# Patient Record
Sex: Female | Born: 1960 | Race: Black or African American | Hispanic: No | Smoking: Former smoker
Health system: Southern US, Community
[De-identification: ages and names within clinical notes are randomized; demographics above are authoritative.]

## PROBLEM LIST (undated history)

## (undated) DIAGNOSIS — E669 Obesity, unspecified: Secondary | ICD-10-CM

## (undated) DIAGNOSIS — T50995A Adverse effect of other drugs, medicaments and biological substances, initial encounter: Secondary | ICD-10-CM

## (undated) DIAGNOSIS — I10 Essential (primary) hypertension: Secondary | ICD-10-CM

## (undated) DIAGNOSIS — M1990 Unspecified osteoarthritis, unspecified site: Secondary | ICD-10-CM

## (undated) DIAGNOSIS — G4730 Sleep apnea, unspecified: Secondary | ICD-10-CM

## (undated) DIAGNOSIS — Z87891 Personal history of nicotine dependence: Secondary | ICD-10-CM

## (undated) DIAGNOSIS — E781 Pure hyperglyceridemia: Secondary | ICD-10-CM

## (undated) DIAGNOSIS — I214 Non-ST elevation (NSTEMI) myocardial infarction: Secondary | ICD-10-CM

## (undated) DIAGNOSIS — E039 Hypothyroidism, unspecified: Secondary | ICD-10-CM

## (undated) DIAGNOSIS — E119 Type 2 diabetes mellitus without complications: Secondary | ICD-10-CM

## (undated) DIAGNOSIS — I2510 Atherosclerotic heart disease of native coronary artery without angina pectoris: Secondary | ICD-10-CM

## (undated) HISTORY — PX: TUBAL LIGATION: SHX77

## (undated) HISTORY — PX: VESICOVAGINAL FISTULA CLOSURE W/ TAH: SUR271

## (undated) HISTORY — DX: Sleep apnea, unspecified: G47.30

## (undated) HISTORY — DX: Essential (primary) hypertension: I10

---

## 1997-05-18 ENCOUNTER — Other Ambulatory Visit: Admission: RE | Admit: 1997-05-18 | Discharge: 1997-05-18 | Payer: Self-pay | Admitting: *Deleted

## 1997-05-26 ENCOUNTER — Ambulatory Visit (HOSPITAL_COMMUNITY): Admission: RE | Admit: 1997-05-26 | Discharge: 1997-05-26 | Payer: Self-pay | Admitting: *Deleted

## 1997-12-20 ENCOUNTER — Emergency Department (HOSPITAL_COMMUNITY): Admission: EM | Admit: 1997-12-20 | Discharge: 1997-12-20 | Payer: Self-pay | Admitting: Emergency Medicine

## 2000-01-17 ENCOUNTER — Emergency Department (HOSPITAL_COMMUNITY): Admission: EM | Admit: 2000-01-17 | Discharge: 2000-01-17 | Payer: Self-pay | Admitting: *Deleted

## 2001-01-12 ENCOUNTER — Emergency Department (HOSPITAL_COMMUNITY): Admission: EM | Admit: 2001-01-12 | Discharge: 2001-01-12 | Payer: Self-pay

## 2002-02-14 ENCOUNTER — Emergency Department (HOSPITAL_COMMUNITY): Admission: EM | Admit: 2002-02-14 | Discharge: 2002-02-14 | Payer: Self-pay | Admitting: Emergency Medicine

## 2002-09-10 ENCOUNTER — Emergency Department (HOSPITAL_COMMUNITY): Admission: EM | Admit: 2002-09-10 | Discharge: 2002-09-11 | Payer: Self-pay | Admitting: Emergency Medicine

## 2003-08-19 ENCOUNTER — Inpatient Hospital Stay (HOSPITAL_COMMUNITY): Admission: EM | Admit: 2003-08-19 | Discharge: 2003-08-26 | Payer: Self-pay | Admitting: Obstetrics and Gynecology

## 2003-08-19 ENCOUNTER — Encounter: Payer: Self-pay | Admitting: Emergency Medicine

## 2003-08-20 ENCOUNTER — Encounter (INDEPENDENT_AMBULATORY_CARE_PROVIDER_SITE_OTHER): Payer: Self-pay | Admitting: Specialist

## 2003-09-01 ENCOUNTER — Ambulatory Visit (HOSPITAL_COMMUNITY): Admission: RE | Admit: 2003-09-01 | Discharge: 2003-09-01 | Payer: Self-pay | Admitting: Obstetrics and Gynecology

## 2003-09-15 ENCOUNTER — Ambulatory Visit (HOSPITAL_COMMUNITY): Admission: RE | Admit: 2003-09-15 | Discharge: 2003-09-15 | Payer: Self-pay | Admitting: Obstetrics and Gynecology

## 2003-10-17 ENCOUNTER — Encounter (INDEPENDENT_AMBULATORY_CARE_PROVIDER_SITE_OTHER): Payer: Self-pay | Admitting: Specialist

## 2003-10-17 ENCOUNTER — Inpatient Hospital Stay (HOSPITAL_COMMUNITY): Admission: RE | Admit: 2003-10-17 | Discharge: 2003-10-20 | Payer: Self-pay | Admitting: Obstetrics and Gynecology

## 2003-10-21 ENCOUNTER — Observation Stay (HOSPITAL_COMMUNITY): Admission: AD | Admit: 2003-10-21 | Discharge: 2003-10-22 | Payer: Self-pay | Admitting: Obstetrics and Gynecology

## 2003-12-12 ENCOUNTER — Ambulatory Visit (HOSPITAL_COMMUNITY): Admission: RE | Admit: 2003-12-12 | Discharge: 2003-12-12 | Payer: Self-pay | Admitting: Obstetrics and Gynecology

## 2005-01-31 ENCOUNTER — Ambulatory Visit (HOSPITAL_COMMUNITY): Admission: RE | Admit: 2005-01-31 | Discharge: 2005-01-31 | Payer: Self-pay | Admitting: Obstetrics and Gynecology

## 2006-02-12 ENCOUNTER — Ambulatory Visit (HOSPITAL_COMMUNITY): Admission: RE | Admit: 2006-02-12 | Discharge: 2006-02-12 | Payer: Self-pay | Admitting: Internal Medicine

## 2006-02-21 ENCOUNTER — Encounter: Admission: RE | Admit: 2006-02-21 | Discharge: 2006-02-21 | Payer: Self-pay | Admitting: Internal Medicine

## 2007-02-05 HISTORY — PX: KNEE ARTHROSCOPY W/ MENISCAL REPAIR: SHX1877

## 2007-06-04 ENCOUNTER — Encounter: Admission: RE | Admit: 2007-06-04 | Discharge: 2007-06-04 | Payer: Self-pay | Admitting: Orthopedic Surgery

## 2007-06-08 ENCOUNTER — Ambulatory Visit (HOSPITAL_COMMUNITY): Admission: RE | Admit: 2007-06-08 | Discharge: 2007-06-08 | Payer: Self-pay | Admitting: Internal Medicine

## 2008-06-21 ENCOUNTER — Ambulatory Visit (HOSPITAL_COMMUNITY): Admission: RE | Admit: 2008-06-21 | Discharge: 2008-06-21 | Payer: Self-pay | Admitting: Internal Medicine

## 2009-06-28 ENCOUNTER — Ambulatory Visit (HOSPITAL_COMMUNITY): Admission: RE | Admit: 2009-06-28 | Discharge: 2009-06-28 | Payer: Self-pay | Admitting: Internal Medicine

## 2010-02-25 ENCOUNTER — Encounter: Payer: Self-pay | Admitting: Internal Medicine

## 2010-06-22 ENCOUNTER — Emergency Department (HOSPITAL_COMMUNITY): Payer: 59

## 2010-06-22 ENCOUNTER — Emergency Department (HOSPITAL_COMMUNITY)
Admission: EM | Admit: 2010-06-22 | Discharge: 2010-06-22 | Disposition: A | Discharge status: Home / Self Care | Payer: 59 | Attending: Emergency Medicine | Admitting: Emergency Medicine

## 2010-06-22 DIAGNOSIS — I10 Essential (primary) hypertension: Secondary | ICD-10-CM | POA: Insufficient documentation

## 2010-06-22 DIAGNOSIS — R42 Dizziness and giddiness: Secondary | ICD-10-CM | POA: Insufficient documentation

## 2010-06-22 DIAGNOSIS — R0602 Shortness of breath: Secondary | ICD-10-CM | POA: Insufficient documentation

## 2010-06-22 DIAGNOSIS — J189 Pneumonia, unspecified organism: Secondary | ICD-10-CM | POA: Insufficient documentation

## 2010-06-22 DIAGNOSIS — H538 Other visual disturbances: Secondary | ICD-10-CM | POA: Insufficient documentation

## 2010-06-22 DIAGNOSIS — R51 Headache: Secondary | ICD-10-CM | POA: Insufficient documentation

## 2010-06-22 LAB — URINALYSIS, ROUTINE W REFLEX MICROSCOPIC
Bilirubin Urine: NEGATIVE
Ketones, ur: NEGATIVE mg/dL
Leukocytes, UA: NEGATIVE
Nitrite: NEGATIVE
Protein, ur: NEGATIVE mg/dL
Specific Gravity, Urine: 1.025 (ref 1.005–1.030)
pH: 5.5 (ref 5.0–8.0)

## 2010-06-22 LAB — BASIC METABOLIC PANEL
Calcium: 9.4 mg/dL (ref 8.4–10.5)
Chloride: 101 mEq/L (ref 96–112)
Creatinine, Ser: 0.99 mg/dL (ref 0.4–1.2)
GFR calc Af Amer: >60 mL/min (ref >60)
GFR calc non Af Amer: 59 mL/min — ABNORMAL LOW (ref >60)
Glucose, Bld: 179 mg/dL — ABNORMAL HIGH (ref 70–99)
Potassium: 4.4 mEq/L (ref 3.5–5.1)

## 2010-06-22 LAB — CBC
HCT: 43.8 % (ref 36.0–46.0)
Hemoglobin: 14.5 g/dL (ref 12.0–15.0)
MCH: 28.7 pg (ref 26.0–34.0)
MCV: 86.6 fL (ref 78.0–100.0)
RBC: 5.06 MIL/uL (ref 3.87–5.11)

## 2010-06-22 LAB — POCT CARDIAC MARKERS

## 2010-06-22 LAB — DIFFERENTIAL
Basophils Relative: 0 % (ref 0–1)
Lymphs Abs: 3 10*3/uL (ref 0.7–4.0)
Monocytes Absolute: 1.3 10*3/uL — ABNORMAL HIGH (ref 0.1–1.0)
Monocytes Relative: 12 % (ref 3–12)

## 2010-06-22 LAB — URINE MICROSCOPIC-ADD ON

## 2010-06-22 MED ORDER — IOHEXOL 300 MG/ML  SOLN
100.0000 mL | Freq: Once | INTRAMUSCULAR | Status: AC | PRN
  Administered 2010-06-22: 100 mL via INTRAVENOUS

## 2010-06-22 NOTE — H&P (Signed)
[name], [name]                          ACCOUNT NO.:  [id number]   MEDICAL RECORD NO.:  [id number]                   PATIENT TYPE:  INP   LOCATION:  NA                                   FACILITY:  [facility]   PHYSICIAN:  Hal Morales, M.D.             DATE OF BIRTH:  04-15-1960   DATE OF ADMISSION:  DATE OF DISCHARGE:                                HISTORY & PHYSICAL   HISTORY OF PRESENT ILLNESS:  [name] is a 50 year old married African-  American female para 3-0-0-3 who presents for hysterectomy because of pelvic  pain and a persistent right adnexal abscess.  The patient entered the  service of [facility] through [facility]  emergency department when she was seen by Dr. Pennie Rushing on August 19, 2003 for a  right adnexal abscess.  This abscess which grew E. coli responded well to a  7-day course of Zosyn IV and a CT-directed abscess drainage (a pigtail drain  was left in place).  The patient's initial CT scan on August 19, 2003 showed a  6 x 8 cm abscess in her right adnexal region which was drained under CT  guidance.  A follow-up CT scan on September 01, 2003 showed no residual fluid or  pus in the right lower quadrant.  Additionally, a water-soluble contrast was  injected under fluoroscopy into her abscess cavity showing no communication  with the bowel, but rather suggested a tuboovarian etiology.  It was also  noted that a soft tissue density surrounding the indwelling pigtail catheter  was visible.  A final CT scan on September 15, 2003 confirmed that there was no  residual fluid collection or acute inflammatory process; therefore, the  drainage catheter was removed.  The patient remained asymptomatic until  approximately 2 weeks ago when she began experiencing what she described as  twinges of pain in her right lower quadrant.  The patient states that this  discomfort has now evolved into intermittent, daily, achy/sharp pain rated  at a 5/10 on a  10-point scale.  The patient does find relief, however, by  taking two Vicodin tablets and denies any nausea, vomiting, diarrhea, fever,  dyspareunia, urinary tract symptoms, or back pain associated with this  discomfort.  The patient does admit at today's visit to a heavy vaginal  discharge with odor and mild itching of 3-day duration.  During the  patient's September 14, 2003 office visit a 30-minute discussion was held with  the patient regarding the possibility of reaccumulation of her abscess and  that should that occur, a total abdominal hysterectomy with right salpingo-  oophorectomy and possibly bilateral salpingo-oophorectomy might be required.  Given the patient's recurring symptoms and her desire for definitive  therapy, she has consented to undergo hysterectomy.   PAST MEDICAL HISTORY:  1.  OB history:  Gravida 3, para 3-0-0-3.  The patient delivered vaginally  in 1985, 1983, and 1980.  She had no problems with her pregnancies.  2.  GYN history:  Menarche at 50 years old.  The patient's menstrual periods      are regular.  Her last menstrual period was August 20, 2003.  She uses      bilateral tubal ligation as her method of contraception.  Denies any      history of sexually-transmitted diseases or abnormal Pap smears.  Her      last normal Pap smear and mammogram were both in 2004.  3.  Medical history:  Febrile seizures as a child.  4.  Surgical history:  Hernia repair as an infant on two separate occasions.      In 1986 - bilateral tubal ligation (an open procedure as opposed to      laparoscopy due to a tubal abnormality per the patient).   FAMILY HISTORY:  Positive for diabetes mellitus and hypertension.   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   HABITS:  Tobacco:  The patient smokes three cigarettes per day.  Alcohol:  Occasionally.   CURRENT MEDICATIONS:  Hydrocodone one to two tablets q.4-6h. as  needed for  pain.   ALLERGIES:  CODEINE, which causes itching.   REVIEW OF SYSTEMS:  The patient wears glasses, vaginitis symptoms, late  menstrual periods, and otherwise negative except as mentioned in the history  of present illness.   PHYSICAL EXAMINATION:  GENERAL:  This is a morbidly-obese African-American  female in no acute distress.  VITAL SIGNS:  Blood pressure 130/78, weight is 336, height is 5 feet 8.5  inches tall.  NECK:  Supple.  There are no masses, thyromegaly, or lymphadenopathy.  HEART:  Regular rate and rhythm.  The patient does have a 1/6 systolic  ejection murmur at the left sternal border (asymptomatic).  LUNGS:  Clear to auscultation.  There are no wheezes, rales, or rhonchi.  BACK:  No CVA tenderness.  ABDOMEN:  Bowel sounds are present.  It is soft.  The patient does have  right lower quadrant tenderness without guarding or rebound.  EXTREMITIES:  Without clubbing, cyanosis, or edema.  PELVIC:  EG/BUS is within normal limits though the patient does have a 0.5  cm skin tag on her left vulvar area.  Vagina is normal with copious amounts  of thin, gray, foul-smelling vaginal discharge.  Cervix is without  tenderness or lesions.  Uterus unable to palpate secondary to increased BMI  though the patient does complain of tenderness with this exam.  Adnexa  without any palpable masses or tenderness.  Rectovaginal without masses or  tenderness.   LABORATORY DATA:  Urine pregnancy test is negative.  Wet prep shows pH is  5.5, positive whiff, positive for clue cells.   IMPRESSION:  1.  Pelvic pain.  2.  Right adnexal abscess.  3.  Bacterial vaginosis.   DISPOSITION:  A discussion was held with the patient regarding the  implications for her procedure along with its risks which include but are  not limited to:  Reaction to anesthesia, excessive bleeding, infection, damage to adjacent organs, and deep vein thrombosis due to the patient's  increased body mass index.   The patient was given metronidazole 500 mg one  tablet twice daily for 5 days prior to her surgical procedure for bacterial  vaginosis, along with alcohol precautions.  The patient has verbalized  understanding of the risks of  her procedure and has accepted them by  consenting to proceed with a total abdominal hysterectomy with possible  right salpingo-oophorectomy/bilateral salpingo-oophorectomy at [facility] on October 17, 2003 at 10:45 a.m.     [name].                    Hal Morales, M.D.    EJP/MEDQ  D:  10/13/2003  T:  10/13/2003  Job:  [id number]

## 2010-06-22 NOTE — Consult Note (Signed)
NAMECARMEL, GARFIELD                          ACCOUNT NO.:  [id number]   MEDICAL RECORD NO.:  [id number]                   PATIENT TYPE:  EMS   LOCATION:  ED                                   FACILITY:  [facility]   PHYSICIAN:  Ollen Gross. Vernell Morgans, M.D.              DATE OF BIRTH:  Jun 10, 1960   DATE OF CONSULTATION:  08/19/2003  DATE OF DISCHARGE:                                   CONSULTATION   Ms. Grams is a 50 year old black female who presents with right flank pain  that started on Tuesday.  Her pain was associated with fevers to 104 at  home.  Her pain eventually moved to the left lower quadrant and then to her  right lower quadrant.  The pain has been associated with nausea, but no  vomiting.  She did have a bowel movement yesterday that was normal, but none  today.  She is sexually active with her husband only.  Pain is never really  improved and she came to the emergency department for further evaluation.  She, otherwise, denies chest pain, shortness of breath, diarrhea, dysuria.  Rest of review of systems unremarkable.   PAST MEDICAL HISTORY:  None.   PAST SURGICAL HISTORY:  Significant for an open bilateral tubal ligation  that she states is because her right tube was abnormal.   MEDICATIONS:  None.   ALLERGIES:  CODEINE.   SOCIAL HISTORY:  She denies use of alcohol and smokes about five cigarettes  a day.   FAMILY HISTORY:  Noncontributory.   PHYSICAL EXAMINATION:  VITAL SIGNS:  Temperature 102.5, blood pressure  150/80, pulse 120.  GENERAL:  She is a well-developed, well-nourished black female in no acute  distress.  SKIN:  Warm and dry with no jaundice.  HEENT:  Eyes:  Her extraocular muscles intact.  Pupils are equal, round, and  reactive to light.  Sclerae nonicteric.  LUNGS:  Clear bilaterally with no use of accessory respiratory muscles.  HEART:  Regular rate and rhythm with an impulse in the left chest.  ABDOMEN:  Soft with some focal right lower quadrant  tenderness, but no  guarding or peritoneal signs.  No palpable mass, although she is morbidly  obese.  EXTREMITIES:  No clubbing, cyanosis, edema.  PSYCHOLOGIC:  Is alert and oriented x3 with no evidence of any  anxiety/depression.   LABORATORIES:  On review of her laboratory work it was significant for a  white count of 22,000.  On review of her CT scan with the radiologist she  has a complex cystic mass in her right adnexal area very closely associated  with her uterus.  The tip of the appendix is visualized above this and  appears normal, although the entire course of the appendix cannot be seen.  Ultrasound was also performed and was reviewed with the radiologist and was  significant for a complex right adnexal cystic mass with internal flow.  ASSESSMENT/PLAN:  This is a 49 year old black female with a complex cystic  right adnexal mass as well as elevated white count and temperature.  This  seems most worrisome, based on her studies, for a tubo ovarian abscess;  although this can be unusual in a married woman that has had a tubal  ligation in the past.  She is known to have an abnormal right tube based on  her history.  She certainly also could have appendicitis, although I also  think this is less likely given the fact that we can see part of the  appendix on the CT scan and it appears normal.  We will plan to consult GYN,  Dr. Pennie Rushing, to get their opinion and I agree with starting her on broad-  spectrum antibiotic therapy and we will be available to help with any  surgery should it become necessary and will follow closely with the GYNs.                                               Ollen Gross. Vernell Morgans, M.D.    PST/MEDQ  D:  08/19/2003  T:  08/19/2003  Job:  [id number]

## 2010-06-22 NOTE — Op Note (Signed)
[name], [name]                          ACCOUNT NO.:  [id number]   MEDICAL RECORD NO.:  [id number]                   PATIENT TYPE:  INP   LOCATION:  9320                                 FACILITY:  [facility]   PHYSICIAN:  Hal Morales, M.D.             DATE OF BIRTH:  08-30-1960   DATE OF PROCEDURE:  10/17/2003  DATE OF DISCHARGE:                                 OPERATIVE REPORT   PREOPERATIVE DIAGNOSES:  1.  Pelvic pain.  2.  Morbid obesity.  3.  Right tuboovarian complex, status post right tuboovarian abscess.  4.  Uterine fibroids.  5.  Pelvic adhesions.   POSTOPERATIVE DIAGNOSES:  1.  Pelvic pain.  2.  Morbid obesity.  3.  Right tuboovarian complex, status post right tuboovarian abscess.  4.  Uterine fibroids.  5.  Pelvic adhesions.   OPERATION:  1.  Total abdominal hysterectomy.  2.  Right salpingo-oophorectomy.  3.  Lysis of adhesions.   SURGEON:  Dr. Dierdre Forth   FIRST ASSISTANT:  Dr. Marsh Dolly   ANESTHESIA:  General orotracheal.   ESTIMATED BLOOD LOSS:  700 mL.   FLUIDS:  4900 mL of crystalloid.   URINE OUTPUT:  300 mL.   COMPLICATIONS:  None.   FINDINGS:  The patient weighed 342 pounds and is 5 feet 8 inches tall.  The  uterus was enlarged to 12 weeks' size with multiple subserosal uterine  fibroids and one pedunculated fibroid on the left anterior uterus measuring  approximately 2 cm.  The left ovary was within normal limits except for  minimal filmy adhesions.  The left tube was status post tubal sterilization.  The right tube and ovary were involved in an adhesive complex measuring 8 x  5 cm, containing clear fluid, and no purulent fluid was noted.  There were  numerous adhesions between this complex mass of the right pelvic sidewall.  There were also adhesions between the anterior abdominal wall and the  omentum.   SPECIMENS TO PATHOLOGY:  Uterus, cervix, and right tuboovarian complex.   DESCRIPTION OF PROCEDURE:  The patient  was taken to the operating room after  appropriate identification and placed on the operating table.  After the  attainment of adequate general anesthesia, with the patient in the supine  position, the abdomen was prepped with multiple layers of Betadine, and the  perineum and vagina were then prepped with multiple layers of Betadine.  A  Foley catheter was inserted into the bladder and connected to straight  drainage.  The abdomen was draped as a sterile field.  A midline incision  was made in the abdomen and the abdomen opened in layers.  The patient was  noted to have approximately 12 cm of subcutaneous tissue.  The fascia was  incised in the midline and that incision taken cephalad and caudad.  The  peritoneum was entered and that incision taken cephalad and caudad.  Lysis  of adhesions was carried out to remove some of the adhesions between the  anterior abdominal wall and the tuboovarian complex and the omentum.  After  this had been achieved, a self-retaining retractor could be placed and a  bladder blade placed.  The right adnexal region was noted to be markedly  adherent, and lysis of adhesions to allow the restoration of more  recognizable anatomy was undertaken.  The uterus was then grasped at each  cornual region with [facility] clamps and elevated into the operative field.  The  left round ligament was then isolated, suture ligated, and incised.  The  anterior leaf of the broad ligament was incised anteriorly toward the  bladder.  The uteroovarian ligament and upper pedicle were then identified,  clamped, cut, tied with a free tie, and suture ligated.  On the right side,  the round ligament could not easily be identified so with further sharp and  blunt dissection, an area consistent with the round ligament was elevated  and suture ligated and incised.  The upper pedicle and area in the position  of the uteroovarian ligament was then clamped, cut, and suture ligated.  This procedure  was repeated down to the level of the right uterine artery  being careful to maintain a position very close to the uterus.  The left  uterine artery was then skeletonized, clamped, cut, and suture ligated and  the right uterine artery clamped, cut, and suture ligated.  Further lysis of  adhesions allowed the right adnexal mass to be freed up from the right  uterine fundus.  The fundus of the uterus was then excised range of motion  the upper cervix and the fundus removed from the operative field.  The  paracervical tissues were clamped, cut, and suture ligated on either side.  The uterosacral ligaments on the right and left side were clamped, cut, and  suture ligated and those sutures held.  The vaginal angle sutures were then  clamped, cut, and suture ligated and the cervix excised from the upper  vagina.  The vaginal cuff was closed with figure-of-eight sutures of 0  Vicryl.  All sutures to this point had been 0 Vicryl.  Copious irrigation  was carried out and a hemostatic suture placed in the left vaginal cuff.  Attention was then turned to the right adnexa, where the right adnexal mass  was elevated into the operative field.  The sidewall peritoneum was then  incised and taken down sharply.  This allowed the mass to be isolated after  considerable lysis of adhesions.  Once the mass had been dissected off the  right pelvic sidewall, the ureter was visualized.  The dissection was then  taken back for the infundibulopelvic ligament which was subsequently  isolated, clamped, cut, tied with a free tie, and suture ligated.  The  portion of the right adnexal mass that was adherent more caudad was then  gently dissected off the right pelvic sidewall with sharp dissection, being  careful of the right ureter and was at its base clamped, cut, and suture  ligated.  Copious irrigation was carried out, and a hemostatic suture was required for what had been the caudad base of the right tuboovarian  complex.  It was then removed from the operative field, and the right pelvic sidewall  made hemostatic with a suture of 3-0 Vicryl.  Copious irrigation was carried  out, and the sutures holding the vaginal angle and uterosacrals on either  side were tied together.  An additional hemostatic suture of 0 Vicryl was  placed in the vaginal cuff and, again, copious irrigation carried out.  It  should be noted that prior to clamping the uterine artery, the bladder was  sharply dissected off the anterior cervix.  Once copious irrigation of  peritoneal cavity had occurred, the abdominal incision was closed in a  modified Smead-Jones fashion with 0 PDS from each apex to the midline and  tied in the midline.  This allowed incorporation of both fascia and  peritoneum in this mass closure.  The subcutaneous tissue was copiously  irrigated and made hemostatic with Bovie cautery.  The subcutaneous tissue  was reapproximated with interrupted sutures of 0 plain.  The skin  incision was reapproximated with skin staples.  A sterile dressing was  applied and the patient awakened from general anesthesia.  She was taken to  the recovery room in satisfactory condition having tolerated the procedure  well with sponge and instrument counts correct.                                               Hal Morales, M.D.    VPH/MEDQ  D:  10/17/2003  T:  10/17/2003  Job:  [id number]

## 2010-06-22 NOTE — Discharge Summary (Signed)
[name], [name]              ACCOUNT NO.:  [id number]   MEDICAL RECORD NO.:  [id number]          PATIENT TYPE:  OBV   LOCATION:  9303                          FACILITY:  [facility]   PHYSICIAN:  Osborn Coho, M.D.   DATE OF BIRTH:  04/13/1960   DATE OF ADMISSION:  10/21/2003  DATE OF DISCHARGE:  10/22/2003                                 DISCHARGE SUMMARY   DISCHARGE DIAGNOSES:  1.  Right leg pain.  2.  Status post total abdominal hysterectomy with a right salpingo-      oophorectomy and lysis of adhesions (postop day #4).   PROCEDURE:  October 22, 2003, venous Doppler studies:  Negative for DVT,  SVT or Baker cyst of right lower extremity.   HISTORY OF PRESENT ILLNESS:  [name] is a 50 year old African-American  female, para 3-0-0-3 who is status post (four days) total abdominal  hysterectomy and right salpingo-oophorectomy with lysis of adhesions for  pelvic pain and persistent right adnexal mass.  The patient developed right  lower extremity pain on postoperative day #4 while at home which extended  toward her femoral area and later progressed to a sensation of numbness and  paresthesias.  The patient was admitted for observation and to rule out deep  venous thrombosis.   HOSPITAL COURSE:  On the date of admission, the patient was admitted, placed  on bed rest with bathroom privileges and given analgesia for her discomfort.  On hospital day #1, she underwent venous Doppler studies which proved to be  negative for deep venous thrombosis and therefore the patient was discharged  home.   DISCHARGE MEDICATIONS:  The patient was advised to continue prehospital  medications.   FOLLOW UP:  The patient is to keep her appointment for abdominal staple  removal at [facility] on October 24, 2003.   DISCHARGE INSTRUCTIONS:  The patient is to follow previously given [facility] postoperative instruction sheet. She was also advised to  call the doctor  for any problems, pain that is not relieved by her pain  medication or any heavy vaginal bleeding. The patient was advised to keep  her incision dry and to shower only. The patient may continue a regular  diet.      EJP/MEDQ  D:  11/02/2003  T:  11/02/2003  Job:  [id number]

## 2010-06-22 NOTE — Discharge Summary (Signed)
NAMESHARREN, SCHNURR              ACCOUNT NO.:  [id number]   MEDICAL RECORD NO.:  [id number]          PATIENT TYPE:  INP   LOCATION:  9320                          FACILITY:  [facility]   PHYSICIAN:  Hal Morales, M.D.DATE OF BIRTH:  1960/06/19   DATE OF ADMISSION:  10/17/2003  DATE OF DISCHARGE:  10/20/2003                                 DISCHARGE SUMMARY   DISCHARGE DIAGNOSES:  1.  Pelvic pain.  2.  Endometriosis.  3.  Uterine fibroids.  4.  Pelvic adhesions.  5.  Right tuboovarian complex.  6.  Status post right adnexal abscess.  7.  Morbid obesity.  8.  Incisional cellulitis.   OPERATION:  On the date of admission, the patient underwent a total  abdominal hysterectomy with a right salpingo-oophorectomy and lysis of  adhesions tolerating all procedures well. The patient was found to have a  uterus enlarged to 10 to 12 week size with fibroids, a normal appearing left  ovary with the left tube being previously ligated, a right tube and ovary  involved in an adhesive complex measuring approximately 8 cm x 5 cm and  adherent to the right uterus and right pelvic sidewall.   HISTORY OF PRESENT ILLNESS:  [name] is a 50 year old, married, African-  American female, para 3-0-0-3 who presents for hysterectomy because of  persistent pelvic pain and a persistent right adnexal abscess. Please see  the patient's dictated history and physical examination for details.   PHYSICAL EXAMINATION:  VITAL SIGNS:  Blood pressure 130/78, weight is 336  pounds, height is 5 feet 8 1/2 inches tall.  GENERAL:  Within normal limits.  ABDOMEN:  Bowel sounds were present, it was soft, the patient had right  lower quadrant tenderness without guarding or rebound.  PELVIC:  EGBUS was within normal limits though the patient does have a 1/2  cm skin tag on her left vulvar area. Vagina is normal with copious amounts  of thin, gray, foul smelling vaginal discharge. The cervix is without  tenderness or  lesions. The uterus was unable to be palpated secondary to  increased body mass index though patient complain of tenderness with this  exam.  Adnexa without any palpable masses or tenderness.  Rectovaginal  without masses or tenderness. Do note that patient was treated for bacterial  vaginosis prior to her surgical procedure.   HOSPITAL COURSE:  On the date of admission, the patient underwent  aforementioned procedures tolerating them all well.  Postoperative course  was marked by a transient complaint of left lateral posterior knee pain and  inflammation of her incision line.  The patient, however, quickly resumed  bowel and bladder function by postoperative day #2 with a postop hemoglobin  of 9.5 (preop hemoglobin 12.1).  By postoperative day #3, the patient had  received the maximal benefit of her hospital stay and was therefore  discharged home on antibiotics for mild incisional cellulitis.   DISCHARGE MEDICATIONS:  1.  Ferrous sulfate 325 mg, 1 tablet twice daily for 6 weeks.  2.  Phenergan 25 mg, 1 tablet every 6 hours as needed for nausea.  3.  Ibuprofen 600 mg, 1 tablet with food every 6 hours for 3 days and then      as needed for pain.  4.  Colace 100 mg, 1 tablet twice daily until bowel movements are regular.  5.  Cipro 500 mg, 1 tablet twice daily for 7 days.  6.  Percocet 5/325 mg, 1-2 tablets every 4-6h as needed for pain.   FOLLOW UP:  The patient is to present herself at [facility] on  October 24, 2003 at 9 a.m. for staple removal. She has a six week  postoperative visit with Dr. Pennie Rushing on November 28, 2003 at 1:30 p.m.   DISCHARGE INSTRUCTIONS:  The patient was given a copy of [facility] postoperative instruction sheet. She was further advised to avoid  driving for two weeks, heavy lifting for four weeks and intercourse for six  weeks. The patient's diet was without restriction.   FINAL PATHOLOGY:  Uterus (hysterectomy):  Benign cervix  with chronic  cervicitis and squamous metaplasia; benign proliferative endometrium;  leiomyomata; and unremarkable cirrhosis. Right ovary and fallopian tube  resection, endometriosis, benign hemorrhagic follicular cyst, tuboovarian  fibrous adhesions, and chronic salpingitis.      EJP/MEDQ  D:  11/02/2003  T:  11/02/2003  Job:  [id number]

## 2010-06-22 NOTE — H&P (Signed)
[name], [name]                          ACCOUNT NO.:  [id number]   MEDICAL RECORD NO.:  [id number]                   PATIENT TYPE:  INP   LOCATION:  9303                                 FACILITY:  [facility]   PHYSICIAN:  Osborn Coho, M.D.                DATE OF BIRTH:  02/11/1960   DATE OF ADMISSION:  10/21/2003  DATE OF DISCHARGE:                                HISTORY & PHYSICAL   HISTORY OF PRESENT ILLNESS:  [name] is a 50 year old gravida 3, para 3-0-  0-3, who is now 4 days status post total abdominal hysterectomy and right  salpingo-oophorectomy, and lysis of adhesions by Dr. Hal Morales on  October 17, 2003.  She had had a long course beginning on August 19, 2003  with a right adnexal abscess which grew E. coli.  She had antibiotics and a  CT-directed abscess drainage; this did improve and a final CT scan on September 15, 2003 confirmed there was no residual fluid and the drainage catheter was  removed at that time.  She then was doing well until approximately 2 weeks  prior to her admission on October 17, 2003, when she began to have pain in  the right lower quadrant.  The decision was made to proceed with a total  abdominal hysterectomy, a right salpingo-oophorectomy, so she was admitted  on October 17, 2003 for that procedure.  During the lengthy procedure, she  was found to have multiple fibroids, multiple adhesions.  Patient tolerated  the procedure well.  Her postop course was remarkable for mild incisional  erythema and redness for which she was placed on Cipro to be extended for 7  days b.i.d. after surgery.   The patient had been doing well until early this morning, approximately 1:30  a.m., at which time she noted pain in her right knee to the inner aspect.  This pain occurred when she moved, it extended up toward her femoral area,  it was described as a pulling or tightening feeling, this did come and go  throughout the day.  By the late afternoon on  October 21, 2003, she began  to also have some tingling and numbness in that same area.  The patient then  presented to the hospital this evening without call with those complaints  noted.   She advises [place] that she has been doing well on Motrin and Percocet with  minimal use of both.  She has been ambulating at home without difficulty.  She is now to be admitted for observation through the night and for the  opportunity to obtain Doppler studies of her right leg on the morning of  October 22, 2003.  While she was in the hospital previously, she did have  a complaint of some pain in her left leg, although this was not determined  to be of issue.   PAST MEDICAL HISTORY:   OBSTETRICAL HISTORY:  She is a gravida 3, para 3-0-0-3, with 3 vaginal  deliveries, 59, 41 and 56.  She had no problems.   GYNECOLOGICAL HISTORY:  Menarche at age 69.  Patient's menstrual periods  were regular; her last menstrual period was August 20, 2003.  She had had a  tubal ligation in the past.  She denies any STDs or abnormal Pap smears and  her last normal Pap smear and mammogram were in 2004.   MEDICAL HISTORY:  She had febrile seizures as a child.   SURGICAL HISTORY:  1.  Hernia repair as an infant on 2 separate occasions.  2.  Bilateral tubal ligation, 1986, which was an open procedure as opposed      to a laparoscopic due to a tubal abnormality per the patient.   FAMILY HISTORY:  Family history remarkable for diabetes and hypertension.   SOCIAL HISTORY:  Patient is married.  Her husband is supportive.  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   HABITS:  The patient smokes 3-5 cigarettes per day.  She has occasional  social alcohol use.   CURRENT MEDICATIONS:  1.  Hydrocodone 1-2 tablets q.4-6 h. p.r.n. for pain.  2.  Motrin 600 mg p.o. q.6 h. p.r.n. pain.   ALLERGIES:  CODEINE, which causes itching.   REVIEW OF SYSTEMS:  The patient wears glasses.  She is having pain  in her  right knee area.  She reports her abdominal incision is doing well with  minimal pain, otherwise, negative.   PHYSICAL EXAM:  GENERAL:  The patient is a morbidly obese African American  female in no acute distress.  VITAL SIGNS:  Vital signs are within normal limits.  Patient is afebrile.  Patient is 5-feet 8-1/2-inches tall.  Weight is approximately 336.  HEENT:  Within normal limits.  LUNGS:  Bilateral breath sounds are clear.  HEART:  Regular rate and rhythm without murmur.  BREASTS:  Breasts are soft and nontender.  ABDOMEN:  Abdomen remarkable for a healing midline incision with staples  intact.  There is an area surrounding the incision of mild erythema; this  area is not particularly tender to the patient; she does report that it is  much more comfortable than it has been or than it was previously.  BACK:  Negative CVA tenderness.  PELVIC:  Exam is deferred.  EXTREMITIES:  Right leg is noted to have a negative Homans sign.  Dorsalis  pedis pulse, popliteal pulse and femoral pulse are all noted and equal  bilaterally.  The patient has good range of motion of both her legs,  however, she does report pain in the right knee to the inner aspect with  movement that extends up to the femoral area.  There is no significant edema  noted or cyanosis.   LABORATORY DATA:  None.   IMPRESSION:  1.  Four days status post total abdominal hysterectomy and right salpingo-      oophorectomy, and lysis of adhesions.  2.  Right leg pain, rule out deep venous thrombosis.   PLAN:  1.  The patient is admitted to the [facility] per      consult with Dr. Osborn Coho as attending physician.  2.  Bedrest with bathroom privileges, although the patient is to avoid      excess ambulation.  3.  Doppler flow studies will be obtained on the right leg on the morning of      October 22, 2003, as  soon as those resources are available; the     patient understands she will be  transferred to likely [facility] for this procedure.  4.  Medications:  Motrin 600 mg p.o. q.6 h. p.r.n.; Tylox 1-2 p.o. q.3-4 h.      p.r.n. pain; Cipro 500 mg 1 p.o. b.i.d. will be continued.  5.  M.D.s will follow.     Renaldo Reel Emilee Hero, C.N.M.                   Osborn Coho, M.D.    VLL/MEDQ  D:  10/22/2003  T:  10/22/2003  Job:  [id number]

## 2010-06-22 NOTE — Discharge Summary (Signed)
[name], [name]                          ACCOUNT NO.:  [id number]   MEDICAL RECORD NO.:  [id number]                   PATIENT TYPE:  INP   LOCATION:  9308                                 [facility]ILITY[facility]  [facility]   PHYSICIAN:  Dois Davenport A. Rivard, M.D.              DATE OF BIRTH:  1960-04-23   DATE OF ADMISSION:  08/19/2003  DATE OF DISCHARGE:                                 DISCHARGE SUMMARY   ADMITTING DIAGNOSES:  1. Febrile illness and right lower quadrant pain with leukocytosis in a 50-     year-old patient status post tubal ligation.  2. Right adnexal mass.   DISCHARGE DIAGNOSES:  1. Tuboovarian abscess status post percutaneous drainage.  2. Probable glucose intolerance.  3. Morbid obesity.   PROCEDURES:  Radiologic drainage of the abscess status post percutaneous  drainage.   HOSPITAL COURSE:  [name] is a 50 year old married black female para 3-0-  0-3 who was admitted to [facility] on August 19, 2003 status post  evaluation at the [facility] emergency room for 2 days of right lower  quadrant pain.  Assessment was of a febrile illness with temperature of 102,  right lower quadrant pain, leukocytosis, and radiologic studies consistent  with a right adnexal mass.  Dr. Marla Roe from surgery had seen the patient  at [facility] and felt that an appendicitis with abscess was not likely.  Infectious disease consultation was obtained and the patient was admitted  for IV antibiotics.   Her history had been remarkable for normal menses, tubal ligation in 1986,  hernia repair x2, three vaginal deliveries.  There was a family history of  diabetes.  She was a quarter-pack-per-day smoker.  Dr. Pennie Rushing saw the  patient and accepted responsibility for her care.  She was placed originally  on Cipro and Flagyl.  This was changed to Zosyn by infectious disease.  White blood cell count went to approximate maximum of 26; however, she  remained afebrile.  Radiology was consulted and the  patient was scheduled  for a percutaneous drainage of the adnexal mass.  This was done at [facility].  Wound culture showed E. coli.  The patient had onset of her menses  during the time of her hospital course.  Her white blood cell count the day  after surgery was 23.  She was placed on potassium supplement for some  hypokalemia; this did resolve.  Repeat CT scan on July 20 showed a decrease  in size of the mass by approximately half.  The patient continued to have  some pain and the mass still remained 4.8 x 4.3.  The decision was made on  July 22 to consider discharge.  The patient was feeling significantly  better.  She was desiring to go home.  She was up ad lib, voiding, and  having normal bowel movements.  She was having no pain.  She had been  afebrile for greater than 48 hours.  The drain over the last 3 days had  drained less than 20 mL per day.  The patient was deemed to have received  the full benefit of her hospital stay and was discharged home.   DISCHARGE INSTRUCTIONS:  The patient was instructed how to observe for any  problems with the drain.  She is to continue with increased rest and fluids.   DISCHARGE MEDICATIONS:  1. Cipro 750 mg p.o. b.i.d. x14 days.  2. Vicodin one to two tablets q.4h. for pain.   Discharge instructions also will include watching for elevated temperature,  increased abdominal pain, or increased purulent discharge from the drain.  Discharge follow-up will occur with Dr. Pennie Rushing at [facility] on  Tuesday, August 30, 2003 at 9:45 a.m. or p.r.n.     Renaldo Reel Emilee Hero, C.N.M.                   Crist Fat Rivard, M.D.    Leeanne Mannan  D:  08/26/2003  T:  08/26/2003  Job:  [id number]   cc:   Valinda Hoar #[phone] FAX to [facility]

## 2010-06-22 NOTE — H&P (Signed)
[name], CURD                          ACCOUNT NO.:  [id number]   MEDICAL RECORD NO.:  [id number]                   PATIENT TYPE:  INP   LOCATION:  9308                                 [facility]   PHYSICIAN:  Hal Morales, M.D.             DATE OF BIRTH:  1960/10/25   DATE OF ADMISSION:  08/19/2003  DATE OF DISCHARGE:                                HISTORY & PHYSICAL   HISTORY OF PRESENT ILLNESS:  The patient is a 50 year old black married  female, para 3-0-0-3, who presented to the [facility] emergency room at  approximately 11 p.m. on August 18, 2003 complaining of a 2-day history of  right lower quadrant pain without nausea or vomiting, but with loss of  appetite.  She also complained of urinary frequency and urgency  incontinence.  She denied any dysuria.  She initially thought that she had a  urinary tract infection, but said that in the past she had noted changes in  the color of her urine, which she did not note at this time.  The pain was  rated at 10/10 at its worst, but usually only occurred if she tried to  change position or needed to go to relieve her bladder.  She denied any  constipation or diarrhea.  Her last bowel movement was August 17, 2003, though  she usually has a daily bowel movement. Her last meal was August 17, 2003.   She also complained of a fever with a temperature as high as 104 at home.  She denied any headache, neck stiffness, sore throat, chest pain, shortness  of breath, or leg pain.  She also denies any dyspareunia, though she has not  had intercourse for 2 months.   Her last menstrual period was July 24, 2003.  Her menses are monthly,  usually lasting to approximately 4 days; however, her menses on July 24, 2003 lasted for only 2 days.  Her contraception is tubal ligation, which was  done in 1986.   PAST HISTORY:   OBSTETRICAL:  The patient had 3 vaginal deliveries in 1985, 1983, and 1980.  She had no problems with her  pregnancies.   GYNECOLOGIC HISTORY:  Normal menses.  Her last mammogram was in 2004.  She  has never had an abnormal Pap smear.  Her last Pap smear was about 5 months  ago at [facility], and was said to be normal.   CONTRACEPTION:  Tubal ligation.   PAST MEDICAL HISTORY:  The patient specifically denies hypertension or  diabetes.  She did have febrile seizures as a child.   SURGICAL HISTORY:  1. Hernia repair on 2 occasions in 1986.  2. Bilateral tubal ligation in 1986, complicated by the need to do an open     tubal ligation rather than laparoscopic because of an abnormal tube.   SOCIAL HISTORY:  The patient works at [employer] in  [place].  She lives with  her husband and 2 of her children.  She uses alcohol only socially, and  smokes 5 cigarettes a day, and has for the last 27 years.   REVIEW OF SYSTEMS:  Negative, except as mentioned above.   PHYSICAL EXAMINATION:  GENERAL:  The patient is an obese black female in  mild abdominal distress.  VITAL SIGNS:  Her temperature is 102, pulse is 92 and regular, respirations  20, blood pressure 148/78.  LUNGS:  Clear.  HEART:  Regular rate and rhythm.  NECK:  Full range of motion.  ABDOMEN:  Soft, but tender to deep palpation in the right lower quadrant  more than in the left.  There is no rebound.  Bowel sounds are normal.  PELVIC:  EGBUS shows a 2 cm left vulvar skin tag at the 4 o'clock position  on the perineum.  The vagina shows a small amount of creamy discharge.  The  cervix is tender to lateral motion.  The uterus cannot be delineated  secondary to the patient's BMI.  Adnexae - no masses were palpated, but  there was tenderness in the right adnexa greater than in the left.  RECTOVAGINAL:  No masses.  EXTREMITIES:  No clubbing, cyanosis, or edema.   LABORATORY DATA:  Hemoglobin of 11.7, white blood cell count 22,000, with  85% neutrophils, 8% lymphocytes, 7% monocytes.  No eosinophils or basophils.  The  urinalysis was negative.  The complete comprehensive metabolic panel  showed a sodium of 138, potassium of 3.5, chloride of 105, carbon dioxide of  27, and glucose of 161, BUN 7, creatinine 1.2, calcium 8.7, protein 7.2,  albumin 3.1.  SGOT was 23, SGPT was 20, alkaline phosphatase of 62,  bilirubin 0.7, all essential within normal limits, except for mild elevation  of the glucose.  The wet prep showed no yeast, Trichomonas, or clue cells.  Urine pregnancy test was negative.   RADIOLOGIC STUDIES:  An ultrasound with an 8 x 9 cm complex cystic right  adnexal structure with internal flow, nonspecific, with a differential  diagnosis of neoplasm versus inflammatory infectious process.  The uterus  and left ovary appeared unremarkable.  A pelvic CT scan showed a 6 x 8 cm  right adnexal mass with the differential diagnosis of a torsed ovary,  ovarian vein thrombosis, ovarian neoplasm, or inflammatory phlegmon, versus  occult appendicitis.  There was a stranding above the mass, and the appendix  could not be discretely identified, so appendicitis would not be completely  ruled out.   CONSULTATIONS:  A surgical consultation was obtained because of the  possibility of an appendiceal abscess.  Dr. Marla Roe from general surgery  saw the patient, and felt that an appendicitis with abscess was not likely.   ASSESSMENT:  1. Febrile illness and right lower quadrant pain with leukocytosis in a 39-     year-old status post tubal ligation.  2. Radiologic studies consistent with a right adnexal mass, possibly     inflammatory in nature, though this cannot be certain, and ovarian lesion     is not completely ruled out.   DISPOSITION:  1. The patient is admitted to the hospital for intravenous antibiotics, and     an attempt will be made to percutaneously drain the complex mass for     culture and the possibility of a clinical improvement, in addition to the    intravenous antibiotics.  2. An  infectious disease consultation was obtained to maximize antibiotic  coverage.  3. Will monitor the patient's clinical and laboratory status for the     possible need for exploratory laparotomy.  I have had a detailed     discussion with the patient concerning her presentation, the uncertainty     of her diagnosis, and the possibility that surgical intervention would be     required.  I have explained the risks from surgery as anesthetic risks,     bleeding, infection which would be exacerbated, and damage to adjacent     organs, particularly in the presence of infection.  She verbalized     understanding, and had no further questions.  She understands that we     will try medical management initially, but that surgical management could     become necessary.  Dr. Carolynne Edouard has said that he will be available should she     need surgical intervention so that should this represent an appendiceal     abscess, that could likewise be managed at the same time.                                               Hal Morales, M.D.    VPH/MEDQ  D:  08/19/2003  T:  08/19/2003  Job:  [id number]

## 2010-07-09 ENCOUNTER — Other Ambulatory Visit (HOSPITAL_COMMUNITY): Payer: Self-pay | Admitting: Internal Medicine

## 2010-07-09 DIAGNOSIS — Z1231 Encounter for screening mammogram for malignant neoplasm of breast: Secondary | ICD-10-CM

## 2010-07-18 ENCOUNTER — Ambulatory Visit (HOSPITAL_COMMUNITY)
Admission: RE | Admit: 2010-07-18 | Discharge: 2010-07-18 | Disposition: A | Discharge status: Home / Self Care | Payer: 59 | Source: Ambulatory Visit | Attending: Internal Medicine | Admitting: Internal Medicine

## 2010-07-18 DIAGNOSIS — Z1231 Encounter for screening mammogram for malignant neoplasm of breast: Secondary | ICD-10-CM | POA: Insufficient documentation

## 2010-08-07 ENCOUNTER — Ambulatory Visit (INDEPENDENT_AMBULATORY_CARE_PROVIDER_SITE_OTHER)
Admission: RE | Admit: 2010-08-07 | Discharge: 2010-08-07 | Disposition: A | Discharge status: Home / Self Care | Payer: 59 | Source: Ambulatory Visit | Attending: Internal Medicine | Admitting: Internal Medicine

## 2010-08-07 ENCOUNTER — Ambulatory Visit (INDEPENDENT_AMBULATORY_CARE_PROVIDER_SITE_OTHER): Payer: 59 | Admitting: Internal Medicine

## 2010-08-07 ENCOUNTER — Encounter: Payer: Self-pay | Admitting: Internal Medicine

## 2010-08-07 VITALS — BP 142/84 | HR 93 | Ht 69.0 in | Wt 347.8 lb

## 2010-08-07 DIAGNOSIS — J984 Other disorders of lung: Secondary | ICD-10-CM

## 2010-08-07 DIAGNOSIS — R911 Solitary pulmonary nodule: Secondary | ICD-10-CM | POA: Insufficient documentation

## 2010-08-07 DIAGNOSIS — J159 Unspecified bacterial pneumonia: Secondary | ICD-10-CM

## 2010-08-07 DIAGNOSIS — J40 Bronchitis, not specified as acute or chronic: Secondary | ICD-10-CM

## 2010-08-07 NOTE — Progress Notes (Signed)
  Subjective:    Patient ID: [name], female    DOB: 1960/07/28, 50 y.o.   MRN: [id number]  HPI 08/07/10- 14 yo F former smoker. Seen with female companion in kind referral by Dr Lovell Sheehan concerned about an abnormal CT scan. She had smoked from her teens until May, 2012. She had abrupt onset 06/23/10 of shortness of breath, light headed, dry cough, sweats. Dx'd pneumonia and rx'd Zpak then went to Dr Lovell Sheehan for f/u. CT chest 06/23/10 for c/o chest pain and cough- multifocal airspace densities left lung c/w infection. Small 5.3 mm subpleural nodule RUL.  Now feeling much improved. Still some raspy cough and thick gray mucus.  Denies prior lung disease or pneumonia beyond an occasional bronchitis with rare use of a rescue inhaler. Some seasonal rhinitis. No ENT surgery.   Review of Systems Constitutional:   No weight loss, night sweats,  Fevers, chills, fatigue, lassitude. HEENT:   No headaches,  Difficulty swallowing,  Tooth/dental problems,  Sore throat,                No sneezing, itching, ear ache, nasal congestion, post nasal drip,   CV:  No chest pain,  Orthopnea, PND, swelling in lower extremities, anasarca, dizziness, palpitations  GI  No heartburn, indigestion, abdominal pain, nausea, vomiting, diarrhea, change in bowel habits, loss of appetite  Resp: No shortness of breath with exertion or at rest.  No excess mucus, ,  No coughing up of blood.  No change in color of mucus.  No wheezing.    Skin: no rash or lesions.  GU: no dysuria, change in color of urine, no urgency or frequency.  No flank pain.  MS:  No joint pain or swelling.  No decreased range of motion.  No back pain.  Psych:  No change in mood or affect. No depression or anxiety.  No memory loss.    Objective:   Physical Exam General- Alert, Oriented, Affect-appropriate, Distress- none acute  obese  Skin- rash-none, lesions- none, excoriation- none  Lymphadenopathy- none  Head- atraumatic  Eyes- Gross vision  intact, PERRLA, conjunctivae clear secretions  Ears- Hearing, canals, Tm- normal  Nose- Clear, No-Septal dev, mucus, polyps, erosion, perforation   Throat- Mallampati II , mucosa red , drainage- none, tonsils- present  Neck- flexible , trachea midline, no stridor , thyroid nl, carotid no bruit  Chest - symmetrical excursion , unlabored     Heart/CV- RRR , no murmur , no gallop  , no rub, nl s1 s2                     - JVD- none , edema- none, stasis changes- none, varices- none     Lung- upper airway congestion, wheeze- none, cough- none , dullness-none, rub- none     Chest wall-   Abd- tender-no, distended-no, bowel sounds-present, HSM- no  Br/ Gen/ Rectal- Not done, not indicated  Extrem- cyanosis- none, clubbing, none, atrophy- none, strength- nl  Neuro- grossly intact to observation         Assessment & Plan:

## 2010-08-07 NOTE — Patient Instructions (Signed)
Order- CXR- hx pneumonia, lung nodule               Schedule PFT

## 2010-08-09 NOTE — Progress Notes (Signed)
Quick Note:  Pt aware of results. ______ 

## 2010-08-11 NOTE — Assessment & Plan Note (Signed)
Clinically a bronchopneumonia, resolved after. CXR will confirm clearance. This will not likely be related to the nodule.

## 2010-08-11 NOTE — Assessment & Plan Note (Signed)
Occasional bronchits, should become less frequent off cigarettes.

## 2010-08-11 NOTE — Assessment & Plan Note (Signed)
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 We will follow it for 2 years or as appropriate. It will not show on CXR, but failure to appear would be evidence of stability and Balance between CXR and low dose CT was discussed.

## 2010-08-21 ENCOUNTER — Ambulatory Visit (INDEPENDENT_AMBULATORY_CARE_PROVIDER_SITE_OTHER): Payer: 59 | Admitting: Internal Medicine

## 2010-08-21 DIAGNOSIS — R911 Solitary pulmonary nodule: Secondary | ICD-10-CM

## 2010-08-21 DIAGNOSIS — J159 Unspecified bacterial pneumonia: Secondary | ICD-10-CM

## 2010-08-21 NOTE — Progress Notes (Signed)
PFT done today. 

## 2010-11-07 ENCOUNTER — Ambulatory Visit: Payer: 59 | Admitting: Internal Medicine

## 2011-05-15 ENCOUNTER — Emergency Department (HOSPITAL_COMMUNITY)
Admission: EM | Admit: 2011-05-15 | Discharge: 2011-05-15 | Disposition: A | Discharge status: Home / Self Care | Payer: 59 | Source: Home / Self Care | Attending: Emergency Medicine | Admitting: Emergency Medicine

## 2011-05-15 ENCOUNTER — Encounter (HOSPITAL_COMMUNITY): Payer: Self-pay | Admitting: Cardiology

## 2011-05-15 DIAGNOSIS — G43909 Migraine, unspecified, not intractable, without status migrainosus: Secondary | ICD-10-CM

## 2011-05-15 MED ORDER — DEXAMETHASONE SODIUM PHOSPHATE 10 MG/ML IJ SOLN
INTRAMUSCULAR | Status: AC
  Filled 2011-05-15: qty 1

## 2011-05-15 MED ORDER — SUMATRIPTAN SUCCINATE 50 MG PO TABS: 50.0000 mg | ORAL_TABLET | ORAL | Status: DC | PRN

## 2011-05-15 MED ORDER — KETOROLAC TROMETHAMINE 30 MG/ML IJ SOLN
INTRAMUSCULAR | Status: AC
  Filled 2011-05-15: qty 1

## 2011-05-15 MED ORDER — DIPHENHYDRAMINE HCL 50 MG/ML IJ SOLN
25.0000 mg | Freq: Once | INTRAMUSCULAR | Status: AC
  Administered 2011-05-15: 25 mg via INTRAMUSCULAR

## 2011-05-15 MED ORDER — DEXAMETHASONE SODIUM PHOSPHATE 10 MG/ML IJ SOLN
10.0000 mg | Freq: Once | INTRAMUSCULAR | Status: AC
  Administered 2011-05-15: 10 mg via INTRAMUSCULAR

## 2011-05-15 MED ORDER — DIPHENHYDRAMINE HCL 50 MG/ML IJ SOLN
INTRAMUSCULAR | Status: AC
  Filled 2011-05-15: qty 1

## 2011-05-15 MED ORDER — KETOROLAC TROMETHAMINE 30 MG/ML IJ SOLN
30.0000 mg | Freq: Once | INTRAMUSCULAR | Status: AC
  Administered 2011-05-15: 30 mg via INTRAMUSCULAR

## 2011-05-15 NOTE — ED Provider Notes (Signed)
Chief Complaint  Patient presents with  . Headache  . Dizziness    History of Present Illness:   The patient is a 51 year old female who has had a three-day history of a severe, right-sided headache. This began gradually and, got better then worse. It involves the right side of the head and it is sharp and throbbing, rated 10 over 10 in intensity. It's associated with nausea and dizziness. It is worse with activity, but there is no photophobia or phonophobia. She did get partial relief with ibuprofen. She has had 2 migraines in the past but did respond well to Imitrex. On both occasions they were a little different from his headache and that she did have photophobia and phonophobia with it. She describes some blurring of her vision no diplopia. There are no other definite neurological symptoms. She had pain in her left leg at the onset of the symptoms but then this went away. She denies any numbness, tingling, or weakness. She denies any fever, chills, or stiff neck.  Review of Systems:  Other than noted above, the patient denies any of the following symptoms: Systemic:  No fever, chills, fatigue, photophobia, stiff neck. Eye:  No redness, eye pain, discharge, blurred vision, or diplopia. ENT:  No nasal congestion, rhinorrhea, sinus pressure or pain, sneezing, earache, or sore throat.  No jaw claudication. Neuro:  No paresthesias, loss of consciousness, seizure activity, muscle weakness, trouble with coordination or gait, trouble speaking or swallowing. Psych:  No depression, anxiety or trouble sleeping.  PMFSH:  Past medical history, family history, social history, meds, and allergies were reviewed.  Physical Exam:   Vital signs:  BP 145/82  Pulse 81  Temp(Src) 98.3 F (36.8 C) (Oral)  Resp 18  SpO2 99% General:  Alert and oriented.  In no distress. Eye:  Lids and conjunctivas normal.  PERRL,  Full EOMs.  Fundi benign with normal discs and vessels. ENT:  No cranial or facial tenderness to  palpation.  TMs and canals clear.  Nasal mucosa was normal and uncongested without any drainage. No intra oral lesions, pharynx clear, mucous membranes moist, dentition normal. Neck:  Supple, full ROM, no tenderness to palpation.  No adenopathy or mass. Neuro:  Alert and orented times 3.  Speech was clear, fluent, and appropriate.  Cranial nerves intact. No pronator drift, muscle strength normal. Finger to nose normal.  DTRs 2+ .Station and gait were normal.  Romberg's sign was normal.  Able to perform tandem gait well. Psych:  Normal affect.  Medications given in [facility]:  She was given Decadron 10 mg IM, Toradol 30 mg IM, and Benadryl 25 mg IM.  Assessment:  The encounter diagnosis was Migraine headache.  Plan:   1.  The following meds were prescribed:   New Prescriptions   SUMATRIPTAN (IMITREX) 50 MG TABLET    Take 1 tablet (50 mg total) by mouth every 2 (two) hours as needed for migraine.   2.  The patient was instructed in symptomatic care and handouts were given. 3.  The patient was told to return if becoming worse in any way, if no better in 3 or 4 days, and given some red flag symptoms that would indicate earlier return.    Reuben Likes, MD 05/15/11 2022

## 2011-05-15 NOTE — ED Notes (Addendum)
Pt reports headache to have started this past Monday to the front/right side of her head. She has had migraines in the past. Denies photophobia or aura. She has dizziness when she stands up. Pt has nausea but has not vomited. Decreased appetite. Tolerating PO liquids. Pt had "funny" sensation in left leg on Monday night with a pain following the sensation and decreased movement to left foot. Pt denies pain to left leg at this time. Pt took ibuprofen yesterday with a period of relief from headache then came back.

## 2011-05-15 NOTE — Discharge Instructions (Signed)
Follow up regarding glucose of 202.  Migraine Headache A migraine headache is an intense, throbbing pain on one or both sides of your head. The exact cause of a migraine headache is not always known. A migraine may be caused when nerves in the brain become irritated and release chemicals that cause swelling within blood vessels, causing pain. Many migraine sufferers have a family history of migraines. Before you get a migraine you may or may not get an aura. An aura is a group of symptoms that can predict the beginning of a migraine. An aura may include:  Visual changes such as:   Flashing lights.   Bright spots or zig-zag lines.   Tunnel vision.   Feelings of numbness.   Trouble talking.   Muscle weakness.  SYMPTOMS  Pain on one or both sides of your head.   Pain that is pulsating or throbbing in nature.   Pain that is severe enough to prevent daily activities.   Pain that is aggravated by any daily physical activity.   Nausea (feeling sick to your stomach), vomiting, or both.   Pain with exposure to bright lights, loud noises, or activity.   General sensitivity to bright lights or loud noises.  MIGRAINE TRIGGERS Examples of triggers of migraine headaches include:   Alcohol.   Smoking.   Stress.   It may be related to menses (female menstruation).   Aged cheeses.   Foods or drinks that contain nitrates, glutamate, aspartame, or tyramine.   Lack of sleep.   Chocolate.   Caffeine.   Hunger.   Medications such as nitroglycerine (used to treat chest pain), birth control pills, estrogen, and some blood pressure medications.  DIAGNOSIS  A migraine headache is often diagnosed based on:  Symptoms.   Physical examination.   A computerized X-ray scan (computed tomography, CT) of your head.  TREATMENT  Medications can help prevent migraines if they are recurrent or should they become recurrent. Your caregiver can help you with a medication or treatment program  that will be helpful to you.   Lying down in a dark, quiet room may be helpful.   Keeping a headache diary may help you find a trend as to what may be triggering your headaches.  SEEK IMMEDIATE MEDICAL CARE IF:   You have confusion, personality changes or seizures.   You have headaches that wake you from sleep.   You have an increased frequency in your headaches.   You have a stiff neck.   You have a loss of vision.   You have muscle weakness.   You start losing your balance or have trouble walking.   You feel faint or pass out.  MAKE SURE YOU:   Understand these instructions.   Will watch your condition.   Will get help right away if you are not doing well or get worse.  Document Released: 01/21/2005 Document Revised: 01/10/2011 Document Reviewed: 09/06/2008 [facility] Patient Information 2012 [place], [place].

## 2011-07-10 ENCOUNTER — Other Ambulatory Visit (HOSPITAL_COMMUNITY): Payer: Self-pay | Admitting: Internal Medicine

## 2011-07-10 DIAGNOSIS — Z1231 Encounter for screening mammogram for malignant neoplasm of breast: Secondary | ICD-10-CM

## 2011-08-02 ENCOUNTER — Ambulatory Visit (HOSPITAL_COMMUNITY): Payer: 59

## 2011-08-07 ENCOUNTER — Ambulatory Visit (HOSPITAL_COMMUNITY)
Admission: RE | Admit: 2011-08-07 | Discharge: 2011-08-07 | Disposition: A | Discharge status: Home / Self Care | Payer: 59 | Source: Ambulatory Visit | Attending: Internal Medicine | Admitting: Internal Medicine

## 2011-08-07 DIAGNOSIS — Z1231 Encounter for screening mammogram for malignant neoplasm of breast: Secondary | ICD-10-CM | POA: Insufficient documentation

## 2011-09-29 ENCOUNTER — Inpatient Hospital Stay (HOSPITAL_COMMUNITY)
Admission: EM | Admit: 2011-09-29 | Discharge: 2011-10-01 | DRG: 281 | Disposition: A | Discharge status: Home / Self Care | Payer: 59 | Attending: Cardiology | Admitting: Cardiology

## 2011-09-29 ENCOUNTER — Encounter (HOSPITAL_COMMUNITY): Payer: Self-pay | Admitting: *Deleted

## 2011-09-29 ENCOUNTER — Emergency Department (HOSPITAL_COMMUNITY): Payer: 59

## 2011-09-29 DIAGNOSIS — IMO0001 Reserved for inherently not codable concepts without codable children: Secondary | ICD-10-CM | POA: Diagnosis present

## 2011-09-29 DIAGNOSIS — E781 Pure hyperglyceridemia: Secondary | ICD-10-CM | POA: Diagnosis present

## 2011-09-29 DIAGNOSIS — I10 Essential (primary) hypertension: Secondary | ICD-10-CM | POA: Diagnosis present

## 2011-09-29 DIAGNOSIS — I200 Unstable angina: Secondary | ICD-10-CM

## 2011-09-29 DIAGNOSIS — I214 Non-ST elevation (NSTEMI) myocardial infarction: Principal | ICD-10-CM | POA: Diagnosis present

## 2011-09-29 DIAGNOSIS — Z79899 Other long term (current) drug therapy: Secondary | ICD-10-CM

## 2011-09-29 DIAGNOSIS — Z7189 Other specified counseling: Secondary | ICD-10-CM

## 2011-09-29 DIAGNOSIS — Z87891 Personal history of nicotine dependence: Secondary | ICD-10-CM

## 2011-09-29 DIAGNOSIS — R079 Chest pain, unspecified: Secondary | ICD-10-CM | POA: Diagnosis present

## 2011-09-29 DIAGNOSIS — E119 Type 2 diabetes mellitus without complications: Secondary | ICD-10-CM

## 2011-09-29 DIAGNOSIS — J45909 Unspecified asthma, uncomplicated: Secondary | ICD-10-CM | POA: Diagnosis present

## 2011-09-29 DIAGNOSIS — Z6841 Body Mass Index (BMI) 40.0 and over, adult: Secondary | ICD-10-CM

## 2011-09-29 DIAGNOSIS — E039 Hypothyroidism, unspecified: Secondary | ICD-10-CM | POA: Diagnosis present

## 2011-09-29 HISTORY — DX: Hypothyroidism, unspecified: E03.9

## 2011-09-29 HISTORY — DX: Personal history of nicotine dependence: Z87.891

## 2011-09-29 HISTORY — DX: Obesity, unspecified: E66.9

## 2011-09-29 HISTORY — DX: Pure hyperglyceridemia: E78.1

## 2011-09-29 HISTORY — DX: Type 2 diabetes mellitus without complications: E11.9

## 2011-09-29 HISTORY — DX: Non-ST elevation (NSTEMI) myocardial infarction: I21.4

## 2011-09-29 HISTORY — DX: Adverse effect of other drugs, medicaments and biological substances, initial encounter: T50.995A

## 2011-09-29 LAB — BASIC METABOLIC PANEL
BUN: 14 mg/dL (ref 6–23)
Chloride: 96 mEq/L (ref 96–112)
GFR calc Af Amer: >90 mL/min (ref >90)
GFR calc non Af Amer: >90 mL/min (ref >90)
Potassium: 4.2 mEq/L (ref 3.5–5.1)
Sodium: 134 mEq/L — ABNORMAL LOW (ref 135–145)

## 2011-09-29 LAB — GLUCOSE, CAPILLARY: Glucose-Capillary: 232 mg/dL — ABNORMAL HIGH (ref 70–99)

## 2011-09-29 LAB — POCT I-STAT TROPONIN I: Troponin i, poc: 0.03 ng/mL (ref 0.00–0.08)

## 2011-09-29 LAB — CBC
Hemoglobin: 12.8 g/dL (ref 12.0–15.0)
MCH: 29.3 pg (ref 26.0–34.0)
MCHC: 33.5 g/dL (ref 30.0–36.0)
MCV: 87.4 fL (ref 78.0–100.0)

## 2011-09-29 LAB — CARDIAC PANEL(CRET KIN+CKTOT+MB+TROPI): Relative Index: 2.5 (ref 0.0–2.5)

## 2011-09-29 LAB — PROTIME-INR: INR: 0.97 (ref 0.00–1.49)

## 2011-09-29 LAB — D-DIMER, QUANTITATIVE: D-Dimer, Quant: 0.36 ug/mL-FEU (ref 0.00–0.48)

## 2011-09-29 LAB — APTT: aPTT: 26 seconds (ref 24–37)

## 2011-09-29 LAB — HEMOGLOBIN A1C: Hgb A1c MFr Bld: 11.5 % — ABNORMAL HIGH (ref <5.7)

## 2011-09-29 LAB — PRO B NATRIURETIC PEPTIDE: Pro B Natriuretic peptide (BNP): 95 pg/mL (ref 0–125)

## 2011-09-29 MED ORDER — SODIUM CHLORIDE 0.9 % IV SOLN
INTRAVENOUS | Status: DC
  Administered 2011-09-29: 23:00:00 via INTRAVENOUS

## 2011-09-29 MED ORDER — SUMATRIPTAN SUCCINATE 50 MG PO TABS: 50.0000 mg | ORAL_TABLET | ORAL | Status: DC | PRN

## 2011-09-29 MED ORDER — LEVOTHYROXINE SODIUM 175 MCG PO TABS
175.0000 ug | ORAL_TABLET | Freq: Every day | ORAL | Status: DC
  Administered 2011-09-30 – 2011-10-01 (×2): 175 ug via ORAL
  Filled 2011-09-29 (×5): qty 1

## 2011-09-29 MED ORDER — ONDANSETRON HCL 4 MG/2ML IJ SOLN: 4.0000 mg | Freq: Four times a day (QID) | INTRAMUSCULAR | Status: DC | PRN

## 2011-09-29 MED ORDER — POTASSIUM CHLORIDE CRYS ER 20 MEQ PO TBCR
20.0000 meq | EXTENDED_RELEASE_TABLET | Freq: Every day | ORAL | Status: DC
  Administered 2011-09-29 – 2011-10-01 (×3): 20 meq via ORAL
  Filled 2011-09-29 (×3): qty 1

## 2011-09-29 MED ORDER — HEPARIN BOLUS VIA INFUSION
4000.0000 [IU] | Freq: Once | INTRAVENOUS | Status: AC
  Administered 2011-09-29: 4000 [IU] via INTRAVENOUS
  Filled 2011-09-29: qty 4000

## 2011-09-29 MED ORDER — ADULT MULTIVITAMIN W/MINERALS CH
1.0000 | ORAL_TABLET | Freq: Every day | ORAL | Status: DC
  Administered 2011-09-29 – 2011-10-01 (×3): 1 via ORAL
  Filled 2011-09-29 (×3): qty 1

## 2011-09-29 MED ORDER — ONE-DAILY MULTI VITAMINS PO TABS: 1.0000 | ORAL_TABLET | Freq: Every day | ORAL | Status: DC

## 2011-09-29 MED ORDER — CLONIDINE HCL 0.1 MG PO TABS
0.1000 mg | ORAL_TABLET | Freq: Two times a day (BID) | ORAL | Status: DC
  Filled 2011-09-29: qty 1

## 2011-09-29 MED ORDER — TRIAMTERENE-HCTZ 75-50 MG PO TABS
1.0000 | ORAL_TABLET | Freq: Every day | ORAL | Status: DC
  Administered 2011-09-29 – 2011-10-01 (×2): 1 via ORAL
  Filled 2011-09-29 (×3): qty 1

## 2011-09-29 MED ORDER — METOPROLOL TARTRATE 25 MG PO TABS
25.0000 mg | ORAL_TABLET | Freq: Two times a day (BID) | ORAL | Status: DC
  Administered 2011-09-29 – 2011-10-01 (×4): 25 mg via ORAL
  Filled 2011-09-29 (×6): qty 1

## 2011-09-29 MED ORDER — ACETAMINOPHEN 325 MG PO TABS: 650.0000 mg | ORAL_TABLET | Freq: Four times a day (QID) | ORAL | Status: DC | PRN

## 2011-09-29 MED ORDER — INSULIN ASPART 100 UNIT/ML ~~LOC~~ SOLN
0.0000 [IU] | Freq: Three times a day (TID) | SUBCUTANEOUS | Status: DC
  Administered 2011-09-30 (×2): 15 [IU] via SUBCUTANEOUS
  Administered 2011-09-30: 5 [IU] via SUBCUTANEOUS

## 2011-09-29 MED ORDER — MORPHINE SULFATE 2 MG/ML IJ SOLN
1.0000 mg | INTRAMUSCULAR | Status: DC | PRN
  Administered 2011-09-29: 2 mg via INTRAVENOUS
  Filled 2011-09-29: qty 1

## 2011-09-29 MED ORDER — ACETAMINOPHEN 650 MG RE SUPP: 650.0000 mg | Freq: Four times a day (QID) | RECTAL | Status: DC | PRN

## 2011-09-29 MED ORDER — FUROSEMIDE 40 MG PO TABS
40.0000 mg | ORAL_TABLET | Freq: Every day | ORAL | Status: DC
  Administered 2011-09-29 – 2011-10-01 (×2): 40 mg via ORAL
  Filled 2011-09-29 (×3): qty 1

## 2011-09-29 MED ORDER — OXYCODONE HCL 5 MG PO TABS: 5.0000 mg | ORAL_TABLET | ORAL | Status: DC | PRN

## 2011-09-29 MED ORDER — ASPIRIN 81 MG PO CHEW
324.0000 mg | CHEWABLE_TABLET | Freq: Once | ORAL | Status: AC
  Administered 2011-09-29: 324 mg via ORAL
  Filled 2011-09-29: qty 4

## 2011-09-29 MED ORDER — ASPIRIN EC 325 MG PO TBEC
325.0000 mg | DELAYED_RELEASE_TABLET | Freq: Every day | ORAL | Status: DC
  Administered 2011-09-29: 325 mg via ORAL
  Filled 2011-09-29: qty 1

## 2011-09-29 MED ORDER — NITROGLYCERIN 0.4 MG SL SUBL
SUBLINGUAL_TABLET | SUBLINGUAL | Status: AC
  Filled 2011-09-29: qty 25

## 2011-09-29 MED ORDER — ONDANSETRON HCL 4 MG PO TABS: 4.0000 mg | ORAL_TABLET | Freq: Four times a day (QID) | ORAL | Status: DC | PRN

## 2011-09-29 MED ORDER — SODIUM CHLORIDE 0.9 % IJ SOLN: 3.0000 mL | Freq: Two times a day (BID) | INTRAMUSCULAR | Status: DC

## 2011-09-29 MED ORDER — GI COCKTAIL ~~LOC~~
30.0000 mL | Freq: Three times a day (TID) | ORAL | Status: DC | PRN
Start: 2011-09-29 — End: 2011-10-01
  Filled 2011-09-29: qty 30

## 2011-09-29 MED ORDER — INSULIN ASPART 100 UNIT/ML ~~LOC~~ SOLN
5.0000 [IU] | Freq: Once | SUBCUTANEOUS | Status: AC
  Administered 2011-09-29: 5 [IU] via SUBCUTANEOUS

## 2011-09-29 MED ORDER — HEPARIN (PORCINE) IN NACL 100-0.45 UNIT/ML-% IJ SOLN
2300.0000 [IU]/h | INTRAMUSCULAR | Status: DC
  Administered 2011-09-29: 1400 [IU]/h via INTRAVENOUS
  Administered 2011-09-30: 1900 [IU]/h via INTRAVENOUS
  Filled 2011-09-29 (×3): qty 250

## 2011-09-29 MED ORDER — ENOXAPARIN SODIUM 40 MG/0.4ML ~~LOC~~ SOLN
40.0000 mg | SUBCUTANEOUS | Status: DC
  Filled 2011-09-29: qty 0.4

## 2011-09-29 MED ORDER — NITROGLYCERIN 0.4 MG SL SUBL: 0.4000 mg | SUBLINGUAL_TABLET | SUBLINGUAL | Status: DC | PRN

## 2011-09-29 MED ORDER — GLIPIZIDE 5 MG PO TABS
5.0000 mg | ORAL_TABLET | Freq: Every day | ORAL | Status: DC
  Administered 2011-10-01: 5 mg via ORAL
  Filled 2011-09-29 (×5): qty 1

## 2011-09-29 MED ORDER — MORPHINE SULFATE 4 MG/ML IJ SOLN
4.0000 mg | Freq: Once | INTRAMUSCULAR | Status: AC
  Administered 2011-09-29: 4 mg via INTRAVENOUS
  Filled 2011-09-29: qty 1

## 2011-09-29 NOTE — Progress Notes (Signed)
ANTICOAGULATION CONSULT NOTE - Initial Consult  Pharmacy Consult for IV Heparin Indication: chest pain/ACS  Allergies  Allergen Reactions  . Ivp Dye (Iodinated Diagnostic Agents) Swelling    "skin came off"  . Codeine Itching  . Other     NO PLASTIC TAPE    Patient Measurements: Height: 5\' 8"  (172.7 cm) Weight: 347 lb 12.8 oz (157.76 kg) IBW/kg (Calculated) : 63.9  Heparin Dosing Weight: 103 kg  Vital Signs: Temp: 98.1 F (36.7 C) (08/25 1811) Temp src: Oral (08/25 1811) BP: 144/86 mmHg (08/25 1811) Pulse Rate: 68  (08/25 1811)  Labs:  Basename 09/29/11 1705 09/29/11 1352  HGB -- 12.8  HCT -- 38.2  PLT -- 333  APTT -- --  LABPROT -- --  INR -- --  HEPARINUNFRC -- --  CREATININE -- 0.78  CKTOTAL 212* --  CKMB 5.2* --  TROPONINI 0.32* --    Estimated Creatinine Clearance: 133.3 ml/min (by C-G formula based on Cr of 0.78).   Medical History: Past Medical History  Diagnosis Date  . Hypertension   . Diabetes mellitus   . Thyroid activity decreased   . Asthma     Questionable  . ALLERGIC RHINITIS      Assessment: 58 YOF presenting with CP.  PMH significant for HTN, DM2, morbid obesity.  EKG negative, DVT/PE ruled out by negative d-dimer, and troponin negative x 1 so far.  Patient to be observed for ACS and started on IV heparin.  Goal of Therapy:  Heparin level 0.3-0.7 units/ml Monitor platelets by anticoagulation protocol: Yes   Plan:  Heparin bolus of 4000 units IV x 1 followed by start of infusion at 1400 units/hr (14 mL/hr).  Check heparin level in 6 hours. Daily heparin level and CBC.  Clance Boll 09/29/2011,7:42 PM

## 2011-09-29 NOTE — H&P (Signed)
Primary Cardiologist: previously unassigned  Chief Complaint: chest pain  Patient Location: Room 2005  HPI:  [name] is a delightful 51 year old woman with hypertension, diabetes, and obesity who presented to the [facility] ED with chest pain.  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 She recently traveled to [place] for vacation and felt well during that trip. At 2 am on Sunday morning (8/25), she awoke with chest pain which she describes as a heaviness, radiating to her left arm. The pain felt like someone was sitting on her chest. No diaphoresis or nausea. Thinking it might be indigestion, she took a Tums but did not experience any improvement. She tried to continue resting but later in the morning the pain intensified to the point where it made her cry. She has never before felt this degree of chest discomfort. As a result, she decided to seek medical attention.   Of note, she denies PND/orthopnea/leg swelling. No palpitations or lightheadedness. No painful leg swelling/warmth/erythema.   Past Medical History  Diagnosis Date  . Hypertension   . Diabetes mellitus   . Thyroid activity decreased   . Asthma     Questionable  . ALLERGIC RHINITIS     Past Surgical History  Procedure Date  . Vesicovaginal fistula closure w/ tah    Family History: No premature CAD  Social History:  Extensive 30-year smoking history; quit 2 years ago No EtOH Live alone Son lives in the area Works as Naval architect; stressful job managing mostly teenagers  Review of Systems: As per HPI; otherwise comprehensively negative  Allergies:  Allergies  Allergen Reactions  . Ivp Dye (Iodinated Diagnostic Agents) Swelling    "skin came off"  . Codeine Itching  . Other     NO PLASTIC TAPE   Medications: Clonidine 0.1mg  BID Triamterene-HCTZ 75/50 Sumatriptan 50mg  Furosemide 40mg   Glipizide 5mg  K-Dur  20 mEq MVN Levothyroxine  Exam: Afebrile 138/88 79 20 96% RA No acute distress, no accessory muscle use No evident JVD, no carotid bruits S1 S2 regular, no audible murmurs Chest clear to auscultation bilaterally Abdomen obese, non-tender, non-distended Ext warm, well-perfused, no edema Neuro alert & oriented x3 Skin warm & dry  Labs: WBC 13.5K Hgb 12.8 Plts 333K Na 134 K 4.2 Cl 96 CO2 25 BUN 14 Cr 0.7 D-Dimer 0.36 Trop 0.03 to 0.32 HA1C 11  ECG: normal sinus rhythm, normal axis, no Q waves, no ST segment abnormalities  Assessment/Plan 51 year old woman with coronary risk factors that include hypertension, diabetes, and past tobacco use who presents with chest pain concerning for unstable coronary physiology. Despite normal initial cardiac biomarkers, her most recent set was abnormal, with a Troponin of 0.32. Given this result, the decision was made to transfer her from [facility] to [facility] for a potential invasive evaluation in the morning. I think she warrants coronary angiography to define her anatomy and to evaluate for the presence of flow-limiting disease. Though pulmonary embolism is a potential alternative explantion given her recent travel history, her D-Dimer is normal and she has had no signs or symptoms of a DVT. I thus think she warrants a formal coronary evaluation. She is currently without chest pain.  1) Unstable angina/NSTEMI - IV Heparin infusion with trending of her cardiac biomarkers - Anti-platelet therapy in the form of Aspirin - Beta-blockade was started at [facility] and has been tolerated; continue for  its anti-anginal and anti-hypertensive effects - Statin therapy initiated with a lipid profile pending - NPO after midnight for possible catheterization in the AM  2) Hypertension - Continue home regimen with the addition of Metoprolol as discussed above - Given her underlying diabetes, it would be useful to transition her to an  ACE-Inhibitor, should she require an additional anti-hypertensive  3) Diabetes - Her HA1C is sub-optimal at 11, so her glucose levels should be followed with consideration to intensification of therapy as needed  I explained my impression to the patient. Her questions were answered to the best of my ability.  Zacarias Pontes, MD Cardiology Fellow On-Call [phone]

## 2011-09-29 NOTE — H&P (Signed)
[facility] History and Physical  [name] ZOX:[id number] DOB: 1960/04/10 DOA: 09/29/2011  Referring physician: Tobin Chad, MD PCP: Ron Parker, MD   Chief Complaint: chest pain  HPI: [name] is a 51 y.o. African American female with past medical history of hypertension, diabetes mellitus type 2 and morbid obesity. She came to the hospital complaining about chest pain. Patient said she was in her usual state of health about 2 AM when she woke up with 10/10 chest pain. The pain is substernal, sharp, lasts from 2 AM to 11:30 AM. Although she said there is no radiation, she mentioned that she have tingling in her left hand, she denies any palpitations, shortness of breath for headaches. Patient also mentioned that her blood pressure went up, but was quite okay in the emergency department here. Patient has recent history of travel, she traveled to [facility] and then to [place]. Upon initial evaluation in the emergency department if the DVT/PE was ruled out by negative d-dimer. Patient chest x-ray is normal, third set of cardiac enzymes is negative and she has no significant changes in her EKG. Patient will be observed overnight to rule out acute coronary syndrome.   Review of Systems:  Constitutional: negative for anorexia, fevers and sweats Eyes: negative for irritation, redness and visual disturbance Ears, nose, mouth, throat, and face: negative for earaches, epistaxis, nasal congestion and sore throat Respiratory: negative for cough, dyspnea on exertion, sputum and wheezing Cardiovascular: per history of present illness Gastrointestinal: negative for abdominal pain, constipation, diarrhea, melena, nausea and vomiting Genitourinary:negative for dysuria, frequency and hematuria Hematologic/lymphatic: negative for bleeding, easy bruising and lymphadenopathy Musculoskeletal:negative for arthralgias, muscle weakness and stiff joints Neurological: negative for  coordination problems, gait problems, headaches and weakness Endocrine: negative for diabetic symptoms including polydipsia, polyuria and weight loss Allergic/Immunologic: negative for anaphylaxis, hay fever and urticaria  Past Medical History  Diagnosis Date  . Hypertension   . Diabetes mellitus   . Thyroid activity decreased   . Asthma     Questionable  . ALLERGIC RHINITIS    Past Surgical History  Procedure Date  . Vesicovaginal fistula closure w/ tah    Social History:  reports that she quit smoking about 14 months ago. Her smoking use included Cigarettes. She has a 28.8 pack-year smoking history. She does not have any smokeless tobacco history on file. She reports that she drinks alcohol. She reports that she does not use illicit drugs. She is a Social research officer, government at [employer], ambulatory.  Allergies  Allergen Reactions  . Ivp Dye (Iodinated Diagnostic Agents) Swelling    "skin came off"  . Codeine Itching  . Other     NO PLASTIC TAPE    Family History  Problem Relation Age of Onset  . Heart disease Mother     has pacemaker  . Diabetes      strong family history  . Cancer Maternal Uncle     Prior to Admission medications   Medication Sig Start Date End Date Taking? Authorizing Provider  cloNIDine (CATAPRES) 0.1 MG tablet Take 0.1 mg by mouth 2 (two) times daily.   Yes Historical Provider, MD  furosemide (LASIX) 40 MG tablet Take 1 tablet by mouth Once daily as needed. 07/04/10  Yes Historical Provider, MD  glipiZIDE (GLUCOTROL) 5 MG tablet Take 1 tablet by mouth Twice daily. 07/04/10  Yes Historical Provider, MD  Multiple Vitamin (MULTIVITAMIN) tablet Take 1 tablet by mouth daily.     Yes Historical Provider, MD  potassium chloride SA (K-DUR,KLOR-CON) 20 MEQ tablet Take 1 tablet by mouth Daily. 07/04/10  Yes Historical Provider, MD  SUMAtriptan (IMITREX) 50 MG tablet Take 1 tablet (50 mg total) by mouth every 2 (two) hours as needed for migraine. 05/15/11  05/14/12 Yes Reuben Likes, MD  SYNTHROID 175 MCG tablet Take 1 tablet by mouth Daily. 08/06/10  Yes Historical Provider, MD  triamterene-hydrochlorothiazide (MAXZIDE) 75-50 MG per tablet Take 1 tablet by mouth daily.    Yes Historical Provider, MD   Physical Exam: Filed Vitals:   09/29/11 1205 09/29/11 1231  BP: 193/93 141/77  Pulse: 78 74  Temp: 99 F (37.2 C) 98 F (36.7 C)  TempSrc: Oral Oral  Resp: 20 17  SpO2: 100% 97%   General appearance: alert, cooperative and no distress  Head: Normocephalic, without obvious abnormality, atraumatic  Eyes: conjunctivae/corneas clear. PERRL, EOM's intact. Fundi benign.  Nose: Nares normal. Septum midline. Mucosa normal. No drainage or sinus tenderness.  Throat: lips, mucosa, and tongue normal; teeth and gums normal  Neck: Supple, no masses, no cervical lymphadenopathy, no JVD appreciated, no meningeal signs Resp: clear to auscultation bilaterally  Chest wall: no tenderness  Cardio: regular rate and rhythm, S1, S2 normal, no murmur, click, rub or gallop  GI: soft, non-tender; bowel sounds normal; no masses, no organomegaly  Extremities: extremities normal, atraumatic, no cyanosis, there is trace pedal edema  Skin: Skin color, texture, turgor normal. No rashes or lesions  Neurologic: Alert and oriented X 3, normal strength and tone. Normal symmetric reflexes. Normal coordination and gai  Labs on Admission:  Basic Metabolic Panel:  Lab 09/29/11 4401  NA 134*  K 4.2  CL 96  CO2 25  GLUCOSE 324*  BUN 14  CREATININE 0.78  CALCIUM 9.8  MG --  PHOS --   Liver Function Tests: No results found for this basename: AST:5,ALT:5,ALKPHOS:5,BILITOT:5,PROT:5,ALBUMIN:5 in the last 168 hours No results found for this basename: LIPASE:5,AMYLASE:5 in the last 168 hours No results found for this basename: AMMONIA:5 in the last 168 hours CBC:  Lab 09/29/11 1352  WBC 13.5*  NEUTROABS --  HGB 12.8  HCT 38.2  MCV 87.4  PLT 333   Cardiac  Enzymes: No results found for this basename: CKTOTAL:5,CKMB:5,CKMBINDEX:5,TROPONINI:5 in the last 168 hours  BNP (last 3 results) No results found for this basename: PROBNP:3 in the last 8760 hours CBG: No results found for this basename: GLUCAP:5 in the last 168 hours  Radiological Exams on Admission: Dg Chest 2 View  09/29/2011  *RADIOLOGY REPORT*  Clinical Data: Chest pain and shortness of breath.  CHEST - 2 VIEW  Comparison: 08/07/2010 chest radiograph and 06/22/2010 chest radiograph and chest CT.  Findings: The cardiomediastinal silhouette is unremarkable. Elevation of the right hemidiaphragm is again noted. There is no evidence of focal airspace disease, pulmonary edema, suspicious pulmonary nodule/mass, pleural effusion, or pneumothorax. No acute bony abnormalities are identified.  IMPRESSION: No evidence of acute cardiopulmonary disease.   Original Report Authenticated By: Rosendo Gros, M.D.     EKG: Independently reviewed.   Assessment/Plan Principal Problem:  *Chest pain Active Problems:  Morbid obesity  DM type 2 (diabetes mellitus, type 2)  HTN (hypertension)   Chest pain Patient will be observed overnight to rule out ACS,  First set of cardiac enzymes is negative, 12-lead EKG also negative. Chest x-ray is normal and she has normal d-dimer which ruled out PE. She mentioned that she has loose stools and indigestion, so this is might all  be secondary to indigestion and epigastric pain of GI origin. I think if 3 sets of cardiac enzymes are negative she will be appropriate to be discharged to a some workup as outpatient. Although she is high risk with hypertension/diabetes mellitus type 2 and family history, she is very ambulatory and complaining about no chest pain before.  Hypertension Blood pressure seems to be reasonably controlled with systolic blood pressure of 141. I will continue home medications.  Diabetes mellitus type 2 ALT hemoglobin A1c, patient is on glipizide,  will start patient on carbohydrate modified diet and utilize insulin sliding scale.  Morbid obesity Counseling about weight loss was provided, she might benefit from outpatient sleep study.  Code Status:full code Family Communication: husband and daughter is at bedside I explained the plan of care to both. Disposition Plan: observation/telemetry  Time spent:70 minutes  [facility] A [facility] Pager [phone]  If 7PM-7AM, please contact night-coverage [email or web address] Password [facility] 09/29/2011, 3:32 PM

## 2011-09-29 NOTE — Plan of Care (Signed)
Problem: Phase III Progression Outcomes Goal: Hemodynamically stable Outcome: Progressing Vital signs stable. Denies chest pain. Will continue to monitor. Goal: No anginal pain Outcome: Progressing Pain well controled with meds. Pain at patients tolerable level. Will continue to monitor and intervene if necessary.    Goal: Cath/PCI Path as indicated Outcome: Progressing Possible candidate for Cath/PCI, will await for orders Goal: Tolerating diet Outcome: Completed/Met Date Met:  09/29/11 Denies Nausea or vomiting.

## 2011-09-29 NOTE — Progress Notes (Signed)
Patient cc of CP 7/10. EKG taken, v/s taken, 4L [place] O2 started 0/10 reported. Patient pain subsided shortly after it started, NTG was not given. Pt to be moved to stepdown unit at [facility] for possible cath tomorrow. Will monitor pt unitl transfer.

## 2011-09-29 NOTE — ED Provider Notes (Signed)
History     CSN: [id number]  Arrival date & time 09/29/11  1131   First MD Initiated Contact with Patient 09/29/11 1321      Chief Complaint  Patient presents with  . Shortness of Breath  . Chest Pain    (Consider location/radiation/quality/duration/timing/severity/associated sxs/prior treatment) HPI Comments: Ms. Schuff states she awoke at 0200 with the sensation of something standing on her chest.  She reports feeling a strong chest pressure that radiated and caused an aching in her left arm.  She had experienced something similar "a while ago" but did not have a formal chest pain evaluation after that incident.  She states she has had trips within the last 2 wks to [facility] and to [place].  She denies leg pain or swelling but endorses experiencing some shortness of breath.  She denies any anxiety, fevers, cough, palpitations, or syncope.  Patient is a 51 y.o. female presenting with shortness of breath and chest pain. The history is provided by the patient. No language interpreter was used.  Shortness of Breath  The current episode started today. The problem occurs continuously. The problem has been gradually improving. The problem is severe (now mild). Associated symptoms include chest pain, chest pressure and shortness of breath. Pertinent negatives include no orthopnea, no fever, no rhinorrhea, no sore throat, no stridor, no cough and no wheezing. There was no intake of a foreign body. She was not exposed to toxic fumes. She has not inhaled smoke recently. She has had no prior hospitalizations. She has had no prior ICU admissions. She has had no prior intubations. Her past medical history does not include asthma, bronchiolitis, past wheezing, eczema or asthma in the family. She has been behaving normally. Urine output has been normal. The last void occurred less than 6 hours ago. There were no sick contacts. She has received no recent medical care.  Chest Pain Primary symptoms include  shortness of breath. Pertinent negatives for primary symptoms include no fever, no fatigue, no cough and no wheezing.  The patient's medical history does not include asthma.  Pertinent negatives for associated symptoms include no diaphoresis and no orthopnea.     Past Medical History  Diagnosis Date  . Hypertension   . Diabetes mellitus   . Thyroid activity decreased   . Asthma     Questionable  . ALLERGIC RHINITIS     Past Surgical History  Procedure Date  . Vesicovaginal fistula closure w/ tah     Family History  Problem Relation Age of Onset  . Heart disease Mother     has pacemaker  . Diabetes      strong family history  . Cancer Maternal Uncle     History  Substance Use Topics  . Smoking status: Former Smoker -- 0.8 packs/day for 36 years    Types: Cigarettes    Quit date: 07/04/2010  . Smokeless tobacco: Not on file  . Alcohol Use: Yes     VERY RARE    OB History    Grav Para Term Preterm Abortions TAB SAB Ect Mult Living                  Review of Systems  Constitutional: Negative for fever, chills, diaphoresis, activity change, appetite change and fatigue.  HENT: Negative for congestion, sore throat, rhinorrhea, trouble swallowing, neck pain and neck stiffness.   Eyes: Negative.   Respiratory: Positive for chest tightness and shortness of breath. Negative for apnea, cough, choking, wheezing and stridor.  Cardiovascular: Positive for chest pain. Negative for orthopnea.  Gastrointestinal: Negative.   Genitourinary: Negative.   Musculoskeletal: Negative.   Neurological: Negative.   Psychiatric/Behavioral: Negative.     Allergies  Ivp dye; Codeine; and Other  Home Medications   Current Outpatient Rx  Name Route Sig Dispense Refill  . CLONIDINE HCL 0.1 MG PO TABS Oral Take 0.1 mg by mouth 2 (two) times daily.    . FUROSEMIDE 40 MG PO TABS Oral Take 1 tablet by mouth Once daily as needed.    Marland Kitchen GLIPIZIDE 5 MG PO TABS Oral Take 1 tablet by mouth  Twice daily.    Marland Kitchen ONE-DAILY MULTI VITAMINS PO TABS Oral Take 1 tablet by mouth daily.      Marland Kitchen POTASSIUM CHLORIDE CRYS ER 20 MEQ PO TBCR Oral Take 1 tablet by mouth Daily.    . SUMATRIPTAN SUCCINATE 50 MG PO TABS Oral Take 1 tablet (50 mg total) by mouth every 2 (two) hours as needed for migraine. 10 tablet 0  . SYNTHROID 175 MCG PO TABS Oral Take 1 tablet by mouth Daily.    . TRIAMTERENE-HCTZ 75-50 MG PO TABS Oral Take 1 tablet by mouth daily.       BP 141/77  Pulse 74  Temp 98 F (36.7 C) (Oral)  Resp 17  SpO2 97%  Physical Exam  Nursing note and vitals reviewed. Constitutional: She is oriented to person, place, and time. She appears well-developed and well-nourished. No distress.  HENT:  Head: Normocephalic and atraumatic.  Right Ear: External ear normal.  Left Ear: External ear normal.  Nose: Nose normal.  Mouth/Throat: Oropharynx is clear and moist. No oropharyngeal exudate.  Eyes: Conjunctivae are normal. Pupils are equal, round, and reactive to light. Right eye exhibits no discharge. Left eye exhibits no discharge. No scleral icterus.  Neck: Normal range of motion. Neck supple. No JVD present. No tracheal deviation present. No thyromegaly present.  Cardiovascular: Normal rate, regular rhythm, S1 normal, S2 normal, normal heart sounds and intact distal pulses.   No extrasystoles are present. Exam reveals no gallop, no distant heart sounds and no friction rub.   No murmur heard. Pulmonary/Chest: Effort normal. No stridor. No respiratory distress. She has no decreased breath sounds. She has no wheezes. She has no rales. She exhibits no tenderness.       Breath sounds diffusely diminished (may be secondary to body habitus)  Abdominal: Soft. Bowel sounds are normal. She exhibits no distension and no mass. There is no tenderness. There is no rebound and no guarding.  Musculoskeletal: Normal range of motion. She exhibits no edema and no tenderness.  Neurological: She is alert and  oriented to person, place, and time. No cranial nerve deficit.  Skin: Skin is warm and dry. No rash noted. She is not diaphoretic. No erythema. No pallor.  Psychiatric: She has a normal mood and affect. Her behavior is normal. Judgment and thought content normal.    ED Course  Procedures (including critical care time)   Labs Reviewed  CBC  BASIC METABOLIC PANEL  D-DIMER, QUANTITATIVE   No results found.   No diagnosis found.   Date: 09/29/2011  Rate: 78 bpm  Rhythm: sinus  QRS Axis: normal  Intervals: normal  ST/T Wave abnormalities: normal  Conduction Disutrbances:none  Narrative Interpretation: note septal q waves without ST changes suggestive of acute ischemia  Old EKG Reviewed: none available      MDM  Pt presents for evaluation of chest pain.  She states  it did radiate into her left arm but is now just a dull pressure.  She has risk factors for both early CAD and thromboembolic event.  Plan CXR, trop, basic labs, and a ddimer.  Pt is allergic to IV contrast.  If ddimer is elevated will treat empirically for PE and order a V/Q scan.  As pt has never had a screening evaluation for CP and has multiple risk factors including obesity, htn, DM, and a pos fam hx, will seek admission for further evaluation.    1455.  Pt stable, NAD.  CXR and ddimer negative.  Trop neg x1.  Discussed with the on-call hospitalist.  Plan admit for further evaluation of chest pain.     Tobin Chad, MD 09/29/11 1459

## 2011-09-29 NOTE — ED Notes (Signed)
C/o shortness of breath and sternal chest pain waking her this morning, she has had symptoms before without dx of reason

## 2011-09-30 ENCOUNTER — Encounter (HOSPITAL_COMMUNITY)
Admission: EM | Disposition: A | Discharge status: Home / Self Care | Payer: Self-pay | Source: Home / Self Care | Attending: Cardiology

## 2011-09-30 DIAGNOSIS — I214 Non-ST elevation (NSTEMI) myocardial infarction: Secondary | ICD-10-CM

## 2011-09-30 DIAGNOSIS — R079 Chest pain, unspecified: Secondary | ICD-10-CM

## 2011-09-30 HISTORY — DX: Non-ST elevation (NSTEMI) myocardial infarction: I21.4

## 2011-09-30 HISTORY — PX: LEFT HEART CATHETERIZATION WITH CORONARY ANGIOGRAM: SHX5451

## 2011-09-30 LAB — BASIC METABOLIC PANEL
CO2: 26 mEq/L (ref 19–32)
Calcium: 9.8 mg/dL (ref 8.4–10.5)
Chloride: 92 mEq/L — ABNORMAL LOW (ref 96–112)
Glucose, Bld: 334 mg/dL — ABNORMAL HIGH (ref 70–99)
Sodium: 133 mEq/L — ABNORMAL LOW (ref 135–145)

## 2011-09-30 LAB — LIPID PANEL
HDL: 44 mg/dL (ref >39)
LDL Cholesterol: UNDETERMINED mg/dL (ref 0–99)
Total CHOL/HDL Ratio: 4.1 RATIO
Triglycerides: 585 mg/dL — ABNORMAL HIGH (ref <150)
VLDL: UNDETERMINED mg/dL (ref 0–40)

## 2011-09-30 LAB — GLUCOSE, CAPILLARY
Glucose-Capillary: 403 mg/dL — ABNORMAL HIGH (ref 70–99)
Glucose-Capillary: 365 mg/dL — ABNORMAL HIGH (ref 70–99)
Glucose-Capillary: 357 mg/dL — ABNORMAL HIGH (ref 70–99)

## 2011-09-30 LAB — CBC
MCHC: 33.3 g/dL (ref 30.0–36.0)
Platelets: 311 10*3/uL (ref 150–400)
RDW: 13.5 % (ref 11.5–15.5)
WBC: 13 10*3/uL — ABNORMAL HIGH (ref 4.0–10.5)

## 2011-09-30 LAB — HEPARIN LEVEL (UNFRACTIONATED): Heparin Unfractionated: <0.1 IU/mL — ABNORMAL LOW (ref 0.30–0.70)

## 2011-09-30 LAB — CARDIAC PANEL(CRET KIN+CKTOT+MB+TROPI)
CK, MB: 11.8 ng/mL (ref 0.3–4.0)
Relative Index: 4.7 — ABNORMAL HIGH (ref 0.0–2.5)
Relative Index: 4.1 — ABNORMAL HIGH (ref 0.0–2.5)
Total CK: 249 U/L — ABNORMAL HIGH (ref 7–177)
Total CK: 231 U/L — ABNORMAL HIGH (ref 7–177)
Troponin I: 1.28 ng/mL (ref <0.30)

## 2011-09-30 LAB — URINALYSIS, ROUTINE W REFLEX MICROSCOPIC
Glucose, UA: 250 mg/dL — AB
Leukocytes, UA: NEGATIVE
Nitrite: NEGATIVE
Specific Gravity, Urine: 1.019 (ref 1.005–1.030)
pH: 6.5 (ref 5.0–8.0)

## 2011-09-30 SURGERY — LEFT HEART CATHETERIZATION WITH CORONARY ANGIOGRAM
Anesthesia: LOCAL

## 2011-09-30 MED ORDER — ATORVASTATIN CALCIUM 40 MG PO TABS
40.0000 mg | ORAL_TABLET | Freq: Every day | ORAL | Status: DC
  Filled 2011-09-30 (×2): qty 1

## 2011-09-30 MED ORDER — LIVING WELL WITH DIABETES BOOK
Freq: Once | Status: AC
  Administered 2011-09-30: 16:00:00
  Filled 2011-09-30: qty 1

## 2011-09-30 MED ORDER — ONDANSETRON HCL 4 MG/2ML IJ SOLN: 4.0000 mg | Freq: Four times a day (QID) | INTRAMUSCULAR | Status: DC | PRN

## 2011-09-30 MED ORDER — ACETAMINOPHEN 325 MG PO TABS: 650.0000 mg | ORAL_TABLET | ORAL | Status: DC | PRN

## 2011-09-30 MED ORDER — FENTANYL CITRATE 0.05 MG/ML IJ SOLN
INTRAMUSCULAR | Status: AC
  Filled 2011-09-30: qty 2

## 2011-09-30 MED ORDER — MIDAZOLAM HCL 2 MG/2ML IJ SOLN
INTRAMUSCULAR | Status: AC
  Filled 2011-09-30: qty 2

## 2011-09-30 MED ORDER — PREDNISONE 50 MG PO TABS
60.0000 mg | ORAL_TABLET | Freq: Once | ORAL | Status: AC
  Administered 2011-09-30: 60 mg via ORAL
  Filled 2011-09-30: qty 1

## 2011-09-30 MED ORDER — VERAPAMIL HCL 2.5 MG/ML IV SOLN
INTRAVENOUS | Status: AC
  Filled 2011-09-30: qty 2

## 2011-09-30 MED ORDER — INSULIN ASPART 100 UNIT/ML ~~LOC~~ SOLN
0.0000 [IU] | Freq: Three times a day (TID) | SUBCUTANEOUS | Status: DC
  Administered 2011-10-01 (×2): 20 [IU] via SUBCUTANEOUS

## 2011-09-30 MED ORDER — SODIUM CHLORIDE 0.9 % IV SOLN: 250.0000 mL | INTRAVENOUS | Status: DC | PRN

## 2011-09-30 MED ORDER — SODIUM CHLORIDE 0.9 % IV SOLN: INTRAVENOUS | Status: AC

## 2011-09-30 MED ORDER — SODIUM CHLORIDE 0.9 % IJ SOLN: 3.0000 mL | INTRAMUSCULAR | Status: DC | PRN

## 2011-09-30 MED ORDER — METHYLPREDNISOLONE SODIUM SUCC 125 MG IJ SOLR
125.0000 mg | INTRAMUSCULAR | Status: AC
  Administered 2011-09-30: 125 mg via INTRAVENOUS
  Filled 2011-09-30 (×3): qty 2

## 2011-09-30 MED ORDER — HEPARIN BOLUS VIA INFUSION
3000.0000 [IU] | Freq: Once | INTRAVENOUS | Status: AC
  Administered 2011-09-30: 3000 [IU] via INTRAVENOUS
  Filled 2011-09-30: qty 3000

## 2011-09-30 MED ORDER — SODIUM CHLORIDE 0.9 % IV SOLN
INTRAVENOUS | Status: DC
  Administered 2011-09-30: 100 mL/h via INTRAVENOUS

## 2011-09-30 MED ORDER — FAMOTIDINE 20 MG PO TABS
20.0000 mg | ORAL_TABLET | ORAL | Status: AC
  Administered 2011-09-30: 20 mg via ORAL
  Filled 2011-09-30: qty 1

## 2011-09-30 MED ORDER — INSULIN ASPART 100 UNIT/ML ~~LOC~~ SOLN
0.0000 [IU] | Freq: Every day | SUBCUTANEOUS | Status: DC
  Administered 2011-09-30: 23:00:00 5 [IU] via SUBCUTANEOUS

## 2011-09-30 MED ORDER — ISOSORBIDE MONONITRATE ER 30 MG PO TB24
30.0000 mg | ORAL_TABLET | Freq: Every day | ORAL | Status: DC
  Administered 2011-09-30 – 2011-10-01 (×2): 30 mg via ORAL
  Filled 2011-09-30 (×2): qty 1

## 2011-09-30 MED ORDER — ASPIRIN 81 MG PO CHEW
324.0000 mg | CHEWABLE_TABLET | ORAL | Status: AC
  Administered 2011-09-30: 324 mg via ORAL
  Filled 2011-09-30: qty 4

## 2011-09-30 MED ORDER — PREDNISONE 50 MG PO TABS: 60.0000 mg | ORAL_TABLET | ORAL | Status: DC

## 2011-09-30 MED ORDER — NITROGLYCERIN 0.4 MG SL SUBL: 0.4000 mg | SUBLINGUAL_TABLET | SUBLINGUAL | Status: DC | PRN

## 2011-09-30 MED ORDER — SODIUM CHLORIDE 0.9 % IJ SOLN: 3.0000 mL | Freq: Two times a day (BID) | INTRAMUSCULAR | Status: DC

## 2011-09-30 MED ORDER — DIAZEPAM 5 MG PO TABS
5.0000 mg | ORAL_TABLET | ORAL | Status: AC
  Administered 2011-09-30: 5 mg via ORAL
  Filled 2011-09-30: qty 1

## 2011-09-30 MED ORDER — LIDOCAINE HCL (PF) 1 % IJ SOLN
INTRAMUSCULAR | Status: AC
  Filled 2011-09-30: qty 30

## 2011-09-30 MED ORDER — DIPHENHYDRAMINE HCL 50 MG/ML IJ SOLN
25.0000 mg | INTRAMUSCULAR | Status: AC
  Administered 2011-09-30: 25 mg via INTRAVENOUS
  Filled 2011-09-30: qty 1

## 2011-09-30 MED ORDER — NITROGLYCERIN 0.2 MG/ML ON CALL CATH LAB
INTRAVENOUS | Status: AC
  Filled 2011-09-30: qty 1

## 2011-09-30 MED ORDER — HEPARIN (PORCINE) IN NACL 2-0.9 UNIT/ML-% IJ SOLN
INTRAMUSCULAR | Status: AC
  Filled 2011-09-30: qty 2000

## 2011-09-30 MED ORDER — ASPIRIN EC 81 MG PO TBEC
81.0000 mg | DELAYED_RELEASE_TABLET | Freq: Every day | ORAL | Status: DC
  Administered 2011-10-01: 81 mg via ORAL
  Filled 2011-09-30: qty 1

## 2011-09-30 NOTE — Progress Notes (Signed)
CRITICAL VALUE ALERT  Critical value received:  Troponin 1.28   Date of notification:  09/30/11  Time of notification:  0808  Critical value read back:yes  Nurse who received alert:  Ninetta Lights   MD notified (1st page):  Dr. Melburn Popper  Time of first page:  0808  MD notified (2nd page):  Time of second page:  Responding MD:  Dr. Melburn Popper  Time MD responded:  [phone]

## 2011-09-30 NOTE — H&P (View-Only) (Signed)
 PROGRESS NOTE  Subjective:   [name] is a 51 yo with hx of DM ( poorly controlled)  Who was admitted last night for chest heaviness.  Her cardiac enzymes are mildly elevated.  She is currently pain free.  She has a hx of dye allergy.   Objective:    Vital Signs:   Temp:  [98 F (36.7 C)-99 F (37.2 C)] 98.7 F (37.1 C) (08/26 0642) Pulse Rate:  [68-88] 88  (08/26 0642) Resp:  [17-25] 20  (08/26 0642) BP: (133-193)/(74-93) 173/84 mmHg (08/26 0642) SpO2:  [95 %-100 %] 96 % (08/26 0642) Weight:  [343 lb 1.6 oz (155.629 kg)-347 lb 12.8 oz (157.76 kg)] 343 lb 1.6 oz (155.629 kg) (08/25 2307)  Last BM Date: 09/29/11   24-hour weight change: Weight change:   Weight trends: Filed Weights   09/29/11 1811 09/29/11 2307  Weight: 347 lb 12.8 oz (157.76 kg) 343 lb 1.6 oz (155.629 kg)    Intake/Output:        Physical Exam: BP 173/84  Pulse 88  Temp 98.7 F (37.1 C) (Oral)  Resp 20  Ht 5' 8" (1.727 m)  Wt 343 lb 1.6 oz (155.629 kg)  BMI 52.17 kg/m2  SpO2 96%  General: Vital signs reviewed and noted. Well-developed, well-nourished, in no acute distress; alert, appropriate and cooperative .  Head: Normocephalic, atraumatic.  Eyes: conjunctivae/corneas clear.  EOM's intact.   Throat: normal  Neck: Supple. Normal carotids. No JVD  Lungs:  Clear to auscultation  Heart: Regular rate,  With normal  S1 S2. No murmurs, gallops or rubs  Abdomen:  Soft, non-tender, non-distended with normoactive bowel sounds. No hepatomegaly. No rebound/guarding. No abdominal masses.  Extremities: Distal pedal pulses are 2+ .  No edema.    Neurologic: A&O X3, CN II - XII are grossly intact. Motor strength is 5/5 in the all 4 extremities.  Psych: Responds to questions appropriately with normal affect.    Labs: BMET:  Basename 09/30/11 0310 09/29/11 1352  NA 133* 134*  K 4.0 4.2  CL 92* 96  CO2 26 25  GLUCOSE 334* 324*  BUN 14 14  CREATININE 0.95 0.78  CALCIUM 9.8 9.8  MG -- --    PHOS -- --    Liver function tests: No results found for this basename: AST:2,ALT:2,ALKPHOS:2,BILITOT:2,PROT:2,ALBUMIN:2 in the last 72 hours No results found for this basename: LIPASE:2,AMYLASE:2 in the last 72 hours  CBC:  Basename 09/30/11 0310 09/29/11 1352  WBC 13.0* 13.5*  NEUTROABS -- --  HGB 13.2 12.8  HCT 39.6 38.2  MCV 87.4 87.4  PLT 311 333    Cardiac Enzymes:  Basename 09/29/11 1705  CKTOTAL 212*  CKMB 5.2*  TROPONINI 0.32*    Coagulation Studies:  Basename 09/29/11 2000  LABPROT 13.1  INR 0.97    Other: No components found with this basename: POCBNP:3  Basename 09/29/11 1352  DDIMER 0.36    Basename 09/29/11 1700  HGBA1C 11.5*    Basename 09/30/11 0300  CHOL 181  HDL 44  LDLCALC UNABLE TO CALCULATE IF TRIGLYCERIDE OVER 400 mg/dL  TRIG 585*  CHOLHDL 4.1    Basename 09/29/11 1705  TSH 10.015*  T4TOTAL --  T3FREE --  THYROIDAB --   No results found for this basename: VITAMINB12,FOLATE,FERRITIN,TIBC,IRON,RETICCTPCT in the last 72 hours    Tele:  NSR  Medications:    Infusions:    . sodium chloride 10 mL/hr at 09/29/11 2300  . heparin 1,900 Units/hr (09/30/11 0516)      Scheduled Medications:    . aspirin  324 mg Oral Once  . aspirin EC  81 mg Oral Daily  . atorvastatin  40 mg Oral q1800  . furosemide  40 mg Oral Daily  . glipiZIDE  5 mg Oral QAC breakfast  . heparin  3,000 Units Intravenous Once  . heparin  4,000 Units Intravenous Once  . insulin aspart  0-15 Units Subcutaneous TID WC  . insulin aspart  5 Units Subcutaneous Once  . levothyroxine  175 mcg Oral QAC breakfast  . metoprolol tartrate  25 mg Oral BID  . morphine  4 mg Intravenous Once  . multivitamin with minerals  1 tablet Oral Daily  . potassium chloride SA  20 mEq Oral Daily  . sodium chloride  3 mL Intravenous Q12H  . triamterene-hydrochlorothiazide  1 tablet Oral Daily  . DISCONTD: aspirin EC  325 mg Oral Daily  . DISCONTD: cloNIDine  0.1 mg Oral BID   . DISCONTD: enoxaparin (LOVENOX) injection  40 mg Subcutaneous Q24H  . DISCONTD: multivitamin  1 tablet Oral Daily  . DISCONTD: nitroGLYCERIN        Assessment/ Plan:    CAD/ NSTEMI:   Troponin levels continue to rise.   For cath this afternoon.  Will need to premedicate with steriods, benadrly, pepcid.  I have explained the risks, benefits, and options re: cath.  She understands and agrees to proceed.  Morbid obesity (09/29/2011) She will need to work on a diet and exercise program  DM type 2 (diabetes mellitus, type 2) (09/29/2011) She will need tighter control of her diabetes.  HTN (hypertension) (09/29/2011)   Disposition: for cath, possible PCI this afternoon.  Length of Stay: 1  Aerabella Galasso J. Reegan Bouffard, Jr., MD, FACC 09/30/2011, 8:03 AM Office [phone] Pager [phone]    

## 2011-09-30 NOTE — Interval H&P Note (Signed)
History and Physical Interval Note:  09/30/2011 5:13 PM  [name]  has presented today for cardiac cath  with the diagnosis of chest pain/NSTEMI.  The various methods of treatment have been discussed with the patient and family. After consideration of risks, benefits and other options for treatment, the patient has consented to  Procedure(s) (LRB): LEFT HEART CATHETERIZATION WITH CORONARY ANGIOGRAM (N/A) as a surgical intervention .  The patient's history has been reviewed, patient examined, no change in status, stable for surgery.  I have reviewed the patient's chart and labs.  Questions were answered to the patient's satisfaction.     Jansen Sciuto

## 2011-09-30 NOTE — Progress Notes (Signed)
CRITICAL VALUE ALERT  Critical value received:  CKMB 11.8   Date of notification:  09/30/11  Time of notification:  0808  Critical value read back:yes  Nurse who received alert:  Ninetta Lights   MD notified (1st page):  Dr. Melburn Popper  Time of first page:  0808  MD notified (2nd page):  Time of second page:  Responding MD:  Dr. Melburn Popper  Time MD responded:  [phone]

## 2011-09-30 NOTE — Progress Notes (Signed)
PROGRESS NOTE  Subjective:   [name] is a 51 yo with hx of DM ( poorly controlled)  Who was admitted last night for chest heaviness.  Her cardiac enzymes are mildly elevated.  She is currently pain free.  She has a hx of dye allergy.   Objective:    Vital Signs:   Temp:  [98 F (36.7 C)-99 F (37.2 C)] 98.7 F (37.1 C) (08/26 0642) Pulse Rate:  [68-88] 88  (08/26 0642) Resp:  [17-25] 20  (08/26 0642) BP: (133-193)/(74-93) 173/84 mmHg (08/26 0642) SpO2:  [95 %-100 %] 96 % (08/26 0642) Weight:  [343 lb 1.6 oz (155.629 kg)-347 lb 12.8 oz (157.76 kg)] 343 lb 1.6 oz (155.629 kg) (08/25 2307)  Last BM Date: 09/29/11   24-hour weight change: Weight change:   Weight trends: Filed Weights   09/29/11 1811 09/29/11 2307  Weight: 347 lb 12.8 oz (157.76 kg) 343 lb 1.6 oz (155.629 kg)    Intake/Output:        Physical Exam: BP 173/84  Pulse 88  Temp 98.7 F (37.1 C) (Oral)  Resp 20  Ht 5\' 8"  (1.727 m)  Wt 343 lb 1.6 oz (155.629 kg)  BMI 52.17 kg/m2  SpO2 96%  General: Vital signs reviewed and noted. Well-developed, well-nourished, in no acute distress; alert, appropriate and cooperative .  Head: Normocephalic, atraumatic.  Eyes: conjunctivae/corneas clear.  EOM's intact.   Throat: normal  Neck: Supple. Normal carotids. No JVD  Lungs:  Clear to auscultation  Heart: Regular rate,  With normal  S1 S2. No murmurs, gallops or rubs  Abdomen:  Soft, non-tender, non-distended with normoactive bowel sounds. No hepatomegaly. No rebound/guarding. No abdominal masses.  Extremities: Distal pedal pulses are 2+ .  No edema.    Neurologic: A&O X3, CN II - XII are grossly intact. Motor strength is 5/5 in the all 4 extremities.  Psych: Responds to questions appropriately with normal affect.    Labs: BMET:  Basename 09/30/11 0310 09/29/11 1352  NA 133* 134*  K 4.0 4.2  CL 92* 96  CO2 26 25  GLUCOSE 334* 324*  BUN 14 14  CREATININE 0.95 0.78  CALCIUM 9.8 9.8  MG -- --    PHOS -- --    Liver function tests: No results found for this basename: AST:2,ALT:2,ALKPHOS:2,BILITOT:2,PROT:2,ALBUMIN:2 in the last 72 hours No results found for this basename: LIPASE:2,AMYLASE:2 in the last 72 hours  CBC:  Basename 09/30/11 0310 09/29/11 1352  WBC 13.0* 13.5*  NEUTROABS -- --  HGB 13.2 12.8  HCT 39.6 38.2  MCV 87.4 87.4  PLT 311 333    Cardiac Enzymes:  Basename 09/29/11 1705  CKTOTAL 212*  CKMB 5.2*  TROPONINI 0.32*    Coagulation Studies:  Basename 09/29/11 2000  LABPROT 13.1  INR 0.97    Other: No components found with this basename: POCBNP:3  Basename 09/29/11 1352  DDIMER 0.36    Basename 09/29/11 1700  HGBA1C 11.5*    Basename 09/30/11 0300  CHOL 181  HDL 44  LDLCALC UNABLE TO CALCULATE IF TRIGLYCERIDE OVER 400 mg/dL  TRIG 161*  CHOLHDL 4.1    Basename 09/29/11 1705  TSH 10.015*  T4TOTAL --  T3FREE --  THYROIDAB --   No results found for this basename: VITAMINB12,FOLATE,FERRITIN,TIBC,IRON,RETICCTPCT in the last 72 hours    Tele:  NSR  Medications:    Infusions:    . sodium chloride 10 mL/hr at 09/29/11 2300  . heparin 1,900 Units/hr (09/30/11 0516)  Scheduled Medications:    . aspirin  324 mg Oral Once  . aspirin EC  81 mg Oral Daily  . atorvastatin  40 mg Oral q1800  . furosemide  40 mg Oral Daily  . glipiZIDE  5 mg Oral QAC breakfast  . heparin  3,000 Units Intravenous Once  . heparin  4,000 Units Intravenous Once  . insulin aspart  0-15 Units Subcutaneous TID WC  . insulin aspart  5 Units Subcutaneous Once  . levothyroxine  175 mcg Oral QAC breakfast  . metoprolol tartrate  25 mg Oral BID  . morphine  4 mg Intravenous Once  . multivitamin with minerals  1 tablet Oral Daily  . potassium chloride SA  20 mEq Oral Daily  . sodium chloride  3 mL Intravenous Q12H  . triamterene-hydrochlorothiazide  1 tablet Oral Daily  . DISCONTD: aspirin EC  325 mg Oral Daily  . DISCONTD: cloNIDine  0.1 mg Oral BID   . DISCONTD: enoxaparin (LOVENOX) injection  40 mg Subcutaneous Q24H  . DISCONTD: multivitamin  1 tablet Oral Daily  . DISCONTD: nitroGLYCERIN        Assessment/ Plan:    CAD/ NSTEMI:   Troponin levels continue to rise.   For cath this afternoon.  Will need to premedicate with steriods, benadrly, pepcid.  I have explained the risks, benefits, and options re: cath.  She understands and agrees to proceed.  Morbid obesity (09/29/2011) She will need to work on a diet and exercise program  DM type 2 (diabetes mellitus, type 2) (09/29/2011) She will need tighter control of her diabetes.  HTN (hypertension) (09/29/2011)   Disposition: for cath, possible PCI this afternoon.  Length of Stay: 1  Vesta Mixer, Montez Hageman., MD, [facility] 09/30/2011, 8:03 AM Office [phone] Pager [phone]

## 2011-09-30 NOTE — Care Management Note (Unsigned)
    Page 1 of 1   09/30/2011     3:52:37 PM   CARE MANAGEMENT NOTE 09/30/2011  Patient:  [name], [name]   Account Number:  [id number]  Date Initiated:  09/30/2011  Documentation initiated by:  Shalunda Lindh  Subjective/Objective Assessment:   PT ADM ON 09/29/11 WITH CHEST PAIN WITH POSITIVE TROPONINS. PTA, PT INDEPENDENT, LIVES ALONE.     Action/Plan:   PT SCHEDULED FOR CATH TODAY.  WILL FOLLOW FOR DC NEEDS AS PT PROGRESSES.   Anticipated DC Date:  10/01/2011   Anticipated DC Plan:  HOME/SELF CARE      DC Planning Services  CM consult      Choice offered to / List presented to:             Status of service:  In process, will continue to follow Medicare Important Message given?   (If response is "NO", the following Medicare IM given date fields will be blank) Date Medicare IM given:   Date Additional Medicare IM given:    Discharge Disposition:    Per UR Regulation:  Reviewed for med. necessity/level of care/duration of stay  If discussed at Long Length of Stay Meetings, dates discussed:    Comments:

## 2011-09-30 NOTE — CV Procedure (Signed)
    Cardiac Catheterization Operative Report  [name] [id number] 8/26/20136:52 PM Ron Parker, MD  Procedure Performed:  1. Left Heart Catheterization 2. Selective Coronary Angiography 3. Left ventricular angiogram  Operator: Verne Carrow, MD  Arterial access site:  Left radial artery.   Indication:  One episode of chest pain, mild elevation troponin.                                      Procedure Details: The risks, benefits, complications, treatment options, and expected outcomes were discussed with the patient. The patient and/or family concurred with the proposed plan, giving informed consent. The patient was brought to the cath lab after IV hydration was begun and oral premedication was given. The patient was further sedated with Versed and Fentanyl. The left wrist was assessed with an Allens test which was positive. The left wrist was prepped and draped in a sterile fashion. 1% lidocaine was used for local anesthesia. Using the modified Seldinger access technique, a 5 French sheath was placed in the left radial artery. 3 mg Verapamil was given through the sheath. 5000 units IV heparin was given. The JR4 catheter was used to engage the RCA. Angiography was performed. 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 The LAD was engaged with a JL3.5 catheter. The Circumflex was non-selectively injected with a Jacky catheter. I was also able to get non-selective images with a JL5.  A pigtail catheter was used to perform a left ventricular angiogram. The sheath was removed from the left radial artery and a Terumo hemostasis band was applied at the arteriotomy site on the left wrist.    There were no immediate complications. The patient was taken to the recovery area in stable condition.   Hemodynamic Findings: Central aortic pressure: 145/85 Left ventricular pressure:  145/4/10  Angiographic Findings:  Left main: The LAD and Circumflex arise from different ostia.   Left Anterior Descending Artery: No angiographic evidence of disease.   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 No angiographic evidence of disease.   Right Coronary Artery: Moderate sized non-dominant vessel with no evidence of disease.   Left Ventricular Angiogram: LVEF= 60-65%.   Impression: 1. No angiographic evidence of coronary artery disease.  2. Abnormal troponin, mild elevation, possibly secondary to coronary artery vasospasm.  3. Preserved LV systolic function  Recommendations: No further ischemic cardiac workup. Will add Imdur for possible vasospasm. D-dimer is normal so probability of PE is low.        Complications:  None. The patient tolerated the procedure well.

## 2011-09-30 NOTE — Progress Notes (Signed)
ANTICOAGULATION CONSULT NOTE   Pharmacy Consult for IV Heparin Indication: chest pain/ACS  Allergies  Allergen Reactions  . Ivp Dye (Iodinated Diagnostic Agents) Swelling    "skin came off"  . Codeine Itching  . Other     NO PLASTIC TAPE    Patient Measurements: Height: 5\' 8"  (172.7 cm) Weight: 343 lb 1.6 oz (155.629 kg) IBW/kg (Calculated) : 63.9  Heparin Dosing Weight: 103 kg  Vital Signs: Temp: 99 F (37.2 C) (08/25 2307) Temp src: Oral (08/25 2307) BP: 138/88 mmHg (08/25 2307) Pulse Rate: 79  (08/25 2307)  Labs:  Basename 09/30/11 0310 09/29/11 2000 09/29/11 1705 09/29/11 1352  HGB 13.2 -- -- 12.8  HCT 39.6 -- -- 38.2  PLT 311 -- -- 333  APTT -- 26 -- --  LABPROT -- 13.1 -- --  INR -- 0.97 -- --  HEPARINUNFRC <0.10* -- -- --  CREATININE -- -- -- 0.78  CKTOTAL -- -- 212* --  CKMB -- -- 5.2* --  TROPONINI -- -- 0.32* --    Estimated Creatinine Clearance: 132.1 ml/min (by C-G formula based on Cr of 0.78).  Assessment: 51 YO Female with chest pain for Heparin   Goal of Therapy:  Heparin level 0.3-0.7 units/ml Monitor platelets by anticoagulation protocol: Yes   Plan:  Heparin 3000 units IV bolus, increase Heparin 1900 units/hr Check heparin level in 6 hours.  Eddie Candle 09/30/2011,4:21 AM

## 2011-09-30 NOTE — Progress Notes (Signed)
ANTICOAGULATION CONSULT NOTE - Follow Up Consult  Pharmacy Consult for Heparin Indication: chest pain/ACS/ NSTEMI  Allergies  Allergen Reactions  . Ivp Dye (Iodinated Diagnostic Agents) Swelling    "skin came off"  . Codeine Itching  . Other     NO PLASTIC TAPE    Patient Measurements: Height: 5\' 8"  (172.7 cm) Weight: 343 lb 1.6 oz (155.629 kg) IBW/kg (Calculated) : 63.9  Heparin Dosing Weight: 103 kg  Vital Signs: Temp: 98.5 F (36.9 C) (08/26 0745) Temp src: Oral (08/26 0745) BP: 116/70 mmHg (08/26 1008) Pulse Rate: 89  (08/26 1008)  Labs:  Basename 09/30/11 1132 09/30/11 0705 09/30/11 0310 09/29/11 2000 09/29/11 1705 09/29/11 1352  HGB -- -- 13.2 -- -- 12.8  HCT -- -- 39.6 -- -- 38.2  PLT -- -- 311 -- -- 333  APTT -- -- -- 26 -- --  LABPROT -- -- -- 13.1 -- --  INR -- -- -- 0.97 -- --  HEPARINUNFRC <0.10* -- <0.10* -- -- --  CREATININE -- -- 0.95 -- -- 0.78  CKTOTAL -- 249* -- -- 212* --  CKMB -- 11.8* -- -- 5.2* --  TROPONINI -- 1.28* -- -- 0.32* --    Estimated Creatinine Clearance: 111.3 ml/min (by C-G formula based on Cr of 0.95).  Assessment:   Heparin level remains <0.1 on 1900 units/hr.   For cardiac cath scheduled for 2pm today, so will not re-bolus.  Goal of Therapy:  Heparin level 0.3-0.7 units/ml Monitor platelets by anticoagulation protocol: Yes   Plan:   Increase heparin drip to 2300 units/hr.  Follow-up post-cath.  Dennie Fetters, RPh Pager: [phone] 09/30/2011,12:13 PM

## 2011-09-30 NOTE — Progress Notes (Signed)
Inpatient Diabetes Program Recommendations  AACE/ADA: New Consensus Statement on Inpatient Glycemic Control (2013)  Target Ranges:  Prepandial:   less than 140 mg/dL      Peak postprandial:   less than 180 mg/dL (1-2 hours)      Critically ill patients:  140 - 180 mg/dL   Reason for Visit: Hyperglycemia sustained  Inpatient Diabetes Program Recommendations Insulin - Basal:  Pt needs basal lantus.  Please order 20 units lantus (at minimum starting dose of .2 units per kg, pt would need 30 units) HgbA1C: high at 11.5 % Will order some education for this patient while here and talk with patient. Note: Thank you, Lenor Coffin, RN, CNS, Diabetes Coordinator [phone])

## 2011-10-01 ENCOUNTER — Encounter (HOSPITAL_COMMUNITY): Payer: Self-pay | Admitting: Cardiology

## 2011-10-01 DIAGNOSIS — I10 Essential (primary) hypertension: Secondary | ICD-10-CM

## 2011-10-01 DIAGNOSIS — Z7189 Other specified counseling: Secondary | ICD-10-CM

## 2011-10-01 DIAGNOSIS — I517 Cardiomegaly: Secondary | ICD-10-CM

## 2011-10-01 DIAGNOSIS — E119 Type 2 diabetes mellitus without complications: Secondary | ICD-10-CM

## 2011-10-01 LAB — GLUCOSE, CAPILLARY
Glucose-Capillary: 390 mg/dL — ABNORMAL HIGH (ref 70–99)
Glucose-Capillary: 365 mg/dL — ABNORMAL HIGH (ref 70–99)

## 2011-10-01 LAB — HEPATIC FUNCTION PANEL
ALT: 30 U/L (ref 0–35)
Alkaline Phosphatase: 95 U/L (ref 39–117)
Bilirubin, Direct: <0.1 mg/dL (ref 0.0–0.3)
Total Bilirubin: 0.3 mg/dL (ref 0.3–1.2)

## 2011-10-01 LAB — URINE CULTURE

## 2011-10-01 MED ORDER — SUMATRIPTAN SUCCINATE 50 MG PO TABS: 50.0000 mg | ORAL_TABLET | ORAL | Status: DC | PRN

## 2011-10-01 MED ORDER — INSULIN GLARGINE 100 UNIT/ML ~~LOC~~ SOLN: 20.0000 [IU] | Freq: Every day | SUBCUTANEOUS | Status: DC

## 2011-10-01 MED ORDER — INSULIN ASPART 100 UNIT/ML ~~LOC~~ SOLN
15.0000 [IU] | Freq: Once | SUBCUTANEOUS | Status: AC
  Administered 2011-10-01: 15 [IU] via SUBCUTANEOUS

## 2011-10-01 MED ORDER — TRIAMTERENE-HCTZ 75-50 MG PO TABS: 0.5000 | ORAL_TABLET | Freq: Every day | ORAL | Status: DC

## 2011-10-01 MED ORDER — DIPHENHYDRAMINE HCL 25 MG PO CAPS
25.0000 mg | ORAL_CAPSULE | Freq: Once | ORAL | Status: AC
  Administered 2011-10-01: 11:00:00 25 mg via ORAL
  Filled 2011-10-01: qty 1

## 2011-10-01 MED ORDER — ASPIRIN 81 MG PO TBEC: 81.0000 mg | DELAYED_RELEASE_TABLET | Freq: Every day | ORAL | Status: AC

## 2011-10-01 MED ORDER — ISOSORBIDE MONONITRATE ER 30 MG PO TB24: 30.0000 mg | ORAL_TABLET | Freq: Every day | ORAL | Status: DC

## 2011-10-01 MED ORDER — ATORVASTATIN CALCIUM 40 MG PO TABS: 40.0000 mg | ORAL_TABLET | Freq: Every day | ORAL | Status: DC

## 2011-10-01 MED ORDER — NITROGLYCERIN 0.4 MG SL SUBL: 0.4000 mg | SUBLINGUAL_TABLET | SUBLINGUAL | Status: AC | PRN

## 2011-10-01 MED ORDER — LISINOPRIL 10 MG PO TABS: 10.0000 mg | ORAL_TABLET | Freq: Every day | ORAL | Status: DC

## 2011-10-01 MED ORDER — LIVING WELL WITH DIABETES BOOK
Freq: Once | Status: AC
  Administered 2011-10-01: 11:00:00
  Filled 2011-10-01: qty 1

## 2011-10-01 NOTE — Progress Notes (Signed)
Echocardiogram 2D Echocardiogram has been performed.  [name] 10/01/2011, 10:19 AM

## 2011-10-01 NOTE — Progress Notes (Signed)
Patient demonstrated competence with drawing insulin from a vial and injecting dose.

## 2011-10-01 NOTE — Progress Notes (Signed)
PROGRESS NOTE  Subjective:   [name] is a 51 yo with hx of DM ( poorly controlled)  Who was admitted  for chest heaviness.  Her cardiac enzymes are mildly elevated.  She is currently pain free.  Cath yesterday revealed normal epicardial coronary arteries.  No LV EF    Objective:    Vital Signs:   Temp:  [97.6 F (36.4 C)-98.6 F (37 C)] 97.6 F (36.4 C) (08/27 0630) Pulse Rate:  [58-92] 92  (08/27 0630) Resp:  [15-24] 20  (08/27 0630) BP: (111-130)/(63-92) 115/63 mmHg (08/27 0630) SpO2:  [93 %-97 %] 95 % (08/27 0630) Weight:  [339 lb 1.1 oz (153.8 kg)] 339 lb 1.1 oz (153.8 kg) (08/27 0630)  Last BM Date: 09/29/11   24-hour weight change: Weight change: -8 lb 11.7 oz (-3.96 kg)  Weight trends: Filed Weights   09/29/11 1811 09/29/11 2307 10/01/11 0630  Weight: 347 lb 12.8 oz (157.76 kg) 343 lb 1.6 oz (155.629 kg) 339 lb 1.1 oz (153.8 kg)    Intake/Output:  08/26 0701 - 08/27 0700 In: 120 [P.O.:120] Out: -      Physical Exam: BP 115/63  Pulse 92  Temp 97.6 F (36.4 C) (Oral)  Resp 20  Ht 5\' 8"  (1.727 m)  Wt 339 lb 1.1 oz (153.8 kg)  BMI 51.56 kg/m2  SpO2 95%  General: Vital signs reviewed and noted. Well-developed, well-nourished, in no acute distress; alert, appropriate and cooperative .  Head: Normocephalic, atraumatic.  Eyes: conjunctivae/corneas clear.  EOM's intact.   Throat: normal  Neck: Supple. Normal carotids. No JVD  Lungs:  Clear to auscultation  Heart: Regular rate,  With normal  S1 S2. No murmurs, gallops or rubs  Abdomen:  Soft, non-tender, non-distended with normoactive bowel sounds. No hepatomegaly. No rebound/guarding. No abdominal masses.  Extremities: Distal pedal pulses are 2+ .  No edema.  Left radial cath site appears normal. Good pulses.  Neurologic: A&O X3, CN II - XII are grossly intact. Motor strength is 5/5 in the all 4 extremities.  Psych: Responds to questions appropriately with normal affect.    Labs: BMET:  Basename  09/30/11 0310 09/29/11 1352  NA 133* 134*  K 4.0 4.2  CL 92* 96  CO2 26 25  GLUCOSE 334* 324*  BUN 14 14  CREATININE 0.95 0.78  CALCIUM 9.8 9.8  MG -- --  PHOS -- --    Liver function tests: No results found for this basename: AST:2,ALT:2,ALKPHOS:2,BILITOT:2,PROT:2,ALBUMIN:2 in the last 72 hours No results found for this basename: LIPASE:2,AMYLASE:2 in the last 72 hours  CBC:  Basename 09/30/11 0310 09/29/11 1352  WBC 13.0* 13.5*  NEUTROABS -- --  HGB 13.2 12.8  HCT 39.6 38.2  MCV 87.4 87.4  PLT 311 333    Cardiac Enzymes:  Basename 09/30/11 1132 09/30/11 0705 09/29/11 1705  CKTOTAL 231* 249* 212*  CKMB 9.4* 11.8* 5.2*  TROPONINI 0.82* 1.28* 0.32*    Coagulation Studies:  Basename 09/29/11 2000  LABPROT 13.1  INR 0.97    Other: No components found with this basename: POCBNP:3  Basename 09/29/11 1352  DDIMER 0.36    Basename 09/29/11 1700  HGBA1C 11.5*    Basename 09/30/11 0300  CHOL 181  HDL 44  LDLCALC UNABLE TO CALCULATE IF TRIGLYCERIDE OVER 400 mg/dL  TRIG 324*  CHOLHDL 4.1    Basename 09/29/11 1705  TSH 10.015*  T4TOTAL --  T3FREE --  THYROIDAB --   No results found for this basename: VITAMINB12,FOLATE,FERRITIN,TIBC,IRON,RETICCTPCT  in the last 72 hours    Tele:  NSR  Medications:    Infusions:    . sodium chloride    . DISCONTD: sodium chloride 10 mL/hr at 09/29/11 2300  . DISCONTD: sodium chloride 100 mL/hr (09/30/11 1002)  . DISCONTD: heparin 2,300 Units/hr (09/30/11 1344)    Scheduled Medications:    . aspirin  324 mg Oral Pre-Cath  . aspirin EC  81 mg Oral Daily  . atorvastatin  40 mg Oral q1800  . diazepam  5 mg Oral On Call  . diphenhydrAMINE  25 mg Intravenous On Call  . famotidine  20 mg Oral Pre-Cath  . fentaNYL      . furosemide  40 mg Oral Daily  . glipiZIDE  5 mg Oral QAC breakfast  . heparin      . insulin aspart  0-20 Units Subcutaneous TID WC  . insulin aspart  0-5 Units Subcutaneous QHS  . insulin  aspart  15 Units Subcutaneous Once  . isosorbide mononitrate  30 mg Oral Daily  . levothyroxine  175 mcg Oral QAC breakfast  . lidocaine      . living well with diabetes book   Does not apply Once  . methylPREDNISolone (SOLU-MEDROL) injection  125 mg Intravenous Pre-Cath  . metoprolol tartrate  25 mg Oral BID  . midazolam      . midazolam      . multivitamin with minerals  1 tablet Oral Daily  . nitroGLYCERIN      . potassium chloride SA  20 mEq Oral Daily  . predniSONE  60 mg Oral Once  . triamterene-hydrochlorothiazide  1 tablet Oral Daily  . verapamil      . DISCONTD: insulin aspart  0-15 Units Subcutaneous TID WC  . DISCONTD: predniSONE  60 mg Oral Pre-Cath  . DISCONTD: sodium chloride  3 mL Intravenous Q12H  . DISCONTD: sodium chloride  3 mL Intravenous Q12H    Assessment/ Plan:    1. NSTEMI:   No CAD.  She may have had coronary spasm or may have small vessel disease due to her diabetes.  No culprit lesions that would require revascularization.  Needs aggressive control of her diabetes and hypertriglyceridemia She will see Dr. Kizzie Bane today or tomorrow. She has ambulated without problems.   DC on:   ASA 81 Lipitor 40 Glucotrol 5 mg a day Synthroid 175 mcg a day Lisinopril 10 mg a day Triamterene-HCTZ 75-50 - 1/2 tablet a day ( lower dose) NTG 0.4 mg SL as needed Lasix 40 PRN ( home medication) Kdur 20 QD PRN (home medication)  DC clonidine Use Imitrex with caution since there is evidence of possible coronary spasm ( she has not had any in 6 months)  Morbid obesity (09/29/2011) She will need to work on a diet and exercise program  DM type 2 (diabetes mellitus, type 2) (09/29/2011) She will need tighter control of her diabetes. Will have the diabetic nurse see her.  She will see Dr. Lorinda Creed today or tomorrow.  HTN (hypertension) (09/29/2011)- BP is well controlled here in the hospital.  Will DC clonidine and start Lisinopril which will be better for her  diabetes.   Disposition:  For DC today.    Length of Stay: 2  Vesta Mixer, Montez Hageman., MD, [facility] 10/01/2011, 7:40 AM Office [phone] Pager [phone]

## 2011-10-01 NOTE — Discharge Summary (Signed)
Discharge Summary   Patient ID: [name] MRN: [id number], DOB/AGE: 03-13-1960 51 y.o.  Primary MD: Ron Parker, MD Primary Cardiologist: None Admit date: 09/29/2011 D/C date:     10/01/2011      Primary Discharge Diagnoses:  1. NSTEMI  - Cath revealed no angiographic evidence of CAD, EF 60-65%  - ? Coronary spasm or small vessel disease due to diabetes  - Started on Imdur and recommend aggressive risk factor modification  - F/u with PCP  2. Hypothyroidism  - TSH 10.015 on synthroid  - F/u with PCPhistory  3. Diabetes Mellitus, Type 2, Uncontrolled  - A1C 11.5  - F/u with PCP  4. Hypertension  - BP well controlled  - Stop Clonidine and Start Lisinopril  5. Hypertriglyceridemia  - Triglycerides 585  - Initiated on statin, will need f/u Lipids/LFTs in 6-8wks  - F/u with PCP  6. ?OSA  - Recommend outpatient sleep study  Secondary Discharge Diagnoses:  1. History of tobacco abuse -  Quit 2011 2. Obesity 3. Asthma 4. 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 IV dye allergy   Allergies Allergies  Allergen Reactions  . Ivp Dye (Iodinated Diagnostic Agents) Swelling    "skin came off"  . Codeine Itching  . Other     NO PLASTIC TAPE    Diagnostic Studies/Procedures:   09/30/11 - Cardiac Cath Hemodynamic Findings:  Central aortic pressure: 145/85  Left ventricular pressure: 145/4/10  Angiographic Findings:  Left main: The LAD and Circumflex arise from different ostia.  Left Anterior Descending Artery: No angiographic evidence of disease.  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 No angiographic evidence of disease.  Right Coronary Artery: Moderate sized non-dominant vessel with no evidence of disease.  Left Ventricular Angiogram: LVEF= 60-65%.  Impression:  1. No angiographic evidence of coronary artery disease.  2. Abnormal troponin, mild elevation, possibly  secondary to coronary artery vasospasm.  3. Preserved LV systolic function  Recommendations: No further ischemic cardiac workup. Will add Imdur for possible vasospasm. D-dimer is normal so probability of PE is low.   10/01/11 - Echo Study Conclusions: - Left ventricle: The cavity size was normal. Wall thickness was increased in a pattern of mild LVH. Systolic function was normal. The estimated ejection fraction was in the range of 55% to 60%. Wall motion was normal; there were no regional wall motion abnormalities. Doppler parameters are consistent with abnormal left ventricular relaxation (grade 1 diastolic dysfunction). - Aortic valve: Poorly visualized. There was no stenosis. - Mitral valve: No significant regurgitation. - Right ventricle: The cavity size was normal. Systolic function was normal. - Pulmonary arteries: No complete TR doppler jet so unable to estimate PA systolic pressure. - Systemic veins: IVC was poorly visualized. Impressions: Technically difficult study with poor acoustic windows. Normal LV size and systolic function, mild LV hypertrophy. EF 55-60%. Normal RV size and systolic function. No significant valvular abnormalities.   History of Present Illness: 51 y.o. female w/ the above medical problems who transferred from [facility] to [facility] on 09/30/11 for NSTEMI. On day of presentation she awoke with chest pain described as a heaviness with radiating to her left arm that was not improved with indigestion prompting her to seek medical attention.  Hospital Course: At [facility] ED, EKG revealed NSR with no acute ST/T changes. CXR was without acute cardiopulmonary abnormalities. Labs were significant for troponin 0.03--> 0.32, DDimer 0.36, WBC 13.5.  Given her increase in cardiac enzymes and cardiac risk factors she was placed on Heparin drip and transferred to [facility] for cardiac catheterization. Cardiac enzymes were cycled with peak troponin 1.28.  She was premedicated for her dye allergy and underwent cath revealing no angiographic evidence of CAD and EF 60-65%. There was question of possible coronary artery vasospasm as cause for NSTEMI for which she was placed on Imdur. She tolerated the procedure well without complications. Recommendations were made for aggressive risk factor modification and follow up with her primary care provider.  A1C was significantly elevated at 11.5 for which she was evaluated by diabetes management and placed on Lantus. Lipid profile revealed Triglycerides of 585. She was initiated on a statin and will need to follow up lipid panel and LFTs in 6-8wks. TSH level was 10.015 on synthroid. She will need to follow up with her primary care provider regarding her diabetes, hypertriglyceridemia, and hypothyroidism.   She was seen and evaluated by Dr. Elease Hashimoto who felt she was stable for discharge home with plans for follow up as scheduled below.  Discharge Vitals: Blood pressure 96/70, pulse 79, temperature 97.7 F (36.5 C), temperature source Oral, resp. rate 19, height 5\' 8"  (1.727 m), weight 339 lb 1.1 oz (153.8 kg), SpO2 96.00%.  Labs: Component Value Date   WBC 13.0* 09/30/2011   HGB 13.2 09/30/2011   HCT 39.6 09/30/2011   MCV 87.4 09/30/2011   PLT 311 09/30/2011    Lab 09/30/11 0310  NA 133*  K 4.0  CL 92*  CO2 26  BUN 14  CREATININE 0.95  CALCIUM 9.8  GLUCOSE 334*     10/01/2011 12:16  Alkaline Phosphatase 95  Albumin 3.5  AST 21  ALT 30  Total Protein 8.1  Bilirubin, Direct <0.1  Total Bilirubin 0.3    09/29/2011 17:00  Hemoglobin A1C 11.5 (H)    09/29/2011 17:05  Pro B Natriuretic peptide (BNP) 95.0   Basename 09/30/11 1132 09/30/11 0705 09/29/11 1705  CKTOTAL 231* 249* 212*  CKMB 9.4* 11.8* 5.2*  TROPONINI 0.82* 1.28* 0.32*   Component Value Date   CHOL 181 09/30/2011   HDL 44 09/30/2011   LDLCALC UNABLE TO CALCULATE IF TRIGLYCERIDE OVER 400 mg/dL 4/54/0981   TRIG 191* 4/78/2956    Component Value Date   DDIMER 0.36 09/29/2011     09/29/2011 17:05  TSH 10.015 (H)     Discharge Medications   Medication List  As of 10/01/2011  9:47 AM   STOP taking these medications         cloNIDine 0.1 MG tablet         TAKE these medications         aspirin 81 MG EC tablet   Take 1 tablet (81 mg total) by mouth daily.      atorvastatin 40 MG tablet   Commonly known as: LIPITOR   Take 1 tablet (40 mg total) by mouth at bedtime.      furosemide 40 MG tablet   Commonly known as: LASIX   Take 1 tablet by mouth Once daily as needed.      glipiZIDE 5 MG tablet   Commonly known as: GLUCOTROL   Take 1 tablet by mouth Twice daily.      insulin glargine 100 UNIT/ML injection   Commonly known as: LANTUS   Inject 20 Units into the skin at bedtime.      isosorbide mononitrate 30 MG 24 hr tablet   Commonly known as:  IMDUR   Take 1 tablet (30 mg total) by mouth daily.      lisinopril 10 MG tablet   Commonly known as: PRINIVIL,ZESTRIL   Take 1 tablet (10 mg total) by mouth daily.      multivitamin tablet   Take 1 tablet by mouth daily.      nitroGLYCERIN 0.4 MG SL tablet   Commonly known as: NITROSTAT   Place 1 tablet (0.4 mg total) under the tongue every 5 (five) minutes as needed for chest pain (up to 3 doses).      potassium chloride SA 20 MEQ tablet   Commonly known as: K-DUR,KLOR-CON   Take 1 tablet by mouth Daily.      SUMAtriptan 50 MG tablet   Commonly known as: IMITREX   Take 1 tablet (50 mg total) by mouth every 2 (two) hours as needed for migraine. Please take with caution as it can cause coronary vasospasm      SYNTHROID 175 MCG tablet   Generic drug: levothyroxine   Take 1 tablet by mouth Daily.      triamterene-hydrochlorothiazide 75-50 MG per tablet   Commonly known as: MAXZIDE   Take 0.5 tablets by mouth daily.            Disposition   Discharge Orders    Future Orders Please Complete By Expires   Diet - low sodium heart healthy       Increase activity slowly      Discharge instructions      Comments:   * Your heart catheterization did not show any blocked arteries around your heart to explain your chest pain. It is possible you have coronary artery spasms for which you were placed on Imdur. Please avoid use of Imitrex and discuss alternatives with your primary care provider as it can induce coronary artery spasm.  * KEEP WRIST CATHETERIZATION SITE CLEAN AND DRY. Call the office for any signs of bleeding, pus, swelling, increased pain, or any other concerns. * NO HEAVY LIFTING (>10lbs) OR SEXUAL ACTIVITY X 7 DAYS. * NO DRIVING X 2-3 DAYS. * NO SOAKING BATHS, HOT TUBS, POOLS, ETC., X 7 DAYS.  * Please follow up with your primary care provider as soon as possible for management of your diabetes (A1C 11.5), elevated triglyceride cholesterol, and hypothyroidism.   * You were started on Lantus insulin for which you will need to follow the instructions provided by diabetes management/nursing and follow up as soon as possible with your primary care provider.  * You were started on a cholesterol medication (Lipitor) this hospitalization and will need follow up blood work (lipid panel and liver function tests) in 6-8wks with your primary care provider.     Follow-up Information    Schedule an appointment as soon as possible for a visit with Ron Parker, MD.   Contact information:   [address] [place] [address] [phone]           Outstanding Labs/Studies:  1. Patient will require follow up lipid panel and liver function tests in 6-8 weeks as we initiated a new statin during this hospitalization.   Duration of Discharge Encounter: Greater than 30 minutes including physician and PA time.  Signed, HOPE, JESSICA PA-C 10/01/2011, 9:47 AM  Attending Note:   The patient was seen and examined.  Agree with assessment and plan as noted above.  See my note from earlier on the day  of discharge.  Alvia Grove., MD,  [facility] 10/02/2011, 5:48 AM

## 2011-10-01 NOTE — Progress Notes (Signed)
Pt's cbg is now 390.  Dr. Terressa Koyanagi notified and orders received for 15units novolog sq x 1 now.

## 2011-10-01 NOTE — Progress Notes (Signed)
Inpatient Diabetes Program Recommendations  AACE/ADA: New Consensus Statement on Inpatient Glycemic Control (2013)  Target Ranges:  Prepandial:   less than 140 mg/dL      Peak postprandial:   less than 180 mg/dL (1-2 hours)      Critically ill patients:  140 - 180 mg/dL   Reason for Visit: Hyperglycemia and consult ordered Spoke with patient at length as to course of time of disease.  Pt states her diet has been 'terrible' as she works at [employer] and eats burger and fries.  Pt determined to change her eating habits and is very motivated to stay alive. States she has appt with Dr Della Goo in the am who will determine her OP therapy.  Ordered OP education at [facility] as pt is interested in learning more.  Inpatient Diabetes Program Recommendations Insulin - Basal:  Pt needs basal lantus.  Please order 20 units lantus (at minimum starting dose of .2 units per kg, pt would need 30 units) HgbA1C: high at 11.5 %  Note: Thank you, Lenor Coffin, RN, CNS, Diabetes Coordinator [phone])

## 2011-10-03 ENCOUNTER — Emergency Department (HOSPITAL_COMMUNITY): Payer: 59

## 2011-10-03 ENCOUNTER — Encounter (HOSPITAL_COMMUNITY): Payer: Self-pay | Admitting: Adult Health

## 2011-10-03 ENCOUNTER — Emergency Department (HOSPITAL_COMMUNITY)
Admission: EM | Admit: 2011-10-03 | Discharge: 2011-10-04 | Disposition: A | Discharge status: Home / Self Care | Payer: 59 | Attending: Emergency Medicine | Admitting: Emergency Medicine

## 2011-10-03 DIAGNOSIS — R739 Hyperglycemia, unspecified: Secondary | ICD-10-CM

## 2011-10-03 DIAGNOSIS — R079 Chest pain, unspecified: Secondary | ICD-10-CM

## 2011-10-03 DIAGNOSIS — I10 Essential (primary) hypertension: Secondary | ICD-10-CM | POA: Insufficient documentation

## 2011-10-03 DIAGNOSIS — Z794 Long term (current) use of insulin: Secondary | ICD-10-CM | POA: Insufficient documentation

## 2011-10-03 DIAGNOSIS — E119 Type 2 diabetes mellitus without complications: Secondary | ICD-10-CM | POA: Insufficient documentation

## 2011-10-03 DIAGNOSIS — R0602 Shortness of breath: Secondary | ICD-10-CM | POA: Insufficient documentation

## 2011-10-03 DIAGNOSIS — R000 Tachycardia, unspecified: Secondary | ICD-10-CM

## 2011-10-03 LAB — URINALYSIS, ROUTINE W REFLEX MICROSCOPIC
Glucose, UA: >1000 mg/dL — AB
Hgb urine dipstick: NEGATIVE
Ketones, ur: 15 mg/dL — AB
Leukocytes, UA: NEGATIVE
Protein, ur: NEGATIVE mg/dL

## 2011-10-03 LAB — COMPREHENSIVE METABOLIC PANEL
ALT: 31 U/L (ref 0–35)
AST: 20 U/L (ref 0–37)
Calcium: 10.2 mg/dL (ref 8.4–10.5)
GFR calc Af Amer: 60 mL/min — ABNORMAL LOW (ref >90)
Glucose, Bld: 471 mg/dL — ABNORMAL HIGH (ref 70–99)
Sodium: 129 mEq/L — ABNORMAL LOW (ref 135–145)
Total Protein: 8.6 g/dL — ABNORMAL HIGH (ref 6.0–8.3)

## 2011-10-03 LAB — POCT I-STAT TROPONIN I: Troponin i, poc: 0.12 ng/mL (ref 0.00–0.08)

## 2011-10-03 LAB — URINE MICROSCOPIC-ADD ON

## 2011-10-03 LAB — GLUCOSE, CAPILLARY: Glucose-Capillary: 378 mg/dL — ABNORMAL HIGH (ref 70–99)

## 2011-10-03 LAB — CBC
MCH: 28.9 pg (ref 26.0–34.0)
MCHC: 33.5 g/dL (ref 30.0–36.0)
Platelets: 320 10*3/uL (ref 150–400)
RDW: 13.5 % (ref 11.5–15.5)

## 2011-10-03 LAB — BASIC METABOLIC PANEL
Calcium: 9.5 mg/dL (ref 8.4–10.5)
GFR calc Af Amer: 63 mL/min — ABNORMAL LOW (ref >90)
GFR calc non Af Amer: 54 mL/min — ABNORMAL LOW (ref >90)
Glucose, Bld: 377 mg/dL — ABNORMAL HIGH (ref 70–99)
Sodium: 131 mEq/L — ABNORMAL LOW (ref 135–145)

## 2011-10-03 LAB — POCT I-STAT, CHEM 8
BUN: 35 mg/dL — ABNORMAL HIGH (ref 6–23)
Chloride: 101 mEq/L (ref 96–112)
Sodium: 134 mEq/L — ABNORMAL LOW (ref 135–145)

## 2011-10-03 MED ORDER — INSULIN ASPART 100 UNIT/ML ~~LOC~~ SOLN
10.0000 [IU] | Freq: Once | SUBCUTANEOUS | Status: AC
  Administered 2011-10-03: 10 [IU] via SUBCUTANEOUS
  Filled 2011-10-03: qty 10

## 2011-10-03 MED ORDER — SODIUM CHLORIDE 0.9 % IV BOLUS (SEPSIS)
2000.0000 mL | Freq: Once | INTRAVENOUS | Status: AC
  Administered 2011-10-03: 2000 mL via INTRAVENOUS

## 2011-10-03 MED ORDER — SODIUM CHLORIDE 0.9 % IV SOLN: INTRAVENOUS | Status: DC

## 2011-10-03 MED ORDER — INSULIN ASPART 100 UNIT/ML ~~LOC~~ SOLN: 5.0000 [IU] | Freq: Once | SUBCUTANEOUS | Status: DC

## 2011-10-03 NOTE — ED Notes (Signed)
Troponin value given to Dr. Fonnie Jarvis by B. Bing Plume, EMT

## 2011-10-03 NOTE — ED Provider Notes (Signed)
I saw and evaluated the patient, reviewed the resident's note and I agree with the findings including ECG and plan.  Several days intermittent brief chest tightness lasts less than 5 minutes at a time occurs several times a day, hospitalized for same Dx DM and NSTEMI but clean cath, polyuria and polydipsia improved since admit but high glucose with mild elevated AG today.  Hurman Horn, MD 10/05/11 [phone]

## 2011-10-03 NOTE — ED Provider Notes (Signed)
History     CSN: [id number]  Arrival date & time 10/03/11  1747   First MD Initiated Contact with Patient 10/03/11 1917      Chief Complaint  Patient presents with  . Hyperglycemia    In addition to what is listed below.  Patient is a 51 year old female with past medical history significant for hypertension diabetes and recent admission after a non-STEMI who presents to the emergency department with complaints of elevated blood glucose and chest pain. Patient states she was discharged from the hospital on Tuesday and since then she has had multiple intermittent episodes of chest pain. He states the pain is located substernally nonradiating aching in quality and ranging from a 4 to a 10 out of 10 in severity. Additional symptoms include associated shortness of breath with chest pain weakness and some intermittent dizziness. Patient reports checking her blood glucose at home prior to arrival it was noted to be 350. She contacted her regular doctor and was told to take NovoLog 10 units which she did. After taking NovoLog she noted that her blood glucose increased into the 500s. She does report taking a prednisone for treatment of allergic reaction she experienced during hospital stay from IV dye.   (Consider location/radiation/quality/duration/timing/severity/associated sxs/prior treatment) Patient is a 51 y.o. female presenting with chest pain. The history is provided by the patient and medical records. No language interpreter was used.  Chest Pain The chest pain began 5 - 7 days ago. Chest pain occurs intermittently. The chest pain is unchanged. At its most intense, the pain is at 10/10. The pain is currently at 4/10. The severity of the pain is severe. The quality of the pain is described as aching. The pain does not radiate. Chest pain is worsened by deep breathing. Primary symptoms include shortness of breath and palpitations.  The shortness of breath began more than 2 days ago. The shortness  of breath is moderate.  The palpitations began more than 2 days ago. The onset of the palpitations was gradual. An episode of palpitations lasts for 5 to 10 minutes. The palpitations also occurred with shortness of breath.   Associated symptoms include weakness.  Pertinent negatives for associated symptoms include no claudication. She tried nothing for the symptoms.  Her past medical history is significant for diabetes, hyperlipidemia and hypertension.  Pertinent negatives for past medical history include no CAD.  Procedure history is positive for cardiac catheterization.     Past Medical History  Diagnosis Date  . Hypertension   . Diabetes mellitus, type 2   . Hypothyroidism   . Asthma     Questionable  . ALLERGIC RHINITIS   . Hypertriglyceridemia   . History of tobacco abuse     Quit 2011  . Obesity   . 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 NSTEMI (non-ST elevated myocardial infarction) 09/30/11    NO CAD by cath, ? coronary vasospasm    Past Surgical History  Procedure Date  . Vesicovaginal fistula closure w/ tah     Family History  Problem Relation Age of Onset  . Heart disease Mother     has pacemaker  . Diabetes      strong family history  . Cancer Maternal Uncle     History  Substance Use Topics  . Smoking status: Former Smoker -- 0.8 packs/day for 36 years    Types: Cigarettes    Quit date: 07/04/2010  . Smokeless tobacco: Not on file  . Alcohol Use: Yes  VERY RARE    OB History    Grav Para Term Preterm Abortions TAB SAB Ect Mult Living                  Review of Systems  Respiratory: Positive for shortness of breath.   Cardiovascular: Positive for chest pain and palpitations. Negative for claudication.  Neurological: Positive for weakness.  All other systems reviewed and are negative.    Allergies  Ivp dye; Codeine; and Other  Home Medications   Current Outpatient Rx  Name Route Sig Dispense Refill  . ASPIRIN 81 MG PO TBEC Oral Take 1 tablet (81  mg total) by mouth daily.    . ATORVASTATIN CALCIUM 40 MG PO TABS Oral Take 1 tablet (40 mg total) by mouth at bedtime. 30 tablet 2  . CETIRIZINE HCL 10 MG PO TABS Oral Take 10 mg by mouth daily.    Marland Kitchen FAMOTIDINE 20 MG PO TABS Oral Take 20 mg by mouth 2 (two) times daily.    . FUROSEMIDE 40 MG PO TABS Oral Take 1 tablet by mouth Once daily as needed.    Marland Kitchen GLIPIZIDE 5 MG PO TABS Oral Take 5 mg by mouth 2 (two) times daily before a meal.    . INSULIN GLARGINE 100 UNIT/ML  SOLN Subcutaneous Inject 20 Units into the skin at bedtime. 10 mL 1  . ISOSORBIDE MONONITRATE ER 30 MG PO TB24 Oral Take 1 tablet (30 mg total) by mouth daily. 30 tablet 6  . LISINOPRIL 10 MG PO TABS Oral Take 1 tablet (10 mg total) by mouth daily. 30 tablet 1  . ONE-DAILY MULTI VITAMINS PO TABS Oral Take 1 tablet by mouth daily.      Marland Kitchen NITROGLYCERIN 0.4 MG SL SUBL Sublingual Place 1 tablet (0.4 mg total) under the tongue every 5 (five) minutes as needed for chest pain (up to 3 doses). 25 tablet 3  . POTASSIUM CHLORIDE CRYS ER 20 MEQ PO TBCR Oral Take 1 tablet by mouth Daily.    Marland Kitchen PREDNISONE (PAK) 10 MG PO TABS Oral Take 10 mg by mouth daily. 6 day pack started 10-02-11 ending 10-08-11    . SUMATRIPTAN SUCCINATE 50 MG PO TABS Oral Take 1 tablet (50 mg total) by mouth every 2 (two) hours as needed for migraine. Please take with caution as it can cause coronary vasospasm 10 tablet 0  . SYNTHROID 175 MCG PO TABS Oral Take 1 tablet by mouth Daily.    . TRIAMTERENE-HCTZ 75-50 MG PO TABS Oral Take 0.5 tablets by mouth daily. 30 tablet 1    BP 124/77  Pulse 105  Temp 98.3 F (36.8 C) (Oral)  Resp 16  SpO2 97%  Physical Exam  Constitutional: She is oriented to person, place, and time. She appears well-developed and well-nourished. No distress.  HENT:  Head: Normocephalic and atraumatic.  Right Ear: External ear normal.  Mouth/Throat: Oropharynx is clear and moist.  Eyes: Conjunctivae and EOM are normal. Pupils are equal, round,  and reactive to light.  Neck: Normal range of motion. Neck supple.  Cardiovascular: Regular rhythm, S1 normal, S2 normal and intact distal pulses.  Tachycardia present.   Pulmonary/Chest: Effort normal and breath sounds normal. No respiratory distress. She has no wheezes. She has no rales. She exhibits no tenderness.  Abdominal: Soft. Bowel sounds are normal. She exhibits no distension and no mass. There is no tenderness. There is no rebound and no guarding.  Musculoskeletal: Normal range of motion. She exhibits no  edema and no tenderness.  Neurological: She is alert and oriented to person, place, and time. No cranial nerve deficit.  Skin: There is erythema (appx 6 cm area of erythme noted in anterior mid forearm of LUE.  ).    ED Course  Procedures (including critical care time)  Labs Reviewed  CBC - Abnormal; Notable for the following:    WBC 15.6 (*)     All other components within normal limits  COMPREHENSIVE METABOLIC PANEL - Abnormal; Notable for the following:    Sodium 129 (*)     Potassium 5.3 (*)     Chloride 93 (*)     Glucose, Bld 471 (*)     BUN 31 (*)     Creatinine, Ser 1.19 (*)     Total Protein 8.6 (*)     GFR calc non Af Amer 52 (*)     GFR calc Af Amer 60 (*)     All other components within normal limits  GLUCOSE, CAPILLARY - Abnormal; Notable for the following:    Glucose-Capillary 454 (*)     All other components within normal limits  POCT I-STAT TROPONIN I - Abnormal; Notable for the following:    Troponin i, poc 0.12 (*)     All other components within normal limits  URINALYSIS, ROUTINE W REFLEX MICROSCOPIC - Abnormal; Notable for the following:    APPearance CLOUDY (*)     Glucose, UA >1000 (*)     Ketones, ur 15 (*)     All other components within normal limits  POCT I-STAT TROPONIN I - Abnormal; Notable for the following:    Troponin i, poc 0.13 (*)     All other components within normal limits  POCT I-STAT, CHEM 8 - Abnormal; Notable for the  following:    Sodium 134 (*)     Potassium 5.4 (*)     BUN 35 (*)     Creatinine, Ser 1.50 (*)     Glucose, Bld 422 (*)     Hemoglobin 15.3 (*)     All other components within normal limits  URINE MICROSCOPIC-ADD ON - Abnormal; Notable for the following:    Squamous Epithelial / LPF MANY (*)     Bacteria, UA FEW (*)     All other components within normal limits  GLUCOSE, CAPILLARY - Abnormal; Notable for the following:    Glucose-Capillary 378 (*)     All other components within normal limits  BASIC METABOLIC PANEL - Abnormal; Notable for the following:    Sodium 131 (*)     Potassium 5.6 (*)     Glucose, Bld 377 (*)     BUN 29 (*)     Creatinine, Ser 1.15 (*)     GFR calc non Af Amer 54 (*)     GFR calc Af Amer 63 (*)     All other components within normal limits  KETONES, QUALITATIVE   Dg Chest 2 View  10/03/2011  *RADIOLOGY REPORT*  Clinical Data: Chest pain shortness of breath.  CHEST - 2 VIEW  Comparison: PA and lateral chest 09/29/2011 and CT chest 09/22/2010.  Findings: Lungs are clear. Heart size is normal.  No pneumothorax or effusion.  Mild elevation right hemidiaphragm is noted.  IMPRESSION: No acute disease.   Original Report Authenticated By: Bernadene Bell. Maricela Curet, M.D.     Date: 10/03/2011  Rate: 102  Rhythm: sinus tachycardia  QRS Axis: normal  Intervals: normal  ST/T Wave abnormalities: normal  Conduction Disutrbances:none  Narrative Interpretation:   Old EKG Reviewed: no changes other than rate    1. Hyperglycemia   2. Tachycardia   3. Chest pain   4. Shortness of breath       MDM  Patient is a 51 year old female with past medical history significant for hypertension diabetes hypercholesterolemia and a recent admission for non-STEMI but having a heart cath showing no coronary artery disease who presents to emergency department with complaints of continued episodes of chest pain and hyperglycemia. Upon arrival in the emergency department patient was  afebrile and vital signs remarkable only for HR in the low 100s.  On exam patient with erythematous skin lesion over the left upper extremity (attributed to allergic reaction from IV dye and currently being treated with prednisone).  otherwise as above unremarkable. Review of the patient's labs obtained in triage the patient has a gap of 16 A. elevated blood glucose at recent admission showed the vent slowly decreasing since. Review of the EKG showed sinus tachycardia but no evidence of ischemia.with normal vital she was discharged without event. signs unremarkable physical exam and with patient having no symptoms she is felt to be appropriate for discharge.        Johnney Ou, MD 10/04/11 [phone]

## 2011-10-03 NOTE — ED Notes (Addendum)
Pt reports hospitalized for chest pain from Sunday until Tuesday with Chest pain. Had cardiac cath that was clean and started on Imdur and SL nitro, also DX with new onset diabetes began on lantus and glipizide. The chest pain is not as bad as it has been today but c/o high blood sugar up to 300, called her doctor and was told to take novolog 10 units at 4 pm she rechecked her sugar at 5pm and she states her sugar went up from 300 to 500, she is currently on prednisone from an allergic reaction and redness to both arms.  C/o bilateral hand edema, blurred vision, headache and feeling weak as well as chest pain that feels like an "ache" that is intermittent. Pt has not taken her nitro for the pain but does take imdur everday since Tuesday.  CBG 454, neurologically intact.

## 2011-10-17 ENCOUNTER — Encounter (INDEPENDENT_AMBULATORY_CARE_PROVIDER_SITE_OTHER): Payer: 59

## 2011-10-17 DIAGNOSIS — R002 Palpitations: Secondary | ICD-10-CM

## 2011-10-30 ENCOUNTER — Ambulatory Visit: Payer: 59 | Admitting: *Deleted

## 2011-11-21 ENCOUNTER — Encounter: Payer: 59 | Attending: Internal Medicine | Admitting: *Deleted

## 2011-11-21 ENCOUNTER — Encounter: Payer: Self-pay | Admitting: *Deleted

## 2011-11-21 VITALS — Ht 69.0 in | Wt 334.1 lb

## 2011-11-21 DIAGNOSIS — Z713 Dietary counseling and surveillance: Secondary | ICD-10-CM | POA: Insufficient documentation

## 2011-11-21 DIAGNOSIS — E119 Type 2 diabetes mellitus without complications: Secondary | ICD-10-CM | POA: Insufficient documentation

## 2011-11-21 NOTE — Progress Notes (Signed)
  Medical Nutrition Therapy:  Appt start time: 0930 end time:  1030.   Assessment:  Primary concerns today: diabetes.   MEDICATIONS: see list   DIETARY INTAKE:  Usual eating pattern includes 3 meals and 2 snacks per day.  Everyday foods include fruits, starches, lean proteins, vegetables.  Avoided foods include fried foods, sugars.    24-hr recall:  B ( AM): applesauce and yogurt and glass skim milk; oatmeal; sometimes scrambles egg on whole grain bread Snk ( AM): none  L ( PM): baked potato but usually side salad with grilled chicken Snk ( PM): apple slices, cranberries or sometimes a few crackers D ( PM): spaghetti; baked meats, salads Snk ( PM): apples or another fruit or frozen yogurt sometimes has chips Beverages: water or reduced fat milk or diet drink  Usual physical activity: walks at work and walks some after work  Estimated energy needs: 1800 calories 200 g carbohydrates 135 g protein 50 g fat  Progress Towards Goal(s):  Some progress. Apryl has already mae some dietary changes since her diagnosis 6 weeks ago: No candy, no more fried foods, soda, tea, juices, etc before.  Didn't use to eat breakfast.  Has cut back portion   Nutritional Diagnosis:  NB-1.1 Food and nutrition-related knowledge deficit As related to proper balance of fats, carbohydrates, and proteins.  As evidenced by obesity, HTN, hypercholesterolemia, and new diagnosis of diabetes.    Intervention:  Nutrition counseling provided.  Avayah was referred for a variety of health concerns: obesity, HTN, hypercholesterolemia, and diabetes.  She asked to focus on her diabetes.  She was recently diagnosed and although she has an extensive family history of diabetes and she made already made some dietary changes, she would like more education.  Her HbA1C was 11/3 at diagnosis.  She reports daily glucose monitoring and an average fasting blood glucose around 130 mg/dL; average post meal around 160 mg/dL.  She has  lost 13 pounds since her diagnosis. She is discouraged, but I praised her for her progress and encouraged her to keep up the good work.  Discussed physiology of diabetes and role of obesity on insulin resistance.  Discussed her need for insulin compared to a type 1 diabetic's need for insulin.  Encouraged continued weight loss for improve insulin sensitivity.  Discussed what foods contain carbohydrates and how to count those carbs.  Discussed reading food labels and limiting fats.  Encouraged moderate physical activity   Handouts given during visit include: Living Well with Diabetes Carb Counting and Food Label handouts Meal Plan Card  Monitoring/Evaluation:  Dietary intake, exercise, BGM, and body weight in 1 month(s).  Discuss roles of fats and exercise on blood glucose.

## 2011-11-21 NOTE — Patient Instructions (Signed)
Goals:  Follow Diabetes Meal Plan as instructed  Eat 3 meals and 2 snacks, every 3-5 hrs  Limit carbohydrate intake to 45-60 grams carbohydrate/meal  Limit carbohydrate intake to 15-20 grams carbohydrate/snack  Add lean protein foods to meals/snacks  Monitor glucose levels as instructed by your doctor  Aim for 20 mins of physical activity daily  Bring food record and glucose log to your next nutrition visit

## 2011-12-25 ENCOUNTER — Ambulatory Visit: Payer: 59 | Admitting: *Deleted

## 2012-01-20 ENCOUNTER — Ambulatory Visit: Payer: 59 | Admitting: *Deleted

## 2012-02-05 HISTORY — PX: KNEE ARTHROSCOPY W/ MENISCAL REPAIR: SHX1877

## 2012-04-20 ENCOUNTER — Other Ambulatory Visit (HOSPITAL_COMMUNITY): Payer: Self-pay | Admitting: Cardiology

## 2012-06-15 ENCOUNTER — Other Ambulatory Visit (HOSPITAL_COMMUNITY): Payer: Self-pay | Admitting: Cardiology

## 2012-06-19 ENCOUNTER — Other Ambulatory Visit (HOSPITAL_COMMUNITY): Payer: Self-pay | Admitting: Cardiology

## 2012-06-23 ENCOUNTER — Other Ambulatory Visit (HOSPITAL_COMMUNITY): Payer: Self-pay | Admitting: Cardiology

## 2012-10-09 ENCOUNTER — Other Ambulatory Visit (HOSPITAL_COMMUNITY): Payer: Self-pay | Admitting: Internal Medicine

## 2012-10-09 DIAGNOSIS — Z1231 Encounter for screening mammogram for malignant neoplasm of breast: Secondary | ICD-10-CM

## 2012-10-13 ENCOUNTER — Ambulatory Visit (HOSPITAL_COMMUNITY)
Admission: RE | Admit: 2012-10-13 | Discharge: 2012-10-13 | Disposition: A | Discharge status: Home / Self Care | Payer: BC Managed Care – PPO | Source: Ambulatory Visit | Attending: Internal Medicine | Admitting: Internal Medicine

## 2012-10-13 DIAGNOSIS — Z1231 Encounter for screening mammogram for malignant neoplasm of breast: Secondary | ICD-10-CM | POA: Insufficient documentation

## 2012-11-20 ENCOUNTER — Emergency Department (INDEPENDENT_AMBULATORY_CARE_PROVIDER_SITE_OTHER): Payer: BC Managed Care – PPO

## 2012-11-20 ENCOUNTER — Encounter (HOSPITAL_COMMUNITY): Payer: Self-pay | Admitting: Emergency Medicine

## 2012-11-20 ENCOUNTER — Emergency Department (HOSPITAL_COMMUNITY)
Admission: EM | Admit: 2012-11-20 | Discharge: 2012-11-20 | Disposition: A | Discharge status: Home / Self Care | Payer: BC Managed Care – PPO | Source: Home / Self Care | Attending: Family Medicine | Admitting: Family Medicine

## 2012-11-20 DIAGNOSIS — M76822 Posterior tibial tendinitis, left leg: Secondary | ICD-10-CM

## 2012-11-20 DIAGNOSIS — M722 Plantar fascial fibromatosis: Secondary | ICD-10-CM

## 2012-11-20 DIAGNOSIS — H00019 Hordeolum externum unspecified eye, unspecified eyelid: Secondary | ICD-10-CM

## 2012-11-20 DIAGNOSIS — M76829 Posterior tibial tendinitis, unspecified leg: Secondary | ICD-10-CM

## 2012-11-20 DIAGNOSIS — H00016 Hordeolum externum left eye, unspecified eyelid: Secondary | ICD-10-CM

## 2012-11-20 MED ORDER — TRAMADOL HCL 50 MG PO TABS: 50.0000 mg | ORAL_TABLET | Freq: Four times a day (QID) | ORAL | Status: DC | PRN

## 2012-11-20 MED ORDER — POLYMYXIN B-TRIMETHOPRIM 10000-0.1 UNIT/ML-% OP SOLN: 1.0000 [drp] | OPHTHALMIC | Status: DC

## 2012-11-20 MED ORDER — PREDNISONE 10 MG PO KIT: PACK | ORAL | Status: DC

## 2012-11-20 NOTE — ED Provider Notes (Signed)
[name] is a 52 y.o. female who presents to Urgent Care today for left foot and ankle pain. Patient has had plantar foot pain for about 2 weeks. She's been limping and notes 2 days ago significant medial ankle pain. The pain is moderate to severe worse with activity and better with rest. She's tried over-the-counter pain medications which have not helped. She denies any injury.   Additionally she notes a stye in her left upper eyelid. She has tried warm compress which has not helped. No blurry vision fevers chills nausea vomiting or diarrhea.    Past Medical History  Diagnosis Date  . Hypertension   . Diabetes mellitus, type 2   . Hypothyroidism   . Asthma     Questionable  . ALLERGIC RHINITIS   . Hypertriglyceridemia   . History of tobacco abuse     Quit 2011  . Obesity   . 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 NSTEMI (non-ST elevated myocardial infarction) 09/30/11    NO CAD by cath, ? coronary vasospasm   History  Substance Use Topics  . Smoking status: Former Smoker -- 0.80 packs/day for 36 years    Types: Cigarettes    Quit date: 07/04/2010  . Smokeless tobacco: Not on file  . Alcohol Use: Yes     Comment: VERY RARE   ROS as above Medications reviewed. No current facility-administered medications for this encounter.   Current Outpatient Prescriptions  Medication Sig Dispense Refill  . atorvastatin (LIPITOR) 40 MG tablet Take 1 tablet (40 mg total) by mouth at bedtime.  30 tablet  2  . cetirizine (ZYRTEC) 10 MG tablet Take 10 mg by mouth daily.      . famotidine (PEPCID) 20 MG tablet Take 20 mg by mouth 2 (two) times daily.      . furosemide (LASIX) 40 MG tablet Take 1 tablet by mouth Once daily as needed.      Marland Kitchen glipiZIDE (GLUCOTROL) 5 MG tablet Take 5 mg by mouth 2 (two) times daily before a meal.      . insulin glargine (LANTUS) 100 UNIT/ML injection Inject 20 Units into the skin 2 (two) times daily.      . isosorbide mononitrate (IMDUR) 30 MG 24 hr tablet Take 1 tablet (30 mg  total) by mouth daily.  30 tablet  6  . lisinopril (PRINIVIL) 10 MG tablet Take 1 tablet (10 mg total) by mouth daily.  30 tablet  1  . Multiple Vitamin (MULTIVITAMIN) tablet Take 1 tablet by mouth daily.        . nitroGLYCERIN (NITROSTAT) 0.4 MG SL tablet Place 1 tablet (0.4 mg total) under the tongue every 5 (five) minutes as needed for chest pain (up to 3 doses).  25 tablet  3  . potassium chloride SA (K-DUR,KLOR-CON) 20 MEQ tablet Take 1 tablet by mouth Daily.      . PredniSONE 10 MG KIT 12 day dose pack po  1 kit  0  . SUMAtriptan (IMITREX) 50 MG tablet Take 1 tablet (50 mg total) by mouth every 2 (two) hours as needed for migraine. Please take with caution as it can cause coronary vasospasm  10 tablet  0  . SYNTHROID 175 MCG tablet Take 1 tablet by mouth Daily.      . traMADol (ULTRAM) 50 MG tablet Take 1 tablet (50 mg total) by mouth every 6 (six) hours as needed for pain.  20 tablet  0  . triamterene-hydrochlorothiazide (MAXZIDE) 75-50 MG per tablet  Take 0.5 tablets by mouth daily.  30 tablet  1  . trimethoprim-polymyxin b (POLYTRIM) ophthalmic solution Place 1 drop into the left eye every 4 (four) hours.  10 mL  0    Exam:  BP 156/74  Pulse 119  Temp(Src) 98.6 F (37 C) (Oral)  Resp 18  SpO2 94% Gen: Well NAD HEENT: EOMI,  MMM, soft mildly tender mass upper left eyelid. No conjunctival injection Lungs: CTABL Nl WOB Heart: RRR no MRG Abd: NABS, NT, ND Exts: Non edematous BL  LE, warm and well perfused.  Left ankle: Mild effusion present tender to palpation of the course of the posterior tibialis tendon medially. Mildly tender plantar calcaneus. Patient with ankle motion. Capillary refill and sensation is intact distally  No results found for this or any previous visit (from the past 24 hour(s)). Dg Ankle Complete Left  11/20/2012   CLINICAL DATA:  Initial encounter for current episode of left ankle pain and swelling. No known injuries.  EXAM: LEFT ANKLE COMPLETE - 3+ VIEW   COMPARISON:  None.  FINDINGS: No evidence of acute or subacute fracture or dislocation. Ankle mortise intact with well preserved joint space. Well preserved bone mineral density. Small plantar calcaneal spur arising from the calcaneus. Small enthesopathic spur at the insertion of the Achilles tendon on the posterior calcaneus. Small to moderate-sized ankle joint effusion.  IMPRESSION: No acute osseous abnormality. Small to moderate-sized ankle joint effusion. Small plantar calcaneal spur.   Electronically Signed   By: Hulan Saas M.D.   On: 11/20/2012 17:13    Assessment and Plan: 52 y.o. female with  1) plantar fasciitis with resulting posterior tibialis tendinitis due to limping. Plan for a Cam Walker boot. Additionally his tramadol for pain control. I have prescribed a prednisone Dosepak for use for pain if not improving with immobilization. I'm reluctant to use prednisone as patient has diabetes and will have increasing blood sugar.  She will followup with her orthopedist at [facility] orthopedics if not improving. 2) stye left eye; plan for warm compress and Polytrim eyedrops.  Discussed warning signs or symptoms. Please see discharge instructions. Patient expresses understanding.      Rodolph Bong, MD 11/20/12 [phone]

## 2012-11-20 NOTE — ED Notes (Signed)
Pain and swelling  in her left foot, no known injury. Most of pain in her plantar surface of foot (feels like i'm walking on rocks)  Also concerned about poss stye on her left eyelid

## 2012-12-09 ENCOUNTER — Other Ambulatory Visit (HOSPITAL_COMMUNITY): Payer: Self-pay | Admitting: Podiatry

## 2012-12-09 DIAGNOSIS — M79672 Pain in left foot: Secondary | ICD-10-CM

## 2012-12-15 ENCOUNTER — Ambulatory Visit (HOSPITAL_COMMUNITY): Payer: BC Managed Care – PPO

## 2012-12-15 ENCOUNTER — Encounter (HOSPITAL_COMMUNITY): Payer: BC Managed Care – PPO

## 2013-10-08 IMAGING — CR DG CHEST 2V
2 series · 2 of 2 positions shown · non-contrast
Comparison: 08/07/2010 chest radiograph and 06/22/2010 chest
radiograph and chest CT.

CLINICAL DATA: Chest pain and shortness of breath.

CHEST - 2 VIEW

[w chest pa]
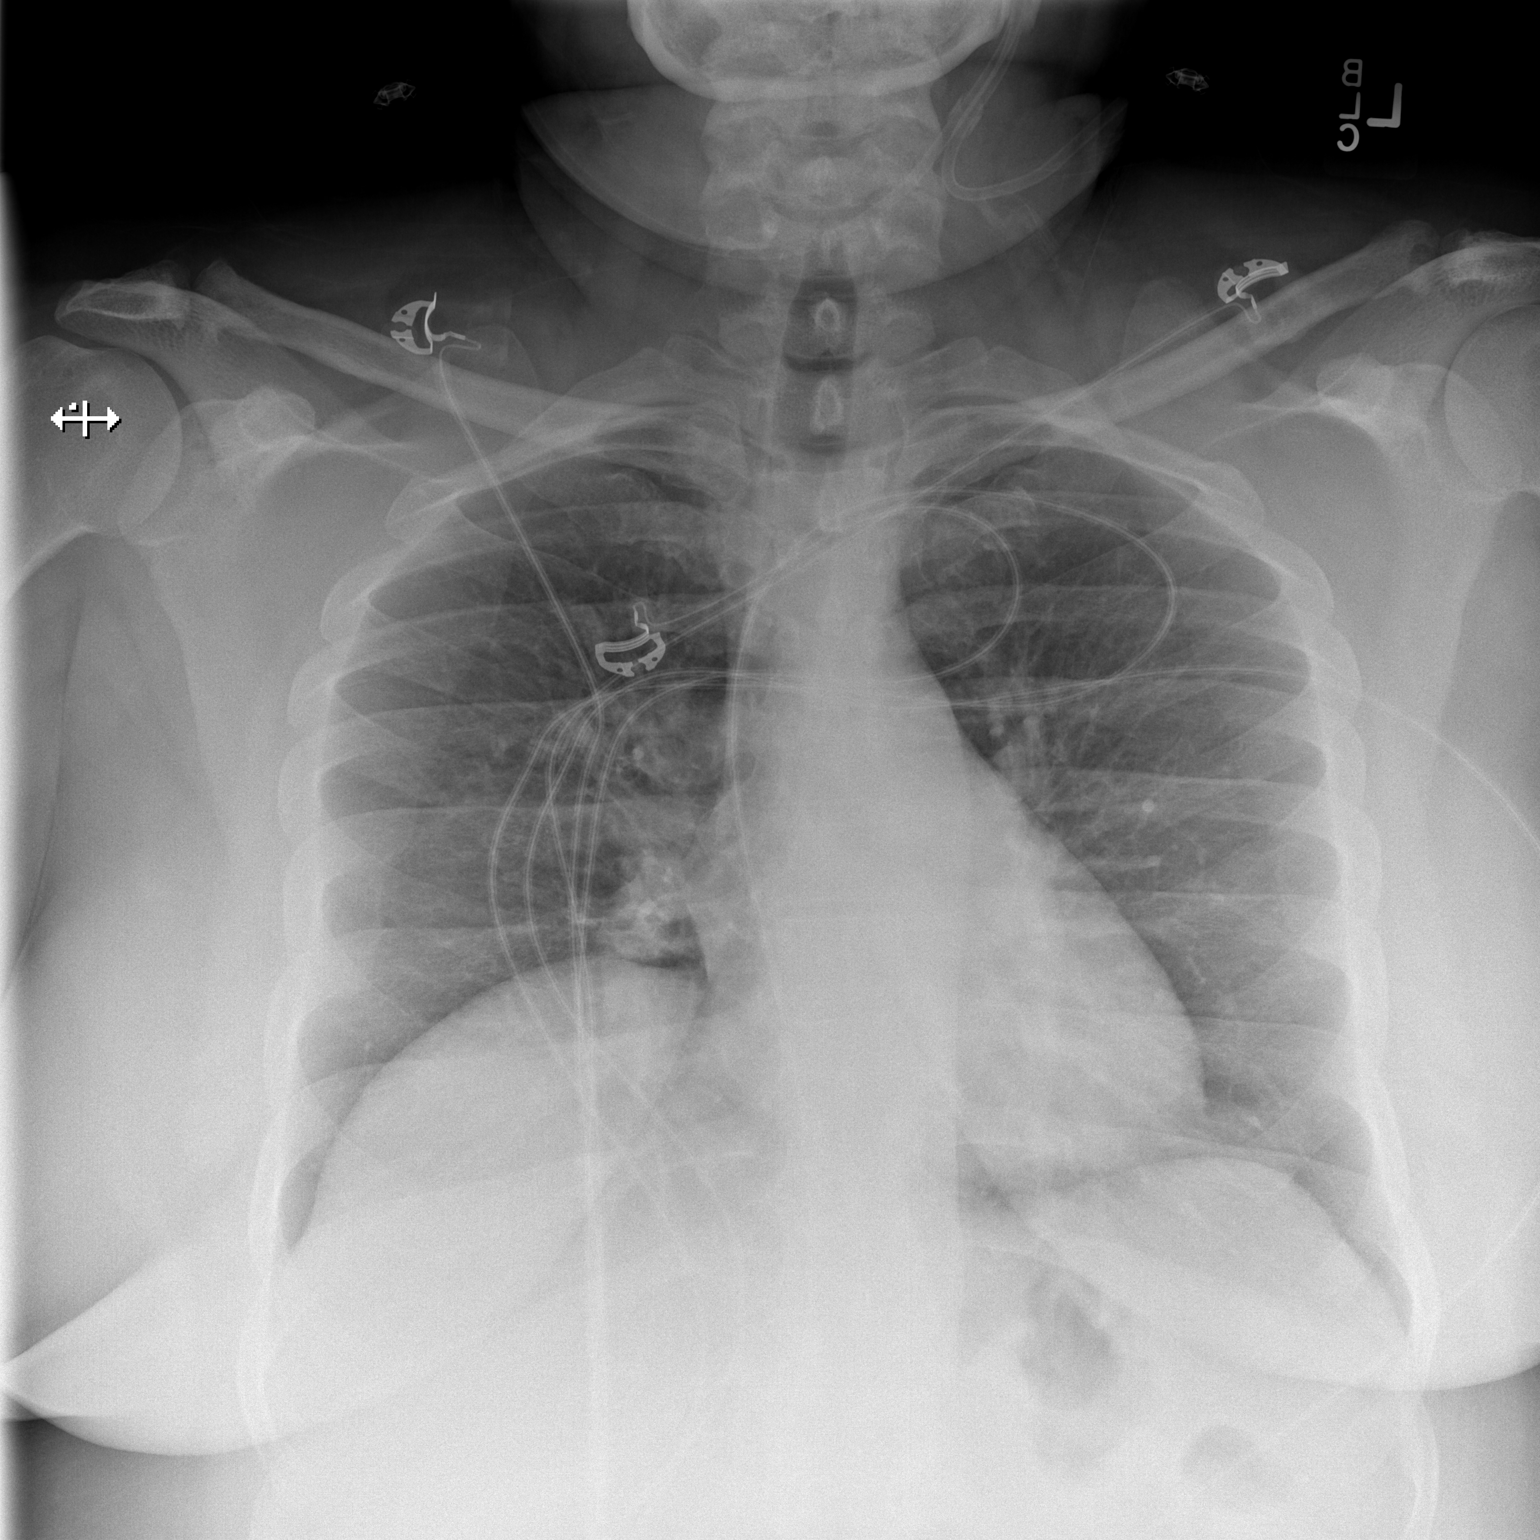

[w chest lat]
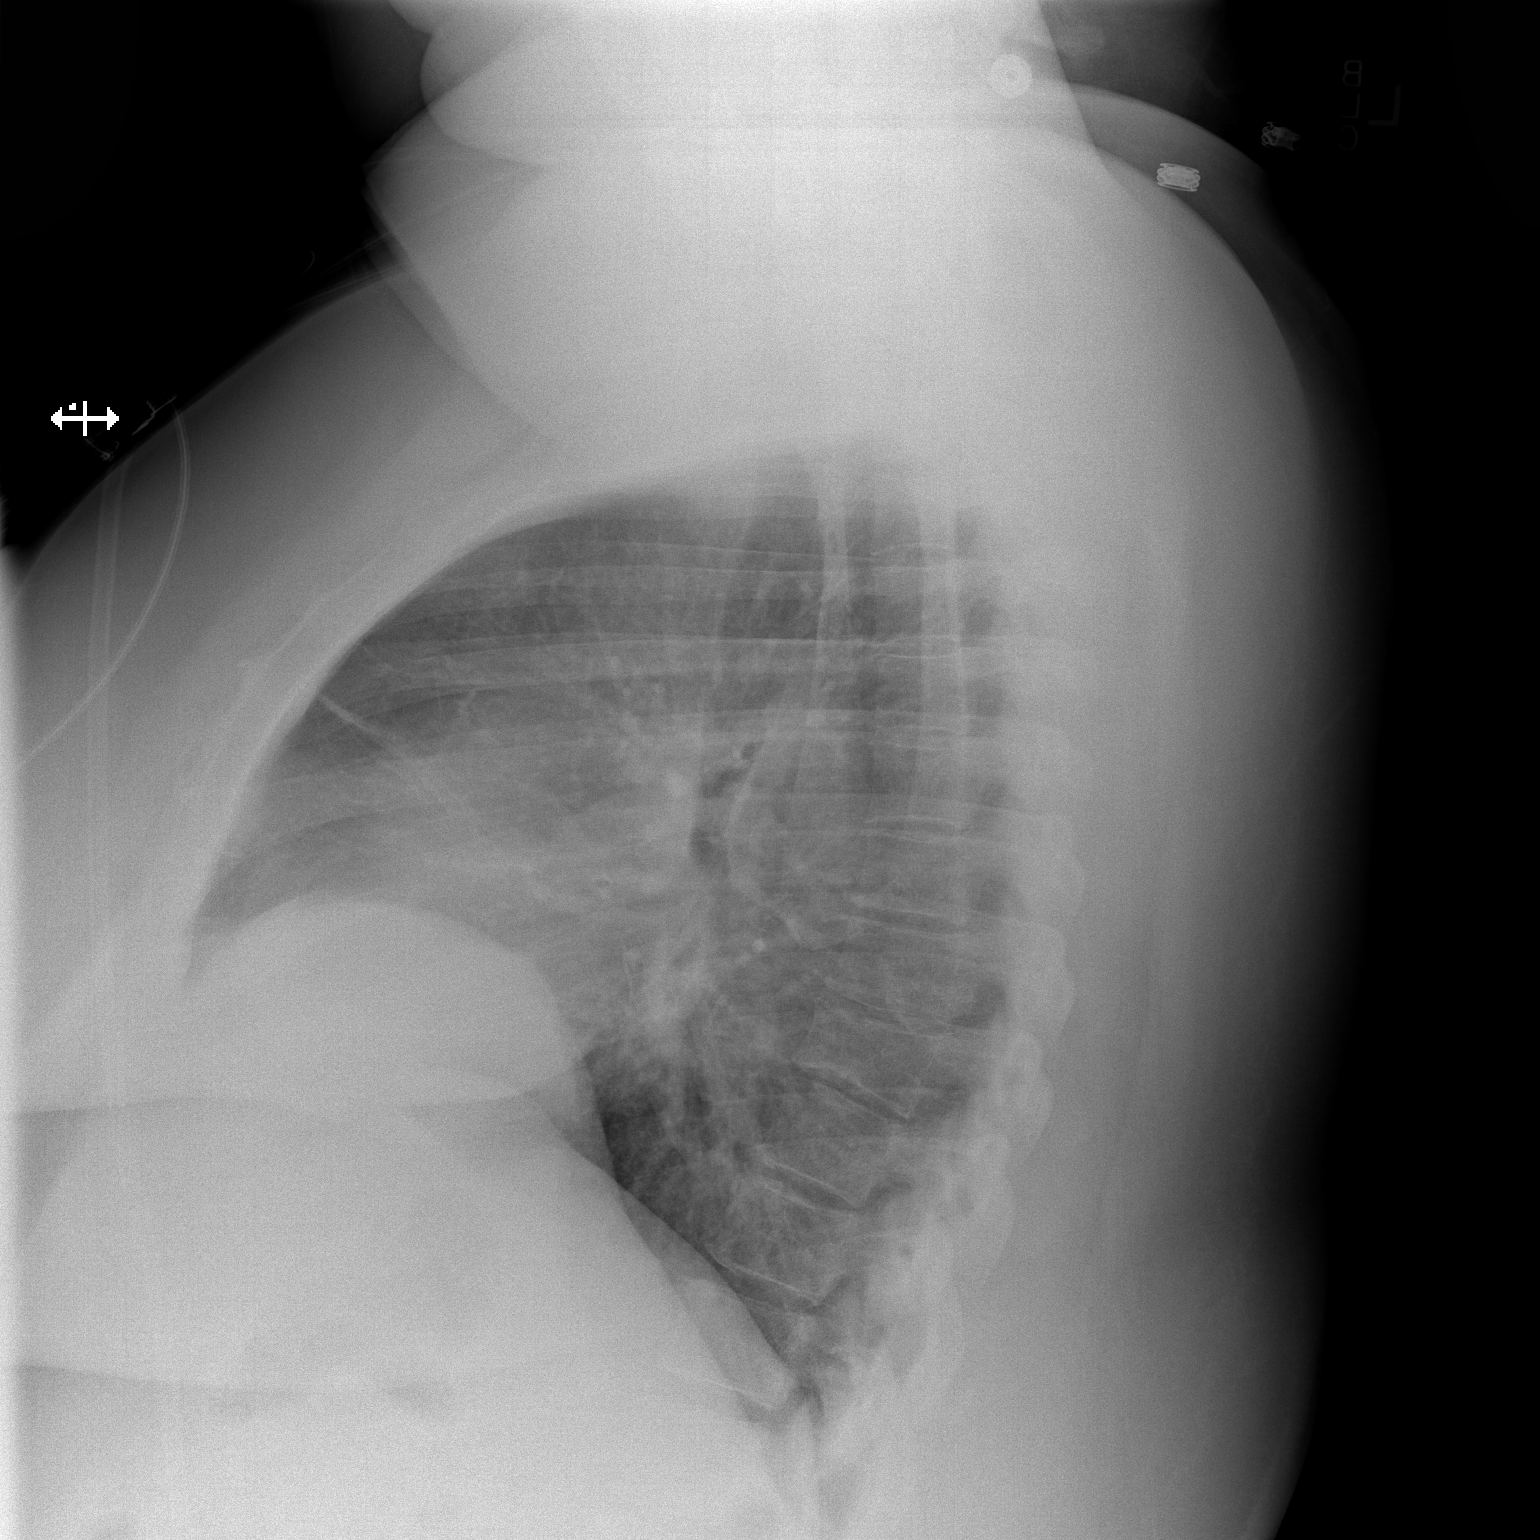

[2 of 2 positions shown; findings below may reference images not displayed]

FINDINGS: The cardiomediastinal silhouette is unremarkable.
Elevation of the right hemidiaphragm is again noted.
There is no evidence of focal airspace disease, pulmonary edema,
suspicious pulmonary nodule/mass, pleural effusion, or
pneumothorax.
No acute bony abnormalities are identified.
IMPRESSION: No evidence of acute cardiopulmonary disease.

## 2014-01-11 ENCOUNTER — Encounter (HOSPITAL_COMMUNITY): Payer: Self-pay | Admitting: Emergency Medicine

## 2014-01-11 ENCOUNTER — Emergency Department (HOSPITAL_COMMUNITY): Payer: BC Managed Care – PPO

## 2014-01-11 ENCOUNTER — Emergency Department (HOSPITAL_COMMUNITY)
Admission: EM | Admit: 2014-01-11 | Discharge: 2014-01-11 | Disposition: A | Discharge status: Home / Self Care | Payer: BC Managed Care – PPO | Attending: Emergency Medicine | Admitting: Emergency Medicine

## 2014-01-11 DIAGNOSIS — Z79899 Other long term (current) drug therapy: Secondary | ICD-10-CM | POA: Diagnosis not present

## 2014-01-11 DIAGNOSIS — Z794 Long term (current) use of insulin: Secondary | ICD-10-CM | POA: Diagnosis not present

## 2014-01-11 DIAGNOSIS — J45909 Unspecified asthma, uncomplicated: Secondary | ICD-10-CM | POA: Diagnosis not present

## 2014-01-11 DIAGNOSIS — Z87891 Personal history of nicotine dependence: Secondary | ICD-10-CM | POA: Insufficient documentation

## 2014-01-11 DIAGNOSIS — E669 Obesity, unspecified: Secondary | ICD-10-CM | POA: Insufficient documentation

## 2014-01-11 DIAGNOSIS — I10 Essential (primary) hypertension: Secondary | ICD-10-CM | POA: Insufficient documentation

## 2014-01-11 DIAGNOSIS — Z7982 Long term (current) use of aspirin: Secondary | ICD-10-CM | POA: Diagnosis not present

## 2014-01-11 DIAGNOSIS — R079 Chest pain, unspecified: Secondary | ICD-10-CM

## 2014-01-11 DIAGNOSIS — I252 Old myocardial infarction: Secondary | ICD-10-CM | POA: Diagnosis not present

## 2014-01-11 DIAGNOSIS — E119 Type 2 diabetes mellitus without complications: Secondary | ICD-10-CM | POA: Insufficient documentation

## 2014-01-11 DIAGNOSIS — E039 Hypothyroidism, unspecified: Secondary | ICD-10-CM | POA: Insufficient documentation

## 2014-01-11 DIAGNOSIS — R0789 Other chest pain: Secondary | ICD-10-CM | POA: Insufficient documentation

## 2014-01-11 LAB — CBC WITH DIFFERENTIAL/PLATELET
BASOS PCT: 0 % (ref 0–1)
Basophils Absolute: 0 10*3/uL (ref 0.0–0.1)
Eosinophils Absolute: 0.2 10*3/uL (ref 0.0–0.7)
Eosinophils Relative: 2 % (ref 0–5)
HEMATOCRIT: 40.1 % (ref 36.0–46.0)
HEMOGLOBIN: 13.2 g/dL (ref 12.0–15.0)
LYMPHS ABS: 3.9 10*3/uL (ref 0.7–4.0)
LYMPHS PCT: 38 % (ref 12–46)
MCH: 29.1 pg (ref 26.0–34.0)
MCHC: 32.9 g/dL (ref 30.0–36.0)
MCV: 88.3 fL (ref 78.0–100.0)
MONO ABS: 0.7 10*3/uL (ref 0.1–1.0)
MONOS PCT: 7 % (ref 3–12)
NEUTROS ABS: 5.6 10*3/uL (ref 1.7–7.7)
NEUTROS PCT: 53 % (ref 43–77)
Platelets: 301 10*3/uL (ref 150–400)
RBC: 4.54 MIL/uL (ref 3.87–5.11)
RDW: 14 % (ref 11.5–15.5)
WBC: 10.3 10*3/uL (ref 4.0–10.5)

## 2014-01-11 LAB — I-STAT CHEM 8, ED
BUN: 15 mg/dL (ref 6–23)
CHLORIDE: 108 meq/L (ref 96–112)
CREATININE: 0.7 mg/dL (ref 0.50–1.10)
Calcium, Ion: 1.08 mmol/L — ABNORMAL LOW (ref 1.12–1.23)
Glucose, Bld: 203 mg/dL — ABNORMAL HIGH (ref 70–99)
HCT: 43 % (ref 36.0–46.0)
Hemoglobin: 14.6 g/dL (ref 12.0–15.0)
Potassium: 4 mEq/L (ref 3.7–5.3)
SODIUM: 141 meq/L (ref 137–147)
TCO2: 23 mmol/L (ref 0–100)

## 2014-01-11 LAB — I-STAT TROPONIN, ED
TROPONIN I, POC: 0 ng/mL (ref 0.00–0.08)
Troponin i, poc: 0.01 ng/mL (ref 0.00–0.08)

## 2014-01-11 LAB — D-DIMER, QUANTITATIVE: D-Dimer, Quant: 0.56 ug/mL-FEU — ABNORMAL HIGH (ref 0.00–0.48)

## 2014-01-11 MED ORDER — TECHNETIUM TO 99M ALBUMIN AGGREGATED
6.0000 | Freq: Once | INTRAVENOUS | Status: AC | PRN
  Administered 2014-01-11: 6 via INTRAVENOUS

## 2014-01-11 MED ORDER — TECHNETIUM TC 99M DIETHYLENETRIAME-PENTAACETIC ACID: 40.0000 | Freq: Once | INTRAVENOUS | Status: DC | PRN

## 2014-01-11 MED ORDER — MORPHINE SULFATE 4 MG/ML IJ SOLN
4.0000 mg | Freq: Once | INTRAMUSCULAR | Status: AC
  Administered 2014-01-11: 4 mg via INTRAVENOUS
  Filled 2014-01-11: qty 1

## 2014-01-11 MED ORDER — ONDANSETRON HCL 4 MG/2ML IJ SOLN
4.0000 mg | Freq: Once | INTRAMUSCULAR | Status: AC
  Administered 2014-01-11: 4 mg via INTRAVENOUS
  Filled 2014-01-11: qty 2

## 2014-01-11 NOTE — ED Provider Notes (Signed)
CSN: [id number]     Arrival date & time 01/11/14  3845 History   First MD Initiated Contact with Patient 01/11/14 [phone]     Chief Complaint  Patient presents with  . Chest Pain     (Consider location/radiation/quality/duration/timing/severity/associated sxs/prior Treatment) HPI   [name] is a 53 y.o. female  who developed chest tightness yesterday evening about 5 PM.  She took a single nitroglycerin and went to sleep.  Today she had some chest pain while at rest.  This morning again a tight feeling.  She ultimately took 3 nitroglycerin today for intermittent chest pain.  On arrival to the emergency department.  Her chest pain is "almost gone".  EMS gave her aspirin.  Last use of nitroglycerin was about 2 years ago.  She has had evaluation by cardiology including stress test and Holter monitoring, and was told no further testing or specific treatment was needed.  She was instructed to follow-up with cardiologist, as needed.  She has diabetes and is following her blood sugar at home with good results.  She had a sensation of shortness of breath with the pain this morning.  There was no nausea, vomiting, weakness or dizziness.  She's been eating well, has not had any cough.  There are no other known modifying factors.  Past Medical History  Diagnosis Date  . Hypertension   . Diabetes mellitus, type 2   . Hypothyroidism   . Asthma     Questionable  . ALLERGIC RHINITIS   . Hypertriglyceridemia   . History of tobacco abuse     Quit 2011  . Obesity   . 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 NSTEMI (non-ST elevated myocardial infarction) 09/30/11    NO CAD by cath, ? coronary vasospasm   Past Surgical History  Procedure Laterality Date  . Vesicovaginal fistula closure w/ tah    . Tubal ligation     Family History  Problem Relation Age of Onset  . Heart disease Mother     has pacemaker  . Diabetes Mother   . Diabetes      strong family history  . Cancer Maternal Uncle    History  Substance  Use Topics  . Smoking status: Former Smoker -- 0.80 packs/day for 36 years    Types: Cigarettes    Quit date: 07/04/2010  . Smokeless tobacco: Not on file  . Alcohol Use: Yes     Comment: VERY RARE   OB History    No data available     Review of Systems  All other systems reviewed and are negative.     Allergies  Ivp dye; Codeine; and Other  Home Medications   Prior to Admission medications   Medication Sig Start Date End Date Taking? Authorizing Provider  aspirin EC 81 MG tablet Take 81 mg by mouth daily.   Yes Historical Provider, MD  cetirizine (ZYRTEC) 10 MG tablet Take 10 mg by mouth daily.   Yes Historical Provider, MD  famotidine (PEPCID) 20 MG tablet Take 20 mg by mouth 2 (two) times daily.   Yes Historical Provider, MD  furosemide (LASIX) 40 MG tablet Take 1 tablet by mouth Once daily as needed for fluid.  07/04/10  Yes Historical Provider, MD  glipiZIDE (GLUCOTROL) 5 MG tablet Take 5 mg by mouth 2 (two) times daily before a meal.   Yes Historical Provider, MD  hydrochlorothiazide (HYDRODIURIL) 25 MG tablet Take 25 mg by mouth daily.   Yes Historical Provider, MD  insulin aspart (NOVOLOG) 100 UNIT/ML injection Inject 16 Units into the skin 3 (three) times daily before meals. Use sliding scale   Yes Historical Provider, MD  isosorbide mononitrate (IMDUR) 30 MG 24 hr tablet Take 1 tablet (30 mg total) by mouth daily. 10/01/11 01/11/14 Yes Jessica A Hope, PA-C  levothyroxine (SYNTHROID, LEVOTHROID) 175 MCG tablet Take 175 mcg by mouth daily before breakfast.   Yes Historical Provider, MD  METFORMIN HCL PO Take 1 tablet by mouth daily.   Yes Historical Provider, MD  nitroGLYCERIN (NITROSTAT) 0.4 MG SL tablet Place 1 tablet (0.4 mg total) under the tongue every 5 (five) minutes as needed for chest pain (up to 3 doses). 10/01/11 01/11/14 Yes Jessica A Hope, PA-C  potassium chloride SA (K-DUR,KLOR-CON) 20 MEQ tablet Take 20 mEq by mouth as needed (muscle aches/cramps).  07/04/10  Yes  Historical Provider, MD  atorvastatin (LIPITOR) 40 MG tablet Take 1 tablet (40 mg total) by mouth at bedtime. 10/01/11 09/30/12  Jessica A Hope, PA-C  lisinopril (PRINIVIL) 10 MG tablet Take 1 tablet (10 mg total) by mouth daily. 10/01/11 09/30/12  Jessica A Hope, PA-C  PredniSONE 10 MG KIT 12 day dose pack po Patient not taking: Reported on 01/11/2014 11/20/12   Gregor Hams, MD  SUMAtriptan (IMITREX) 50 MG tablet Take 1 tablet (50 mg total) by mouth every 2 (two) hours as needed for migraine. Please take with caution as it can cause coronary vasospasm 10/01/11 09/30/12  Jessica A Hope, PA-C  traMADol (ULTRAM) 50 MG tablet Take 1 tablet (50 mg total) by mouth every 6 (six) hours as needed for pain. Patient not taking: Reported on 01/11/2014 11/20/12   Gregor Hams, MD  triamterene-hydrochlorothiazide (MAXZIDE) 75-50 MG per tablet Take 0.5 tablets by mouth daily. Patient not taking: Reported on 01/11/2014 10/01/11   [place], PA-C  trimethoprim-polymyxin b (POLYTRIM) ophthalmic solution Place 1 drop into the left eye every 4 (four) hours. Patient not taking: Reported on 01/11/2014 11/20/12   Gregor Hams, MD   BP 135/76 mmHg  Pulse 68  Temp(Src) 98.4 F (36.9 C) (Oral)  Resp 20  SpO2 100% Physical Exam  Constitutional: She is oriented to person, place, and time. She appears well-developed and well-nourished.  Obese  HENT:  Head: Normocephalic and atraumatic.  Right Ear: External ear normal.  Left Ear: External ear normal.  Eyes: Conjunctivae and EOM are normal. Pupils are equal, round, and reactive to light.  Neck: Normal range of motion and phonation normal. Neck supple.  Cardiovascular: Normal rate, regular rhythm and normal heart sounds.   Pulmonary/Chest: Effort normal and breath sounds normal. She exhibits no bony tenderness.  Abdominal: Soft. There is no tenderness.  Musculoskeletal: Normal range of motion.  Neurological: She is alert and oriented to person, place, and time. No  cranial nerve deficit or sensory deficit. She exhibits normal muscle tone. Coordination normal.  Skin: Skin is warm, dry and intact.  Psychiatric: She has a normal mood and affect. Her behavior is normal. Judgment and thought content normal.  Nursing note and vitals reviewed.   ED Course  Procedures (including critical care time) Medications  technetium TC 88M diethylenetriame-pentaacetic acid (DTPA) injection 40 milli [place]ie (not administered)  morphine 4 MG/ML injection 4 mg (4 mg Intravenous Given 01/11/14 1139)  ondansetron (ZOFRAN) injection 4 mg (4 mg Intravenous Given 01/11/14 1139)  technetium albumin aggregated (MAA) injection solution 6 milli Curie (6 milli [place]uries Intravenous Contrast Given 01/11/14 1515)    Patient  Vitals for the past 24 hrs:  BP Temp Temp src Pulse Resp SpO2  01/11/14 1715 135/76 mmHg - - 68 - 100 %  01/11/14 1700 137/83 mmHg - - 75 - 100 %  01/11/14 1630 119/87 mmHg - - 63 - 99 %  01/11/14 1610 122/72 mmHg 98.4 F (36.9 C) Oral 64 20 98 %  01/11/14 1600 122/72 mmHg - - (!) 50 - 98 %  01/11/14 1545 132/65 mmHg - - (!) 48 - 100 %  01/11/14 1400 (!) 118/52 mmHg - - (!) 57 - 99 %  01/11/14 1345 115/57 mmHg - - 69 - 94 %  01/11/14 1315 (!) 123/53 mmHg - - (!) 54 - 99 %  01/11/14 1300 134/60 mmHg - - (!) 54 - 100 %  01/11/14 1230 139/77 mmHg - - (!) 54 - 97 %  01/11/14 1209 135/73 mmHg 97.8 F (36.6 C) Oral (!) 57 20 99 %  01/11/14 1200 135/73 mmHg - - 70 - 99 %  01/11/14 1145 (!) 116/38 mmHg - - (!) 49 - 100 %  01/11/14 1130 (!) 120/43 mmHg - - (!) 39 - 100 %  01/11/14 1115 129/71 mmHg - - 60 - 100 %  01/11/14 0943 147/67 mmHg 98.4 F (36.9 C) Oral 62 17 100 %    4:33 PM Reevaluation with update and discussion. After initial assessment and treatment, an updated evaluation reveals No CP now, no further c/o. Fndings discussed with patient.Daleen Bo L   17:30- Dr. Einar Gip will see in office in next day or two.  Labs Review Labs Reviewed  D-DIMER,  QUANTITATIVE - Abnormal; Notable for the following:    D-Dimer, Quant 0.56 (*)    All other components within normal limits  I-STAT CHEM 8, ED - Abnormal; Notable for the following:    Glucose, Bld 203 (*)    Calcium, Ion 1.08 (*)    All other components within normal limits  CBC WITH DIFFERENTIAL  I-STAT TROPOININ, ED  I-STAT TROPOININ, ED    Imaging Review Nm Pulmonary Perf And Vent  01/11/2014   CLINICAL DATA:  Shortness of breath.  EXAM: NUCLEAR MEDICINE VENTILATION - PERFUSION LUNG SCAN  TECHNIQUE: Ventilation images were obtained in multiple projections using inhaled aerosol technetium 99 M DTPA. Perfusion images were obtained in multiple projections after intravenous injection of Tc-32mMAA.  RADIOPHARMACEUTICALS:  40 mCi Tc-923mTPA aerosol and 6 mCi Tc-9938mA  COMPARISON:  Chest x-ray 01/11/2014.  FINDINGS: Ventilation: No focal ventilation defect.  Perfusion: No wedge shaped peripheral perfusion defects to suggest acute pulmonary embolism.  IMPRESSION: Negative exam.  No evidence of pulmonary embolus.   Electronically Signed   By: ThoMarcello Mooresegister   On: 01/11/2014 15:38   Dg Chest Port 1 View  01/11/2014   CLINICAL DATA:  Chest pain and shortness of breath.  EXAM: PORTABLE CHEST - 1 VIEW  COMPARISON:  10/03/2011  FINDINGS: The heart size and mediastinal contours are within normal limits. Both lungs are clear. The visualized skeletal structures are unremarkable. Chronic slight elevation of the medial aspect of the right hemidiaphragm.  IMPRESSION: No acute abnormality.   Electronically Signed   By: JimRozetta NunneryD.   On: 01/11/2014 10:03     EKG Interpretation   Date/Time:  Tuesday January 11 2014 09:39:48 EST Ventricular Rate:  58 PR Interval:  174 QRS Duration: 84 QT Interval:  472 QTC Calculation: 464 R Axis:   62 Text Interpretation:  Sinus rhythm since last tracing no  significant  change Confirmed by [facility]  MD, Veron Senner [phone]) on 01/11/2014 10:26:53 AM      MDM    Final diagnoses:  Chest pain   Nonspecific chest pain, evaluation complex, requiring rule out PE.  VQ scan is negative.  Doubt ACS, PE, pneumonia, metabolic instability or impending vascular collapse.  Patient had a stress test a couple of years ago.  It is unlikely that she has developed significant coronary artery disease.  She is therefore stable for discharge with outpatient follow-up.   Nursing Notes Reviewed/ Care Coordinated Applicable Imaging Reviewed Interpretation of Laboratory Data incorporated into ED treatment  The patient appears reasonably screened and/or stabilized for discharge and I doubt any other medical condition or other [facility] requiring further screening, evaluation, or treatment in the ED at this time prior to discharge.  Plan: Home Medications- usual; Home Treatments- rest; return here if the recommended treatment, does not improve the symptoms; Recommended follow up- Cardiology 1-2 days   Richarda Blade, MD 01/11/14 1735

## 2014-01-11 NOTE — ED Notes (Signed)
Per EMS, pt began having CP last night and took 1 nitro and the CP subsided. This morning the pt began having CP this morning and took 1 nitro then the pain subsided. Pt went to work and then began having CP and took 2 nitros which brought the pain down to a 3 out of 10. Pt now reports the pain is a 1 out of 10. NAD at this time. Pt alert x 4.

## 2014-01-11 NOTE — Discharge Instructions (Signed)

## 2014-01-11 NOTE — ED Notes (Signed)
pts visitor came out and stated that pt has nausea and chest pain dr. Eulis Foster informed pts visioer then came out and complained that writer needs to see her mother as Probation officer was in process of doing Probation officer explained to both her and her mother that doctor was informed of pts nausea and pain and viisitor continues to argue that pt should be looked at then have doctor informed writer explained that doctor was informed in order to get order for medicine and that pts is being assessed ptscvisitor continues to argue and was dismissive with nurse visitor then came out and complained of nurse to er tech

## 2014-01-13 ENCOUNTER — Encounter (HOSPITAL_COMMUNITY): Payer: Self-pay | Admitting: Cardiovascular Disease

## 2014-08-31 ENCOUNTER — Other Ambulatory Visit (HOSPITAL_COMMUNITY): Payer: Self-pay | Admitting: Internal Medicine

## 2014-08-31 DIAGNOSIS — Z1231 Encounter for screening mammogram for malignant neoplasm of breast: Secondary | ICD-10-CM

## 2014-11-03 ENCOUNTER — Encounter (HOSPITAL_COMMUNITY): Payer: Self-pay

## 2014-11-03 ENCOUNTER — Emergency Department (HOSPITAL_COMMUNITY)
Admission: EM | Admit: 2014-11-03 | Discharge: 2014-11-03 | Disposition: A | Discharge status: Home / Self Care | Payer: BLUE CROSS/BLUE SHIELD | Attending: Emergency Medicine | Admitting: Emergency Medicine

## 2014-11-03 DIAGNOSIS — E119 Type 2 diabetes mellitus without complications: Secondary | ICD-10-CM | POA: Insufficient documentation

## 2014-11-03 DIAGNOSIS — Z87891 Personal history of nicotine dependence: Secondary | ICD-10-CM | POA: Diagnosis not present

## 2014-11-03 DIAGNOSIS — M5440 Lumbago with sciatica, unspecified side: Secondary | ICD-10-CM | POA: Insufficient documentation

## 2014-11-03 DIAGNOSIS — M5441 Lumbago with sciatica, right side: Secondary | ICD-10-CM

## 2014-11-03 DIAGNOSIS — E669 Obesity, unspecified: Secondary | ICD-10-CM | POA: Diagnosis not present

## 2014-11-03 DIAGNOSIS — I10 Essential (primary) hypertension: Secondary | ICD-10-CM | POA: Diagnosis not present

## 2014-11-03 DIAGNOSIS — E781 Pure hyperglyceridemia: Secondary | ICD-10-CM | POA: Diagnosis not present

## 2014-11-03 DIAGNOSIS — E039 Hypothyroidism, unspecified: Secondary | ICD-10-CM | POA: Diagnosis not present

## 2014-11-03 DIAGNOSIS — I252 Old myocardial infarction: Secondary | ICD-10-CM | POA: Insufficient documentation

## 2014-11-03 DIAGNOSIS — M5442 Lumbago with sciatica, left side: Secondary | ICD-10-CM

## 2014-11-03 DIAGNOSIS — Z79899 Other long term (current) drug therapy: Secondary | ICD-10-CM | POA: Diagnosis not present

## 2014-11-03 DIAGNOSIS — Z9889 Other specified postprocedural states: Secondary | ICD-10-CM | POA: Insufficient documentation

## 2014-11-03 DIAGNOSIS — J45909 Unspecified asthma, uncomplicated: Secondary | ICD-10-CM | POA: Insufficient documentation

## 2014-11-03 DIAGNOSIS — Z794 Long term (current) use of insulin: Secondary | ICD-10-CM | POA: Diagnosis not present

## 2014-11-03 DIAGNOSIS — M545 Low back pain: Secondary | ICD-10-CM | POA: Diagnosis present

## 2014-11-03 DIAGNOSIS — Z7982 Long term (current) use of aspirin: Secondary | ICD-10-CM | POA: Diagnosis not present

## 2014-11-03 MED ORDER — NAPROXEN 500 MG PO TABS: 500.0000 mg | ORAL_TABLET | Freq: Two times a day (BID) | ORAL | Status: DC

## 2014-11-03 MED ORDER — TRAMADOL HCL 50 MG PO TABS: 50.0000 mg | ORAL_TABLET | Freq: Four times a day (QID) | ORAL | Status: DC | PRN

## 2014-11-03 MED ORDER — METHOCARBAMOL 500 MG PO TABS: 500.0000 mg | ORAL_TABLET | Freq: Two times a day (BID) | ORAL | Status: DC

## 2014-11-03 NOTE — ED Notes (Signed)
Patient states she has had low back pain that radiates into bilateral buttockx 2 weeks, but today is worse. Patient states she feels like both legs are heavy and difficult to pick up legs/feet when walking.

## 2014-11-03 NOTE — Discharge Instructions (Signed)
Take Naproxen for the pain.  Take Robaxin and Ultram as needed for severe pain.  Do not drive or operate heavy machinery for 4-6 hours after taking pain medication and muscle relaxer.

## 2014-11-03 NOTE — ED Provider Notes (Signed)
CSN: [id number]     Arrival date & time 11/03/14  1779 History   First MD Initiated Contact with Patient 11/03/14 [phone]     Chief Complaint  Patient presents with  . Back Pain     (Consider location/radiation/quality/duration/timing/severity/associated sxs/prior Treatment) HPI Comments: Patient presents today with a chief complaint of lower back pain.  She states that the pain radiates to her buttocks and down her posterior legs.  Sometimes the pain radiates down the right leg and other times radiates down the left leg.  Pain has been present for the past 2 weeks, but has worsened over the past 3 days.  No acute injury or trauma.  She states that she does stand for 10 hours a day at work and also does lifting at work, which makes the pain worse.  Pain is also worse when she lifts her legs and also movement of the spine. She has tried applying hot and cold patches to the area without relief.  She reports numbness and tingling of both feet, which she reports that she has at baseline due to diabetic neuropathy.  No change in numbness or tingling.  Denies saddle anaesthesia.  Denies fever, chills, chest pain, abdominal pain, bowel/bladder incontinence, urinary symptoms, or weakness.    The history is provided by the patient.    Past Medical History  Diagnosis Date  . Hypertension   . Diabetes mellitus, type 2   . Hypothyroidism   . Asthma     Questionable  . ALLERGIC RHINITIS   . Hypertriglyceridemia   . History of tobacco abuse     Quit 2011  . Obesity   . 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 NSTEMI (non-ST elevated myocardial infarction) 09/30/11    NO CAD by cath, ? coronary vasospasm   Past Surgical History  Procedure Laterality Date  . Vesicovaginal fistula closure w/ tah    . Tubal ligation    . Left heart catheterization with coronary angiogram N/A 09/30/2011    Procedure: LEFT HEART CATHETERIZATION WITH CORONARY ANGIOGRAM;  Surgeon: Burnell Blanks, MD;  Location: [facility] CATH LAB;  Service:  Cardiovascular;  Laterality: N/A;   Family History  Problem Relation Age of Onset  . Heart disease Mother     has pacemaker  . Diabetes Mother   . Diabetes      strong family history  . Cancer Maternal Uncle    Social History  Substance Use Topics  . Smoking status: Former Smoker -- 0.80 packs/day for 36 years    Types: Cigarettes    Quit date: 07/04/2010  . Smokeless tobacco: Never Used  . Alcohol Use: Yes     Comment: VERY RARE   OB History    No data available     Review of Systems  All other systems reviewed and are negative.     Allergies  Ivp dye; Codeine; and Other  Home Medications   Prior to Admission medications   Medication Sig Start Date End Date Taking? Authorizing Provider  aspirin EC 81 MG tablet Take 81 mg by mouth daily.    Historical Provider, MD  atorvastatin (LIPITOR) 40 MG tablet Take 1 tablet (40 mg total) by mouth at bedtime. 10/01/11 09/30/12  Jessica A Hope, PA-C  cetirizine (ZYRTEC) 10 MG tablet Take 10 mg by mouth daily.    Historical Provider, MD  famotidine (PEPCID) 20 MG tablet Take 20 mg by mouth 2 (two) times daily.    Historical Provider, MD  furosemide (LASIX)  40 MG tablet Take 1 tablet by mouth Once daily as needed for fluid.  07/04/10   Historical Provider, MD  glipiZIDE (GLUCOTROL) 5 MG tablet Take 5 mg by mouth 2 (two) times daily before a meal.    Historical Provider, MD  hydrochlorothiazide (HYDRODIURIL) 25 MG tablet Take 25 mg by mouth daily.    Historical Provider, MD  insulin aspart (NOVOLOG) 100 UNIT/ML injection Inject 16 Units into the skin 3 (three) times daily before meals. Use sliding scale    Historical Provider, MD  isosorbide mononitrate (IMDUR) 30 MG 24 hr tablet Take 1 tablet (30 mg total) by mouth daily. 10/01/11 01/11/14  Jessica A Hope, PA-C  levothyroxine (SYNTHROID, LEVOTHROID) 175 MCG tablet Take 175 mcg by mouth daily before breakfast.    Historical Provider, MD  lisinopril (PRINIVIL) 10 MG tablet Take 1 tablet (10  mg total) by mouth daily. 10/01/11 09/30/12  Jessica A Hope, PA-C  METFORMIN HCL PO Take 1 tablet by mouth daily.    Historical Provider, MD  nitroGLYCERIN (NITROSTAT) 0.4 MG SL tablet Place 1 tablet (0.4 mg total) under the tongue every 5 (five) minutes as needed for chest pain (up to 3 doses). 10/01/11 01/11/14  Jessica A Hope, PA-C  potassium chloride SA (K-DUR,KLOR-CON) 20 MEQ tablet Take 20 mEq by mouth as needed (muscle aches/cramps).  07/04/10   Historical Provider, MD  PredniSONE 10 MG KIT 12 day dose pack po Patient not taking: Reported on 01/11/2014 11/20/12   Gregor Hams, MD  SUMAtriptan (IMITREX) 50 MG tablet Take 1 tablet (50 mg total) by mouth every 2 (two) hours as needed for migraine. Please take with caution as it can cause coronary vasospasm 10/01/11 09/30/12  Jessica A Hope, PA-C  traMADol (ULTRAM) 50 MG tablet Take 1 tablet (50 mg total) by mouth every 6 (six) hours as needed for pain. Patient not taking: Reported on 01/11/2014 11/20/12   Gregor Hams, MD  triamterene-hydrochlorothiazide (MAXZIDE) 75-50 MG per tablet Take 0.5 tablets by mouth daily. Patient not taking: Reported on 01/11/2014 10/01/11   [place], PA-C  trimethoprim-polymyxin b (POLYTRIM) ophthalmic solution Place 1 drop into the left eye every 4 (four) hours. Patient not taking: Reported on 01/11/2014 11/20/12   Gregor Hams, MD   BP 150/61 mmHg  Pulse 71  Temp(Src) 97.8 F (36.6 C) (Oral)  Resp 20  SpO2 93% Physical Exam  Constitutional: She appears well-developed and well-nourished.  HENT:  Head: Normocephalic and atraumatic.  Mouth/Throat: Oropharynx is clear and moist.  Neck: Normal range of motion. Neck supple.  Cardiovascular: Normal rate, regular rhythm and normal heart sounds.   Pulses:      Dorsalis pedis pulses are 2+ on the right side, and 2+ on the left side.  Pulmonary/Chest: Effort normal and breath sounds normal.  Abdominal: Soft. There is no tenderness.  Musculoskeletal:       Cervical  back: She exhibits normal range of motion, no tenderness, no bony tenderness, no swelling, no edema and no deformity.       Thoracic back: She exhibits normal range of motion, no tenderness, no bony tenderness, no swelling, no edema and no deformity.       Lumbar back: She exhibits normal range of motion, no tenderness, no bony tenderness, no swelling, no edema and no deformity.  Neurological: She is alert. She has normal strength. No sensory deficit. Gait normal.  Reflex Scores:      Patellar reflexes are 2+ on the right side  and 2+ on the left side. Skin: Skin is warm and dry.  Psychiatric: She has a normal mood and affect.  Nursing note and vitals reviewed.   ED Course  Procedures (including critical care time) Labs Review Labs Reviewed - No data to display  Imaging Review No results found. I have personally reviewed and evaluated these images and lab results as part of my medical decision-making.   EKG Interpretation None      MDM   Final diagnoses:  None   Patient with lower back pain.  Pain worse with movement.  No neurological deficits and normal neuro exam.  Patient can walk but states is painful.  No loss of bowel or bladder control.  No concern for cauda equina.  No fever, night sweats, weight loss, h/o cancer, IVDU.  Patient stable for discharge.  Return precautions given.     Hyman Bible, PA-C 11/03/14 [place], MD 11/03/14 [phone]

## 2015-02-27 ENCOUNTER — Emergency Department (HOSPITAL_COMMUNITY)
Admission: EM | Admit: 2015-02-27 | Discharge: 2015-02-27 | Disposition: A | Discharge status: Home / Self Care | Payer: BLUE CROSS/BLUE SHIELD | Source: Home / Self Care | Attending: Emergency Medicine | Admitting: Emergency Medicine

## 2015-02-27 ENCOUNTER — Encounter (HOSPITAL_COMMUNITY): Payer: Self-pay | Admitting: Emergency Medicine

## 2015-02-27 DIAGNOSIS — M25511 Pain in right shoulder: Secondary | ICD-10-CM | POA: Diagnosis not present

## 2015-02-27 DIAGNOSIS — M719 Bursopathy, unspecified: Secondary | ICD-10-CM

## 2015-02-27 MED ORDER — TRAMADOL HCL 50 MG PO TABS: 50.0000 mg | ORAL_TABLET | Freq: Four times a day (QID) | ORAL | Status: DC | PRN

## 2015-02-27 MED ORDER — DICLOFENAC SODIUM 75 MG PO TBEC: 75.0000 mg | DELAYED_RELEASE_TABLET | Freq: Two times a day (BID) | ORAL | Status: DC

## 2015-02-27 NOTE — ED Provider Notes (Signed)
HPI  SUBJECTIVE:  [name] is a 55 y.o. female who presents with constant, dull, achy, right shoulder pain with occasional radiation down to her arm into her neck for the past month. States becomes sharp and stabbing with abduction and certain motions. She has been taking ibuprofen 800 mg 3 times a day,  Aleve 500 mg every 12 hours, tried heat, cold, BenGay, Biofreeze. Symptoms are worse with lifting/abduction, better with sitting still. She denies any trauma, but states that she does a lot of overhead lifting/repetitive activity. She states it is waking her up at night. She reports occasional "bump" with certain movements. No nausea, vomiting, fevers, numbness, tingling, erythema, swelling, grip weakness. No neck pain radiating to her shoulder. Past medical history of diabetes, hypertension. No history of chronic kidney disease, osteopenia, history arthritis. Patient is a patient of [facility].   Past Medical History  Diagnosis Date  . Hypertension   . Diabetes mellitus, type 2 ([place])   . Hypothyroidism   . Asthma     Questionable  . ALLERGIC RHINITIS   . Hypertriglyceridemia   . History of tobacco abuse     Quit 2011  . Obesity   . 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 NSTEMI (non-ST elevated myocardial infarction) ([place]) 09/30/11    NO CAD by cath, ? coronary vasospasm    Past Surgical History  Procedure Laterality Date  . Vesicovaginal fistula closure w/ tah    . Tubal ligation    . Left heart catheterization with coronary angiogram N/A 09/30/2011    Procedure: LEFT HEART CATHETERIZATION WITH CORONARY ANGIOGRAM;  Surgeon: Burnell Blanks, MD;  Location: [facility] CATH LAB;  Service: Cardiovascular;  Laterality: N/A;    Family History  Problem Relation Age of Onset  . Heart disease Mother     has pacemaker  . Diabetes Mother   . Diabetes      strong family history  . Cancer Maternal Uncle     Social History  Substance Use Topics  . Smoking status: Former Smoker -- 0.80  packs/day for 36 years    Types: Cigarettes    Quit date: 07/04/2010  . Smokeless tobacco: Never Used  . Alcohol Use: Yes     Comment: VERY RARE    No current facility-administered medications for this encounter.  Current outpatient prescriptions:  .  aspirin EC 81 MG tablet, Take 81 mg by mouth daily., Disp: , Rfl:  .  cetirizine (ZYRTEC) 10 MG tablet, Take 10 mg by mouth daily., Disp: , Rfl:  .  famotidine (PEPCID) 20 MG tablet, Take 20 mg by mouth 2 (two) times daily., Disp: , Rfl:  .  furosemide (LASIX) 40 MG tablet, Take 1 tablet by mouth Once daily as needed for fluid. , Disp: , Rfl:  .  glipiZIDE (GLUCOTROL) 5 MG tablet, Take 5 mg by mouth 2 (two) times daily before a meal., Disp: , Rfl:  .  hydrochlorothiazide (HYDRODIURIL) 25 MG tablet, Take 25 mg by mouth daily., Disp: , Rfl:  .  insulin aspart (NOVOLOG) 100 UNIT/ML injection, Inject 16 Units into the skin 3 (three) times daily before meals. Use sliding scale, Disp: , Rfl:  .  levothyroxine (SYNTHROID, LEVOTHROID) 175 MCG tablet, Take 175 mcg by mouth daily before breakfast., Disp: , Rfl:  .  METFORMIN HCL PO, Take 1 tablet by mouth daily., Disp: , Rfl:  .  methocarbamol (ROBAXIN) 500 MG tablet, Take 1 tablet (500 mg total) by mouth 2 (two) times daily., Disp:  20 tablet, Rfl: 0 .  potassium chloride SA (K-DUR,KLOR-CON) 20 MEQ tablet, Take 20 mEq by mouth as needed (muscle aches/cramps). , Disp: , Rfl:  .  triamterene-hydrochlorothiazide (MAXZIDE) 75-50 MG per tablet, Take 0.5 tablets by mouth daily., Disp: 30 tablet, Rfl: 1 .  atorvastatin (LIPITOR) 40 MG tablet, Take 1 tablet (40 mg total) by mouth at bedtime., Disp: 30 tablet, Rfl: 2 .  diclofenac (VOLTAREN) 75 MG EC tablet, Take 1 tablet (75 mg total) by mouth 2 (two) times daily. Take with food, Disp: 30 tablet, Rfl: 0 .  isosorbide mononitrate (IMDUR) 30 MG 24 hr tablet, Take 1 tablet (30 mg total) by mouth daily., Disp: 30 tablet, Rfl: 6 .  lisinopril (PRINIVIL) 10 MG  tablet, Take 1 tablet (10 mg total) by mouth daily., Disp: 30 tablet, Rfl: 1 .  nitroGLYCERIN (NITROSTAT) 0.4 MG SL tablet, Place 1 tablet (0.4 mg total) under the tongue every 5 (five) minutes as needed for chest pain (up to 3 doses)., Disp: 25 tablet, Rfl: 3 .  SUMAtriptan (IMITREX) 50 MG tablet, Take 1 tablet (50 mg total) by mouth every 2 (two) hours as needed for migraine. Please take with caution as it can cause coronary vasospasm, Disp: 10 tablet, Rfl: 0 .  traMADol (ULTRAM) 50 MG tablet, Take 1 tablet (50 mg total) by mouth every 6 (six) hours as needed., Disp: 20 tablet, Rfl: 0  Allergies  Allergen Reactions  . Ivp Dye [Iodinated Diagnostic Agents] Swelling    "skin came off"  . Codeine Itching  . Other     NO PLASTIC TAPE     ROS  As noted in HPI.   Physical Exam  BP 156/66 mmHg  Pulse 78  Temp(Src) 98.1 F (36.7 C) (Oral)  Resp 18  SpO2 100%  Constitutional: Well developed, well nourished, no acute distress Eyes:  EOMI, conjunctiva normal bilaterally HENT: Normocephalic, atraumatic,mucus membranes moist Respiratory: Normal inspiratory effort Cardiovascular: Normal rate GI: nondistended skin: No rash, skin intact Musculoskeletal: Rshoulder with ROM somewhat limited , Drop test painful but negative, patient unable to abduction past 90 due to pain. Tenderness along trapezius, lateral shoulder, anterior shoulder . Clavicle NT, A/C joint NT, scapula NT, proximal humerus NT, shoulder joint tender, Motor strength normal decreased at shoulder due to pain, Sensation intact LT over deltoid region, distal NVI with hand on affected side having intact sensation and strength in the distribution of the median, radial, and ulnar nerve. negative tenderness in bicipital groove, positive empty can test, positive liftoff test, no instability with abduction/external rotation.  Neurologic: Alert & oriented x 3, no focal neuro deficits Psychiatric: Speech and behavior appropriate   ED  Course   Medications - No data to display  No orders of the defined types were placed in this encounter.    No results found for this or any previous visit (from the past 24 hour(s)). No results found.  ED Clinical Impression  Shoulder joint pain, right  Bursitis  ED Assessment/Plan Deferred x-rays. Doubt fracture, dislocation. Presentation most consistent with a bursitis. Do not feel that she has a rotator cuff tear as she has equal strength, however she may have some impingement of the rotator cuff. Patient is a patient of [facility]. She will follow-up with them in 2 weeks. Home with diclofenac, tramadol. DC ibuprofen, Aleve, ice, rest shoulder.  Discussed MDM, plan and followup with patient . Discussed sn/sx that should prompt return to the ED. Patient  agrees with plan.   *This  clinic note was created using [employer]. Therefore, there may be occasional mistakes despite careful proofreading.  ?   Melynda Ripple, MD 02/28/15 1112

## 2015-02-27 NOTE — ED Notes (Signed)
C/o right arm and shoulder pain for a month now States she thought it was arthritis pcp wants her to see chiropodics  Aleve, ibuprofen, ice and heat used as tx

## 2015-05-03 ENCOUNTER — Other Ambulatory Visit (HOSPITAL_COMMUNITY): Payer: Self-pay | Admitting: Internal Medicine

## 2015-05-03 DIAGNOSIS — Z1231 Encounter for screening mammogram for malignant neoplasm of breast: Secondary | ICD-10-CM

## 2015-05-05 ENCOUNTER — Other Ambulatory Visit: Payer: Self-pay | Admitting: Orthopedic Surgery

## 2015-05-05 DIAGNOSIS — M25511 Pain in right shoulder: Secondary | ICD-10-CM

## 2015-05-07 ENCOUNTER — Ambulatory Visit
Admission: RE | Admit: 2015-05-07 | Discharge: 2015-05-07 | Disposition: A | Discharge status: Home / Self Care | Payer: BLUE CROSS/BLUE SHIELD | Source: Ambulatory Visit | Attending: Orthopedic Surgery | Admitting: Orthopedic Surgery

## 2015-05-07 DIAGNOSIS — M25511 Pain in right shoulder: Secondary | ICD-10-CM

## 2015-09-25 ENCOUNTER — Ambulatory Visit
Admission: RE | Admit: 2015-09-25 | Discharge: 2015-09-25 | Disposition: A | Discharge status: Home / Self Care | Payer: BLUE CROSS/BLUE SHIELD | Source: Ambulatory Visit | Attending: Internal Medicine | Admitting: Internal Medicine

## 2015-09-25 DIAGNOSIS — Z1231 Encounter for screening mammogram for malignant neoplasm of breast: Secondary | ICD-10-CM

## 2015-10-10 ENCOUNTER — Ambulatory Visit
Admission: RE | Admit: 2015-10-10 | Discharge: 2015-10-10 | Disposition: A | Discharge status: Home / Self Care | Payer: BLUE CROSS/BLUE SHIELD | Source: Ambulatory Visit | Attending: Internal Medicine | Admitting: Internal Medicine

## 2015-10-10 ENCOUNTER — Other Ambulatory Visit: Payer: Self-pay | Admitting: Internal Medicine

## 2015-10-10 DIAGNOSIS — R609 Edema, unspecified: Secondary | ICD-10-CM

## 2015-10-31 ENCOUNTER — Ambulatory Visit (HOSPITAL_COMMUNITY)
Admission: EM | Admit: 2015-10-31 | Discharge: 2015-10-31 | Disposition: A | Discharge status: Home / Self Care | Payer: BLUE CROSS/BLUE SHIELD | Attending: Family Medicine | Admitting: Family Medicine

## 2015-10-31 ENCOUNTER — Encounter (HOSPITAL_COMMUNITY): Payer: Self-pay | Admitting: *Deleted

## 2015-10-31 DIAGNOSIS — Z87891 Personal history of nicotine dependence: Secondary | ICD-10-CM | POA: Diagnosis not present

## 2015-10-31 DIAGNOSIS — E119 Type 2 diabetes mellitus without complications: Secondary | ICD-10-CM | POA: Insufficient documentation

## 2015-10-31 DIAGNOSIS — J45909 Unspecified asthma, uncomplicated: Secondary | ICD-10-CM | POA: Diagnosis not present

## 2015-10-31 DIAGNOSIS — J029 Acute pharyngitis, unspecified: Secondary | ICD-10-CM

## 2015-10-31 DIAGNOSIS — J159 Unspecified bacterial pneumonia: Secondary | ICD-10-CM | POA: Diagnosis not present

## 2015-10-31 DIAGNOSIS — Z7982 Long term (current) use of aspirin: Secondary | ICD-10-CM | POA: Diagnosis not present

## 2015-10-31 DIAGNOSIS — R911 Solitary pulmonary nodule: Secondary | ICD-10-CM | POA: Insufficient documentation

## 2015-10-31 DIAGNOSIS — I252 Old myocardial infarction: Secondary | ICD-10-CM | POA: Insufficient documentation

## 2015-10-31 DIAGNOSIS — E781 Pure hyperglyceridemia: Secondary | ICD-10-CM | POA: Insufficient documentation

## 2015-10-31 DIAGNOSIS — Z79899 Other long term (current) drug therapy: Secondary | ICD-10-CM | POA: Diagnosis not present

## 2015-10-31 DIAGNOSIS — Z794 Long term (current) use of insulin: Secondary | ICD-10-CM | POA: Diagnosis not present

## 2015-10-31 DIAGNOSIS — E039 Hypothyroidism, unspecified: Secondary | ICD-10-CM | POA: Diagnosis not present

## 2015-10-31 DIAGNOSIS — Z91041 Radiographic dye allergy status: Secondary | ICD-10-CM | POA: Diagnosis not present

## 2015-10-31 DIAGNOSIS — I10 Essential (primary) hypertension: Secondary | ICD-10-CM | POA: Diagnosis not present

## 2015-10-31 DIAGNOSIS — Z9889 Other specified postprocedural states: Secondary | ICD-10-CM | POA: Diagnosis not present

## 2015-10-31 LAB — POCT RAPID STREP A: STREPTOCOCCUS, GROUP A SCREEN (DIRECT): NEGATIVE

## 2015-10-31 MED ORDER — AZITHROMYCIN 250 MG PO TABS: ORAL_TABLET | ORAL | 0 refills | Status: DC

## 2015-10-31 MED ORDER — IPRATROPIUM BROMIDE 0.06 % NA SOLN: 2.0000 | Freq: Four times a day (QID) | NASAL | 1 refills | Status: DC

## 2015-10-31 NOTE — ED Provider Notes (Signed)
[place]    CSN: [id number] Arrival date & time: 10/31/15  1151     History   Chief Complaint Chief Complaint  Patient presents with  . Sore Throat    HPI [name] is a 56 y.o. female.   The history is provided by the patient.  Sore Throat  This is a new problem. The current episode started 12 to 24 hours ago. The problem has been gradually worsening. Pertinent negatives include no chest pain, no abdominal pain, no headaches and no shortness of breath. The symptoms are aggravated by swallowing. Nothing relieves the symptoms.    Past Medical History:  Diagnosis Date  . ALLERGIC RHINITIS   . 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 Asthma    Questionable  . Diabetes mellitus, type 2 ([place])   . History of tobacco abuse    Quit 2011  . Hypertension   . Hypertriglyceridemia   . Hypothyroidism   . NSTEMI (non-ST elevated myocardial infarction) ([place]) 09/30/11   NO CAD by cath, ? coronary vasospasm  . Obesity     Patient Active Problem List   Diagnosis Date Noted  . Chest pain 09/29/2011  . Morbid obesity ([place]) 09/29/2011  . DM type 2 (diabetes mellitus, type 2) ([place]) 09/29/2011  . HTN (hypertension) 09/29/2011  . Pneumonia, bacterial 08/07/2010  . Incidental lung nodule, > 37mm and < 6mm 08/07/2010  . Bronchitis 08/07/2010    Past Surgical History:  Procedure Laterality Date  . LEFT HEART CATHETERIZATION WITH CORONARY ANGIOGRAM N/A 09/30/2011   Procedure: LEFT HEART CATHETERIZATION WITH CORONARY ANGIOGRAM;  Surgeon: Burnell Blanks, MD;  Location: [facility] CATH LAB;  Service: Cardiovascular;  Laterality: N/A;  . TUBAL LIGATION    . VESICOVAGINAL FISTULA CLOSURE W/ TAH      OB History    No data available       Home Medications    Prior to Admission medications   Medication Sig Start Date End Date Taking? Authorizing Provider  aspirin EC 81 MG tablet Take 81 mg by mouth daily.    Historical Provider, MD  atorvastatin (LIPITOR) 40 MG tablet Take 1 tablet  (40 mg total) by mouth at bedtime. 10/01/11 09/30/12  Jessica A Hope, PA-C  cetirizine (ZYRTEC) 10 MG tablet Take 10 mg by mouth daily.    Historical Provider, MD  diclofenac (VOLTAREN) 75 MG EC tablet Take 1 tablet (75 mg total) by mouth 2 (two) times daily. Take with food 02/27/15   Melynda Ripple, MD  famotidine (PEPCID) 20 MG tablet Take 20 mg by mouth 2 (two) times daily.    Historical Provider, MD  furosemide (LASIX) 40 MG tablet Take 1 tablet by mouth Once daily as needed for fluid.  07/04/10   Historical Provider, MD  glipiZIDE (GLUCOTROL) 5 MG tablet Take 5 mg by mouth 2 (two) times daily before a meal.    Historical Provider, MD  hydrochlorothiazide (HYDRODIURIL) 25 MG tablet Take 25 mg by mouth daily.    Historical Provider, MD  insulin aspart (NOVOLOG) 100 UNIT/ML injection Inject 16 Units into the skin 3 (three) times daily before meals. Use sliding scale    Historical Provider, MD  isosorbide mononitrate (IMDUR) 30 MG 24 hr tablet Take 1 tablet (30 mg total) by mouth daily. 10/01/11 01/11/14  [place], PA-C  levothyroxine (SYNTHROID, LEVOTHROID) 175 MCG tablet Take 175 mcg by mouth daily before breakfast.    Historical Provider, MD  lisinopril (PRINIVIL) 10 MG tablet Take 1 tablet (  10 mg total) by mouth daily. 10/01/11 09/30/12  Jessica A Hope, PA-C  METFORMIN HCL PO Take 1 tablet by mouth daily.    Historical Provider, MD  methocarbamol (ROBAXIN) 500 MG tablet Take 1 tablet (500 mg total) by mouth 2 (two) times daily. 11/03/14   Heather Laisure, PA-C  nitroGLYCERIN (NITROSTAT) 0.4 MG SL tablet Place 1 tablet (0.4 mg total) under the tongue every 5 (five) minutes as needed for chest pain (up to 3 doses). 10/01/11 01/11/14  Jessica A Hope, PA-C  potassium chloride SA (K-DUR,KLOR-CON) 20 MEQ tablet Take 20 mEq by mouth as needed (muscle aches/cramps).  07/04/10   Historical Provider, MD  SUMAtriptan (IMITREX) 50 MG tablet Take 1 tablet (50 mg total) by mouth every 2 (two) hours as needed for  migraine. Please take with caution as it can cause coronary vasospasm 10/01/11 09/30/12  Jessica A Hope, PA-C  traMADol (ULTRAM) 50 MG tablet Take 1 tablet (50 mg total) by mouth every 6 (six) hours as needed. 02/27/15   Melynda Ripple, MD  triamterene-hydrochlorothiazide (MAXZIDE) 75-50 MG per tablet Take 0.5 tablets by mouth daily. 10/01/11   [place], PA-C    Family History Family History  Problem Relation Age of Onset  . Heart disease Mother     has pacemaker  . Diabetes Mother   . Diabetes      strong family history  . Cancer Maternal Uncle     Social History Social History  Substance Use Topics  . Smoking status: Former Smoker    Packs/day: 0.80    Years: 36.00    Types: Cigarettes    Quit date: 07/04/2010  . Smokeless tobacco: Never Used  . Alcohol use Yes     Comment: VERY RARE     Allergies   Ivp dye [iodinated diagnostic agents]; Codeine; and Other   Review of Systems Review of Systems  Constitutional: Negative.  Negative for fever.  HENT: Positive for congestion, postnasal drip, rhinorrhea and sore throat.   Respiratory: Negative.  Negative for shortness of breath.   Cardiovascular: Negative.  Negative for chest pain.  Gastrointestinal: Negative for abdominal pain.  Neurological: Negative for headaches.  All other systems reviewed and are negative.    Physical Exam Triage Vital Signs ED Triage Vitals  Enc Vitals Group     BP 10/31/15 1251 114/59     Pulse Rate 10/31/15 1251 93     Resp 10/31/15 1251 16     Temp 10/31/15 1251 98.8 F (37.1 C)     Temp Source 10/31/15 1251 Oral     SpO2 10/31/15 1251 100 %     Weight --      Height --      Head Circumference --      Peak Flow --      Pain Score 10/31/15 1309 4     Pain Loc --      Pain Edu? --      Excl. in [place]? --    No data found.   Updated Vital Signs BP 114/59 (BP Location: Left Arm)   Pulse 93   Temp 98.8 F (37.1 C) (Oral)   Resp 16   SpO2 100%   Visual Acuity Right Eye  Distance:   Left Eye Distance:   Bilateral Distance:    Right Eye Near:   Left Eye Near:    Bilateral Near:     Physical Exam  Constitutional: She is oriented to person, place, and time. She appears  well-developed and well-nourished. No distress.  HENT:  Head: Normocephalic.  Right Ear: External ear normal.  Left Ear: External ear normal.  Mouth/Throat: Mucous membranes are normal. Posterior oropharyngeal edema and posterior oropharyngeal erythema present. No oropharyngeal exudate. No tonsillar exudate.  Neck: Normal range of motion. Neck supple.  Cardiovascular: Normal rate, regular rhythm, normal heart sounds and intact distal pulses.   Pulmonary/Chest: Effort normal and breath sounds normal.  Lymphadenopathy:    She has no cervical adenopathy.  Neurological: She is alert and oriented to person, place, and time.  Skin: Skin is warm and dry.  Nursing note and vitals reviewed.    UC Treatments / Results  Labs (all labs ordered are listed, but only abnormal results are displayed) Labs Reviewed - No data to display  EKG  EKG Interpretation None       Radiology No results found.  Procedures Procedures (including critical care time)  Medications Ordered in UC Medications - No data to display   Initial Impression / Assessment and Plan / UC Course  I have reviewed the triage vital signs and the nursing notes.  Pertinent labs & imaging results that were available during my care of the patient were reviewed by me and considered in my medical decision making (see chart for details).  Clinical Course      Final Clinical Impressions(s) / UC Diagnoses   Final diagnoses:  None    New Prescriptions New Prescriptions   No medications on file     Billy Fischer, MD 10/31/15 1328

## 2015-10-31 NOTE — ED Triage Notes (Signed)
Pt  Reports  Symptoms  Of symptoms  Of  sorethroat     As   Well     As  Chills      And  Headache   With  Symptoms   Starting    Yesterday     Pt  Is  Awake  As  Well as  Alert  And  Oriented

## 2015-11-03 LAB — CULTURE, GROUP A STREP (THRC)

## 2015-12-19 ENCOUNTER — Encounter: Payer: Self-pay | Admitting: Internal Medicine

## 2016-07-24 ENCOUNTER — Encounter (HOSPITAL_COMMUNITY): Payer: Self-pay | Admitting: Emergency Medicine

## 2016-07-24 ENCOUNTER — Ambulatory Visit (INDEPENDENT_AMBULATORY_CARE_PROVIDER_SITE_OTHER): Payer: Self-pay

## 2016-07-24 ENCOUNTER — Ambulatory Visit (HOSPITAL_COMMUNITY)
Admission: EM | Admit: 2016-07-24 | Discharge: 2016-07-24 | Disposition: A | Discharge status: Home / Self Care | Payer: BLUE CROSS/BLUE SHIELD | Attending: Family Medicine | Admitting: Family Medicine

## 2016-07-24 DIAGNOSIS — M79672 Pain in left foot: Secondary | ICD-10-CM

## 2016-07-24 NOTE — Discharge Instructions (Signed)
No fracture seen today but will x-ray again if foot pain not resolving in 1-2 weeks.

## 2016-07-24 NOTE — ED Provider Notes (Signed)
  [place]   [id number] 07/24/16 Arrival Time: 2094  ASSESSMENT & PLAN:  1. Left foot pain   - without injury  No fracture seen.  Will treat as inflammatory problem. May continue to wear walking boot. Ibuprofen 800mg  TID with food for the next 3-5 days. Recommend f/u if not improving. Patient verbalized understanding. After Visit Summary given along with work note.   SUBJECTIVE:  [name] is a 56 y.o. female who presents with complaint of left volar foot pain for 3 days. No injury or h/o similar. Mild swelling. No skin changes. No injury. Able to sleep at night. No OTC medications. Using walking boot with moderate relief. No extremity sensation changes.  ROS: As per HPI.   OBJECTIVE:  Vitals:   07/24/16 1036  BP: 135/77  Pulse: 82  Temp: 98.1 F (36.7 C)  TempSrc: Oral  SpO2: 99%     General appearance: alert, cooperative, appears stated age and no distress Extremities: left volar foot with mild swelling and tenderness; no deformity; FROM Skin: Skin color, texture, turgor normal. No rashes or lesions. Lymph nodes: no lymphadenopathy Neurologic: Sensation intact LLE  Imaging:  Dg Foot Complete Left  Result Date: 07/24/2016 CLINICAL DATA:  Pain for several days EXAM: LEFT FOOT - COMPLETE 3+ VIEW COMPARISON:  None. FINDINGS: Frontal, oblique, and lateral views were obtained. There is no fracture or dislocation. Joint spaces appear normal. No erosive change. There is a small posterior calcaneal spur. There is soft tissue swelling along the volar aspect of the foot. IMPRESSION: Tissue swelling along the volar aspect of foot. No fracture or dislocation. No appreciable arthropathy. Small posterior calcaneal spur. Electronically Signed   By: Lowella Grip III M.D.   On: 07/24/2016 11:20    Allergies  Allergen Reactions  . Ivp Dye [Iodinated Diagnostic Agents] Swelling    "skin came off"  . Codeine Itching  . Other     NO PLASTIC TAPE    PMHx,  SurgHx, SocialHx, Medications, and Allergies were reviewed in the Visit Navigator and updated as appropriate.       Vanessa Kick, MD 07/24/16 1200

## 2016-07-24 NOTE — ED Triage Notes (Signed)
Pt reports pain along the top of her left foot for the last three days.  She states she has sever pain with ambulation.  She presents today with a cam walker on her left foot from a previous problem with the heel of her foot.

## 2016-10-29 ENCOUNTER — Telehealth: Payer: Self-pay | Admitting: General Practice

## 2016-10-29 NOTE — Telephone Encounter (Signed)
[facility] calling in reference to referral faxed over on 10/21/16 for patient. Please call and advise.

## 2017-07-10 ENCOUNTER — Encounter: Payer: Self-pay | Admitting: Neurology

## 2017-07-10 ENCOUNTER — Other Ambulatory Visit: Payer: Self-pay

## 2017-07-10 ENCOUNTER — Ambulatory Visit: Payer: BLUE CROSS/BLUE SHIELD | Admitting: Neurology

## 2017-07-10 ENCOUNTER — Telehealth: Payer: Self-pay | Admitting: Neurology

## 2017-07-10 VITALS — BP 152/90 | HR 78 | Resp 20 | Ht 68.5 in | Wt 324.0 lb

## 2017-07-10 DIAGNOSIS — Z794 Long term (current) use of insulin: Secondary | ICD-10-CM | POA: Diagnosis not present

## 2017-07-10 DIAGNOSIS — M5431 Sciatica, right side: Secondary | ICD-10-CM

## 2017-07-10 DIAGNOSIS — M545 Low back pain, unspecified: Secondary | ICD-10-CM | POA: Insufficient documentation

## 2017-07-10 DIAGNOSIS — E119 Type 2 diabetes mellitus without complications: Secondary | ICD-10-CM

## 2017-07-10 DIAGNOSIS — M5441 Lumbago with sciatica, right side: Secondary | ICD-10-CM | POA: Diagnosis not present

## 2017-07-10 MED ORDER — ETODOLAC 400 MG PO TABS: 400.0000 mg | ORAL_TABLET | Freq: Two times a day (BID) | ORAL | 5 refills | Status: DC

## 2017-07-10 NOTE — Progress Notes (Signed)
[facility]  PATIENT: [name] DOB: February 04, 1961  REFERRING DOCTOR OR PCP:  Dr Vista Lawman SOURCE: Patient, notes from PCP  _________________________________   HISTORICAL  CHIEF COMPLAINT:  Chief Complaint  Patient presents with  . Extremity Weakness    [name] is here with c/o pain, heavy sensation both feet legs, and right hip pain, onset November 2018  Sts. seen by ortho [facility] for same and had MRI left leg there. Sts. is seeing podiatry and has had inj. in left foot which has helped. Denies having imaging studies of lower back/fim    HISTORY OF PRESENT ILLNESS:  I had the pleasure of seeing your patient, [name], at [facility] for a neurologic consultation regarding her weakness.  She is a 57 year old woman who began to experience pain and a heavy sensation in her legs since November 2018.     There was no preceding event.   She just started noting pain in the left foot initially.   She went to [place] and she had an MRI that was reportedly normal.    She was referred to a foot/ankle doctor (Dr. Doran Durand) who did a shot in the left foot.   Pain improved in the foot.   Also since November 2018, she has a heavy sensation in her proximal legs and she has more trouble getting out of a chair and walking.      The right hip is painful and this started shortly after her left foot pain began.   She feels she had to put a lot of weight on the right leg.   The right hip pain is worse when she sits on the right buttock.   She has less pain when she uses a recliner.       She has not had any lumbar or hip imaging or injections.    She gets some benefit from Duexis (Ibuprofen/famotidine)  Her gait is reduced due to the heavy sensation and her right hip pain     To get out of a chair she needs to scoot forward and then use her arms.    She denies any weakness in her arms, shoulders or face.   No change imm speech/swallow.    No ptosis or diplopia.       REVIEW OF SYSTEMS: Constitutional: No fevers, chills, sweats, or change in appetite Eyes: No visual changes, double vision, eye pain Ear, nose and throat: No hearing loss, ear pain, nasal congestion, sore throat Cardiovascular: No chest pain, palpitations Respiratory: No shortness of breath at rest or with exertion.   No wheezes GastrointestinaI: No nausea, vomiting, diarrhea, abdominal pain, fecal incontinence Genitourinary: No dysuria, urinary retention or frequency.  No nocturia. Musculoskeletal: as above Integumentary: No rash, pruritus, skin lesions Neurological: as above Psychiatric: No depression at this time.  No anxietylbs Endocrine: Has DMtype 2 takes insulin.   Weight is 320  Hematologic/Lymphatic: No anemia, purpura, petechiae. Allergic/Immunologic: No itchy/runny eyes, nasal congestion, recent allergic reactions, rashes  ALLERGIES: Allergies  Allergen Reactions  . Ivp Dye [Iodinated Diagnostic Agents] Swelling    "skin came off"  . Codeine Itching  . Other     NO PLASTIC TAPE    HOME MEDICATIONS:  Current Outpatient Medications:  .  aspirin EC 81 MG tablet, Take 81 mg by mouth daily., Disp: , Rfl:  .  diclofenac (VOLTAREN) 75 MG EC tablet, Take 1 tablet (75 mg total) by mouth 2 (two) times daily. Take with food, Disp:  30 tablet, Rfl: 0 .  glipiZIDE (GLUCOTROL) 5 MG tablet, Take 5 mg by mouth 2 (two) times daily before a meal., Disp: , Rfl:  .  insulin aspart (NOVOLOG) 100 UNIT/ML injection, Inject 16 Units into the skin 3 (three) times daily before meals. Use sliding scale, Disp: , Rfl:  .  levothyroxine (SYNTHROID, LEVOTHROID) 175 MCG tablet, Take 175 mcg by mouth daily before breakfast., Disp: , Rfl:  .  Liraglutide (VICTOZA [place]), Inject into the skin., Disp: , Rfl:  .  potassium chloride SA (K-DUR,KLOR-CON) 20 MEQ tablet, Take 20 mEq by mouth as needed (muscle aches/cramps). , Disp: , Rfl:  .  triamterene-hydrochlorothiazide  (MAXZIDE) 75-50 MG per tablet, Take 0.5 tablets by mouth daily., Disp: 30 tablet, Rfl: 1 .  isosorbide mononitrate (IMDUR) 30 MG 24 hr tablet, Take 1 tablet (30 mg total) by mouth daily., Disp: 30 tablet, Rfl: 6 .  lisinopril (PRINIVIL) 10 MG tablet, Take 1 tablet (10 mg total) by mouth daily., Disp: 30 tablet, Rfl: 1 .  nitroGLYCERIN (NITROSTAT) 0.4 MG SL tablet, Place 1 tablet (0.4 mg total) under the tongue every 5 (five) minutes as needed for chest pain (up to 3 doses)., Disp: 25 tablet, Rfl: 3  PAST MEDICAL HISTORY: Past Medical History:  Diagnosis Date  . ALLERGIC RHINITIS   . 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 Asthma    Questionable  . Diabetes mellitus, type 2 ([place])   . History of tobacco abuse    Quit 2011  . Hypertension   . Hypertriglyceridemia   . Hypothyroidism   . NSTEMI (non-ST elevated myocardial infarction) ([place]) 09/30/11   NO CAD by cath, ? coronary vasospasm  . Obesity     PAST SURGICAL HISTORY: Past Surgical History:  Procedure Laterality Date  . LEFT HEART CATHETERIZATION WITH CORONARY ANGIOGRAM N/A 09/30/2011   Procedure: LEFT HEART CATHETERIZATION WITH CORONARY ANGIOGRAM;  Surgeon: Burnell Blanks, MD;  Location: [facility] CATH LAB;  Service: Cardiovascular;  Laterality: N/A;  . TUBAL LIGATION    . VESICOVAGINAL FISTULA CLOSURE W/ TAH      FAMILY HISTORY: Family History  Problem Relation Age of Onset  . Heart disease Mother        has pacemaker  . Diabetes Mother   . Diabetes Unknown        strong family history  . Cancer Maternal Uncle     SOCIAL HISTORY:  Social History   Socioeconomic History  . Marital status: Divorced    Spouse name: Not on file  . Number of children: 3  . Years of education: Not on file  . Highest education level: Not on file  Occupational History  . Occupation: Freight forwarder at [employer].  Social Needs  . Financial resource strain: Not on file  . Food insecurity:    Worry: Not on file    Inability: Not on file  . Transportation needs:      Medical: Not on file    Non-medical: Not on file  Tobacco Use  . Smoking status: Former Smoker    Packs/day: 0.80    Years: 36.00    Pack years: 28.80    Types: Cigarettes    Last attempt to quit: 07/04/2010    Years since quitting: 7.0  . Smokeless tobacco: Never Used  Substance and Sexual Activity  . Alcohol use: Yes    Comment: VERY RARE  . Drug use: No  . Sexual activity: Not on file  Lifestyle  . Physical activity:  Days per week: Not on file    Minutes per session: Not on file  . Stress: Not on file  Relationships  . Social connections:    Talks on phone: Not on file    Gets together: Not on file    Attends religious service: Not on file    Active member of club or organization: Not on file    Attends meetings of clubs or organizations: Not on file    Relationship status: Not on file  . Intimate partner violence:    Fear of current or ex partner: Not on file    Emotionally abused: Not on file    Physically abused: Not on file    Forced sexual activity: Not on file  Other Topics Concern  . Not on file  Social History Narrative  . Not on file     PHYSICAL EXAM  Vitals:   07/10/17 1036  BP: (!) 152/90  Pulse: 78  Resp: 20  Weight: (!) 324 lb (147 kg)  Height: 5' 8.5" (1.74 m)    Body mass index is 48.55 kg/m.   General: The patient is well-developed and well-nourished and in no acute distress    Neck: The neck is supple, no carotid bruits are noted.  The neck is nontender.  Cardiovascular: The heart has a regular rate and rhythm with a normal S1 and S2. There were no murmurs, gallops or rubs.    Skin: Extremities are without significant edema.  Musculoskeletal: She has mild lower lumbar and piriformis tenderness on the right.  There is no tenderness over the trochanteric bursa.  Neurologic Exam  Mental status: The patient is alert and oriented x 3 at the time of the examination. The patient has apparent normal recent and remote memory, with  an apparently normal attention span and concentration ability.   Speech is normal.  Cranial nerves: Extraocular movements are full.   Facial symmetry is present. There is good facial sensation to soft touch bilaterally.Facial strength is normal.  Trapezius and sternocleidomastoid strength is normal. No dysarthria is noted.  The tongue is midline, and the patient has symmetric elevation of the soft palate. No obvious hearing deficits are noted.  Motor:  Muscle bulk is normal.   Tone is normal. Strength is  5 / 5 in all 4 extremities.   Sensory: She has mild reduced sensation to touch in the L5 distribution of the right foot.   Coordination: Cerebellar testing reveals good finger-nose-finger and heel-to-shin bilaterally.  Gait and station: Station is normal.   Gait is normal. Tandem gait is normal. Romberg is negative.   Reflexes: Deep tendon reflexes are 1 and symmetric in the arms and trace at the knees and absent at the ankles.   Plantar responses are flexor.    DIAGNOSTIC DATA (LABS, IMAGING, TESTING) - I reviewed patient records, labs, notes, testing and imaging myself where available.  Lab Results  Component Value Date   WBC 10.3 01/11/2014   HGB 14.6 01/11/2014   HCT 43.0 01/11/2014   MCV 88.3 01/11/2014   PLT 301 01/11/2014      Component Value Date/Time   NA 141 01/11/2014 1016   K 4.0 01/11/2014 1016   CL 108 01/11/2014 1016   CO2 21 10/03/2011 2220   GLUCOSE 203 (H) 01/11/2014 1016   BUN 15 01/11/2014 1016   CREATININE 0.70 01/11/2014 1016   CALCIUM 9.5 10/03/2011 2220   PROT 8.6 (H) 10/03/2011 1818   ALBUMIN 4.0 10/03/2011 1818  AST 20 10/03/2011 1818   ALT 31 10/03/2011 1818   ALKPHOS 103 10/03/2011 1818   BILITOT 0.4 10/03/2011 1818   GFRNONAA 54 (L) 10/03/2011 2220   GFRAA 63 (L) 10/03/2011 2220   Lab Results  Component Value Date   CHOL 181 09/30/2011   HDL 44 09/30/2011   LDLCALC UNABLE TO CALCULATE IF TRIGLYCERIDE OVER 400 mg/dL 09/30/2011   TRIG 585  (H) 09/30/2011   CHOLHDL 4.1 09/30/2011   Lab Results  Component Value Date   HGBA1C 11.5 (H) 09/29/2011   No results found for: VITAMINB12 Lab Results  Component Value Date   TSH 10.015 (H) 09/29/2011       ASSESSMENT AND PLAN  Right sided sciatica - Plan: Ambulatory referral to Physical Therapy, MR LUMBAR SPINE WO CONTRAST, CANCELED: MR LUMBAR SPINE WO CONTRAST  Right-sided low back pain with right-sided sciatica, unspecified chronicity - Plan: Ambulatory referral to Physical Therapy, MR LUMBAR SPINE WO CONTRAST, CANCELED: MR LUMBAR SPINE WO CONTRAST  Type 2 diabetes mellitus without complication, with long-term current use of insulin ([place])  Morbid obesity ([place])   In summary, Ms. Lohmeyer is a 57 year old woman with right-sided back pain and sciatica/radiculopathy that began shortly after left foot pain led to changes in her gait.  She has not responded well to conservative therapy and I will have her start physical therapy and we will check an MRI of the lumbar spine as she likely has a radiculopathy on the right.  I discussed with her that based on the results of the MRI we may also want to refer her for an epidural steroid injection.  Additionally I will start her on etodolac instead of the ibuprofen/famotidine to see if her pain does better.  She will return to see me in 3 months or sooner if there are new or worsening neurologic symptoms and she is advised to call us back in about a month if she is not any better.  Thank you for asking me to see Ms. Zurcher.  Please let me know if I can be of further assistance with her or other patients in the future.   Dannelle Rhymes A. Felecia Shelling, MD, [facility] 02/09/1094, 04:54 AM Certified in Neurology, Clinical Neurophysiology, Sleep Medicine, Pain Medicine and Neuroimaging  [facility] [address], [place], [place] [address] [phone]

## 2017-07-10 NOTE — Telephone Encounter (Signed)
[employer] Auth: [id number] (exp. 07/10/17 to 08/08/17) order sent to GI. They will reach out to the pt to schedule.

## 2017-07-15 ENCOUNTER — Ambulatory Visit
Admission: RE | Admit: 2017-07-15 | Discharge: 2017-07-15 | Disposition: A | Discharge status: Home / Self Care | Payer: BLUE CROSS/BLUE SHIELD | Source: Ambulatory Visit | Attending: Neurology | Admitting: Neurology

## 2017-07-15 DIAGNOSIS — M5431 Sciatica, right side: Secondary | ICD-10-CM

## 2017-07-15 DIAGNOSIS — M5441 Lumbago with sciatica, right side: Secondary | ICD-10-CM | POA: Diagnosis not present

## 2017-07-16 ENCOUNTER — Telehealth: Payer: Self-pay | Admitting: *Deleted

## 2017-07-16 ENCOUNTER — Other Ambulatory Visit: Payer: Self-pay | Admitting: *Deleted

## 2017-07-16 DIAGNOSIS — M5431 Sciatica, right side: Secondary | ICD-10-CM

## 2017-07-16 DIAGNOSIS — M5441 Lumbago with sciatica, right side: Secondary | ICD-10-CM

## 2017-07-16 NOTE — Telephone Encounter (Signed)
-----   Message from Britt Bottom, MD sent at 07/15/2017  6:38 PM EDT ----- Please let him know that the MRI of the lumbar spine shows some disc degenerative changes that are most pronounced at L4-L5.  There is no nerve root compression, however.  If she does not note any improvement we can refer for an epidural steroid injection at L4-L5 to the right

## 2017-07-16 NOTE — Progress Notes (Signed)
Right L4-L5 esi ordered due to pt's continued right sided lower back and leg pain/fim

## 2017-07-16 NOTE — Telephone Encounter (Signed)
Spoke with Chasady and reviewed below MRI results.  She verbalized understanding of same. Sts. pain is no better, she is having trouble walking due to pain--is currently using a crutch, and would like to try esi.  Right L4-L5 esi ordered to be done at [facility]

## 2017-07-17 ENCOUNTER — Telehealth: Payer: Self-pay | Admitting: Neurology

## 2017-07-17 NOTE — Telephone Encounter (Signed)
Patient is allergic to IVP Dye Patient will have to be scheduled at the hospital patient needs to scheduled. Per nurses at [facility] she will have to scheduled. Spoke to [place] at GI

## 2017-07-21 NOTE — Telephone Encounter (Signed)
I have sent referral to Dr. Maryjean Ka . Patient is aware.

## 2017-07-21 NOTE — Telephone Encounter (Signed)
Called Dr. Maryjean Ka left message to make sure they received referral .

## 2017-07-23 ENCOUNTER — Telehealth: Payer: Self-pay | Admitting: Student

## 2017-07-23 NOTE — Telephone Encounter (Signed)
Patient schedule for [facility] 08/01/17.  She has a IVP dye allergy.  Premedication called into the [facility] on [address]  Damaris Schooner with patient regarding instructions and to inform that prescription should be picked up.  All questions answered.   Brynda Greathouse, MS RD PA-C 1:17 PM

## 2017-07-23 NOTE — Telephone Encounter (Signed)
Patient will have her epidural steroid injection 07/25/2017 arrive at 10:45 for 11:00 with Dr. Maryjean Ka. Patient is aware.

## 2017-08-01 ENCOUNTER — Ambulatory Visit (HOSPITAL_COMMUNITY): Admission: RE | Admit: 2017-08-01 | Payer: BLUE CROSS/BLUE SHIELD | Source: Ambulatory Visit

## 2017-08-11 ENCOUNTER — Telehealth: Payer: Self-pay | Admitting: Neurology

## 2017-08-11 NOTE — Telephone Encounter (Signed)
Pt said the epidural helped right low back and right leg. She is now experiencing pain only int the right hip joint. Please call to advise.

## 2017-08-11 NOTE — Telephone Encounter (Signed)
Spoke with Luzelena. She sts. she has had good relief with esi--back and leg pain are completely resolved. Now just has hip pain.  Can discuss further at her next ov/fim

## 2017-08-15 ENCOUNTER — Encounter (HOSPITAL_COMMUNITY): Payer: Self-pay | Admitting: *Deleted

## 2017-08-15 ENCOUNTER — Emergency Department (HOSPITAL_COMMUNITY): Payer: BLUE CROSS/BLUE SHIELD

## 2017-08-15 ENCOUNTER — Other Ambulatory Visit: Payer: Self-pay

## 2017-08-15 ENCOUNTER — Emergency Department (HOSPITAL_COMMUNITY)
Admission: EM | Admit: 2017-08-15 | Discharge: 2017-08-15 | Disposition: A | Discharge status: Home / Self Care | Payer: BLUE CROSS/BLUE SHIELD | Attending: Emergency Medicine | Admitting: Emergency Medicine

## 2017-08-15 DIAGNOSIS — E119 Type 2 diabetes mellitus without complications: Secondary | ICD-10-CM | POA: Insufficient documentation

## 2017-08-15 DIAGNOSIS — E039 Hypothyroidism, unspecified: Secondary | ICD-10-CM | POA: Insufficient documentation

## 2017-08-15 DIAGNOSIS — Z7982 Long term (current) use of aspirin: Secondary | ICD-10-CM | POA: Insufficient documentation

## 2017-08-15 DIAGNOSIS — Z87891 Personal history of nicotine dependence: Secondary | ICD-10-CM | POA: Diagnosis not present

## 2017-08-15 DIAGNOSIS — Z794 Long term (current) use of insulin: Secondary | ICD-10-CM | POA: Insufficient documentation

## 2017-08-15 DIAGNOSIS — Z79899 Other long term (current) drug therapy: Secondary | ICD-10-CM | POA: Insufficient documentation

## 2017-08-15 DIAGNOSIS — J45909 Unspecified asthma, uncomplicated: Secondary | ICD-10-CM | POA: Diagnosis not present

## 2017-08-15 DIAGNOSIS — R079 Chest pain, unspecified: Secondary | ICD-10-CM | POA: Diagnosis present

## 2017-08-15 LAB — CBC
HEMATOCRIT: 42.7 % (ref 36.0–46.0)
Hemoglobin: 13.2 g/dL (ref 12.0–15.0)
MCH: 28.4 pg (ref 26.0–34.0)
MCHC: 30.9 g/dL (ref 30.0–36.0)
MCV: 92 fL (ref 78.0–100.0)
Platelets: 313 10*3/uL (ref 150–400)
RBC: 4.64 MIL/uL (ref 3.87–5.11)
RDW: 13.2 % (ref 11.5–15.5)
WBC: 12 10*3/uL — AB (ref 4.0–10.5)

## 2017-08-15 LAB — BASIC METABOLIC PANEL
Anion gap: 10 (ref 5–15)
BUN: 19 mg/dL (ref 6–20)
CHLORIDE: 107 mmol/L (ref 98–111)
CO2: 22 mmol/L (ref 22–32)
CREATININE: 1.04 mg/dL — AB (ref 0.44–1.00)
Calcium: 9 mg/dL (ref 8.9–10.3)
GFR calc Af Amer: >60 mL/min (ref >60)
GFR calc non Af Amer: 58 mL/min — ABNORMAL LOW (ref >60)
GLUCOSE: 229 mg/dL — AB (ref 70–99)
POTASSIUM: 4.3 mmol/L (ref 3.5–5.1)
SODIUM: 139 mmol/L (ref 135–145)

## 2017-08-15 LAB — I-STAT TROPONIN, ED
Troponin i, poc: 0.04 ng/mL (ref 0.00–0.08)
Troponin i, poc: 0.01 ng/mL (ref 0.00–0.08)

## 2017-08-15 LAB — D-DIMER, QUANTITATIVE (NOT AT ARMC): D DIMER QUANT: 0.32 ug{FEU}/mL (ref 0.00–0.50)

## 2017-08-15 MED ORDER — NITROGLYCERIN 0.4 MG SL SUBL
0.4000 mg | SUBLINGUAL_TABLET | SUBLINGUAL | Status: AC | PRN
  Administered 2017-08-15 (×3): 0.4 mg via SUBLINGUAL
  Filled 2017-08-15: qty 1

## 2017-08-15 MED ORDER — ASPIRIN 81 MG PO CHEW
162.0000 mg | CHEWABLE_TABLET | Freq: Once | ORAL | Status: AC
  Administered 2017-08-15: 162 mg via ORAL
  Filled 2017-08-15: qty 2

## 2017-08-15 NOTE — Discharge Instructions (Signed)
You came to the emergency department with chest pain and after a thorough workup heart attack and blood clots have been ruled out. We feel it is safe for you to go home and that you are not in imminent danger. Please return to the ED with new or worsening chest pain, shortness of breath, fainting, or any other new or concerning symptoms. Please f/u with your PCP in the next 3 days and consider referral to a cardiologist for further outpatient workup of chest pain. Take your home nitroglycerin as prescribed for chest pain that occurs at home.

## 2017-08-15 NOTE — ED Triage Notes (Signed)
Pt in c/o chest pain and shortness of breath with sudden onset 30 minutes ago, states she took a nitro PTA with improvement in pain, no distress noted

## 2017-08-15 NOTE — ED Provider Notes (Addendum)
[facility] EMERGENCY DEPARTMENT Provider Note   CSN: [id number] Arrival date & time: 08/15/17  1103     History   Chief Complaint Chief Complaint  Patient presents with  . Chest Pain    HPI [name] is a 57 y.o. female with a history of NSTEMI in 2013, HTN, DM, hypothyroidism, and asthma who presents to the ED for non-radiating chest pain in her mid-right chest that acutely started earlier today while she was sitting and talking on the phone. She reports that the pain started at a 9/10 and feels like an intermittent squeezing/stabbing pain. When the pain began she had associated dyspnea, dizziness, and diaphoresis. She took a nitroglycerin about 15 minutes after the pain began. This resolved the pain for a short time. The pain recurred, but to a lesser severity at about 5/10. She denies nausea or vomiting. She reports that she has had pain and swelling of her right calf recently, but can't remember when that started. She denies any recent trips or surgeries, but she had an outpatient epidural steroid procedure for back pain last month. The patient states that she has had a headache that feels like frontal pressure for the past 3 days.   Past Medical History:  Diagnosis Date  . ALLERGIC RHINITIS   . 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 Asthma    Questionable  . Diabetes mellitus, type 2 ([place])   . History of tobacco abuse    Quit 2011  . Hypertension   . Hypertriglyceridemia   . Hypothyroidism   . NSTEMI (non-ST elevated myocardial infarction) ([place]) 09/30/11   NO CAD by cath, ? coronary vasospasm  . Obesity     Patient Active Problem List   Diagnosis Date Noted  . Right sided sciatica 07/10/2017  . Right low back pain 07/10/2017  . Chest pain 09/29/2011  . Morbid obesity ([place]) 09/29/2011  . DM type 2 (diabetes mellitus, type 2) ([place]) 09/29/2011  . HTN (hypertension) 09/29/2011  . Pneumonia, bacterial 08/07/2010  . Incidental lung nodule, > 90mm and < 29mm 08/07/2010   . Bronchitis 08/07/2010    Past Surgical History:  Procedure Laterality Date  . LEFT HEART CATHETERIZATION WITH CORONARY ANGIOGRAM N/A 09/30/2011   Procedure: LEFT HEART CATHETERIZATION WITH CORONARY ANGIOGRAM;  Surgeon: Burnell Blanks, MD;  Location: [facility] LAB;  Service: Cardiovascular;  Laterality: N/A;  . TUBAL LIGATION    . VESICOVAGINAL FISTULA CLOSURE W/ TAH       OB History   None      Home Medications    Prior to Admission medications   Medication Sig Start Date End Date Taking? Authorizing Provider  aspirin EC 81 MG tablet Take 81 mg by mouth daily.    [provider]  etodolac (LODINE) 400 MG tablet Take 1 tablet (400 mg total) by mouth 2 (two) times daily. 07/10/17   Sater, Nanine Means, MD  glipiZIDE (GLUCOTROL) 5 MG tablet Take 5 mg by mouth 2 (two) times daily before a meal.    [provider]  insulin aspart (NOVOLOG) 100 UNIT/ML injection Inject 16 Units into the skin 3 (three) times daily before meals. Use sliding scale    [provider]  isosorbide mononitrate (IMDUR) 30 MG 24 hr tablet Take 1 tablet (30 mg total) by mouth daily. 10/01/11 01/11/14  Hope, Jessica A, PA-C  levothyroxine (SYNTHROID, LEVOTHROID) 175 MCG tablet Take 175 mcg by mouth daily before breakfast.    [provider]  Liraglutide (  VICTOZA [place]) Inject into the skin.    [provider]  lisinopril (PRINIVIL) 10 MG tablet Take 1 tablet (10 mg total) by mouth daily. 10/01/11 09/30/12  Hope, Jessica A, PA-C  nitroGLYCERIN (NITROSTAT) 0.4 MG SL tablet Place 1 tablet (0.4 mg total) under the tongue every 5 (five) minutes as needed for chest pain (up to 3 doses). 10/01/11 01/11/14  Hope, Jessica A, PA-C  potassium chloride SA (K-DUR,KLOR-CON) 20 MEQ tablet Take 20 mEq by mouth as needed (muscle aches/cramps).  07/04/10   [provider]  triamterene-hydrochlorothiazide (MAXZIDE) 75-50 MG per tablet Take 0.5 tablets by mouth daily. 10/01/11   Hope, Leona Carry, PA-C    Family History Family History  Problem Relation Age of Onset  . Heart disease Mother        has pacemaker  . Diabetes Mother   . Diabetes Unknown        strong family history  . Cancer Maternal Uncle     Social History Social History   Tobacco Use  . Smoking status: Former Smoker    Packs/day: 0.80    Years: 36.00    Pack years: 28.80    Types: Cigarettes    Last attempt to quit: 07/04/2010    Years since quitting: 7.1  . Smokeless tobacco: Never Used  Substance Use Topics  . Alcohol use: Yes    Comment: VERY RARE  . Drug use: No     Allergies   Ivp dye [iodinated diagnostic agents]; Codeine; and Other   Review of Systems Review of Systems  Constitutional: Negative for chills and fever.       Loss of appetite  HENT: Negative for congestion and sore throat.   Eyes: Negative for visual disturbance.  Respiratory: Negative for cough.   Cardiovascular: Positive for chest pain and leg swelling. Negative for palpitations.  Gastrointestinal: Negative for abdominal pain, nausea and vomiting.  Genitourinary: Negative for difficulty urinating and dysuria.  Musculoskeletal: Positive for back pain and neck pain.       Chronic back pain  Skin: Negative for rash and wound.  Neurological: Positive for dizziness. Negative for syncope.     Physical Exam Updated Vital Signs BP (!) 119/51   Pulse 86   Temp 98 F (36.7 C) (Oral)   Resp 18   SpO2 100%   Physical Exam  Constitutional: She is oriented to person, place, and time. She appears well-developed and well-nourished. She does not appear ill.  HENT:  Head: Normocephalic and atraumatic.  Eyes: Pupils are equal, round, and reactive to light. EOM are normal.  Neck: Normal range of motion. Neck supple.  Cardiovascular: Normal rate and regular rhythm. Exam reveals no gallop and no friction rub.  No murmur heard. Pulses:      Carotid pulses are 2+ on the right side, and 2+ on the left side.      Radial  pulses are 2+ on the right side, and 2+ on the left side.       Dorsalis pedis pulses are 2+ on the right side, and 2+ on the left side.       Posterior tibial pulses are 2+ on the right side, and 2+ on the left side.  Pulmonary/Chest: Effort normal and breath sounds normal. No respiratory distress.  Abdominal: Soft. Bowel sounds are normal. She exhibits no distension. There is no tenderness.  Musculoskeletal:  Right calf tender to palpation. No appreciable edema or erythema.  Neurological: She is alert and oriented to  person, place, and time.  Skin: Skin is warm and dry. Capillary refill takes less than 2 seconds. No rash noted.  Psychiatric: She has a normal mood and affect. Her behavior is normal.     ED Treatments / Results  Labs (all labs ordered are listed, but only abnormal results are displayed) Labs Reviewed  BASIC METABOLIC PANEL - Abnormal; Notable for the following components:      Result Value   Glucose, Bld 229 (*)    Creatinine, Ser 1.04 (*)    GFR calc non Af Amer 58 (*)    All other components within normal limits  CBC - Abnormal; Notable for the following components:   WBC 12.0 (*)    All other components within normal limits  D-DIMER, QUANTITATIVE (NOT AT [facility])  I-STAT TROPONIN, ED  I-STAT TROPONIN, ED    EKG EKG Interpretation  Date/Time:  Friday August 15 2017 11:09:01 EDT Ventricular Rate:  92 PR Interval:  142 QRS Duration: 80 QT Interval:  378 QTC Calculation: 467 R Axis:   69 Text Interpretation:  Normal sinus rhythm Cannot rule out Anterior infarct , age undetermined Abnormal ECG No significant change since last tracing Confirmed by Deno Etienne [phone]) on 08/15/2017 12:14:07 PM   Radiology Dg Chest 2 View  Result Date: 08/15/2017 CLINICAL DATA:  Chest pain for 3 days EXAM: CHEST - 2 VIEW COMPARISON:  None. FINDINGS: The heart size and mediastinal contours are within normal limits. Both lungs are clear. The visualized skeletal structures are  unremarkable. IMPRESSION: No active cardiopulmonary disease. Electronically Signed   By: Kathreen Devoid   On: 08/15/2017 11:36    Procedures Procedures (including critical care time)  Medications Ordered in ED Medications  aspirin chewable tablet 162 mg (162 mg Oral Given 08/15/17 1301)  nitroGLYCERIN (NITROSTAT) SL tablet 0.4 mg (0.4 mg Sublingual Given 08/15/17 1352)     Initial Impression / Assessment and Plan / ED Course  I have reviewed the triage vital signs and the nursing notes.  Pertinent labs & imaging results that were available during my care of the patient were reviewed by me and considered in my medical decision making (see chart for details).  [name] is a 57 y.o. female with a history of NSTEMI in 2013, HTN, DM, hypothyroidism, and asthma who presents to the ED for non-radiating chest pain in her mid-right chest that acutely started while at rest, was associated with diaphoresis and dyspnea, and was partially relieved by nitroglycerin. DDx included ACS and PE. Upon arrival to the ED, pt was afebrile and hemodynamically stable. Physical exam was significant right calf pain, but no appreciable lower extremity swelling. Labs were significant for negative troponin x2 and negative d-dimer. EKG showed no changes compared to prior. CXR was unremarkable. Her chest pain resolved with sublingual nitro while in the ED. She was also given aspirin. Workup ruled out life-threatening causes of chest pain including ACS and PE. Patient deemed safe for discharge home with return precautions of worsening chest pain, dyspnea, syncope, or other new or concerning symptoms. Patient advised to f/u with her cardiologist for outpatient evaluation of chest pain.   Final Clinical Impressions(s) / ED Diagnoses   Final diagnoses:  Chest pain, unspecified type    ED Discharge Orders    None       Dorrell, Andree Elk, MD 08/15/17 0932    Corinne Ports, MD 08/15/17 2059    Deno Etienne,  DO 08/15/17 2104    Deno Etienne, DO  08/15/17 2106  

## 2017-08-15 NOTE — ED Triage Notes (Signed)
PT denies CP

## 2017-08-17 ENCOUNTER — Emergency Department (HOSPITAL_COMMUNITY): Payer: BLUE CROSS/BLUE SHIELD

## 2017-08-17 ENCOUNTER — Inpatient Hospital Stay (HOSPITAL_COMMUNITY)
Admission: EM | Admit: 2017-08-17 | Discharge: 2017-08-20 | DRG: 247 | Disposition: A | Discharge status: Home / Self Care | Payer: BLUE CROSS/BLUE SHIELD | Attending: Cardiology | Admitting: Cardiology

## 2017-08-17 ENCOUNTER — Encounter (HOSPITAL_COMMUNITY): Payer: Self-pay | Admitting: Emergency Medicine

## 2017-08-17 DIAGNOSIS — I214 Non-ST elevation (NSTEMI) myocardial infarction: Secondary | ICD-10-CM | POA: Diagnosis not present

## 2017-08-17 DIAGNOSIS — E039 Hypothyroidism, unspecified: Secondary | ICD-10-CM | POA: Diagnosis present

## 2017-08-17 DIAGNOSIS — Z791 Long term (current) use of non-steroidal anti-inflammatories (NSAID): Secondary | ICD-10-CM

## 2017-08-17 DIAGNOSIS — I25110 Atherosclerotic heart disease of native coronary artery with unstable angina pectoris: Secondary | ICD-10-CM | POA: Diagnosis present

## 2017-08-17 DIAGNOSIS — E119 Type 2 diabetes mellitus without complications: Secondary | ICD-10-CM

## 2017-08-17 DIAGNOSIS — I252 Old myocardial infarction: Secondary | ICD-10-CM

## 2017-08-17 DIAGNOSIS — E782 Mixed hyperlipidemia: Secondary | ICD-10-CM | POA: Diagnosis present

## 2017-08-17 DIAGNOSIS — E1165 Type 2 diabetes mellitus with hyperglycemia: Secondary | ICD-10-CM | POA: Diagnosis present

## 2017-08-17 DIAGNOSIS — Z955 Presence of coronary angioplasty implant and graft: Secondary | ICD-10-CM

## 2017-08-17 DIAGNOSIS — Z6841 Body Mass Index (BMI) 40.0 and over, adult: Secondary | ICD-10-CM

## 2017-08-17 DIAGNOSIS — Z87891 Personal history of nicotine dependence: Secondary | ICD-10-CM

## 2017-08-17 DIAGNOSIS — Z7982 Long term (current) use of aspirin: Secondary | ICD-10-CM

## 2017-08-17 DIAGNOSIS — Z91041 Radiographic dye allergy status: Secondary | ICD-10-CM

## 2017-08-17 DIAGNOSIS — Z8249 Family history of ischemic heart disease and other diseases of the circulatory system: Secondary | ICD-10-CM

## 2017-08-17 DIAGNOSIS — I10 Essential (primary) hypertension: Secondary | ICD-10-CM | POA: Diagnosis present

## 2017-08-17 DIAGNOSIS — R079 Chest pain, unspecified: Secondary | ICD-10-CM | POA: Diagnosis present

## 2017-08-17 DIAGNOSIS — Z885 Allergy status to narcotic agent status: Secondary | ICD-10-CM

## 2017-08-17 DIAGNOSIS — Z79899 Other long term (current) drug therapy: Secondary | ICD-10-CM

## 2017-08-17 DIAGNOSIS — Z91048 Other nonmedicinal substance allergy status: Secondary | ICD-10-CM

## 2017-08-17 DIAGNOSIS — Z794 Long term (current) use of insulin: Secondary | ICD-10-CM

## 2017-08-17 DIAGNOSIS — K219 Gastro-esophageal reflux disease without esophagitis: Secondary | ICD-10-CM | POA: Diagnosis present

## 2017-08-17 LAB — CBC
HCT: 39 % (ref 36.0–46.0)
Hemoglobin: 12.3 g/dL (ref 12.0–15.0)
MCH: 28.8 pg (ref 26.0–34.0)
MCHC: 31.5 g/dL (ref 30.0–36.0)
MCV: 91.3 fL (ref 78.0–100.0)
Platelets: 306 10*3/uL (ref 150–400)
RBC: 4.27 MIL/uL (ref 3.87–5.11)
RDW: 13.2 % (ref 11.5–15.5)
WBC: 13.8 10*3/uL — ABNORMAL HIGH (ref 4.0–10.5)

## 2017-08-17 LAB — BASIC METABOLIC PANEL
Anion gap: 12 (ref 5–15)
BUN: 17 mg/dL (ref 6–20)
CALCIUM: 8.9 mg/dL (ref 8.9–10.3)
CO2: 19 mmol/L — AB (ref 22–32)
Chloride: 107 mmol/L (ref 98–111)
Creatinine, Ser: 1.14 mg/dL — ABNORMAL HIGH (ref 0.44–1.00)
GFR calc Af Amer: >60 mL/min (ref >60)
GFR calc non Af Amer: 52 mL/min — ABNORMAL LOW (ref >60)
Glucose, Bld: 263 mg/dL — ABNORMAL HIGH (ref 70–99)
POTASSIUM: 3.7 mmol/L (ref 3.5–5.1)
Sodium: 138 mmol/L (ref 135–145)

## 2017-08-17 LAB — I-STAT TROPONIN, ED: TROPONIN I, POC: 0.1 ng/mL — AB (ref 0.00–0.08)

## 2017-08-17 LAB — I-STAT BETA HCG BLOOD, ED (MC, WL, AP ONLY): I-stat hCG, quantitative: <5 m[IU]/mL (ref <5)

## 2017-08-17 MED ORDER — ASPIRIN 81 MG PO CHEW
324.0000 mg | CHEWABLE_TABLET | Freq: Once | ORAL | Status: AC
Start: 2017-08-18 — End: 2017-08-18
  Administered 2017-08-18: 324 mg via ORAL
  Filled 2017-08-17: qty 4

## 2017-08-17 MED ORDER — HEPARIN SODIUM (PORCINE) 5000 UNIT/ML IJ SOLN
4000.0000 [IU] | Freq: Once | INTRAMUSCULAR | Status: AC
  Administered 2017-08-18: 4000 [IU] via INTRAVENOUS

## 2017-08-17 MED ORDER — NITROGLYCERIN IN D5W 200-5 MCG/ML-% IV SOLN
0.0000 ug/min | INTRAVENOUS | Status: DC
  Administered 2017-08-18: 5 ug/min via INTRAVENOUS
  Filled 2017-08-17: qty 250

## 2017-08-17 MED ORDER — HEPARIN (PORCINE) IN NACL 100-0.45 UNIT/ML-% IJ SOLN
1750.0000 [IU]/h | INTRAMUSCULAR | Status: DC
  Administered 2017-08-18 – 2017-08-19 (×3): 1750 [IU]/h via INTRAVENOUS
  Filled 2017-08-17 (×3): qty 250

## 2017-08-17 NOTE — ED Provider Notes (Signed)
[facility] EMERGENCY DEPARTMENT Provider Note   CSN: [id number] Arrival date & time: 08/17/17  2204     History   Chief Complaint Chief Complaint  Patient presents with  . Chest Pain    HPI [name] is a 57 y.o. female.  HPI   [name] is a 57 y.o. female, with a history of DM, MI, HTN, obesity, and hypercholesterolemia, presenting to the ED with chest pain beginning today around 9:30 PM.  Patient states she was sitting at rest when she began to have central retrosternal chest pain, sharp followed by squeezing, 10/10, radiating into the left side of the neck.  Accompanied by shortness of breath.  States she had similar pain a couple days ago, for which she was seen in the ED, but today's pain was worse.  Pain resolved following 3 nitroglycerin taken 5 to 10 minutes apart.  States it feels similar to previous MI.  Denies fever/chills, syncope, dizziness, N/V/D, peripheral edema or pain, or any other complaints.  Cardiologist: Dr. Einar Gip   Past Medical History:  Diagnosis Date  . ALLERGIC RHINITIS   . 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 Asthma    Questionable  . Diabetes mellitus, type 2 ([place])   . History of tobacco abuse    Quit 2011  . Hypertension   . Hypertriglyceridemia   . Hypothyroidism   . NSTEMI (non-ST elevated myocardial infarction) ([place]) 09/30/11   NO CAD by cath, ? coronary vasospasm  . Obesity     Patient Active Problem List   Diagnosis Date Noted  . NSTEMI (non-ST elevated myocardial infarction) ([place]) 08/18/2017  . Right sided sciatica 07/10/2017  . Right low back pain 07/10/2017  . Chest pain 09/29/2011  . Morbid obesity ([place]) 09/29/2011  . DM type 2 (diabetes mellitus, type 2) ([place]) 09/29/2011  . HTN (hypertension) 09/29/2011  . Pneumonia, bacterial 08/07/2010  . Incidental lung nodule, > 53mm and < 69mm 08/07/2010  . Bronchitis 08/07/2010    Past Surgical History:  Procedure Laterality Date  . LEFT HEART CATHETERIZATION WITH  CORONARY ANGIOGRAM N/A 09/30/2011   Procedure: LEFT HEART CATHETERIZATION WITH CORONARY ANGIOGRAM;  Surgeon: Burnell Blanks, MD;  Location: [facility] CATH LAB;  Service: Cardiovascular;  Laterality: N/A;  . TUBAL LIGATION    . VESICOVAGINAL FISTULA CLOSURE W/ TAH       OB History   None      Home Medications    Prior to Admission medications   Medication Sig Start Date End Date Taking? Authorizing Provider  aspirin EC 81 MG tablet Take 81 mg by mouth daily.   Yes [provider]  DUEXIS 800-26.6 MG TABS Take 1 tablet by mouth 3 (three) times daily. 07/17/17  Yes [provider]  glipiZIDE (GLUCOTROL) 5 MG tablet Take 5 mg by mouth 2 (two) times daily before a meal.   Yes [provider]  isosorbide mononitrate (IMDUR) 30 MG 24 hr tablet Take 1 tablet (30 mg total) by mouth daily. 10/01/11 08/18/25 Yes Hope, Jessica A, PA-C  levothyroxine (SYNTHROID, LEVOTHROID) 300 MCG tablet Take 300 mcg by mouth daily. 06/22/17  Yes [provider]  liraglutide (VICTOZA) 18 MG/3ML SOPN Inject 1.8 mg into the skin at bedtime.   Yes [provider]  nitroGLYCERIN (NITROSTAT) 0.4 MG SL tablet Place 1 tablet (0.4 mg total) under the tongue every 5 (five) minutes as needed for chest pain (up to 3 doses). 10/01/11 08/18/25 Yes Hope, Jessica A, PA-C  NOVOLOG  MIX 70/30 FLEXPEN (70-30) 100 UNIT/ML FlexPen Inject 12-16 Units into the skin 3 (three) times daily with meals. Sliding scale 08/15/17  Yes [provider]  potassium chloride SA (K-DUR,KLOR-CON) 20 MEQ tablet Take 20 mEq by mouth as needed (muscle aches/cramps).  07/04/10  Yes [provider]  triamterene-hydrochlorothiazide (MAXZIDE) 75-50 MG per tablet Take 0.5 tablets by mouth daily. 10/01/11  Yes Hope, Jessica A, PA-C  etodolac (LODINE) 400 MG tablet Take 1 tablet (400 mg total) by mouth 2 (two) times daily. Patient not taking: Reported on 08/18/2017 07/10/17   Britt Bottom, MD  lisinopril  (PRINIVIL) 10 MG tablet Take 1 tablet (10 mg total) by mouth daily. Patient not taking: Reported on 08/18/2017 10/01/11 08/18/25  Hope, Leona Carry, PA-C    Family History Family History  Problem Relation Age of Onset  . Heart disease Mother        has pacemaker  . Diabetes Mother   . Diabetes Unknown        strong family history  . Cancer Maternal Uncle     Social History Social History   Tobacco Use  . Smoking status: Former Smoker    Packs/day: 0.80    Years: 36.00    Pack years: 28.80    Types: Cigarettes    Last attempt to quit: 07/04/2010    Years since quitting: 7.1  . Smokeless tobacco: Never Used  Substance Use Topics  . Alcohol use: Yes    Comment: VERY RARE  . Drug use: No     Allergies   Ivp dye [iodinated diagnostic agents]; Codeine; and Other   Review of Systems Review of Systems  Constitutional: Negative for chills, diaphoresis and fever.  Respiratory: Positive for shortness of breath. Negative for cough.   Cardiovascular: Positive for chest pain. Negative for palpitations and leg swelling.  Gastrointestinal: Negative for abdominal pain, diarrhea, nausea and vomiting.  Musculoskeletal: Negative for back pain.  Neurological: Negative for dizziness, syncope and light-headedness.  All other systems reviewed and are negative.    Physical Exam Updated Vital Signs BP (!) 151/79 (BP Location: Right Arm)   Pulse 93   Temp 98.1 F (36.7 C) (Oral)   Resp 18   Ht 5\' 9"  (1.753 m)   Wt (!) 143.8 kg (317 lb)   SpO2 98%   BMI 46.81 kg/m   Physical Exam  Constitutional: She appears well-developed and well-nourished. No distress.  HENT:  Head: Normocephalic and atraumatic.  Eyes: Conjunctivae are normal.  Neck: Neck supple.  Cardiovascular: Normal rate, regular rhythm, normal heart sounds and intact distal pulses.  Pulmonary/Chest: Effort normal and breath sounds normal. No respiratory distress.  Abdominal: Soft. There is no tenderness. There is no  guarding.  Musculoskeletal: She exhibits no edema.  Lymphadenopathy:    She has no cervical adenopathy.  Neurological: She is alert.  Skin: Skin is warm and dry. She is not diaphoretic.  Psychiatric: She has a normal mood and affect. Her behavior is normal.  Nursing note and vitals reviewed.    ED Treatments / Results  Labs (all labs ordered are listed, but only abnormal results are displayed) Labs Reviewed  BASIC METABOLIC PANEL - Abnormal; Notable for the following components:      Result Value   CO2 19 (*)    Glucose, Bld 263 (*)    Creatinine, Ser 1.14 (*)    GFR calc non Af Amer 52 (*)    All other components within normal limits  CBC -  Abnormal; Notable for the following components:   WBC 13.8 (*)    All other components within normal limits  TROPONIN I - Abnormal; Notable for the following components:   Troponin I 0.05 (*)    All other components within normal limits  I-STAT TROPONIN, ED - Abnormal; Notable for the following components:   Troponin i, poc 0.10 (*)    All other components within normal limits  TROPONIN I  TROPONIN I  TROPONIN I  TROPONIN I  I-STAT BETA HCG BLOOD, ED ([facility], [facility], [facility] ONLY)    EKG EKG Interpretation  Date/Time:  Sunday August 17 2017 22:10:19 EDT Ventricular Rate:  89 PR Interval:  160 QRS Duration: 80 QT Interval:  388 QTC Calculation: 472 R Axis:   51 Text Interpretation:  Normal sinus rhythm Cannot rule out Anterior infarct , age undetermined Abnormal ECG No significant change since last tracing Confirmed by Addison Lank [phone]) on 08/17/2017 11:55:50 PM   Radiology Dg Chest 2 View  Result Date: 08/17/2017 CLINICAL DATA:  Chest pain and dyspnea x3 days. EXAM: CHEST - 2 VIEW COMPARISON:  08/15/2017 FINDINGS: The heart size and mediastinal contours are within normal limits. Both lungs are clear. The visualized skeletal structures are unremarkable. IMPRESSION: No active cardiopulmonary disease. Electronically Signed   By: Ashley Royalty M.D.   On: 08/17/2017 22:53    Procedures .Critical Care Performed by: Lorayne Bender, PA-C Authorized by: Lorayne Bender, PA-C   Critical care provider statement:    Critical care time (minutes):  35   Critical care was necessary to treat or prevent imminent or life-threatening deterioration of the following conditions: NSTEMI.   Critical care was time spent personally by me on the following activities:  Development of treatment plan with patient or surrogate, discussions with consultants, examination of patient, obtaining history from patient or surrogate, review of old charts, re-evaluation of patient's condition, pulse oximetry, ordering and review of radiographic studies, ordering and review of laboratory studies and ordering and performing treatments and interventions   I assumed direction of critical care for this patient from another provider in my specialty: no     (including critical care time)  Medications Ordered in ED Medications  heparin ADULT infusion 100 units/mL (25000 units/298mL sodium chloride 0.45%) (1,750 Units/hr Intravenous New Bag/Given 08/18/17 0039)  nitroGLYCERIN 50 mg in dextrose 5 % 250 mL (0.2 mg/mL) infusion (5 mcg/min Intravenous New Bag/Given 08/18/17 0042)  aspirin chewable tablet 324 mg (324 mg Oral Given 08/18/17 0037)  heparin injection 4,000 Units (4,000 Units Intravenous Given 08/18/17 0045)     Initial Impression / Assessment and Plan / ED Course  I have reviewed the triage vital signs and the nursing notes.  Pertinent labs & imaging results that were available during my care of the patient were reviewed by me and considered in my medical decision making (see chart for details).  Clinical Course as of Aug 19 103  Sun Aug 17, 2017  2342 Patient not yet in room.   [SJ]  Mon Aug 18, 2017  9449 Spoke with Dr. Einar Gip, cardiologist.  Rozetta Nunnery with treatment for NSTEMI with heparin. States he will admit the patient.  Keep patient NPO.  Troponins every 6  hours.   [SJ]    Clinical Course User Index [SJ] Joy, Shawn C, PA-C    Patient presents with chest pain, relieved by nitroglycerin.  Positive troponin.  We will treat as NSTEMI and admit.  Findings and plan of care discussed with  Addison Lank, MD. Dr. Leonette Monarch personally evaluated and examined this patient.  Vitals:   08/17/17 2208 08/17/17 2209 08/18/17 0015 08/18/17 0030  BP:  (!) 151/79 (!) 142/72 (!) 155/84  Pulse:  93 90 82  Resp:  18 18 15   Temp:  98.1 F (36.7 C)    TempSrc:  Oral    SpO2:  98% 99% 100%  Weight: (!) 143.8 kg (317 lb)     Height: 5\' 9"  (1.753 m)        Final Clinical Impressions(s) / ED Diagnoses   Final diagnoses:  NSTEMI (non-ST elevated myocardial infarction) [facility])    ED Discharge Orders    None       Layla Maw 08/18/17 0105    Fatima Blank, MD 08/18/17 0147

## 2017-08-17 NOTE — ED Triage Notes (Signed)
Pt reports chest pain and shortness of breath x3 days, reports sore throat as well. Seen three days ago for same. Tonight took 3 nitro tabs and they were effective for pain.

## 2017-08-18 ENCOUNTER — Other Ambulatory Visit: Payer: Self-pay

## 2017-08-18 DIAGNOSIS — Z7982 Long term (current) use of aspirin: Secondary | ICD-10-CM | POA: Diagnosis not present

## 2017-08-18 DIAGNOSIS — I10 Essential (primary) hypertension: Secondary | ICD-10-CM | POA: Diagnosis not present

## 2017-08-18 DIAGNOSIS — E1165 Type 2 diabetes mellitus with hyperglycemia: Secondary | ICD-10-CM | POA: Diagnosis not present

## 2017-08-18 DIAGNOSIS — I25110 Atherosclerotic heart disease of native coronary artery with unstable angina pectoris: Secondary | ICD-10-CM | POA: Diagnosis not present

## 2017-08-18 DIAGNOSIS — I214 Non-ST elevation (NSTEMI) myocardial infarction: Secondary | ICD-10-CM | POA: Diagnosis not present

## 2017-08-18 DIAGNOSIS — Z794 Long term (current) use of insulin: Secondary | ICD-10-CM | POA: Diagnosis not present

## 2017-08-18 DIAGNOSIS — Z79899 Other long term (current) drug therapy: Secondary | ICD-10-CM | POA: Diagnosis not present

## 2017-08-18 DIAGNOSIS — K219 Gastro-esophageal reflux disease without esophagitis: Secondary | ICD-10-CM | POA: Diagnosis not present

## 2017-08-18 DIAGNOSIS — E782 Mixed hyperlipidemia: Secondary | ICD-10-CM | POA: Diagnosis not present

## 2017-08-18 DIAGNOSIS — Z87891 Personal history of nicotine dependence: Secondary | ICD-10-CM | POA: Diagnosis not present

## 2017-08-18 DIAGNOSIS — Z8249 Family history of ischemic heart disease and other diseases of the circulatory system: Secondary | ICD-10-CM | POA: Diagnosis not present

## 2017-08-18 DIAGNOSIS — E039 Hypothyroidism, unspecified: Secondary | ICD-10-CM | POA: Diagnosis not present

## 2017-08-18 DIAGNOSIS — Z791 Long term (current) use of non-steroidal anti-inflammatories (NSAID): Secondary | ICD-10-CM | POA: Diagnosis not present

## 2017-08-18 DIAGNOSIS — I252 Old myocardial infarction: Secondary | ICD-10-CM | POA: Diagnosis not present

## 2017-08-18 DIAGNOSIS — Z6841 Body Mass Index (BMI) 40.0 and over, adult: Secondary | ICD-10-CM | POA: Diagnosis not present

## 2017-08-18 DIAGNOSIS — Z91048 Other nonmedicinal substance allergy status: Secondary | ICD-10-CM | POA: Diagnosis not present

## 2017-08-18 DIAGNOSIS — Z885 Allergy status to narcotic agent status: Secondary | ICD-10-CM | POA: Diagnosis not present

## 2017-08-18 DIAGNOSIS — Z91041 Radiographic dye allergy status: Secondary | ICD-10-CM | POA: Diagnosis not present

## 2017-08-18 LAB — GLUCOSE, CAPILLARY
GLUCOSE-CAPILLARY: 117 mg/dL — AB (ref 70–99)
Glucose-Capillary: 102 mg/dL — ABNORMAL HIGH (ref 70–99)

## 2017-08-18 LAB — HEPARIN LEVEL (UNFRACTIONATED)
Heparin Unfractionated: 0.54 IU/mL (ref 0.30–0.70)
Heparin Unfractionated: 0.53 IU/mL (ref 0.30–0.70)

## 2017-08-18 LAB — MRSA PCR SCREENING: MRSA by PCR: NEGATIVE

## 2017-08-18 LAB — LIPID PANEL
Cholesterol: 159 mg/dL (ref 0–200)
HDL: 52 mg/dL (ref >40)
LDL Cholesterol: 76 mg/dL (ref 0–99)
Total CHOL/HDL Ratio: 3.1 RATIO
Triglycerides: 154 mg/dL — ABNORMAL HIGH (ref <150)
VLDL: 31 mg/dL (ref 0–40)

## 2017-08-18 LAB — TROPONIN I
TROPONIN I: 0.07 ng/mL — AB (ref <0.03)
Troponin I: 0.05 ng/mL (ref <0.03)
Troponin I: 0.07 ng/mL (ref <0.03)
Troponin I: 0.08 ng/mL (ref <0.03)

## 2017-08-18 LAB — HEMOGLOBIN A1C
Hgb A1c MFr Bld: 9.4 % — ABNORMAL HIGH (ref 4.8–5.6)
Mean Plasma Glucose: 223.08 mg/dL

## 2017-08-18 LAB — TSH: TSH: 0.615 u[IU]/mL (ref 0.350–4.500)

## 2017-08-18 MED ORDER — DIPHENHYDRAMINE HCL 50 MG/ML IJ SOLN
25.0000 mg | Freq: Once | INTRAMUSCULAR | Status: AC
  Administered 2017-08-19: 25 mg via INTRAVENOUS
  Filled 2017-08-18: qty 1

## 2017-08-18 MED ORDER — GLIPIZIDE 5 MG PO TABS
5.0000 mg | ORAL_TABLET | Freq: Two times a day (BID) | ORAL | Status: DC
  Administered 2017-08-18 – 2017-08-20 (×3): 5 mg via ORAL
  Filled 2017-08-18 (×5): qty 1

## 2017-08-18 MED ORDER — LIRAGLUTIDE 18 MG/3ML ~~LOC~~ SOPN: 1.8000 mg | PEN_INJECTOR | Freq: Every day | SUBCUTANEOUS | Status: DC

## 2017-08-18 MED ORDER — SODIUM CHLORIDE 0.9 % WEIGHT BASED INFUSION: 3.0000 mL/kg/h | INTRAVENOUS | Status: AC

## 2017-08-18 MED ORDER — SODIUM CHLORIDE 0.9 % WEIGHT BASED INFUSION
1.0000 mL/kg/h | INTRAVENOUS | Status: DC
  Administered 2017-08-19 (×2): 1 mL/kg/h via INTRAVENOUS

## 2017-08-18 MED ORDER — ASPIRIN EC 81 MG PO TBEC
81.0000 mg | DELAYED_RELEASE_TABLET | Freq: Every day | ORAL | Status: DC
  Administered 2017-08-18 – 2017-08-20 (×3): 81 mg via ORAL
  Filled 2017-08-18 (×3): qty 1

## 2017-08-18 MED ORDER — NITROGLYCERIN 0.4 MG SL SUBL: 0.4000 mg | SUBLINGUAL_TABLET | SUBLINGUAL | Status: DC | PRN

## 2017-08-18 MED ORDER — SODIUM CHLORIDE 0.9% FLUSH
3.0000 mL | Freq: Two times a day (BID) | INTRAVENOUS | Status: DC
  Administered 2017-08-19: 3 mL via INTRAVENOUS

## 2017-08-18 MED ORDER — DIPHENHYDRAMINE HCL 50 MG/ML IJ SOLN: 25.0000 mg | Freq: Once | INTRAMUSCULAR | Status: DC

## 2017-08-18 MED ORDER — ONDANSETRON HCL 4 MG/2ML IJ SOLN
4.0000 mg | Freq: Once | INTRAMUSCULAR | Status: AC
  Administered 2017-08-18: 4 mg via INTRAVENOUS
  Filled 2017-08-18: qty 2

## 2017-08-18 MED ORDER — SODIUM CHLORIDE 0.9 % IV SOLN: 250.0000 mL | INTRAVENOUS | Status: DC | PRN

## 2017-08-18 MED ORDER — INSULIN ASPART 100 UNIT/ML ~~LOC~~ SOLN
0.0000 [IU] | Freq: Three times a day (TID) | SUBCUTANEOUS | Status: DC
  Administered 2017-08-19: 13:00:00 7 [IU] via SUBCUTANEOUS
  Administered 2017-08-19: 18:00:00 20 [IU] via SUBCUTANEOUS
  Administered 2017-08-19: 3 [IU] via SUBCUTANEOUS
  Administered 2017-08-20: 7 [IU] via SUBCUTANEOUS

## 2017-08-18 MED ORDER — SODIUM CHLORIDE 0.9% FLUSH: 3.0000 mL | INTRAVENOUS | Status: DC | PRN

## 2017-08-18 MED ORDER — METHYLPREDNISOLONE SODIUM SUCC 125 MG IJ SOLR
125.0000 mg | Freq: Once | INTRAMUSCULAR | Status: AC
  Administered 2017-08-19: 125 mg via INTRAVENOUS
  Filled 2017-08-18: qty 2

## 2017-08-18 MED ORDER — SODIUM CHLORIDE 0.9 % WEIGHT BASED INFUSION: 1.0000 mL/kg/h | INTRAVENOUS | Status: DC

## 2017-08-18 MED ORDER — METHYLPREDNISOLONE SODIUM SUCC 125 MG IJ SOLR: 125.0000 mg | Freq: Once | INTRAMUSCULAR | Status: DC

## 2017-08-18 NOTE — ED Notes (Signed)
Pt ambulates to bathroom with steady gait

## 2017-08-18 NOTE — ED Notes (Signed)
C/o nausea and increase of pain from 0 to 3.  Increased nitro drip to 38mcg/min at this time.  To continue to monitor.

## 2017-08-18 NOTE — Plan of Care (Signed)
  Problem: Education: Goal: Knowledge of General Education information will improve Outcome: Progressing   Problem: Coping: Goal: Level of anxiety will decrease Outcome: Progressing   

## 2017-08-18 NOTE — Progress Notes (Signed)
ANTICOAGULATION CONSULT NOTE - Follow Up Consult  Pharmacy Consult for IV heparin Indication: chest pain/ACS  Allergies  Allergen Reactions  . Ivp Dye [Iodinated Diagnostic Agents] Swelling    "skin came off"  . Codeine Itching  . Other     NO PLASTIC TAPE    Patient Measurements: Height: 5\' 9"  (175.3 cm) Weight: (!) 328 lb 4.2 oz (148.9 kg) IBW/kg (Calculated) : 66.2 Heparin Dosing Weight: 102.6 kg  Vital Signs: Temp: 98.3 F (36.8 C) (07/15 1935) Temp Source: Oral (07/15 1935) BP: 141/84 (07/15 1935) Pulse Rate: 85 (07/15 1935)  Labs: Recent Labs    08/17/17 2215 08/17/17 2345 08/18/17 0723 08/18/17 1139 08/18/17 1155 08/18/17 1852  HGB 12.3  --   --   --   --   --   HCT 39.0  --   --   --   --   --   PLT 306  --   --   --   --   --   HEPARINUNFRC  --   --   --  0.54  --  0.53  CREATININE 1.14*  --   --   --   --   --   TROPONINI  --  0.05* 0.07*  --  0.07*  --     Estimated Creatinine Clearance: 85.4 mL/min (A) (by C-G formula based on SCr of 1.14 mg/dL (H)).   Medical History: Past Medical History:  Diagnosis Date  . ALLERGIC RHINITIS   . 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 Asthma    Questionable  . Diabetes mellitus, type 2 ([place])   . History of tobacco abuse    Quit 2011  . Hypertension   . Hypertriglyceridemia   . Hypothyroidism   . NSTEMI (non-ST elevated myocardial infarction) ([place]) 09/30/11   NO CAD by cath, ? coronary vasospasm  . Obesity     Medications:  Scheduled:  . aspirin EC  81 mg Oral Daily  . [START ON 08/19/2017] diphenhydrAMINE  25 mg Intravenous Once  . glipiZIDE  5 mg Oral BID AC  . insulin aspart  0-20 Units Subcutaneous TID WC  . [START ON 08/19/2017] methylPREDNISolone sodium succinate  125 mg Intravenous Once  . sodium chloride flush  3 mL Intravenous Q12H    Assessment: 57 yo female on IV heparin at 1750 units/hr.  Pharmacy asked to assist with dosing.  Planning either cath lab or stopping IV heparin if troponins stay flat.    Repeat HL remains therapeutic at 0.53. No bleeding per RN.   Goal of Therapy:  Heparin level 0.3-0.7 units/ml Monitor platelets by anticoagulation protocol: Yes   Plan:  1. Continue IV heparin at current rate. 2. Daily heparin level and CBC.  Albertina Parr, PharmD., BCPS Clinical Pharmacist Clinical phone for 08/18/17 until 3:30pm: [phone] If after 3:30pm, please refer to [facility] for unit-specific pharmacist

## 2017-08-18 NOTE — Progress Notes (Signed)
Inpatient Diabetes Program Recommendations  AACE/ADA: New Consensus Statement on Inpatient Glycemic Control (2015)  Target Ranges:  Prepandial:   less than 140 mg/dL      Peak postprandial:   less than 180 mg/dL (1-2 hours)      Critically ill patients:  140 - 180 mg/dL   Lab Results  Component Value Date   GLUCAP 378 (H) 10/03/2011   HGBA1C 11.5 (H) 09/29/2011   Review of Glycemic Control  Diabetes history: DM 2 Outpatient Diabetes medications: Glipizide 5 mg BID, Victoza 1.8 mg Daily, 70/30 12-16 units tid Current orders for Inpatient glycemic control: Glipizide 5 mg BID, Victoza 1.8 mg Daily  Inpatient Diabetes Program Recommendations:    Patient with DM 2 and is also currently NPO. Patient takes insulin at home. Please d/c oral meds while in the hospital. Please order CBGs and Novolog 0-15 units tid + Novolog HS scale 0-5 units to start.  Thanks,  Tama Headings RN, MSN, BC-ADM, [facility] Inpatient Diabetes Coordinator Team Pager [phone] (8a-5p)

## 2017-08-18 NOTE — ED Notes (Signed)
Assisted to ambulate to the bathroom.  Placed on hospital bed for comfort.  Bed in lowest position with call bell within reach.  Encouraged to call for assistance as needed.

## 2017-08-18 NOTE — Progress Notes (Signed)
ANTICOAGULATION CONSULT NOTE - Initial Consult  Pharmacy Consult for IV heparin Indication: chest pain/ACS  Allergies  Allergen Reactions  . Ivp Dye [Iodinated Diagnostic Agents] Swelling    "skin came off"  . Codeine Itching  . Other     NO PLASTIC TAPE    Patient Measurements: Height: 5\' 9"  (175.3 cm) Weight: (!) 328 lb 4.2 oz (148.9 kg) IBW/kg (Calculated) : 66.2 Heparin Dosing Weight: 102.6 kg  Vital Signs: Temp: 98.4 F (36.9 C) (07/15 1230) Temp Source: Oral (07/15 1230) BP: 158/64 (07/15 1230) Pulse Rate: 63 (07/15 1230)  Labs: Recent Labs    08/17/17 2215 08/17/17 2345 08/18/17 0723 08/18/17 1139  HGB 12.3  --   --   --   HCT 39.0  --   --   --   PLT 306  --   --   --   HEPARINUNFRC  --   --   --  0.54  CREATININE 1.14*  --   --   --   TROPONINI  --  0.05* 0.07*  --     Estimated Creatinine Clearance: 85.4 mL/min (A) (by C-G formula based on SCr of 1.14 mg/dL (H)).   Medical History: Past Medical History:  Diagnosis Date  . ALLERGIC RHINITIS   . 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 Asthma    Questionable  . Diabetes mellitus, type 2 ([place])   . History of tobacco abuse    Quit 2011  . Hypertension   . Hypertriglyceridemia   . Hypothyroidism   . NSTEMI (non-ST elevated myocardial infarction) ([place]) 09/30/11   NO CAD by cath, ? coronary vasospasm  . Obesity     Medications:  Scheduled:  . aspirin EC  81 mg Oral Daily  . diphenhydrAMINE  25 mg Intravenous Once  . glipiZIDE  5 mg Oral BID AC  . liraglutide  1.8 mg Subcutaneous QHS  . methylPREDNISolone sodium succinate  125 mg Intravenous Once  . sodium chloride flush  3 mL Intravenous Q12H    Assessment: 57 yo female on IV heparin at 1750 units/hr.  Pharmacy asked to assist with dosing.  Planning either cath lab or stopping IV heparin if troponins stay flat.   Heparin level therapeutic currently.  Goal of Therapy:  Heparin level 0.3-0.7 units/ml Monitor platelets by anticoagulation protocol: Yes    Plan:  1. Continue IV heparin at current rate. 2. Confirm heparin level in 6 hrs. 3. Daily heparin level and CBC.  Marguerite Olea, [facility] Clinical Pharmacist Phone [phone]  08/18/2017 1:12 PM

## 2017-08-18 NOTE — H&P (Signed)
[name] is an 57 y.o. female.   Chief Complaint: Chest pain HPI: [name]  is a 57 y.o. female  With hypertension, hyperlipidemia probably related to uncontrolled diabetes and poor eating habits, family history of premature coronary artery disease, mother having had coronary stents at age 57, presented to the emergency room complaining of chest pain that started while she was watching TV, took 3 sublingual nitroglycerin without relief.  She felt the chest pain as pressure-like sensation in the middle of the chest associated with nausea.  She also presented similarly 3 days ago to the emergency room where the troponins were negative and she was discharged home.  By the time she came to the emergency room she had normal EKG and chest pain had resolved.  However the troponins were positive and she was admitted for further observation.  She was started on IV heparin and nitroglycerin, she has had on and off chest discomfort last night but states that it is very mild.  Patient has been having leg pain and has received steroid shots to her ankle, she is also had back pain and neck pain and has received steroid shot recently as well.  She has GERD but denies any other associated symptoms, states that symptoms are well controlled with PPI.  Past Medical History:  Diagnosis Date  . ALLERGIC RHINITIS   . 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 Asthma    Questionable  . Diabetes mellitus, type 2 ([place])   . History of tobacco abuse    Quit 2011  . Hypertension   . Hypertriglyceridemia   . Hypothyroidism   . NSTEMI (non-ST elevated myocardial infarction) ([place]) 09/30/11   NO CAD by cath, ? coronary vasospasm  . Obesity     Past Surgical History:  Procedure Laterality Date  . LEFT HEART CATHETERIZATION WITH CORONARY ANGIOGRAM N/A 09/30/2011   Procedure: LEFT HEART CATHETERIZATION WITH CORONARY ANGIOGRAM;  Surgeon: Burnell Blanks, MD;  Location: [facility] CATH LAB;  Service: Cardiovascular;  Laterality: N/A;   . TUBAL LIGATION    . VESICOVAGINAL FISTULA CLOSURE W/ TAH      Family History  Problem Relation Age of Onset  . Heart disease Mother        has pacemaker  . Diabetes Mother   . Diabetes Unknown        strong family history  . Cancer Maternal Uncle    Social History:  reports that she quit smoking about 7 years ago. Her smoking use included cigarettes. She has a 28.80 pack-year smoking history. She has never used smokeless tobacco. She reports that she drinks alcohol. She reports that she does not use drugs.  Allergies:  Allergies  Allergen Reactions  . Ivp Dye [Iodinated Diagnostic Agents] Swelling    "skin came off"  . Codeine Itching  . Other     NO PLASTIC TAPE   Review of Systems  Constitutional: Negative.   HENT: Negative.   Eyes: Negative.   Respiratory: Negative.   Cardiovascular: Positive for chest pain. Negative for palpitations, orthopnea, claudication, leg swelling and PND.  Gastrointestinal: Positive for heartburn and nausea. Negative for abdominal pain, blood in stool and diarrhea.  Genitourinary: Negative.   Musculoskeletal: Positive for back pain.  Skin: Negative.   Neurological: Negative.   Endo/Heme/Allergies: Negative.   Psychiatric/Behavioral: Negative.   All other systems reviewed and are negative.   Blood pressure 131/79, pulse 65, temperature 98.1 F (36.7 C), temperature source Oral, resp. rate 15, height '5\' 9"'$  (1.753  m), weight (!) 143.8 kg (317 lb), SpO2 94 %. Body mass index is 46.81 kg/m.  Physical Exam  Constitutional: She is oriented to person, place, and time and well-developed, well-nourished, and in no distress.  Morbidly obese  HENT:  Head: Atraumatic.  Eyes: Conjunctivae are normal.  Neck: Normal range of motion. Neck supple. No JVD present. No thyromegaly present.  Cardiovascular: Normal rate, regular rhythm, normal heart sounds and intact distal pulses. Exam reveals no gallop.  No murmur heard. Pulmonary/Chest: Effort normal  and breath sounds normal.  Abdominal: Soft. Bowel sounds are normal.  Pannus present  Musculoskeletal: Normal range of motion. She exhibits no edema or tenderness.  Neurological: She is alert and oriented to person, place, and time.  Skin: Skin is warm and dry.  Psychiatric: Affect normal.    Results for orders placed or performed during the hospital encounter of 08/17/17 (from the past 48 hour(s))  Basic metabolic panel     Status: Abnormal   Collection Time: 08/17/17 10:15 PM  Result Value Ref Range   Sodium 138 135 - 145 mmol/L   Potassium 3.7 3.5 - 5.1 mmol/L   Chloride 107 98 - 111 mmol/L    Comment: Please note change in reference range.   CO2 19 (L) 22 - 32 mmol/L   Glucose, Bld 263 (H) 70 - 99 mg/dL    Comment: Please note change in reference range.   BUN 17 6 - 20 mg/dL    Comment: Please note change in reference range.   Creatinine, Ser 1.14 (H) 0.44 - 1.00 mg/dL   Calcium 8.9 8.9 - 10.3 mg/dL   GFR calc non Af Amer 52 (L) >60 mL/min   GFR calc Af Amer >60 >60 mL/min    Comment: (NOTE) The eGFR has been calculated using the CKD EPI equation. This calculation has not been validated in all clinical situations. eGFR's persistently <60 mL/min signify possible Chronic Kidney Disease.    Anion gap 12 5 - 15    Comment: Performed at [facility] [address] [place], [place] [address]  CBC     Status: Abnormal   Collection Time: 08/17/17 10:15 PM  Result Value Ref Range   WBC 13.8 (H) 4.0 - 10.5 K/uL   RBC 4.27 3.87 - 5.11 MIL/uL   Hemoglobin 12.3 12.0 - 15.0 g/dL   HCT 39.0 36.0 - 46.0 %   MCV 91.3 78.0 - 100.0 fL   MCH 28.8 26.0 - 34.0 pg   MCHC 31.5 30.0 - 36.0 g/dL   RDW 13.2 11.5 - 15.5 %   Platelets 306 150 - 400 K/uL    Comment: Performed at [facility] [address], [place], [place] [address]  I-stat troponin, ED     Status: Abnormal   Collection Time: 08/17/17 11:03 PM  Result Value Ref Range   Troponin i, poc 0.10 (HH) 0.00 - 0.08  ng/mL   Comment NOTIFIED PHYSICIAN    Comment 3            Comment: Due to the release kinetics of cTnI, a negative result within the first hours of the onset of symptoms does not rule out myocardial infarction with certainty. If myocardial infarction is still suspected, repeat the test at appropriate intervals.   I-Stat beta hCG blood, ED     Status: None   Collection Time: 08/17/17 11:03 PM  Result Value Ref Range   I-stat hCG, quantitative <5.0 <5 mIU/mL   Comment 3  Comment:   GEST. AGE      CONC.  (mIU/mL)   <=1 WEEK        5 - 50     2 WEEKS       50 - 500     3 WEEKS       100 - 10,000     4 WEEKS     1,000 - 30,000        FEMALE AND NON-PREGNANT FEMALE:     LESS THAN 5 mIU/mL   Troponin I     Status: Abnormal   Collection Time: 08/17/17 11:45 PM  Result Value Ref Range   Troponin I 0.05 (HH) <0.03 ng/mL    Comment: CRITICAL RESULT CALLED TO, READ BACK BY AND VERIFIED WITH: BERNADO M,RN 08/18/17 0029 WAYK Performed at [facility], [place] [address] [place], [place] [address]   Troponin I     Status: Abnormal   Collection Time: 08/18/17  7:23 AM  Result Value Ref Range   Troponin I 0.07 (HH) <0.03 ng/mL    Comment: CRITICAL VALUE NOTED.  VALUE IS CONSISTENT WITH PREVIOUSLY REPORTED AND CALLED VALUE. Performed at [facility], [place] [address], [place], [place] [address]     Labs:   Lab Results  Component Value Date   WBC 13.8 (H) 08/17/2017   HGB 12.3 08/17/2017   HCT 39.0 08/17/2017   MCV 91.3 08/17/2017   PLT 306 08/17/2017   Recent Labs  Lab 08/17/17 2215  NA 138  K 3.7  CL 107  CO2 19*  BUN 17  CREATININE 1.14*  CALCIUM 8.9  GLUCOSE 263*    Lipid Panel     Component Value Date/Time   CHOL 181 09/30/2011 0300   TRIG 585 (H) 09/30/2011 0300   HDL 44 09/30/2011 0300   CHOLHDL 4.1 09/30/2011 0300   VLDL UNABLE TO CALCULATE IF TRIGLYCERIDE OVER 400 mg/dL 09/30/2011 0300   LDLCALC UNABLE TO CALCULATE IF TRIGLYCERIDE OVER 400  mg/dL 09/30/2011 0300  HEMOGLOBIN A1C Lab Results  Component Value Date   HGBA1C 11.5 (H) 09/29/2011   MPG 283 (H) 09/29/2011    Cardiac Panel (last 3 results) Recent Labs    08/17/17 2345 08/18/17 0723  TROPONINI 0.05* 0.07*    Current Facility-Administered Medications:  .  aspirin EC tablet 81 mg, 81 mg, Oral, Daily, [name], MD .  glipiZIDE (GLUCOTROL) tablet 5 mg, 5 mg, Oral, BID AC, [name], MD .  heparin ADULT infusion 100 units/mL (25000 units/287m sodium chloride 0.45%), 1,750 Units/hr, Intravenous, Continuous, Joy, Shawn C, PA-C, Last Rate: 17.5 mL/hr at 08/18/17 0039, 1,750 Units/hr at 08/18/17 0039 .  liraglutide (VICTOZA) SOPN 1.8 mg, 1.8 mg, Subcutaneous, QHS, GAdrian Prows MD .  nitroGLYCERIN (NITROSTAT) SL tablet 0.4 mg, 0.4 mg, Sublingual, Q5 min PRN, GAdrian Prows MD .  nitroGLYCERIN 50 mg in dextrose 5 % 250 mL (0.2 mg/mL) infusion, 0-20 mcg/min, Intravenous, Titrated, Joy, Shawn C, PA-C, Last Rate: 4.5 mL/hr at 08/18/17 0728, 15 mcg/min at 08/18/17 01829 Current Outpatient Medications:  .  aspirin EC 81 MG tablet, Take 81 mg by mouth daily., Disp: , Rfl:  .  DUEXIS 800-26.6 MG TABS, Take 1 tablet by mouth 3 (three) times daily., Disp: , Rfl: 3 .  glipiZIDE (GLUCOTROL) 5 MG tablet, Take 5 mg by mouth 2 (two) times daily before a meal., Disp: , Rfl:  .  isosorbide mononitrate (IMDUR) 30 MG 24 hr tablet, Take 1 tablet (30  mg total) by mouth daily., Disp: 30 tablet, Rfl: 6 .  levothyroxine (SYNTHROID, LEVOTHROID) 300 MCG tablet, Take 300 mcg by mouth daily., Disp: , Rfl: 4 .  liraglutide (VICTOZA) 18 MG/3ML SOPN, Inject 1.8 mg into the skin at bedtime., Disp: , Rfl:  .  nitroGLYCERIN (NITROSTAT) 0.4 MG SL tablet, Place 1 tablet (0.4 mg total) under the tongue every 5 (five) minutes as needed for chest pain (up to 3 doses)., Disp: 25 tablet, Rfl: 3 .  NOVOLOG MIX 70/30 FLEXPEN (70-30) 100 UNIT/ML FlexPen, Inject 12-16 Units into the skin 3 (three) times daily with  meals. Sliding scale, Disp: , Rfl: 2 .  potassium chloride SA (K-DUR,KLOR-CON) 20 MEQ tablet, Take 20 mEq by mouth as needed (muscle aches/cramps). , Disp: , Rfl:  .  triamterene-hydrochlorothiazide (MAXZIDE) 75-50 MG per tablet, Take 0.5 tablets by mouth daily., Disp: 30 tablet, Rfl: 1 .  etodolac (LODINE) 400 MG tablet, Take 1 tablet (400 mg total) by mouth 2 (two) times daily. (Patient not taking: Reported on 08/18/2017), Disp: 60 tablet, Rfl: 5 .  lisinopril (PRINIVIL) 10 MG tablet, Take 1 tablet (10 mg total) by mouth daily. (Patient not taking: Reported on 08/18/2017), Disp: 30 tablet, Rfl: 1  CARDIAC STUDIES:  EKG 08/17/2017: Normal sinus rhythm.  No evidence of ischemia.  Coronary angiogram 09/30/2011: Normal coronary arteries.  Normal LV systolic function.  ECHO 09/27/2011: Normal LV systolic function, poor echo window, mild diastolic dysfunction.  No significant valvular abnormality.  Assessment/Plan 1.  Unstable angina, with mildly positive serum troponin. 2.  Normal coronary arteries when she presented with non-STEMI in 2013, coronary spasm suspected. 3.  Diabetes mellitus type 2 uncontrolled but 4.  Mixed hyperlipidemia 5.  Hypertension 6.  Family history of premature coronary artery disease, mother had coronary stent at the age of 55 years. 7.  Morbid obesity  Recommendation: With normal EKG, chest pain, again mildly positive serum troponin, symptoms occurring at rest may suggest coronary spasm.  However she has ongoing and continued risk factors including uncontrolled diabetes and morbid obesity, will schedule for repeat cardiac catheterization.  Chest pain was nitroglycerin responsive.  I will recheck lipids and A1c, we have discussed regarding risk modification and weight loss.  Discussed risks, benefits and alternatives of angiogram including but not limited to <1% risk of death, stroke, MI, need for urgent surgical revascularization, renal failure, but not limited to thest.  patient is willing to proceed.   [name], MD 08/18/2017, 8:49 AM [facility]. [place] Pager: [phone] Office: [phone] If no answer: Cell:  [phone]

## 2017-08-19 ENCOUNTER — Other Ambulatory Visit: Payer: Self-pay

## 2017-08-19 ENCOUNTER — Encounter (HOSPITAL_COMMUNITY)
Admission: EM | Disposition: A | Discharge status: Home / Self Care | Payer: Self-pay | Source: Home / Self Care | Attending: Cardiology

## 2017-08-19 ENCOUNTER — Encounter (HOSPITAL_COMMUNITY): Payer: Self-pay | Admitting: Cardiology

## 2017-08-19 HISTORY — PX: LEFT HEART CATH AND CORONARY ANGIOGRAPHY: CATH118249

## 2017-08-19 HISTORY — PX: CORONARY STENT INTERVENTION: CATH118234

## 2017-08-19 LAB — GLUCOSE, CAPILLARY
GLUCOSE-CAPILLARY: 134 mg/dL — AB (ref 70–99)
GLUCOSE-CAPILLARY: 244 mg/dL — AB (ref 70–99)
Glucose-Capillary: 213 mg/dL — ABNORMAL HIGH (ref 70–99)
Glucose-Capillary: 419 mg/dL — ABNORMAL HIGH (ref 70–99)

## 2017-08-19 LAB — CBC
HCT: 36.3 % (ref 36.0–46.0)
HEMOGLOBIN: 11.4 g/dL — AB (ref 12.0–15.0)
MCH: 28.5 pg (ref 26.0–34.0)
MCHC: 31.4 g/dL (ref 30.0–36.0)
MCV: 90.8 fL (ref 78.0–100.0)
PLATELETS: 282 10*3/uL (ref 150–400)
RBC: 4 MIL/uL (ref 3.87–5.11)
RDW: 13.2 % (ref 11.5–15.5)
WBC: 12.8 10*3/uL — ABNORMAL HIGH (ref 4.0–10.5)

## 2017-08-19 LAB — POCT ACTIVATED CLOTTING TIME: ACTIVATED CLOTTING TIME: 235 s

## 2017-08-19 LAB — HEPARIN LEVEL (UNFRACTIONATED): Heparin Unfractionated: 0.64 IU/mL (ref 0.30–0.70)

## 2017-08-19 SURGERY — LEFT HEART CATH AND CORONARY ANGIOGRAPHY
Anesthesia: LOCAL

## 2017-08-19 MED ORDER — HEPARIN (PORCINE) IN NACL 1000-0.9 UT/500ML-% IV SOLN
INTRAVENOUS | Status: DC | PRN
  Administered 2017-08-19 (×2): 500 mL

## 2017-08-19 MED ORDER — LIDOCAINE HCL (PF) 1 % IJ SOLN
INTRAMUSCULAR | Status: DC | PRN
  Administered 2017-08-19: 2 mL

## 2017-08-19 MED ORDER — HEPARIN (PORCINE) IN NACL 1000-0.9 UT/500ML-% IV SOLN
INTRAVENOUS | Status: AC
  Filled 2017-08-19: qty 500

## 2017-08-19 MED ORDER — FENTANYL CITRATE (PF) 100 MCG/2ML IJ SOLN
INTRAMUSCULAR | Status: AC
  Filled 2017-08-19: qty 2

## 2017-08-19 MED ORDER — INSULIN ASPART 100 UNIT/ML ~~LOC~~ SOLN
0.0000 [IU] | Freq: Every day | SUBCUTANEOUS | Status: DC
  Administered 2017-08-19: 2 [IU] via SUBCUTANEOUS

## 2017-08-19 MED ORDER — SODIUM CHLORIDE 0.9 % IV SOLN: 250.0000 mL | INTRAVENOUS | Status: DC | PRN

## 2017-08-19 MED ORDER — HEART ATTACK BOUNCING BOOK
Freq: Once | Status: AC
  Administered 2017-08-19: 22:00:00
  Filled 2017-08-19: qty 1

## 2017-08-19 MED ORDER — TICAGRELOR 90 MG PO TABS
ORAL_TABLET | ORAL | Status: DC | PRN
  Administered 2017-08-19: 180 mg via ORAL

## 2017-08-19 MED ORDER — ACETAMINOPHEN 325 MG PO TABS: 650.0000 mg | ORAL_TABLET | ORAL | Status: DC | PRN

## 2017-08-19 MED ORDER — TICAGRELOR 90 MG PO TABS
90.0000 mg | ORAL_TABLET | Freq: Two times a day (BID) | ORAL | Status: DC
  Administered 2017-08-19 – 2017-08-20 (×2): 90 mg via ORAL
  Filled 2017-08-19 (×2): qty 1

## 2017-08-19 MED ORDER — LIDOCAINE HCL (PF) 1 % IJ SOLN
INTRAMUSCULAR | Status: AC
  Filled 2017-08-19: qty 30

## 2017-08-19 MED ORDER — HYDROMORPHONE HCL 1 MG/ML IJ SOLN
INTRAMUSCULAR | Status: DC | PRN
  Administered 2017-08-19: 0.5 mg via INTRAVENOUS

## 2017-08-19 MED ORDER — SODIUM CHLORIDE 0.9 % WEIGHT BASED INFUSION
1.0000 mL/kg/h | INTRAVENOUS | Status: AC
  Administered 2017-08-19: 1 mL/kg/h via INTRAVENOUS

## 2017-08-19 MED ORDER — TICAGRELOR 90 MG PO TABS
ORAL_TABLET | ORAL | Status: AC
  Filled 2017-08-19: qty 2

## 2017-08-19 MED ORDER — VERAPAMIL HCL 2.5 MG/ML IV SOLN
INTRAVENOUS | Status: AC
  Filled 2017-08-19: qty 2

## 2017-08-19 MED ORDER — MIDAZOLAM HCL 2 MG/2ML IJ SOLN
INTRAMUSCULAR | Status: DC | PRN
  Administered 2017-08-19: 2 mg via INTRAVENOUS

## 2017-08-19 MED ORDER — THE SENSUOUS HEART BOOK
Freq: Once | Status: AC
  Administered 2017-08-19: 22:00:00
  Filled 2017-08-19: qty 1

## 2017-08-19 MED ORDER — INSULIN ASPART 100 UNIT/ML ~~LOC~~ SOLN: 0.0000 [IU] | Freq: Three times a day (TID) | SUBCUTANEOUS | Status: DC

## 2017-08-19 MED ORDER — NITROGLYCERIN 1 MG/10 ML FOR IR/CATH LAB
INTRA_ARTERIAL | Status: AC
  Filled 2017-08-19: qty 10

## 2017-08-19 MED ORDER — SODIUM CHLORIDE 0.9% FLUSH: 3.0000 mL | INTRAVENOUS | Status: DC | PRN

## 2017-08-19 MED ORDER — ONDANSETRON HCL 4 MG/2ML IJ SOLN: 4.0000 mg | Freq: Four times a day (QID) | INTRAMUSCULAR | Status: DC | PRN

## 2017-08-19 MED ORDER — HEPARIN SODIUM (PORCINE) 1000 UNIT/ML IJ SOLN
INTRAMUSCULAR | Status: AC
  Filled 2017-08-19: qty 1

## 2017-08-19 MED ORDER — HEPARIN SODIUM (PORCINE) 1000 UNIT/ML IJ SOLN
INTRAMUSCULAR | Status: DC | PRN
  Administered 2017-08-19: 3000 [IU] via INTRAVENOUS
  Administered 2017-08-19: 5000 [IU] via INTRAVENOUS
  Administered 2017-08-19: 4000 [IU] via INTRAVENOUS

## 2017-08-19 MED ORDER — NITROGLYCERIN 1 MG/10 ML FOR IR/CATH LAB
INTRA_ARTERIAL | Status: DC | PRN
  Administered 2017-08-19 (×2): 200 ug via INTRACORONARY

## 2017-08-19 MED ORDER — VERAPAMIL HCL 2.5 MG/ML IV SOLN
INTRA_ARTERIAL | Status: DC | PRN
  Administered 2017-08-19: 7 mL via INTRA_ARTERIAL

## 2017-08-19 MED ORDER — MIDAZOLAM HCL 2 MG/2ML IJ SOLN
INTRAMUSCULAR | Status: AC
  Filled 2017-08-19: qty 2

## 2017-08-19 MED ORDER — ANGIOPLASTY BOOK
Freq: Once | Status: AC
  Administered 2017-08-19: 22:00:00
  Filled 2017-08-19: qty 1

## 2017-08-19 MED ORDER — HYDROMORPHONE HCL 1 MG/ML IJ SOLN
INTRAMUSCULAR | Status: AC
  Filled 2017-08-19: qty 0.5

## 2017-08-19 MED ORDER — IOHEXOL 350 MG/ML SOLN
INTRAVENOUS | Status: DC | PRN
  Administered 2017-08-19: 130 mL via INTRA_ARTERIAL

## 2017-08-19 MED ORDER — SODIUM CHLORIDE 0.9% FLUSH
3.0000 mL | Freq: Two times a day (BID) | INTRAVENOUS | Status: DC
  Administered 2017-08-19: 3 mL via INTRAVENOUS

## 2017-08-19 SURGICAL SUPPLY — 15 items
BALLN SAPPHIRE 2.25X10 (BALLOONS) ×2
BALLOON SAPPHIRE 2.25X10 (BALLOONS) ×1 IMPLANT
CATH LAUNCHER 6FR JR4 (CATHETERS) ×2 IMPLANT
CATH OPTITORQUE TIG 4.0 5F (CATHETERS) ×2 IMPLANT
DEVICE RAD COMP TR BAND LRG (VASCULAR PRODUCTS) ×2 IMPLANT
GLIDESHEATH SLEND A-KIT 6F 20G (SHEATH) ×2 IMPLANT
GUIDEWIRE INQWIRE 1.5J.035X260 (WIRE) ×1 IMPLANT
INQWIRE 1.5J .035X260CM (WIRE) ×2
KIT ENCORE 26 ADVANTAGE (KITS) ×2 IMPLANT
KIT HEART LEFT (KITS) ×2 IMPLANT
PACK CARDIAC CATHETERIZATION (CUSTOM PROCEDURE TRAY) ×2 IMPLANT
STENT SYNERGY DES 3X20 (Permanent Stent) ×2 IMPLANT
TRANSDUCER W/STOPCOCK (MISCELLANEOUS) ×2 IMPLANT
TUBING CIL FLEX 10 FLL-RA (TUBING) ×2 IMPLANT
WIRE COUGAR XT STRL 190CM (WIRE) ×2 IMPLANT

## 2017-08-19 NOTE — Progress Notes (Addendum)
Patient states she will have her daughter send NCM the correct insurance information because benefit check states insurance is terminated.

## 2017-08-19 NOTE — Interval H&P Note (Signed)
History and Physical Interval Note:  08/19/2017 10:25 AM  [name]  has presented today for surgery, with the diagnosis of cp  The various methods of treatment have been discussed with the patient and family. After consideration of risks, benefits and other options for treatment, the patient has consented to  Procedure(s): LEFT HEART CATH AND CORONARY ANGIOGRAPHY (N/A) and possible angioplasty as a surgical intervention .  The patient's history has been reviewed, patient examined, no change in status, stable for surgery.  I have reviewed the patient's chart and labs.  Questions were answered to the patient's satisfaction.     [name]

## 2017-08-19 NOTE — Progress Notes (Addendum)
Inpatient Diabetes Program Recommendations  AACE/ADA: New Consensus Statement on Inpatient Glycemic Control (2015)  Target Ranges:  Prepandial:   less than 140 mg/dL      Peak postprandial:   less than 180 mg/dL (1-2 hours)      Critically ill patients:  140 - 180 mg/dL   Results for DAPHINE, LOCH (MRN [id number]) as of 08/19/2017 14:39  Ref. Range 08/19/2017 08:26 08/19/2017 11:50  Glucose-Capillary Latest Ref Range: 70 - 99 mg/dL 134 (H) 213 (H)   Results for HAYZLEE, MCSORLEY (MRN [id number]) as of 08/19/2017 14:39  Ref. Range 08/18/2017 09:02  Hemoglobin A1C Latest Ref Range: 4.8 - 5.6 % 9.4 (H)  Average glucose 223 mg/dl    Admit with: CP  History: DM  Home DM Meds: Novolog 70/30 Insulin 12-16 units TID per SSI       Victoza 1.8 mg QHS       Glipizide 5 mg BID  Current Insulin Orders: Novolog Resistant Correction Scale/ SSI (0-20 units) TID AC         Glipizide 5 mg BID       MD- Please consider starting 70/30 Insulin tonight with supper  70/30 Insulin- 12 units BID with meals    Addendum 3:30pm- Met with pt this afternoon post-cath.  Spoke with patient about her current A1c of 9.4%.  Explained what an A1c is and what it measures.  Reminded patient that her goal A1c is 7% or less per ADA standards to prevent both acute and long-term complications.  Explained to patient the extreme importance of good glucose control at home.  Reviewed CBG goals for home as well.  Encouraged patient to record all CBGs in a logbook for her PCP to review.  Patient told me she checks her CBGs 5-6 times per day.  Sees Dr. Vista Lawman for Primary Care.  Takes the 70/30 Insulin (usually 12-14 units) about two times per day with meals.  Doesn't always eat three times per day.  Daughter is working with pt to make major nutritional changes at home to better control CBGs.  Does not miss doses of medicines.  Has not been exercising b/c of CP and hip/LE pain.  Stated her back and hip are feeling better and  plans to begin chair exercises when able.  Wants to try to do better with glucose control at home.  Encouraged patient to avoid beverages with sugar (regular soda, sweet tea, lemonade, fruit juice) and to consume mostly water.  Discussed what foods contain carbohydrates and how carbohydrates affect the body's blood sugar levels.  Encouraged patient to be careful with her portion sizes (especially grains, starchy vegetables, and fruits).       --Will follow patient during hospitalization--  Wyn Quaker RN, MSN, CDE Diabetes Coordinator Inpatient Glycemic Control Team Team Pager: [phone] (8a-5p)

## 2017-08-19 NOTE — Care Management Note (Addendum)
Case Management Note  Patient Details  Name: [name] MRN: [id number] Date of Birth: 01-04-1961  Subjective/Objective:  From home, s/p stent intervention will be on brilinta, benefit check in process. RN will give patient the 30 day savings coupon and the 5.0 co pay coupon.      7/17 [name] RN, BSN - NCM informed patient that she has zero co pay for brilinta.  She will be going to [facility] on [address] will not have enough brilinta to fill script, MD will need to give patient a printed script, she will need to go to another local pharmacy, she states she will go to [facility] on [address] they do have 50 pills in stock.                    Action/Plan: DC home when ready.  Expected Discharge Date:                  Expected Discharge Plan:  Home/Self Care  In-House Referral:     Discharge planning Services  CM Consult, Medication Assistance  Post Acute Care Choice:    Choice offered to:     DME Arranged:    DME Agency:     HH Arranged:    HH Agency:     Status of Service:  Completed, signed off  If discussed at [employer] of Stay Meetings, dates discussed:    Additional Comments:  Zenon Mayo, RN 08/19/2017, 1:33 PM

## 2017-08-19 NOTE — Progress Notes (Signed)
ANTICOAGULATION CONSULT NOTE - Follow Up Consult  Pharmacy Consult for IV heparin Indication: chest pain/ACS  Allergies  Allergen Reactions  . Ivp Dye [Iodinated Diagnostic Agents] Swelling    "skin came off"  . Codeine Itching  . Other     NO PLASTIC TAPE    Patient Measurements: Height: 5\' 9"  (175.3 cm) Weight: (!) 328 lb 4.2 oz (148.9 kg) IBW/kg (Calculated) : 66.2 Heparin Dosing Weight: 102.6 kg  Vital Signs: Temp: 98 F (36.7 C) (07/16 0740) Temp Source: Oral (07/16 0740) BP: 107/59 (07/16 0740) Pulse Rate: 58 (07/16 0740)  Labs: Recent Labs    08/17/17 2215  08/18/17 0723 08/18/17 1139 08/18/17 1155 08/18/17 1852 08/19/17 0432 08/19/17 0758  HGB 12.3  --   --   --   --   --  11.4*  --   HCT 39.0  --   --   --   --   --  36.3  --   PLT 306  --   --   --   --   --  282  --   HEPARINUNFRC  --   --   --  0.54  --  0.53  --  0.64  CREATININE 1.14*  --   --   --   --   --   --   --   TROPONINI  --    < > 0.07*  --  0.07* 0.08*  --   --    < > = values in this interval not displayed.    Estimated Creatinine Clearance: 85.4 mL/min (A) (by C-G formula based on SCr of 1.14 mg/dL (H)).   Medical History: Past Medical History:  Diagnosis Date  . ALLERGIC RHINITIS   . 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 Asthma    Questionable  . Diabetes mellitus, type 2 ([place])   . History of tobacco abuse    Quit 2011  . Hypertension   . Hypertriglyceridemia   . Hypothyroidism   . NSTEMI (non-ST elevated myocardial infarction) ([place]) 09/30/11   NO CAD by cath, ? coronary vasospasm  . Obesity     Medications:  Scheduled:  . aspirin EC  81 mg Oral Daily  . glipiZIDE  5 mg Oral BID AC  . insulin aspart  0-20 Units Subcutaneous TID WC  . sodium chloride flush  3 mL Intravenous Q12H    Assessment: 58 yo female on IV heparin at 1750 units/hr.  Pharmacy asked to assist with dosing.  Planning either cath lab or stopping IV heparin if troponins stay flat. Heparin level remains therapeutic  this morning, CBC relatively stable.  Goal of Therapy:  Heparin level 0.3-0.7 units/ml Monitor platelets by anticoagulation protocol: Yes   Plan:  1. Continue IV heparin at 1750 units/hr 2. Daily heparin level and CBC. 3. Follow-up plans for cath  Arrie Senate, PharmD, BCPS Clinical Pharmacist [phone] Please check AMION for all [place] numbers 08/19/2017

## 2017-08-19 NOTE — Plan of Care (Signed)
Continue current care plan 

## 2017-08-19 NOTE — Discharge Instructions (Signed)
Fingerstick glucose (sugar) goals for home: Before meals: 80-130 mg/dl 2-Hours after meals: less than 180 mg/dl Hemoglobin A1c goal: 7% or less

## 2017-08-19 NOTE — Progress Notes (Signed)
TR BAND REMOVAL  LOCATION:  right radial  DEFLATED PER PROTOCOL:  Yes.    TIME BAND OFF / DRESSING APPLIED:   1530   SITE UPON ARRIVAL:   Level 0  SITE AFTER BAND REMOVAL:  Level 0  CIRCULATION SENSATION AND MOVEMENT:  Within Normal Limits  Yes.    COMMENTS:    

## 2017-08-20 LAB — BASIC METABOLIC PANEL
ANION GAP: 8 (ref 5–15)
BUN: 14 mg/dL (ref 6–20)
CALCIUM: 8.8 mg/dL — AB (ref 8.9–10.3)
CHLORIDE: 111 mmol/L (ref 98–111)
CO2: 21 mmol/L — AB (ref 22–32)
Creatinine, Ser: 0.98 mg/dL (ref 0.44–1.00)
GFR calc Af Amer: >60 mL/min (ref >60)
GFR calc non Af Amer: >60 mL/min (ref >60)
Glucose, Bld: 238 mg/dL — ABNORMAL HIGH (ref 70–99)
Potassium: 3.5 mmol/L (ref 3.5–5.1)
Sodium: 140 mmol/L (ref 135–145)

## 2017-08-20 LAB — CBC
HEMATOCRIT: 37.1 % (ref 36.0–46.0)
Hemoglobin: 11.9 g/dL — ABNORMAL LOW (ref 12.0–15.0)
MCH: 28.5 pg (ref 26.0–34.0)
MCHC: 32.1 g/dL (ref 30.0–36.0)
MCV: 89 fL (ref 78.0–100.0)
Platelets: 323 10*3/uL (ref 150–400)
RBC: 4.17 MIL/uL (ref 3.87–5.11)
RDW: 12.8 % (ref 11.5–15.5)
WBC: 15.5 10*3/uL — AB (ref 4.0–10.5)

## 2017-08-20 LAB — GLUCOSE, CAPILLARY: GLUCOSE-CAPILLARY: 204 mg/dL — AB (ref 70–99)

## 2017-08-20 MED ORDER — METOPROLOL TARTRATE 25 MG PO TABS: 12.5000 mg | ORAL_TABLET | Freq: Two times a day (BID) | ORAL | 11 refills | Status: DC

## 2017-08-20 MED ORDER — BISACODYL 10 MG RE SUPP: 10.0000 mg | RECTAL | 0 refills | Status: DC | PRN

## 2017-08-20 MED ORDER — ACETAMINOPHEN 325 MG PO TABS: 650.0000 mg | ORAL_TABLET | ORAL | 3 refills | Status: DC | PRN

## 2017-08-20 MED ORDER — TICAGRELOR 90 MG PO TABS: 90.0000 mg | ORAL_TABLET | Freq: Two times a day (BID) | ORAL | 3 refills | Status: DC

## 2017-08-20 NOTE — Discharge Summary (Signed)
Physician Discharge Summary  Patient ID: [name] MRN: [id number] DOB/AGE: 10-14-1960 57 y.o.  Admit date: 08/17/2017 Discharge date: 08/20/2017  Admission Diagnoses: NSTEMI Hypertension Type 2 DM Morbid obesity  Discharge Diagnoses:  57 year old African-American female with hypertension, type II diabetes mellitus, hyperlipidemia, morbid obesity, tobacco abuse, admitted to the hospital with complains of chest pain.  Workup showed multiple pulmonary evaluation suggestive of non-STEMI.  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  She was started on aspirin and Brilinta that she should continue for one year.  She was also started on low-dose beta blocker for 5 mg twice daily.  She was recommended to stop taking NSAIDs to reduce risk of GI bleeding.  Patient was referred to cardiac rehabilitation.  We will arrange outpatient echocardiogram.  Discharged Condition: stable  Hospital Course:   57 year old African-American female with hypertension, type II diabetes mellitus, hyperlipidemia, morbid obesity, tobacco abuse, admitted to the hospital with complains of chest pain.  Workup showed multiple pulmonary evaluation suggestive of non-STEMI.  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  She was started on aspirin and Brilinta that she should continue for one year.  She was also started on low-dose beta blocker for 5 mg twice daily.  She was recommended to stop taking NSAIDs to reduce risk of GI bleeding.  Patient was referred to cardiac rehabilitation.  We will arrange outpatient echocardiogram.  Consults: None  Significant Diagnostic Studies: Labs  Results for Catherine, Chandler (MRN [id number]) as of 08/20/2017 11:50  Ref. Range 08/20/2017 22:97  BASIC METABOLIC PANEL Unknown Rpt (A)  Sodium Latest  Ref Range: 135 - 145 mmol/L 140  Potassium Latest Ref Range: 3.5 - 5.1 mmol/L 3.5  Chloride Latest Ref Range: 98 - 111 mmol/L 111  CO2 Latest Ref Range: 22 - 32 mmol/L 21 (L)  Glucose Latest Ref Range: 70 - 99 mg/dL 238 (H)  BUN Latest Ref Range: 6 - 20 mg/dL 14  Creatinine Latest Ref Range: 0.44 - 1.00 mg/dL 0.98  Calcium Latest Ref Range: 8.9 - 10.3 mg/dL 8.8 (L)  Anion gap Latest Ref Range: 5 - 15  8  GFR, Est Non African American Latest Ref Range: >60 mL/min >60  GFR, Est African American Latest Ref Range: >60 mL/min >60   Results for Catherine, Chandler (MRN [id number]) as of 08/20/2017 11:50  Ref. Range 08/20/2017 02:29  WBC Latest Ref Range: 4.0 - 10.5 K/uL 15.5 (H)  RBC Latest Ref Range: 3.87 - 5.11 MIL/uL 4.17  Hemoglobin Latest Ref Range: 12.0 - 15.0 g/dL 11.9 (L)  HCT Latest Ref Range: 36.0 - 46.0 % 37.1  MCV Latest Ref Range: 78.0 - 100.0 fL 89.0  MCH Latest Ref Range: 26.0 - 34.0 pg 28.5  MCHC Latest Ref Range: 30.0 - 36.0 g/dL 32.1  RDW Latest Ref Range: 11.5 - 15.5 % 12.8  Platelets Latest Ref Range: 150 - 400 K/uL 323    Treatments: Coronary angiogram 08/19/2017: Normal LV systolic function, EF 74%, normal LVEDP. Left main nonexistent, separate ostia for LAD and circumflex.  LAD has mild diffuse disease.  Circumflex is codominant with RCA and is more than normal. RCA is codominant with circumflex coronary artery.  Mid segment shows a ulcerated 80% stenosis S/P 3.0 x 20 mm Synergy, 80% to 0%, TIMI-3 to TIMI-3 flow maintained.  Recommend uninterrupted dual antiplatelet therapy with Aspirin  81mg  daily and Ticagrelor 90mg  twice daily for a minimum of 12 months (ACS - Class I recommendation).  Aggressive risk factor modification is also indicated.  Patient be discharged home in the morning.   Discharge Exam: Blood pressure (!) 149/61, pulse (!) 59, temperature 98.1 F (36.7 C), resp. rate 18, height 5\' 9"  (1.753 m), weight (!) 148.4 kg (327 lb 1.6 oz), SpO2 98 %. Physical  Exam  Constitutional: She is oriented to person, place, and time. She appears well-developed and well-nourished.  Morbidly obese  HENT:  Head: Normocephalic and atraumatic.  Eyes: Pupils are equal, round, and reactive to light. Conjunctivae are normal.  Neck: Normal range of motion. Neck supple. No JVD present.  Cardiovascular: Normal rate, regular rhythm and normal heart sounds.  No murmur heard. Pulmonary/Chest: Effort normal and breath sounds normal. She has no wheezes. She has no rales.  Abdominal: Soft. Bowel sounds are normal. There is no tenderness. There is no rebound.  Musculoskeletal: She exhibits no edema.  Lymphadenopathy:    She has no cervical adenopathy.  Neurological: She is alert and oriented to person, place, and time. No cranial nerve deficit.  Skin: Skin is warm and dry.  Psychiatric: She has a normal mood and affect.     Disposition: Discharge disposition: 01-Home or Self Care       Discharge Instructions    AMB Referral to Cardiac Rehabilitation - Phase II   Complete by:  As directed    Diagnosis:   Coronary Stents NSTEMI     Amb Referral to Cardiac Rehabilitation   Complete by:  As directed    Diagnosis:  Coronary Stents   Diet - low sodium heart healthy   Complete by:  As directed    Increase activity slowly   Complete by:  As directed      Allergies as of 08/20/2017      Reactions   Codeine Itching   Other    NO PLASTIC TAPE      Medication List    STOP taking these medications   DUEXIS 800-26.6 MG Tabs Generic drug:  Ibuprofen-Famotidine   etodolac 400 MG tablet Commonly known as:  LODINE     TAKE these medications   acetaminophen 325 MG tablet Commonly known as:  TYLENOL Take 2 tablets (650 mg total) by mouth every 4 (four) hours as needed for headache or mild pain. Notes to patient:  As needed for pain    aspirin EC 81 MG tablet Take 81 mg by mouth daily. Notes to patient:  Prevents clotting in stent and heart attack    bisacodyl 10 MG suppository Commonly known as:  BISACODYL LAXATIVE Place 1 suppository (10 mg total) rectally as needed for moderate constipation. Notes to patient:  As needed for constipation   glipiZIDE 5 MG tablet Commonly known as:  GLUCOTROL Take 5 mg by mouth 2 (two) times daily before a meal. Notes to patient:  Controls blood sugar    isosorbide mononitrate 30 MG 24 hr tablet Commonly known as:  IMDUR Take 1 tablet (30 mg total) by mouth daily. Notes to patient:  Increases blood flow to the heart Lowers blood pressure   levothyroxine 300 MCG tablet Commonly known as:  SYNTHROID, LEVOTHROID Take 300 mcg by mouth daily. Notes to patient:  Thyroid    liraglutide 18 MG/3ML Sopn Commonly known as:  VICTOZA Inject 1.8 mg into the skin at bedtime. Notes to patient:  Controls blood sugar    lisinopril 10 MG tablet  Commonly known as:  PRINIVIL Take 1 tablet (10 mg total) by mouth daily.   metoprolol tartrate 25 MG tablet Commonly known as:  LOPRESSOR Take 0.5 tablets (12.5 mg total) by mouth 2 (two) times daily. Notes to patient:  Decreases work of the heart Decreases heart rate and blood pressure Decreases force of contraction of heart muscle   nitroGLYCERIN 0.4 MG SL tablet Commonly known as:  NITROSTAT Place 1 tablet (0.4 mg total) under the tongue every 5 (five) minutes as needed for chest pain (up to 3 doses). Notes to patient:  As needed for chest pain    NOVOLOG MIX 70/30 FLEXPEN (70-30) 100 UNIT/ML FlexPen Generic drug:  insulin aspart protamine - aspart Inject 12-16 Units into the skin 3 (three) times daily with meals. Sliding scale Notes to patient:  Controls blood sugar    potassium chloride SA 20 MEQ tablet Commonly known as:  K-DUR,KLOR-CON Take 20 mEq by mouth as needed (muscle aches/cramps). Notes to patient:  As needed for muscle aches/cramps   ticagrelor 90 MG Tabs tablet Commonly known as:  BRILINTA Take 1 tablet (90 mg total) by mouth 2 (two)  times daily. Notes to patient:  Prevents clotting in stent and heart attack   triamterene-hydrochlorothiazide 75-50 MG tablet Commonly known as:  MAXZIDE Take 0.5 tablets by mouth daily. Notes to patient:  Lowers blood pressure  Weak fluid pill       Follow-up Information    Miquel Dunn, NP On 08/26/2017.   Specialty:  Cardiology Why:  Come at 1:45 pm for 2 pm appointment. Bring all medications Contact information: [place] [address] [phone]           Signed: Nigel Mormon 08/20/2017, 11:48 AM   Nigel Mormon, MD [facility] [place] Pager: [phone] Office: [phone] If no answer Cell [phone]

## 2017-08-20 NOTE — Progress Notes (Signed)
CARDIAC REHAB PHASE I   PRE:  Rate/Rhythm: 61 SR  BP:  Sitting: 100/46       MODE:  Ambulation: 1000 ft  115 peak HR  POST:  Rate/Rhythm: 90 SR  BP:  Sitting: 150/72   Pt ambulated 1057ft in hallway independently with steady gait. Pt c/o slight SOB, but denies CP. Pt states she can tell a difference in her breathing and fatigue level than prior to PCI. Pt educated on importance of Brilinta, ASA and NTG. Pt has stent card, MI booklet, and PCI booklet at bedside. Talked with pt about losing weight and states daughter is a good support system in this. Pt given heart healthy and diabetic diets. Reviewed importance of modifying risk factors, especially because she has a strong family history. Reviewed restrictions and exercise guidelines with pt. Will refer to CRP II [place].  [id number] Catherine Falco, RN BSN 08/20/2017 8:52 AM

## 2017-08-20 NOTE — Progress Notes (Signed)
Brilinta 90mg  bid is covered there is no co pay and does not need prior auth

## 2017-08-22 ENCOUNTER — Telehealth (HOSPITAL_COMMUNITY): Payer: Self-pay

## 2017-08-22 NOTE — Telephone Encounter (Signed)
Pt insurance is active and benefits verified through [employer]. Co-pay $0.00, DED $200.00/$200.00 met, out of pocket $600.00/$600.00 met, co-insurance 30%. No pre-authorization required. Passport, 08/22/17 @ 8:14AM, UDA#[id number]  Will contact patient to see if she is interested in the Cardiac Rehab Program. If interested, patient will need to complete follow up appt. Once completed, patient will be contacted for scheduling upon review by the RN Navigator.

## 2017-09-02 ENCOUNTER — Telehealth (HOSPITAL_COMMUNITY): Payer: Self-pay

## 2017-09-02 NOTE — Telephone Encounter (Signed)
Attempted to contact patient in regards to Cardiac Rehab - lm on vm °

## 2017-09-09 ENCOUNTER — Telehealth: Payer: Self-pay | Admitting: *Deleted

## 2017-09-09 NOTE — Telephone Encounter (Signed)
Called patient regarding Cardiac Rehab, patient stated she would like to hold off on joining right now. States she was unhappy with Dr. Einar Gip and will be finding a new cardiologist and have them send over a new referral.  Closed referral

## 2017-09-09 NOTE — Telephone Encounter (Signed)
Sudley.  I have questions regarding the forms she dropped off./fim

## 2017-09-09 NOTE — Telephone Encounter (Signed)
Pt form on Faith desk.

## 2017-09-10 NOTE — Telephone Encounter (Signed)
Patient returning your call.

## 2017-09-10 NOTE — Telephone Encounter (Signed)
Spoke with Meisha.  She needs insurance forms completed so that [employer] will help cover the cost of her esi.  I will complete forms and medical records will call her when they are available to be picked up. (she requested to pick forms up in our office)/fim

## 2017-09-16 NOTE — Telephone Encounter (Signed)
LMOM for pt. to call.  I have additional questions about insurance forms/fim

## 2017-09-24 ENCOUNTER — Emergency Department (HOSPITAL_COMMUNITY)
Admission: EM | Admit: 2017-09-24 | Discharge: 2017-09-24 | Disposition: A | Discharge status: Home / Self Care | Payer: BLUE CROSS/BLUE SHIELD | Attending: Emergency Medicine | Admitting: Emergency Medicine

## 2017-09-24 ENCOUNTER — Encounter (HOSPITAL_COMMUNITY): Payer: Self-pay | Admitting: Emergency Medicine

## 2017-09-24 ENCOUNTER — Emergency Department (HOSPITAL_COMMUNITY): Payer: BLUE CROSS/BLUE SHIELD

## 2017-09-24 ENCOUNTER — Other Ambulatory Visit: Payer: Self-pay

## 2017-09-24 DIAGNOSIS — E039 Hypothyroidism, unspecified: Secondary | ICD-10-CM | POA: Diagnosis not present

## 2017-09-24 DIAGNOSIS — I10 Essential (primary) hypertension: Secondary | ICD-10-CM | POA: Diagnosis not present

## 2017-09-24 DIAGNOSIS — E119 Type 2 diabetes mellitus without complications: Secondary | ICD-10-CM | POA: Diagnosis not present

## 2017-09-24 DIAGNOSIS — Z7982 Long term (current) use of aspirin: Secondary | ICD-10-CM | POA: Insufficient documentation

## 2017-09-24 DIAGNOSIS — Z79899 Other long term (current) drug therapy: Secondary | ICD-10-CM | POA: Insufficient documentation

## 2017-09-24 DIAGNOSIS — M79671 Pain in right foot: Secondary | ICD-10-CM | POA: Diagnosis not present

## 2017-09-24 DIAGNOSIS — Z87891 Personal history of nicotine dependence: Secondary | ICD-10-CM | POA: Insufficient documentation

## 2017-09-24 MED ORDER — PREDNISONE 20 MG PO TABS
60.0000 mg | ORAL_TABLET | Freq: Once | ORAL | Status: AC
  Administered 2017-09-24: 60 mg via ORAL
  Filled 2017-09-24: qty 3

## 2017-09-24 MED ORDER — HYDROCODONE-ACETAMINOPHEN 5-325 MG PO TABS
1.0000 | ORAL_TABLET | Freq: Once | ORAL | Status: AC
  Administered 2017-09-24: 1 via ORAL
  Filled 2017-09-24: qty 1

## 2017-09-24 MED ORDER — HYDROCODONE-ACETAMINOPHEN 5-325 MG PO TABS: 1.0000 | ORAL_TABLET | Freq: Four times a day (QID) | ORAL | 0 refills | Status: DC | PRN

## 2017-09-24 NOTE — ED Provider Notes (Signed)
[facility] EMERGENCY DEPARTMENT Provider Note   CSN: [id number] Arrival date & time: 09/24/17  0636     History   Chief Complaint Chief Complaint  Patient presents with  . Foot Pain    HPI [name] is a 57 y.o. female.  HPI Patient awoke yesterday with mildly sore right forefoot.  She reports that she continued her usual activities.  Did not have any injuries.  She reports by evening it was much worse.  This morning she reports that she has severe pain with any weightbearing.  Pain is localized to the area of the base of the great toe and somewhat on the forefoot.  She reports it is exquisitely tender even to light touch at that area.  She has no prior history of gout.  Reports she is had some problems with chronic hip and back pain on the right side.  He has had injections in her back which have alleviated some pain.  She reports typically for pain she takes ibuprofen but she has had recent stents and is now on Brilinta and aspirin.  She has no associated constitutional symptoms.  No malaise, no fever, no chills. Past Medical History:  Diagnosis Date  . ALLERGIC RHINITIS   . 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 Asthma    Questionable  . Diabetes mellitus, type 2 ([place])   . History of tobacco abuse    Quit 2011  . Hypertension   . Hypertriglyceridemia   . Hypothyroidism   . NSTEMI (non-ST elevated myocardial infarction) ([place]) 09/30/11   NO CAD by cath, ? coronary vasospasm  . Obesity     Patient Active Problem List   Diagnosis Date Noted  . NSTEMI (non-ST elevated myocardial infarction) ([place]) 08/18/2017  . Right sided sciatica 07/10/2017  . Right low back pain 07/10/2017  . Chest pain 09/29/2011  . Morbid obesity ([place]) 09/29/2011  . DM type 2 (diabetes mellitus, type 2) ([place]) 09/29/2011  . HTN (hypertension) 09/29/2011  . Pneumonia, bacterial 08/07/2010  . Incidental lung nodule, > 21mm and < 46mm 08/07/2010  . Bronchitis 08/07/2010    Past Surgical History:   Procedure Laterality Date  . CORONARY STENT INTERVENTION N/A 08/19/2017   Procedure: CORONARY STENT INTERVENTION;  Surgeon: [name], MD;  Location: [facility];  Service: Cardiovascular;  Laterality: N/A;  . LEFT HEART CATH AND CORONARY ANGIOGRAPHY N/A 08/19/2017   Procedure: LEFT HEART CATH AND CORONARY ANGIOGRAPHY;  Surgeon: [name], MD;  Location: [facility];  Service: Cardiovascular;  Laterality: N/A;  . LEFT HEART CATHETERIZATION WITH CORONARY ANGIOGRAM N/A 09/30/2011   Procedure: LEFT HEART CATHETERIZATION WITH CORONARY ANGIOGRAM;  Surgeon: Burnell Blanks, MD;  Location[facility] C[facility] LAB;  Service: Cardiovascular;  Laterality: N/A;  . TUBAL LIGATION    . VESICOVAGINAL FISTULA CLOSURE W/ TAH       OB History   None      Home Medications    Prior to Admission medications   Medication Sig Start Date End Date Taking? Authorizing Provider  acetaminophen (TYLENOL) 325 MG tablet Take 2 tablets (650 mg total) by mouth every 4 (four) hours as needed for headache or mild pain. 08/20/17   Patwardhan, Reynold Bowen, MD  aspirin EC 81 MG tablet Take 81 mg by mouth daily.    [provider]  bisacodyl (BISACODYL LAXATIVE) 10 MG suppository Place 1 suppository (10 mg total) rectally as needed for moderate constipation. 08/20/17   Patwardhan, Reynold Bowen, MD  glipiZIDE (GLUCOTROL) 5 MG tablet Take 5 mg by mouth 2 (two) times daily before a meal.    [provider]  HYDROcodone-acetaminophen (NORCO/VICODIN) 5-325 MG tablet Take 1-2 tablets by mouth every 6 (six) hours as needed for moderate pain. 09/24/17   Charlesetta Shanks, MD  isosorbide mononitrate (IMDUR) 30 MG 24 hr tablet Take 1 tablet (30 mg total) by mouth daily. 10/01/11 08/18/25  Hope, Jessica A, PA-C  levothyroxine (SYNTHROID, LEVOTHROID) 300 MCG tablet Take 300 mcg by mouth daily. 06/22/17   [provider]  liraglutide (VICTOZA) 18 MG/3ML SOPN Inject 1.8 mg into the skin at bedtime.    [provider]  lisinopril (PRINIVIL) 10 MG tablet Take 1 tablet (10 mg total) by mouth daily. Patient not taking: Reported on 08/18/2017 10/01/11 08/18/25  Hope, Janett Billow A, PA-C  metoprolol tartrate (LOPRESSOR) 25 MG tablet Take 0.5 tablets (12.5 mg total) by mouth 2 (two) times daily. 08/20/17 08/20/18  Patwardhan, Reynold Bowen, MD  nitroGLYCERIN (NITROSTAT) 0.4 MG SL tablet Place 1 tablet (0.4 mg total) under the tongue every 5 (five) minutes as needed for chest pain (up to 3 doses). 10/01/11 08/18/25  Hope, Jessica A, PA-C  NOVOLOG MIX 70/30 FLEXPEN (70-30) 100 UNIT/ML FlexPen Inject 12-16 Units into the skin 3 (three) times daily with meals. Sliding scale 08/15/17   [provider]  potassium chloride SA (K-DUR,KLOR-CON) 20 MEQ tablet Take 20 mEq by mouth as needed (muscle aches/cramps).  07/04/10   [provider]  ticagrelor (BRILINTA) 90 MG TABS tablet Take 1 tablet (90 mg total) by mouth 2 (two) times daily. 08/20/17   Patwardhan, Reynold Bowen, MD  triamterene-hydrochlorothiazide (MAXZIDE) 75-50 MG per tablet Take 0.5 tablets by mouth daily. 10/01/11   Hope, Leona Carry, PA-C    Family History Family History  Problem Relation Age of Onset  . Heart disease Mother        has pacemaker  . Diabetes Mother   . Diabetes Unknown        strong family history  . Cancer Maternal Uncle     Social History Social History   Tobacco Use  . Smoking status: Former Smoker    Packs/day: 0.80    Years: 36.00    Pack years: 28.80    Types: Cigarettes    Last attempt to quit: 07/04/2010    Years since quitting: 7.2  . Smokeless tobacco: Never Used  Substance Use Topics  . Alcohol use: Yes    Comment: VERY RARE  . Drug use: No     Allergies   Contrast media [iodinated diagnostic agents]; Codeine; and Other   Review of Systems Review of Systems  Constitutional: No fever no chills no myalgias Cardiovascular: No chest pain, no shortness of breath Physical Exam Updated Vital Signs BP  (!) 121/107   Pulse 92   Temp 98.8 F (37.1 C) (Oral)   Resp 15   SpO2 97%   Physical Exam  Constitutional:  Patient is alert and nontoxic.  No respiratory distress.  Clinically well in appearance.  Obesity.  HENT:  Head: Normocephalic and atraumatic.  Eyes: EOM are normal.  Musculoskeletal: She exhibits tenderness.  Right foot has diffuse swelling at the first metatarsal joint.  Pain with motion of the great toe.  Great toe is not swollen or erythematous.  Pain in the sole of the foot at the area of the metacarpal joint.  No pain to palpation of the sole of the foot except adjacent to metacarpal joint.  No lower leg edema.  Calf is soft and nontender.  Dorsalis pedis pulse 2+.  Skin: Skin is warm and dry.  Psychiatric: She has a normal mood and affect.         ED Treatments / Results  Labs (all labs ordered are listed, but only abnormal results are displayed) Labs Reviewed - No data to display  EKG None  Radiology Dg Foot Complete Right  Result Date: 09/24/2017 CLINICAL DATA:  Right foot pain and swelling with redness beginning yesterday. No injury. EXAM: RIGHT FOOT COMPLETE - 3+ VIEW COMPARISON:  None. FINDINGS: No evidence of acute fracture or dislocation. Minimal degenerative change over the midfoot/hindfoot. Small inferior calcaneal spur. Lucency through the mid aspect of the medial sesamoid at the first MTP joint likely chronic. IMPRESSION: No acute findings. Electronically Signed   By: Marin Olp M.D.   On: 09/24/2017 07:39    Procedures Procedures (including critical care time)  Medications Ordered in ED Medications  HYDROcodone-acetaminophen (NORCO/VICODIN) 5-325 MG per tablet 1 tablet (1 tablet Oral Given 09/24/17 0749)  predniSONE (DELTASONE) tablet 60 mg (60 mg Oral Given 09/24/17 0807)     Initial Impression / Assessment and Plan / ED Course  I have reviewed the triage vital signs and the nursing notes.  Pertinent labs & imaging results that were  available during my care of the patient were reviewed by me and considered in my medical decision making (see chart for details).      Final Clinical Impressions(s) / ED Diagnoses   Final diagnoses:  Foot pain, right  At this time, I suspect gout as etiology.  Pain and swelling have come up quickly right at the metatarsal joint.  Patient is exquisitely tender to light touch in the area.  She has no associated constitutional symptoms to suggest infection nor any site of likely infection on the foot.  At this time will start empiric treatment focused on gout with 1 dose of prednisone in the emergency department.  Vicodin for pain.  Patient has appointment at [facility] orthopedics tomorrow.  She is to have recheck and determine if ongoing treatment for gout is appropriate or if she needs further diagnostic evaluation.  Patient is counseled on necessity to return immediately should she develop constitutional symptoms such as fever, malaise, general illness or increasing redness of the forefoot.  ED Discharge Orders         Ordered    HYDROcodone-acetaminophen (NORCO/VICODIN) 5-325 MG tablet  Every 6 hours PRN     09/24/17 2035           Charlesetta Shanks, MD 09/24/17 [phone]

## 2017-09-24 NOTE — ED Triage Notes (Signed)
Pt reports right foot pain, swelling and redness that began yesterday, no known injury.

## 2017-09-24 NOTE — Discharge Instructions (Signed)
1.  Follow-up at [facility] or tomorrow as planned.  Have your foot checked for redness and swelling. 2.  Based on today's exam, gout is suspected.  You were given a 60 mg dose of prednisone.  You will have to monitor your blood sugars closely due to potential elevations from steroid use.  During your exam tomorrow, it should be determined if you will benefit from ongoing steroid use and need a prescription.  Have given Vicodin to take for pain.  Take 1 to 2 tablets every 6 hours as needed.  If you have any tendency towards constipation, take an over-the-counter stool softener because narcotics can cause constipation. 3.  If redness is spreading over more of your forefoot or you develop fever or feeling generally ill, you must return to the emergency department for further evaluation of possible early foot infection.

## 2017-10-03 ENCOUNTER — Ambulatory Visit: Payer: BLUE CROSS/BLUE SHIELD | Admitting: Neurology

## 2017-10-03 ENCOUNTER — Encounter: Payer: Self-pay | Admitting: Neurology

## 2017-10-03 VITALS — BP 125/70 | HR 84 | Resp 22 | Ht 69.0 in | Wt 317.0 lb

## 2017-10-03 DIAGNOSIS — M5431 Sciatica, right side: Secondary | ICD-10-CM

## 2017-10-03 NOTE — Telephone Encounter (Signed)
Insurance forms completed and given to pt. today.  Copy sent to be scanned into emr/fim

## 2017-10-03 NOTE — Progress Notes (Signed)
   [facility]  PATIENT: [name] DOB: 02/10/1960  REFERRING DOCTOR OR PCP:  Dr Vista Lawman SOURCE: Patient, notes from PCP  _________________________________   HISTORICAL  CHIEF COMPLAINT:  Chief Complaint  Patient presents with  . Back Pain    Sts. Right L4-L5 esi done on 07/25/17 helped lower back pain.  Sts. 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 Since last ov, she's had an MI, cardiac cath with one stent placed. Is not able to take NSAIDS.  Does not request anything further from our office other than Insurance forms ([employer] papers) completed so that they will cover the cost of her esi/fim    HISTORY OF PRESENT ILLNESS:  [name] is a 57 y.o. woman with LBP and leg weakness.   Update 10/03/2017: She is here today for paperwork only and did not need an evaluation   From 07/10/2017 She is a 57 year old woman who began to experience pain and a heavy sensation in her legs since November 2018.     There was no preceding event.   She just started noting pain in the left foot initially.   She went to [facility] and she had an MRI that was reportedly normal.    She was referred to a foot/ankle doctor (Dr. Doran Durand) who did a shot in the left foot.   Pain improved in the foot.   Also since November 2018, she has a heavy sensation in her proximal legs and she has more trouble getting out of a chair and walking.      The right hip is painful and this started shortly after her left foot pain began.   She feels she had to put a lot of weight on the right leg.   The right hip pain is worse when she sits on the right buttock.   She has less pain when she uses a recliner.       She has not had any lumbar or hip imaging or injections.    She gets some benefit from Duexis (Ibuprofen/famotidine)  Her gait is reduced due to the heavy sensation and her right hip pain     To get out of a chair she needs to scoot forward and then use  her arms.    She denies any weakness in her arms, shoulders or face.   No change imm speech/swallow.   No ptosis or diplopia.         ASSESSMENT AND PLAN  Right sided sciatica  Insurance paperwork completed for her

## 2017-10-16 ENCOUNTER — Ambulatory Visit: Payer: BLUE CROSS/BLUE SHIELD | Admitting: Neurology

## 2017-11-04 DIAGNOSIS — Z0271 Encounter for disability determination: Secondary | ICD-10-CM

## 2018-03-10 ENCOUNTER — Emergency Department (HOSPITAL_COMMUNITY)
Admission: EM | Admit: 2018-03-10 | Discharge: 2018-03-10 | Disposition: A | Discharge status: Home / Self Care | Payer: Medicaid Other | Attending: Emergency Medicine | Admitting: Emergency Medicine

## 2018-03-10 ENCOUNTER — Encounter (HOSPITAL_COMMUNITY): Payer: Self-pay

## 2018-03-10 ENCOUNTER — Emergency Department (HOSPITAL_COMMUNITY): Payer: Medicaid Other

## 2018-03-10 ENCOUNTER — Emergency Department (HOSPITAL_BASED_OUTPATIENT_CLINIC_OR_DEPARTMENT_OTHER): Payer: Medicaid Other

## 2018-03-10 ENCOUNTER — Institutional Professional Consult (permissible substitution): Payer: BLUE CROSS/BLUE SHIELD | Admitting: Internal Medicine

## 2018-03-10 DIAGNOSIS — E119 Type 2 diabetes mellitus without complications: Secondary | ICD-10-CM | POA: Diagnosis not present

## 2018-03-10 DIAGNOSIS — I252 Old myocardial infarction: Secondary | ICD-10-CM | POA: Insufficient documentation

## 2018-03-10 DIAGNOSIS — Z79899 Other long term (current) drug therapy: Secondary | ICD-10-CM | POA: Diagnosis not present

## 2018-03-10 DIAGNOSIS — J45909 Unspecified asthma, uncomplicated: Secondary | ICD-10-CM | POA: Insufficient documentation

## 2018-03-10 DIAGNOSIS — Z7982 Long term (current) use of aspirin: Secondary | ICD-10-CM | POA: Insufficient documentation

## 2018-03-10 DIAGNOSIS — I10 Essential (primary) hypertension: Secondary | ICD-10-CM | POA: Diagnosis not present

## 2018-03-10 DIAGNOSIS — Z87891 Personal history of nicotine dependence: Secondary | ICD-10-CM | POA: Diagnosis not present

## 2018-03-10 DIAGNOSIS — Z794 Long term (current) use of insulin: Secondary | ICD-10-CM | POA: Insufficient documentation

## 2018-03-10 DIAGNOSIS — M79604 Pain in right leg: Secondary | ICD-10-CM | POA: Insufficient documentation

## 2018-03-10 DIAGNOSIS — R609 Edema, unspecified: Secondary | ICD-10-CM | POA: Diagnosis not present

## 2018-03-10 DIAGNOSIS — E039 Hypothyroidism, unspecified: Secondary | ICD-10-CM | POA: Diagnosis not present

## 2018-03-10 DIAGNOSIS — R52 Pain, unspecified: Secondary | ICD-10-CM

## 2018-03-10 LAB — BASIC METABOLIC PANEL
ANION GAP: 12 (ref 5–15)
BUN: 23 mg/dL — ABNORMAL HIGH (ref 6–20)
CHLORIDE: 102 mmol/L (ref 98–111)
CO2: 22 mmol/L (ref 22–32)
Calcium: 9.4 mg/dL (ref 8.9–10.3)
Creatinine, Ser: 1.19 mg/dL — ABNORMAL HIGH (ref 0.44–1.00)
GFR calc non Af Amer: 51 mL/min — ABNORMAL LOW (ref >60)
GFR, EST AFRICAN AMERICAN: 59 mL/min — AB (ref >60)
GLUCOSE: 286 mg/dL — AB (ref 70–99)
Potassium: 3.6 mmol/L (ref 3.5–5.1)
Sodium: 136 mmol/L (ref 135–145)

## 2018-03-10 LAB — CBC WITH DIFFERENTIAL/PLATELET
ABS IMMATURE GRANULOCYTES: 0.05 10*3/uL (ref 0.00–0.07)
Basophils Absolute: 0.1 10*3/uL (ref 0.0–0.1)
Basophils Relative: 0 %
Eosinophils Absolute: 0.2 10*3/uL (ref 0.0–0.5)
Eosinophils Relative: 2 %
HEMATOCRIT: 41.4 % (ref 36.0–46.0)
HEMOGLOBIN: 13.4 g/dL (ref 12.0–15.0)
IMMATURE GRANULOCYTES: 0 %
LYMPHS ABS: 4.8 10*3/uL — AB (ref 0.7–4.0)
LYMPHS PCT: 39 %
MCH: 27.7 pg (ref 26.0–34.0)
MCHC: 32.4 g/dL (ref 30.0–36.0)
MCV: 85.7 fL (ref 80.0–100.0)
MONOS PCT: 12 %
Monocytes Absolute: 1.5 10*3/uL — ABNORMAL HIGH (ref 0.1–1.0)
NEUTROS ABS: 5.9 10*3/uL (ref 1.7–7.7)
NEUTROS PCT: 47 %
NRBC: 0 % (ref 0.0–0.2)
Platelets: 334 10*3/uL (ref 150–400)
RBC: 4.83 MIL/uL (ref 3.87–5.11)
RDW: 14.5 % (ref 11.5–15.5)
WBC: 12.4 10*3/uL — ABNORMAL HIGH (ref 4.0–10.5)

## 2018-03-10 LAB — I-STAT TROPONIN, ED
TROPONIN I, POC: 0.01 ng/mL (ref 0.00–0.08)
Troponin i, poc: 0 ng/mL (ref 0.00–0.08)

## 2018-03-10 MED ORDER — METHOCARBAMOL 500 MG PO TABS: 500.0000 mg | ORAL_TABLET | Freq: Two times a day (BID) | ORAL | 0 refills | Status: DC

## 2018-03-10 MED ORDER — LIDOCAINE 5 % EX PTCH: 1.0000 | MEDICATED_PATCH | CUTANEOUS | 0 refills | Status: DC

## 2018-03-10 MED ORDER — HYDROCODONE-ACETAMINOPHEN 5-325 MG PO TABS
1.0000 | ORAL_TABLET | Freq: Once | ORAL | Status: AC
  Administered 2018-03-10: 1 via ORAL
  Filled 2018-03-10: qty 1

## 2018-03-10 MED ORDER — SODIUM CHLORIDE 0.9 % IV BOLUS: 500.0000 mL | Freq: Once | INTRAVENOUS | Status: DC

## 2018-03-10 NOTE — ED Notes (Signed)
Per PA, pt can drink fluids PO instead of receiving IV fluid bolus

## 2018-03-10 NOTE — ED Triage Notes (Signed)
Pt reports ongoing right leg pain for months, states the pain starts in her lower leg and radiates to her hip, throbbing pain. Pt states she has been having problems with her leg ever since she had a stent placed in her heart in July of 2019. Pt states they went through her right radial for the procedure. Pulses palpable, full ROM, nad noted.

## 2018-03-10 NOTE — ED Notes (Signed)
Patient transported to X-ray 

## 2018-03-10 NOTE — Progress Notes (Signed)
Right lower extremity venous duplex completed. Preliminary results found in chart review CV Proc. [facility], [place] 03/10/2018, 8:09 PM

## 2018-03-10 NOTE — Discharge Instructions (Signed)
You can take Tylenol or Ibuprofen as directed for pain. You can alternate Tylenol and Ibuprofen every 4 hours. If you take Tylenol at 1pm, then you can take Ibuprofen at 5pm. Then you can take Tylenol again at 9pm.   Use lidocaine patches as directed.  Take Robaxin as prescribed. This medication will make you drowsy so do not drive or drink alcohol when taking it.  Follow up with referred neurosurgeon.  Return emergency part for any chest pain, difficulty breathing, numbness/weakness of the leg, redness or swelling of the leg, fevers or any other worsening or concerning symptoms.

## 2018-03-10 NOTE — ED Provider Notes (Signed)
[facility] DEPART[facility] Provider Note   CSN: [id number] Arrival date & time: 03/10/18  1225     History   Chief Complaint Chief Complaint  Patient presents with  . Leg Pain    HPI [name] is a 58 y.o. female with PMH/o Asthma, DM, HTN, Hypothryoidism, NSTEMI who presents for evaluation of RLE pain that has been ongoing for 7 months but worsened in the last week. Patient reports that initially she had some pain in her right foot. She was told it might be gout vs msk pain. Patient had some pain medication but states that the pain came back. She followed up with Raliegh Ip (Ortho) and was given a steroid injection. She stated that this helped for awhile but then pain would return.  She states that since then, she has had intermittent pain in that right lower extremity.  She does report that over the last week, the pain is significantly worsened.  She describes it as a "throbbing" pain that radiates from her foot all the way up into her leg.  She states she has noticed some intermittent swelling and redness.  She states that the swelling gets worse when she is on her feet and walking around all day.  She states that she has had difficulty walking secondary to the pain.  She states that the swelling improves if she sits down with her feet elevated.  She states she has had some similar symptoms in the left lower extremity but not as bad in the right lower extremity.  She denies any preceding trauma, injury, fall.  She states that a few weeks ago, she had 1 brief episode of slight numbness/tingling to the upper thigh but no other areas.  She denies any weakness.  She reports pain when she tries to move or ambulate on the leg.  Patient does report that since her cardiac stent in July 2018, she has been less mobile than normal.  Patient reports that she will occasionally have brief episodes of transient chest pain that resolves very quickly.  She states it does not feel  like she did when she had her NSTEMI in July.  Patient has not had any difficulty breathing.  Patient does report that yesterday, she started experiencing some pain in the posterior right buttocks.  She states that the pain does not radiate down her leg.  Patient had one episode of urinary continence  The history is provided by the patient.    Past Medical History:  Diagnosis Date  . ALLERGIC RHINITIS   . 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 Asthma    Questionable  . Diabetes mellitus, type 2 ([place])   . History of tobacco abuse    Quit 2011  . Hypertension   . Hypertriglyceridemia   . Hypothyroidism   . NSTEMI (non-ST elevated myocardial infarction) ([place]) 09/30/11   NO CAD by cath, ? coronary vasospasm  . Obesity     Patient Active Problem List   Diagnosis Date Noted  . NSTEMI (non-ST elevated myocardial infarction) ([place]) 08/18/2017  . Right sided sciatica 07/10/2017  . Right low back pain 07/10/2017  . Chest pain 09/29/2011  . Morbid obesity ([place]) 09/29/2011  . DM type 2 (diabetes mellitus, type 2) ([place]) 09/29/2011  . HTN (hypertension) 09/29/2011  . Pneumonia, bacterial 08/07/2010  . Incidental lung nodule, > 68mm and < 79mm 08/07/2010  . Bronchitis 08/07/2010    Past Surgical History:  Procedure Laterality Date  . CORONARY STENT  INTERVENTION N/A 08/19/2017   Procedure: CORONARY STENT INTERVENTION;  Surgeon: [name], MD;  Location: [facility];  Service: Cardiovascular;  Laterality: N/A;  . LEFT HEART CATH AND CORONARY ANGIOGRAPHY N/A 08/19/2017   Procedure: LEFT HEART CATH AND CORONARY ANGIOGRAPHY;  Surgeon: [name], MD;  Location: [facility];  Service: Cardiovascular;  Laterality: N/A;  . LEFT HEART CATHETERIZATION WITH CORONARY ANGIOGRAM N/A 09/30/2011   Procedure: LEFT HEART CATHETERIZATION WITH CORONARY ANGIOGRAM;  Surgeon: Burnell Blanks, MD;  Location: [facility];  Service: Cardiovascular;  Laterality: N/A;  . TUBAL LIGATION    . VESICOVAGINAL FISTULA  CLOSURE W/ TAH       OB History   No obstetric history on file.      Home Medications    Prior to Admission medications   Medication Sig Start Date End Date Taking? Authorizing Provider  aspirin EC 81 MG tablet Take 81 mg by mouth daily.    [provider]  bisacodyl (BISACODYL LAXATIVE) 10 MG suppository Place 1 suppository (10 mg total) rectally as needed for moderate constipation. 08/20/17   Patwardhan, Reynold Bowen, MD  Dulaglutide (TRULICITY) 1.5 VP/7.1GG SOPN Inject into the skin.    [provider]  glipiZIDE (GLUCOTROL) 5 MG tablet Take 5 mg by mouth 2 (two) times daily before a meal.    [provider]  isosorbide mononitrate (IMDUR) 30 MG 24 hr tablet Take 1 tablet (30 mg total) by mouth daily. 10/01/11 08/18/25  Hope, Jessica A, PA-C  levothyroxine (SYNTHROID, LEVOTHROID) 300 MCG tablet Take 300 mcg by mouth daily. 06/22/17   [provider]  lidocaine (LIDODERM) 5 % Place 1 patch onto the skin daily. Remove & Discard patch within 12 hours or as directed by MD 03/10/18   Volanda Napoleon, PA-C  methocarbamol (ROBAXIN) 500 MG tablet Take 1 tablet (500 mg total) by mouth 2 (two) times daily. 03/10/18   Volanda Napoleon, PA-C  metoprolol tartrate (LOPRESSOR) 25 MG tablet Take 0.5 tablets (12.5 mg total) by mouth 2 (two) times daily. 08/20/17 08/20/18  Patwardhan, Reynold Bowen, MD  nitroGLYCERIN (NITROSTAT) 0.4 MG SL tablet Place 1 tablet (0.4 mg total) under the tongue every 5 (five) minutes as needed for chest pain (up to 3 doses). 10/01/11 08/18/25  Hope, Jessica A, PA-C  NOVOLOG MIX 70/30 FLEXPEN (70-30) 100 UNIT/ML FlexPen Inject 12-16 Units into the skin 3 (three) times daily with meals. Sliding scale 08/15/17   [provider]  potassium chloride SA (K-DUR,KLOR-CON) 20 MEQ tablet Take 20 mEq by mouth as needed (muscle aches/cramps).  07/04/10   [provider]  rosuvastatin (CRESTOR) 5 MG tablet Take 5 mg by mouth daily.    [provider]  ticagrelor (BRILINTA) 90 MG TABS tablet Take 1 tablet (90 mg total) by mouth 2 (two) times daily. 08/20/17   Patwardhan, Reynold Bowen, MD  triamterene-hydrochlorothiazide (MAXZIDE) 75-50 MG per tablet Take 0.5 tablets by mouth daily. 10/01/11   Hope, Leona Carry, PA-C    Family History Family History  Problem Relation Age of Onset  . Heart disease Mother        has pacemaker  . Diabetes Mother   . Diabetes Other        strong family history  . Cancer Maternal Uncle     Social History Social History   Tobacco Use  . Smoking status: Former Smoker    Packs/day: 0.80    Years: 36.00    Pack years:  28.80    Types: Cigarettes    Last attempt to quit: 07/04/2010    Years since quitting: 7.6  . Smokeless tobacco: Never Used  Substance Use Topics  . Alcohol use: Yes    Comment: VERY RARE  . Drug use: No     Allergies   Contrast media [iodinated diagnostic agents]; Codeine; and Other   Review of Systems Review of Systems  Constitutional: Negative for fever.  Respiratory: Negative for cough and shortness of breath.   Cardiovascular: Negative for chest pain.  Gastrointestinal: Negative for abdominal pain, nausea and vomiting.  Genitourinary: Negative for dysuria and hematuria.  Musculoskeletal:       Right leg pain  Neurological: Negative for weakness and headaches.  All other systems reviewed and are negative.    Physical Exam Updated Vital Signs BP (!) 104/48 (BP Location: Right Arm)   Pulse 96   Temp 98.3 F (36.8 C) (Oral)   Resp 18   SpO2 96%   Physical Exam Vitals signs and nursing note reviewed.  Constitutional:      Appearance: Normal appearance. She is well-developed.  HENT:     Head: Normocephalic and atraumatic.  Eyes:     General: Lids are normal.     Conjunctiva/sclera: Conjunctivae normal.     Pupils: Pupils are equal, round, and reactive to light.  Neck:     Musculoskeletal: Full passive range of motion without pain.     Comments:  Full flexion/extension and lateral movement of neck fully intact. No bony midline tenderness. No deformities or crepitus. No carotid bruit. Cardiovascular:     Rate and Rhythm: Normal rate and regular rhythm.     Pulses: Normal pulses.          Radial pulses are 2+ on the right side.       Dorsalis pedis pulses are 2+ on the right side and 2+ on the left side.     Heart sounds: Normal heart sounds. No murmur. No friction rub. No gallop.   Pulmonary:     Effort: Pulmonary effort is normal.     Breath sounds: Normal breath sounds.     Comments: Lungs clear to auscultation bilaterally.  Symmetric chest rise.  No wheezing, rales, rhonchi. Abdominal:     Palpations: Abdomen is soft. Abdomen is not rigid.     Tenderness: There is no abdominal tenderness. There is no guarding.  Musculoskeletal: Normal range of motion.     Thoracic back: She exhibits no tenderness.     Lumbar back: She exhibits no tenderness.     Comments: Tension of right lower extremity intact any difficulty.  Patient has some mild tenderness noted to the anterior aspect of the right thigh.  No bony deformity.  She reports subjective pain with flexion of the right lower extremity as well as internal and external rotation.  No tenderness palpation of the left lower extremity.  Full range of motion of left lower extremity intact without any difficulty.  No tenderness palpation noted to proximal, distal tib-fib as well as right ankle.  Skin:    General: Skin is warm and dry.     Capillary Refill: Capillary refill takes less than 2 seconds.     Comments: Good distal cap refill.  RLE is not dusky in appearance or cool to touch.  Neurological:     Mental Status: She is alert and oriented to person, place, and time.     Comments: Cranial nerves III-XII intact Follows commands, Moves all  extremities  5/5 strength to BUE and BLE  Sensation intact throughout all major nerve distributions Normal coordination No gait abnormalities  No  slurred speech. No facial droop.  Positive SLR  Psychiatric:        Speech: Speech normal.     ED Treatments / Results  Labs (all labs ordered are listed, but only abnormal results are displayed) Labs Reviewed  BASIC METABOLIC PANEL - Abnormal; Notable for the following components:      Result Value   Glucose, Bld 286 (*)    BUN 23 (*)    Creatinine, Ser 1.19 (*)    GFR calc non Af Amer 51 (*)    GFR calc Af Amer 59 (*)    All other components within normal limits  CBC WITH DIFFERENTIAL/PLATELET - Abnormal; Notable for the following components:   WBC 12.4 (*)    Lymphs Abs 4.8 (*)    Monocytes Absolute 1.5 (*)    All other components within normal limits  I-STAT TROPONIN, ED  I-STAT TROPONIN, ED    EKG EKG Interpretation  Date/Time:  Tuesday March 10 2018 19:08:02 EST Ventricular Rate:  92 PR Interval:    QRS Duration: 82 QT Interval:  393 QTC Calculation: 487 R Axis:   72 Text Interpretation:  Sinus rhythm Low voltage, precordial leads Borderline T abnormalities, anterior leads Borderline prolonged QT interval Confirmed by Quintella Reichert [phone]) on 03/10/2018 7:36:53 PM Also confirmed by Quintella Reichert [phone]), editor Owens Shark, Deirdre [phone])  on 03/11/2018 7:33:55 AM   Radiology Dg Chest 2 View  Result Date: 03/10/2018 CLINICAL DATA:  58 year old female with chest and right leg pain. EXAM: CHEST - 2 VIEW COMPARISON:  08/17/2017 chest radiographs and earlier. FINDINGS: Lung volumes and mediastinal contours remain normal. Eventration of the right hemidiaphragm, normal variant. Both lungs are clear. No pneumothorax or pleural effusion. No acute osseous abnormality identified. Negative visible bowel gas pattern. IMPRESSION: Negative.  No cardiopulmonary abnormality. Electronically Signed   By: Genevie Ann M.D.   On: 03/10/2018 19:18   Dg Hip Unilat W Or Wo Pelvis 2-3 Views Right  Result Date: 03/10/2018 CLINICAL DATA:  58 year old female with persistent right leg pain, right  hip and lower leg. EXAM: DG HIP (WITH OR WITHOUT PELVIS) 2-3V RIGHT COMPARISON:  None. FINDINGS: Femoral heads are normally located. The hip joint spaces appear symmetric and normal for age. Bone mineralization is within normal limits. Pelvis intact. SI joints appear normal. Negative visible lower abdominal and pelvic visceral contours. Grossly intact proximal left femur. The proximal right femur appears intact and normal. IMPRESSION: Normal for age radiographic appearance of the right hip and pelvis. Electronically Signed   By: Genevie Ann M.D.   On: 03/10/2018 23:23   Vas [place] Lower Extremity Venous (dvt) (only Mc & Wl 7a-7p)  Result Date: 03/10/2018  Lower Venous Study Indications: Pain, and Edema.  Performing Technologist: Toma Copier RVS  Examination Guidelines: A complete evaluation includes B-mode imaging, spectral Doppler, color Doppler, and power Doppler as needed of all accessible portions of each vessel. Bilateral testing is considered an integral part of a complete examination. Limited examinations for reoccurring indications may be performed as noted.  Right Venous Findings: +---------+---------------+---------+-----------+----------+------------------+          CompressibilityPhasicitySpontaneityPropertiesSummary            +---------+---------------+---------+-----------+----------+------------------+ CFV      Full           Yes      Yes                                     +---------+---------------+---------+-----------+----------+------------------+  SFJ      Full                                                            +---------+---------------+---------+-----------+----------+------------------+ FV Prox  Full           Yes      Yes                                     +---------+---------------+---------+-----------+----------+------------------+ FV Mid   Full                                                             +---------+---------------+---------+-----------+----------+------------------+ FV DistalFull           Yes      Yes                                     +---------+---------------+---------+-----------+----------+------------------+ PFV      Full           Yes      Yes                                     +---------+---------------+---------+-----------+----------+------------------+ POP      Full           Yes      Yes                                     +---------+---------------+---------+-----------+----------+------------------+ PTV      Full                                                            +---------+---------------+---------+-----------+----------+------------------+ PERO     Full                                         Difficult to image +---------+---------------+---------+-----------+----------+------------------+  Left Venous Findings: +---+---------------+---------+-----------+----------+-------+    CompressibilityPhasicitySpontaneityPropertiesSummary +---+---------------+---------+-----------+----------+-------+ CFVFull           Yes      Yes                          +---+---------------+---------+-----------+----------+-------+ SFJFull                                                 +---+---------------+---------+-----------+----------+-------+    Summary: Right: There is no evidence of  deep vein thrombosis in the lower extremity. No cystic structure found in the popliteal fossa. Left: There is no evidence of a common femoral vein obstruction  *See table(s) above for measurements and observations. Electronically signed by Harold Barban MD on 03/10/2018 at 10:19:50 PM.    Final     Procedures Procedures (including critical care time)  Medications Ordered in ED Medications  HYDROcodone-acetaminophen (NORCO/VICODIN) 5-325 MG per tablet 1 tablet (1 tablet Oral Given 03/10/18 2350)     Initial Impression / Assessment and Plan / ED  Course  I have reviewed the triage vital signs and the nursing notes.  Pertinent labs & imaging results that were available during my care of the patient were reviewed by me and considered in my medical decision making (see chart for details).     58 year old female who presents for evaluation of right lower extremity pain.  She reports pain has been intermittent for the last 7 months.  She states that she has been seen here in the ED as well as orthopedics for pain with no conclusive findings.  She reports that last week, the pain is worsened.  She reports initially the pain began in her foot but now has gone through her entire leg.  She does report some intermittent swelling that she notes when she stands for long period of time.  The swelling gets better when she lays flat and elevates her legs.  She had one episode of numbness of the thigh but no other areas.  Denies any other numbness/weakness.  Patient reports that she has not had any fevers, chills.  She reports that since her cath in July, she has had intermittent sharp pains in the right shoulder, arm that radiates to the right chest.  States it does not feel like when she had a heart attack.  No associated nausea, vomiting, diaphoresis. Patient is afebrile, non-toxic appearing, sitting comfortably on examination table. Vital signs reviewed and stable.  Patient is neurovascularly intact.  Suspicion for DVT but given concerns for decreased mobility as well as continued pain, will plan for ultrasound.  History/physical exam not concerning for septic arthritis, acute arterial disease, cellulitis.  Additionally, history/physical exam, concerning for cauda equina, spinal abscess.  History/physical exam not concerning for CVA.  She does have positive straight leg raise and did report some pain to the posterior aspect of her buttock.  Suspect that there might be some component of sciatica with her pain.  Additionally, consider arthritis.  Patient not  currently having any chest pain or difficulty breathing but she did report an episode of sharp pain in that shoulder, right neck that went down into her right chest.  Sounds atypical for ACS but given her history, will plan to check labs.  History/physical exam not concerning for dissection.  BMP shows BUN is 23, creatinine 1.19.  Glucose is elevated at 286.  CBC shows leukocytosis 12.4.  Initial troponin negative.  DVT study negative for any acute abnormality.  Patient reports that when she did ultrasound, she had pain in the anterior aspect of her thigh into the inguinal and groin area.  Re-evaluation of that area showed no evidence of bony abnormality.  No evidence of hernia.  We will plan to check x-ray for evaluation of any arthritis.  Reviewed.  Negative for any acute bony abnormality.  Delta troponin is negative.  At this time, his story is not suspicious for ACS etiology.  Discussed results with patient.  Patient has been able to ambulate to  the bathroom in the ED with the assistance of a cane which is baseline for her. At this time, patient exhibits no emergent life-threatening condition that require further evaluation in ED or admissions.  Plan to treat as sciatica.  Patient given outpatient neurosurgery referral.  Patient had ample opportunity for questions and discussion. All patient's questions were answered with full understanding. Strict return precautions discussed. Patient expresses understanding and agreement to plan.    Portions of this note were generated with [employer]. Dictation errors may occur despite best attempts at proofreading.    Final Clinical Impressions(s) / ED Diagnoses   Final diagnoses:  Right leg pain    ED Discharge Orders         Ordered    lidocaine (LIDODERM) 5 %  Every 24 hours     03/10/18 2343    methocarbamol (ROBAXIN) 500 MG tablet  2 times daily     03/10/18 2343           Desma Mcgregor 03/11/18 2331    Quintella Reichert, MD 03/13/18 1032

## 2018-03-16 ENCOUNTER — Encounter: Payer: Self-pay | Admitting: Internal Medicine

## 2018-03-16 ENCOUNTER — Ambulatory Visit: Payer: Medicaid Other | Admitting: Internal Medicine

## 2018-03-16 VITALS — BP 130/80 | HR 87 | Ht 68.5 in | Wt 323.0 lb

## 2018-03-16 DIAGNOSIS — R0609 Other forms of dyspnea: Secondary | ICD-10-CM

## 2018-03-16 DIAGNOSIS — E039 Hypothyroidism, unspecified: Secondary | ICD-10-CM

## 2018-03-16 LAB — CBC WITH DIFFERENTIAL/PLATELET
Basophils Absolute: 0.1 10*3/uL (ref 0.0–0.1)
Basophils Relative: 0.6 % (ref 0.0–3.0)
Eosinophils Absolute: 0.2 10*3/uL (ref 0.0–0.7)
Eosinophils Relative: 1.7 % (ref 0.0–5.0)
HCT: 40.2 % (ref 36.0–46.0)
Hemoglobin: 13.2 g/dL (ref 12.0–15.0)
LYMPHS ABS: 3.6 10*3/uL (ref 0.7–4.0)
Lymphocytes Relative: 34.6 % (ref 12.0–46.0)
MCHC: 32.8 g/dL (ref 30.0–36.0)
MCV: 83.7 fl (ref 78.0–100.0)
Monocytes Absolute: 1 10*3/uL (ref 0.1–1.0)
Monocytes Relative: 9.5 % (ref 3.0–12.0)
Neutro Abs: 5.6 10*3/uL (ref 1.4–7.7)
Neutrophils Relative %: 53.6 % (ref 43.0–77.0)
PLATELETS: 337 10*3/uL (ref 150.0–400.0)
RBC: 4.8 Mil/uL (ref 3.87–5.11)
RDW: 15.4 % (ref 11.5–15.5)
WBC: 10.4 10*3/uL (ref 4.0–10.5)

## 2018-03-16 LAB — BASIC METABOLIC PANEL
BUN: 21 mg/dL (ref 6–23)
CO2: 24 mEq/L (ref 19–32)
Calcium: 8.8 mg/dL (ref 8.4–10.5)
Chloride: 102 mEq/L (ref 96–112)
Creatinine, Ser: 0.99 mg/dL (ref 0.40–1.20)
GFR: 69.76 mL/min (ref >60.00)
Glucose, Bld: 307 mg/dL — ABNORMAL HIGH (ref 70–99)
Potassium: 3.9 mEq/L (ref 3.5–5.1)
SODIUM: 136 meq/L (ref 135–145)

## 2018-03-16 LAB — TSH: TSH: 15.34 u[IU]/mL — AB (ref 0.35–4.50)

## 2018-03-16 LAB — D-DIMER, QUANTITATIVE: D-Dimer, Quant: 0.67 mcg/mL FEU — ABNORMAL HIGH (ref <0.50)

## 2018-03-16 LAB — BRAIN NATRIURETIC PEPTIDE: Pro B Natriuretic peptide (BNP): 18 pg/mL (ref 0.0–100.0)

## 2018-03-16 NOTE — Progress Notes (Signed)
[name], female    DOB: [birth year],    MRN: [id number]   Brief patient profile:  24 yobf  Quit smoking 2013 with MI  And did fine until July 2019  episode of CP /sob > LHC 08/19/17 nl LVgram/ LVEDP 16   Stent By Nadyne Coombes still sob but no CP on d/c then palpitations/cp different pattern from  from prior > neg w/u by [facility] > 2nd opinion  [facility] eval by [name] changed to plavix > doe not  better / palpitations better then gradual onset sporadic   new cp R neck rad to R parasternal x early Jan 2020 assoc worse cp/sob with walking,  worse with deep breathing and 3 episodes of brb maybe a tbsp total and  hemoptysis resolved by late Jan 2020 but sob/ cp did not so referred to pulmonary clinic 03/16/2018 by Dr   Vista Lawman     History of Present Illness  03/16/2018  Pulmonary/ 1st office eval/[facility]  Chief Complaint  Patient presents with  . Pulmonary Consult    Referred by Dr. Vista Lawman. Pt c/o CP and hemoptysis off and on since July 2019.  Last episode of hemoptysis was approx 3 wks ago. She states she gets SOB walking short distances such as from lobby to exam room today.   Dyspnea:  50 ft/ rides buggy at stores/ weight gain since stopped smoking// problem walking R hip pain rad to R foot Cough: none now  Sleep: on side lots of pillows  SABA use: none Cp x secs, comes on sitting/ starts in neck at insertion of sternocleido mastoids like a burning sensation shooting ant down r parasternal to xiphoid area never laterally.  Has lasted  maybe up a minute   R hip pain>  similar pattern >> eval otho /  [facility] injected 6 m prior to OV  And improved al lot transiently MRI of hip 07/15/17 does not support HNP or other explanation for pain     No obvious patterns in  day to day or daytime variability or assoc excess/ purulent sputum or mucus plugs or recurrent hemoptysis or  chest tightness, subjective wheeze or overt sinus or hb symptoms.   Sleeping  without nocturnal  or early am exacerbation  of respiratory   c/o's or need for noct saba. Also denies any obvious fluctuation of symptoms with weather or environmental changes or other aggravating or alleviating factors except as outlined above   No unusual exposure hx or h/o childhood pna/ asthma or knowledge of premature birth.  Current Allergies, Complete Past Medical History, Past Surgical History, Family History, and Social History were reviewed in [employer] record.  ROS  The following are not active complaints unless bolded Hoarseness, sore throat, dysphagia, dental problems, itching, sneezing,  nasal congestion or discharge of excess mucus or purulent secretions, ear ache,   fever, chills, sweats, unintended wt loss or wt gain, classically pleuritic or exertional cp,  orthopnea pnd or arm/hand swelling  or leg swelling, presyncope, palpitations improved , abdominal pain, anorexia, nausea, vomiting, diarrhea  or change in bowel habits or change in bladder habits, change in stools or change in urine, dysuria, hematuria,  rash, arthralgias, visual complaints, headache, numbness, weakness or ataxia or problems with walking or coordination,  change in mood or  memory.              Past Medical History:  Diagnosis Date  . ALLERGIC RHINITIS   . 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  Asthma    Questionable  . Diabetes mellitus, type 2 ([place])   . History of tobacco abuse    Quit 2011  . Hypertension   . Hypertriglyceridemia   . Hypothyroidism   . NSTEMI (non-ST elevated myocardial infarction) ([place]) 09/30/11   NO CAD by cath, ? coronary vasospasm  . Obesity     Outpatient Medications Prior to Visit  Medication Sig Dispense Refill  . aspirin EC 81 MG tablet Take 81 mg by mouth daily.    . clopidogrel (PLAVIX) 75 MG tablet Take 1 tablet by mouth daily.    Marland Kitchen glipiZIDE (GLUCOTROL) 5 MG tablet Take 5 mg by mouth 2 (two) times daily before a meal.    . isosorbide mononitrate (IMDUR) 30 MG 24 hr tablet Take 1 tablet (30 mg total) by mouth  daily. 30 tablet 6  . levothyroxine (SYNTHROID, LEVOTHROID) 300 MCG tablet Take 300 mcg by mouth daily.  4  . lidocaine (LIDODERM) 5 % Place 1 patch onto the skin daily. Remove & Discard patch within 12 hours or as directed by MD 30 patch 0  . methocarbamol (ROBAXIN) 500 MG tablet Take 1 tablet (500 mg total) by mouth 2 (two) times daily. 16 tablet 0  . metoprolol tartrate (LOPRESSOR) 25 MG tablet Take 0.5 tablets (12.5 mg total) by mouth 2 (two) times daily. 60 tablet 11  . MITIGARE 0.6 MG CAPS Take 1 capsule by mouth daily.    . nitroGLYCERIN (NITROSTAT) 0.4 MG SL tablet Place 1 tablet (0.4 mg total) under the tongue every 5 (five) minutes as needed for chest pain (up to 3 doses). 25 tablet 3  . NOVOLOG MIX 70/30 FLEXPEN (70-30) 100 UNIT/ML FlexPen Inject 12-16 Units into the skin 3 (three) times daily with meals. Sliding scale  2  . potassium chloride SA (K-DUR,KLOR-CON) 20 MEQ tablet Take 20 mEq by mouth as needed (muscle aches/cramps).     . rosuvastatin (CRESTOR) 5 MG tablet Take 5 mg by mouth daily.    Marland Kitchen triamterene-hydrochlorothiazide (MAXZIDE) 75-50 MG per tablet Take 0.5 tablets by mouth daily. 30 tablet 1  . bisacodyl (BISACODYL LAXATIVE) 10 MG suppository Place 1 suppository (10 mg total) rectally as needed for moderate constipation. 12 suppository 0  . Dulaglutide (TRULICITY) 1.5 VZ/8.5YI SOPN Inject into the skin.    Marland Kitchen ticagrelor (BRILINTA) 90 MG TABS tablet Take 1 tablet (90 mg total) by mouth 2 (two) times daily. 120 tablet 3      Objective:     BP 130/80 (BP Location: Left Arm, Cuff Size: Large)   Pulse 87   Ht 5' 8.5" (1.74 m)   Wt (!) 323 lb (146.5 kg)   SpO2 99%   BMI 48.40 kg/m   SpO2: 99 % RA     Obese bf nad walking with cane  HEENT: nl dentition, turbinates bilaterally, and oropharynx. Nl external ear canals without cough reflex   NECK :  without JVD/Nodes/TM/ nl carotid upstrokes bilaterally   LUNGS: no acc muscle use,  Nl contour chest which is clear  to A and P bilaterally without cough on insp or exp maneuvers   CV:  RRR  no s3 or murmur or increase in P2, and no edema   ABD: obese soft and nontender with nl inspiratory excursion in the supine position. No bruits or organomegaly appreciated, bowel sounds nl  MS:   ext warm without deformities, calf tenderness, cyanosis or clubbing No obvious joint restrictions / Pos R SLR   SKIN: warm  and dry without lesions    NEURO:  alert, approp, nl sensorium with  no motor or cerebellar deficits apparent.     I personally reviewed images and agree with radiology impression as follows:  CXR:   03/10/18 Negative.  No cardiopulmonary abnormality.   Labs ordered/ reviewed:      Chemistry      Component Value Date/Time   NA 136 03/16/2018 1043   K 3.9 03/16/2018 1043   CL 102 03/16/2018 1043   CO2 24 03/16/2018 1043   BUN 21 03/16/2018 1043   CREATININE 0.99 03/16/2018 1043      Component Value Date/Time   CALCIUM 8.8 03/16/2018 1043   ALKPHOS 103 10/03/2011 1818   AST 20 10/03/2011 1818   ALT 31 10/03/2011 1818   BILITOT 0.4 10/03/2011 1818        Lab Results  Component Value Date   WBC 10.4 03/16/2018   HGB 13.2 03/16/2018   HCT 40.2 03/16/2018   MCV 83.7 03/16/2018   PLT 337.0 03/16/2018       EOS                                                              0.2                                     03/16/2018   Lab Results  Component Value Date   DDIMER 0.67 (H) 03/16/2018      Lab Results  Component Value Date   TSH 15.34 (H) 03/16/2018     Lab Results  Component Value Date   PROBNP 18.0 03/16/2018                Assessment   DOE (dyspnea on exertion) Onset July 2019 no better p stent Nadyne Coombes) though assoc cp resolved - Spirometry 03/16/2018  FEV1 2.4 (94%)  Ratio 0.86 nl curvature off all rx - 03/16/2018   Walked RA x one lap =  approx 250 ft - stopped due to leg/back pain, no sob/ sats still 98% @ slow pace    Symptoms are markedly disproportionate  to objective findings and not clear to what extent this is actually a pulmonary  problem but pt does appear to have difficult to sort out respiratory symptoms of unknown origin for which  DDX  = almost all start with A and  include Adherence, Ace Inhibitors, Acid Reflux, Active Sinus Disease, Alpha 1 Antitripsin deficiency, Anxiety masquerading as Airways dz,  ABPA,  Allergy(esp in young), Aspiration (esp in elderly), Adverse effects of meds,  Active smoking or Vaping, A bunch of PE's/clot burden (a few small clots can't cause this syndrome unless there is already severe underlying pulm or vascular dz with poor reserve),  Anemia or thyroid disorder, plus two Bs  = Bronchiectasis and Beta blocker use..and one C= CHF     Adherence is always the initial "prime suspect" and is a multilayered concern that requires a "trust but verify" approach in every patient - starting with knowing how to use medications, especially inhalers, correctly, keeping up with refills and understanding the fundamental difference between maintenance and prns vs those medications only taken for a very short course and  then stopped and not refilled.   ? Anxiety/depression related to obesity deconditioning  > usually at the bottom of this list of usual suspects but should be much higher on this pt's based on H and P  may interfere with adherence and also interpretation of response or lack thereof to symptom management which can be quite subjective.   ? A bunch of PE's > very low pre test probability >  D dimer  high normal value (seen commonly in the elderly or chronically ill)  may miss small peripheral pe, the clot burden with sob is moderately high and the d dimer  has a very high neg pred value if used in this setting.    Anemia/ thryoid dz > labs c/w nl hgb/  Very high TSH (see separate a/p)   ? Beta blocker effects > very unlikely on such low doses of lopressor   ? CHF >   Note LV gram 08/19/17 nl ef/ nl lvedp with symptoms of  sob at rest/ nl bnp all strongly against chf    >>>>   Will f/u in 4 weeks with full pfts to complete the w/u for unexplained sob but no additional recs at this point  Discussed in detail all the  indications, usual  risks and alternatives  relative to the benefits with patient who agrees to proceed with w/u as outlined.         Morbid obesity (HCC) Body mass index is 48.4 kg/m.  -  Lab Results  Component Value Date   TSH 15.34 (H) 03/16/2018    Contributing to gerd risk/ doe/reviewed the need and the process to achieve and maintain neg calorie balance > defer f/u primary care including intermittently monitoring thyroid status       Acquired hypothyroidism Already on synthroid but doubt she's taking it correctly given tsh of 15.34 which can contribute to wt gain but not directly to doe, her chief concern >  So referred back  PCP to monitor/ adjust dose          Total time devoted to counseling  > 50 % of initial 60 min office visit:  review case with pt/directly observe walking sat study/  discussion of options/alternatives/ personally creating written customized instructions  in presence of pt  then going over those specific  Instructions directly with the pt including how to use all of the meds but in particular covering each new medication in detail and the difference between the maintenance= "automatic" meds and the prns using an action plan format for the latter (If this problem/symptom => do that organization reading Left to right).  Please see AVS from this visit for a full list of these instructions which I personally wrote for this pt and  are unique to this visit.      Christinia Gully, MD 03/16/2018

## 2018-03-16 NOTE — Assessment & Plan Note (Addendum)
Onset July 2019 no better p stent [name]) though assoc cp resolved - Spirometry 03/16/2018  FEV1 2.4 (94%)  Ratio 0.86 nl curvature off all rx - 03/16/2018   Walked RA x one lap =  approx 250 ft - stopped due to leg/back pain, no sob/ sats still 98% @ slow pace    Symptoms are markedly disproportionate to objective findings and not clear to what extent this is actually a pulmonary  problem but pt does appear to have difficult to sort out respiratory symptoms of unknown origin for which  DDX  = almost all start with A and  include Adherence, Ace Inhibitors, Acid Reflux, Active Sinus Disease, Alpha 1 Antitripsin deficiency, Anxiety masquerading as Airways dz,  ABPA,  Allergy(esp in young), Aspiration (esp in elderly), Adverse effects of meds,  Active smoking or Vaping, A bunch of PE's/clot burden (a few small clots can't cause this syndrome unless there is already severe underlying pulm or vascular dz with poor reserve),  Anemia or thyroid disorder, plus two Bs  = Bronchiectasis and Beta blocker use..and one C= CHF     Adherence is always the initial "prime suspect" and is a multilayered concern that requires a "trust but verify" approach in every patient - starting with knowing how to use medications, especially inhalers, correctly, keeping up with refills and understanding the fundamental difference between maintenance and prns vs those medications only taken for a very short course and then stopped and not refilled.   ? Anxiety/depression related to obesity deconditioning  > usually at the bottom of this list of usual suspects but should be much higher on this pt's based on H and P  may interfere with adherence and also interpretation of response or lack thereof to symptom management which can be quite subjective.   ? A bunch of PE's > very low pre test probability >  D dimer  high normal value (seen commonly in the elderly or chronically ill)  may miss small peripheral pe, the clot burden with sob is  moderately high and the d dimer  has a very high neg pred value if used in this setting.    Anemia/ thryoid dz > labs c/w nl hgb/  Very high TSH (see separate a/p)   ? Beta blocker effects > very unlikely on such low doses of lopressor   ? CHF >   Note LV gram 08/19/17 nl ef/ nl lvedp with symptoms of sob at rest/ nl bnp all strongly against chf    >>>>   Will f/u in 4 weeks with full pfts to complete the w/u for unexplained sob but no additional recs at this point  Discussed in detail all the  indications, usual  risks and alternatives  relative to the benefits with patient who agrees to proceed with w/u as outlined.

## 2018-03-16 NOTE — Patient Instructions (Addendum)
Please remember to go to the lab department  for your tests - we will call you with the results when they are available.      Please schedule a follow up office visit in 4 weeks, sooner if needed with full pfts

## 2018-03-16 NOTE — Progress Notes (Signed)
Spoke with pt and notified of results per Dr. Melvyn Novas. Pt verbalized understanding and denied any questions. She will f/u with PCP re her TSH. She states her dose was just recently increased and f/u is planned.

## 2018-03-17 ENCOUNTER — Encounter: Payer: Self-pay | Admitting: Internal Medicine

## 2018-03-17 DIAGNOSIS — E039 Hypothyroidism, unspecified: Secondary | ICD-10-CM | POA: Insufficient documentation

## 2018-03-17 NOTE — Assessment & Plan Note (Addendum)
Already on synthroid but doubt she's taking it correctly given tsh of 15.34 which can contribute to wt gain but not directly to doe, her chief concern >  So referred back  PCP to monitor/ adjust dose       Total time devoted to counseling  > 50 % of initial 60 min office visit:  review case with pt/directly observe walking sat study/  discussion of options/alternatives/ personally creating written customized instructions  in presence of pt  then going over those specific  Instructions directly with the pt including how to use all of the meds but in particular covering each new medication in detail and the difference between the maintenance= "automatic" meds and the prns using an action plan format for the latter (If this problem/symptom => do that organization reading Left to right).  Please see AVS from this visit for a full list of these instructions which I personally wrote for this pt and  are unique to this visit.

## 2018-03-17 NOTE — Assessment & Plan Note (Signed)
Body mass index is 48.4 kg/m.  -  Lab Results  Component Value Date   TSH 15.34 (H) 03/16/2018     Contributing to gerd risk/ doe/reviewed the need and the process to achieve and maintain neg calorie balance > defer f/u primary care including intermittently monitoring thyroid status

## 2018-03-19 ENCOUNTER — Other Ambulatory Visit: Payer: Self-pay | Admitting: Neurosurgery

## 2018-03-19 DIAGNOSIS — M4316 Spondylolisthesis, lumbar region: Secondary | ICD-10-CM

## 2018-04-02 ENCOUNTER — Ambulatory Visit
Admission: RE | Admit: 2018-04-02 | Discharge: 2018-04-02 | Disposition: A | Discharge status: Home / Self Care | Payer: Medicaid Other | Source: Ambulatory Visit | Attending: Neurosurgery | Admitting: Neurosurgery

## 2018-04-02 DIAGNOSIS — M4316 Spondylolisthesis, lumbar region: Secondary | ICD-10-CM

## 2018-04-15 NOTE — Progress Notes (Signed)
@Patient  ID: [name], female    DOB: July 29, 1960, 59 y.o.   MRN: [id number]  No chief complaint on file.   Referring provider: Gevena Mart, DO  HPI:  58 year old female former smoker referred to our office on 03/16/2018 by primary care for evaluation of dyspnea on exertion as well as 3 episodes of hemoptysis which resolved in January/2020  PMH: Hemoptysis Smoker/ Smoking History: Former smoker.  Quit 2013. 28.8 pack years.  Maintenance: None Pt of: Dr. Melvyn Novas  04/16/2018  - Visit   58 year old female former smoker (quit 2013) referred to pulmonary office again on 03/16/2018 from primary care for evaluation of dyspnea on exertion.  Patient completed pulmonary function testing today.  Pulmonary function testing results listed below:  04/16/2018-pulmonary function test-FVC 2.95 (88% predicted), postbronchodilator ratio 84, postbronchodilator FEV1 2.47 (94% predicted), no bronchodilator response, slight mid flow reversibility after bronchodilator, DLCO 89  Patient reports that she is not short of breath as last office visit.  She reports that this is been helping.  Patient recently received a cortisone shot for her sciatica and reports she is been more mobile which she believes has helped.  Patient does endorse that she has been having some morning headaches.  Patient also feels persistently fatigued.  She has never been screened for obstructive sleep apnea.  Epworth today is 14 Stop bang today is 6  See those results in the ROS.   Tests:   Spirometry 03/16/2018  FEV1 2.4 (94%)  Ratio 0.86 nl curvature off all rx  03/16/2018   Walked RA x one lap =  approx 250 ft - stopped due to leg/back pain, no sob/ sats still 98% @ slow pace    04/16/2018-pulmonary function test-FVC 2.95 (88% predicted), postbronchodilator ratio 84, postbronchodilator FEV1 2.47 (94% predicted), no bronchodilator response, slight mid flow reversibility after bronchodilator, DLCO 89   FENO:  No results found  for: NITRICOXIDE  PFT: No flowsheet data found.  Imaging: Mr Lumbar Spine Wo Contrast  Result Date: 04/02/2018 CLINICAL DATA:  Low back pain radiating to the right leg for the last year. EXAM: MRI LUMBAR SPINE WITHOUT CONTRAST TECHNIQUE: Multiplanar, multisequence MR imaging of the lumbar spine was performed. No intravenous contrast was administered. COMPARISON:  07/15/2017 FINDINGS: Segmentation: 5 lumbar type vertebral bodies as numbered previously. Alignment:  3 mm degenerative anterolisthesis L4-5. Vertebrae:  No fracture or primary bone lesion. Conus medullaris and cauda equina: Conus extends to the L1 level. Conus and cauda equina appear normal. Paraspinal and other soft tissues: Negative Disc levels: No abnormality at L3-4 or above. L4-5: Bilateral facet osteoarthritis with 3 mm of anterolisthesis. No disc bulge or herniation. Mild narrowing of the subarticular lateral recesses but without visible neural compression. Facet arthropathy could be a cause of back pain or referred facet syndrome pain. L5-S1 shows disc degeneration with bulging of the disc, centrally predominant. This does not appear to cause neural compression. There is bilateral facet osteoarthritis but without slippage. Findings at this level could contribute to back pain or referred facet syndrome pain. Compared to the study of June 2019, the findings are quite similar. IMPRESSION: L4-5 facet arthropathy with 3 mm of anterolisthesis. Some facet joint edema. This could be a cause of back pain or referred facet syndrome pain. Mild narrowing of the subarticular lateral recesses but without distinct neural compression. L5-S1 central disc bulge but without neural compression. Bilateral facet osteoarthritis which could be a cause of back pain or referred facet syndrome pain. Electronically Signed  By: Nelson Chimes M.D.   On: 04/02/2018 15:31      Specialty Problems      Pulmonary Problems   Bronchitis   Incidental lung nodule, > 14mm  and < 73mm    5.3 mm RUL nodule on CT 06/23/10 in long time smoker.      Pneumonia, bacterial    CT 06/23/10- left lung infiltrate      DOE (dyspnea on exertion)    Onset July 2019 no better p stent Nadyne [place]es) though assoc cp resolved - Spirometry 03/16/2018  FEV1 2.4 (94%)  Ratio 0.86 nl curvature off all rx - 03/16/2018   Walked RA x one lap =  approx 250 ft - stopped due to leg/back pain, no sob/ sats still 98% @ slow pace           Allergies  Allergen Reactions  . Contrast Media [Iodinated Diagnostic Agents] Swelling    Skin Peeling and break out  . Codeine Itching  . Other     NO PLASTIC TAPE     There is no immunization history on file for this patient.  Doesn't take flu shot ever Hasn't taken pneumonia   Past Medical History:  Diagnosis Date  . ALLERGIC RHINITIS   . 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 Asthma    Questionable  . Diabetes mellitus, type 2 ([place])   . History of tobacco abuse    Quit 2011  . Hypertension   . Hypertriglyceridemia   . Hypothyroidism   . NSTEMI (non-ST elevated myocardial infarction) ([place]) 09/30/11   NO CAD by cath, ? coronary vasospasm  . Obesity     Tobacco History: Social History   Tobacco Use  Smoking Status Former Smoker  . Packs/day: 0.80  . Years: 36.00  . Pack years: 28.80  . Types: Cigarettes  . Last attempt to quit: 02/05/2011  . Years since quitting: 7.1  Smokeless Tobacco Never Used   Counseling given: Yes  Continue to not smoke  Outpatient Encounter Medications as of 04/16/2018  Medication Sig  . aspirin EC 81 MG tablet Take 81 mg by mouth daily.  . clopidogrel (PLAVIX) 75 MG tablet Take 1 tablet by mouth daily.  Marland Kitchen glipiZIDE (GLUCOTROL) 5 MG tablet Take 5 mg by mouth 2 (two) times daily before a meal.  . isosorbide mononitrate (IMDUR) 30 MG 24 hr tablet Take 1 tablet (30 mg total) by mouth daily.  Marland Kitchen levothyroxine (SYNTHROID, LEVOTHROID) 300 MCG tablet Take 300 mcg by mouth daily.  Marland Kitchen lidocaine (LIDODERM) 5 % Place 1 patch  onto the skin daily. Remove & Discard patch within 12 hours or as directed by MD  . methocarbamol (ROBAXIN) 500 MG tablet Take 1 tablet (500 mg total) by mouth 2 (two) times daily.  . metoprolol tartrate (LOPRESSOR) 25 MG tablet Take 0.5 tablets (12.5 mg total) by mouth 2 (two) times daily.  Marland Kitchen MITIGARE 0.6 MG CAPS Take 1 capsule by mouth daily.  . nitroGLYCERIN (NITROSTAT) 0.4 MG SL tablet Place 1 tablet (0.4 mg total) under the tongue every 5 (five) minutes as needed for chest pain (up to 3 doses).  . NOVOLOG MIX 70/30 FLEXPEN (70-30) 100 UNIT/ML FlexPen Inject 12-16 Units into the skin 3 (three) times daily with meals. Sliding scale  . potassium chloride SA (K-DUR,KLOR-CON) 20 MEQ tablet Take 20 mEq by mouth as needed (muscle aches/cramps).   . rosuvastatin (CRESTOR) 5 MG tablet Take 5 mg by mouth daily.  Marland Kitchen triamterene-hydrochlorothiazide (MAXZIDE) 75-50 MG per  tablet Take 0.5 tablets by mouth daily.   No facility-administered encounter medications on file as of 04/16/2018.      Review of Systems  Review of Systems  Constitutional: Positive for fatigue. Negative for chills, fever and unexpected weight change.  HENT: Negative for congestion, postnasal drip, sinus pressure and sinus pain.   Respiratory: Positive for shortness of breath (Improved). Negative for cough, chest tightness and wheezing.   Cardiovascular: Negative for chest pain and palpitations.  Gastrointestinal: Negative for blood in stool, diarrhea, nausea and vomiting.  Musculoskeletal: Negative for arthralgias.  Skin: Negative for color change.  Allergic/Immunologic: Negative for environmental allergies and food allergies.  Neurological: Positive for headaches (Morning headaches). Negative for dizziness and light-headedness.  Psychiatric/Behavioral: Positive for sleep disturbance (Wakes up fatigued, does not feel well rested). Negative for dysphoric mood. The patient is not nervous/anxious.   All other systems reviewed and  are negative.    Results of the Epworth flowsheet 04/16/2018  Sitting and reading 2  Watching TV 3  Sitting, inactive in a public place (e.g. a theatre or a meeting) 2  As a passenger in a car for an hour without a break 1  Lying down to rest in the afternoon when circumstances permit 3  Sitting and talking to someone 1  Sitting quietly after a lunch without alcohol 2  In a car, while stopped for a few minutes in traffic 0  Total score 14   STOP BANG questionnaire  Snoring? yes Tiredness? yes Observed apneas? unsure Elevated blood pressure? yes BMI greater than 35? yes Age greater than 50? yes Neck circumference greater than 40 cm? Yes, 41cm Female gender? No   Scoring:  One-point is signed for each.   0-2 equals low risk, 3-4 equals intermediate risk, those with 5 or greater high risk for having obstructive sleep apnea  Score: 6   Physical Exam  BP 138/78 (BP Location: Left Arm, Patient Position: Sitting, Cuff Size: Large)   Pulse 85   Resp 18   Ht 5\' 9"  (1.753 m)   Wt (!) 322 lb (146.1 kg)   BMI 47.55 kg/m   Wt Readings from Last 5 Encounters:  04/16/18 (!) 322 lb (146.1 kg)  03/16/18 (!) 323 lb (146.5 kg)  10/03/17 (!) 317 lb (143.8 kg)  08/20/17 (!) 327 lb 1.6 oz (148.4 kg)  07/10/17 (!) 324 lb (147 kg)    Physical Exam  Constitutional: She is oriented to person, place, and time and well-developed, well-nourished, and in no distress. No distress.  Morbidly obese adult female  HENT:  Head: Normocephalic and atraumatic.  Right Ear: Hearing, tympanic membrane, external ear and ear canal normal.  Left Ear: Hearing, tympanic membrane, external ear and ear canal normal.  Nose: Mucosal edema present. Right sinus exhibits no maxillary sinus tenderness and no frontal sinus tenderness. Left sinus exhibits no maxillary sinus tenderness and no frontal sinus tenderness.  Mouth/Throat: Uvula is midline and oropharynx is clear and moist. No oropharyngeal exudate.   Mallampati 1  Eyes: Pupils are equal, round, and reactive to light.  Neck: Normal range of motion. Neck supple.  Cardiovascular: Normal rate, regular rhythm and normal heart sounds.  Pulmonary/Chest: Effort normal and breath sounds normal. No accessory muscle usage. No respiratory distress. She has no decreased breath sounds. She has no wheezes. She has no rhonchi.  Musculoskeletal: Normal range of motion.        General: No edema.  Lymphadenopathy:    She has no cervical adenopathy.  Neurological: She is alert and oriented to person, place, and time. Gait normal.  Skin: Skin is warm and dry. She is not diaphoretic. No erythema.  Psychiatric: Mood, memory, affect and judgment normal.  Nursing note and vitals reviewed.     Lab Results:  CBC    Component Value Date/Time   WBC 10.4 03/16/2018 1043   RBC 4.80 03/16/2018 1043   HGB 13.2 03/16/2018 1043   HCT 40.2 03/16/2018 1043   PLT 337.0 03/16/2018 1043   MCV 83.7 03/16/2018 1043   MCH 27.7 03/10/2018 1846   MCHC 32.8 03/16/2018 1043   RDW 15.4 03/16/2018 1043   LYMPHSABS 3.6 03/16/2018 1043   MONOABS 1.0 03/16/2018 1043   EOSABS 0.2 03/16/2018 1043   BASOSABS 0.1 03/16/2018 1043    BMET    Component Value Date/Time   NA 136 03/16/2018 1043   K 3.9 03/16/2018 1043   CL 102 03/16/2018 1043   CO2 24 03/16/2018 1043   GLUCOSE 307 (H) 03/16/2018 1043   BUN 21 03/16/2018 1043   CREATININE 0.99 03/16/2018 1043   CALCIUM 8.8 03/16/2018 1043   GFRNONAA 51 (L) 03/10/2018 1846   GFRAA 59 (L) 03/10/2018 1846    BNP No results found for: BNP  ProBNP    Component Value Date/Time   PROBNP 18.0 03/16/2018 1043      Assessment & Plan:      Morbid obesity ([place]) Assessment: BMI 47.5  Plan: Continue to work aggressively on increasing activity losing weight Referral to medical weight management We will order sleep study for you  At risk for obstructive sleep apnea Assessment: Epworth score today 14 Stop bang  today 6 Mallampati 1 Neck circumference 41 cm Postmenopausal female BMI 47.5 Patient endorsing morning headaches  Plan: Sleep study ordered today  DOE (dyspnea on exertion) Assessment: Normal spirometry on exam today Last office visit spirometry normal Smoking history of 28 pack years Patient reports that dyspnea has improved since last office visit BMI 47.5  Plan: Continue to work on physical activity and losing weight I believe your shortness of breath is likely related to your sedentary lifestyle as well as morbid obesity Referral to medical weight management sleep study ordered to evaluate you for obstructive sleep apnea Follow-up with Dr. Melvyn Novas in 3 months     Lauraine Rinne, NP 04/16/2018   This appointment was 30 min long with over 50% of the time in direct face-to-face patient care, assessment, plan of care, and follow-up.

## 2018-04-16 ENCOUNTER — Telehealth: Payer: Self-pay | Admitting: Pulmonary Disease

## 2018-04-16 ENCOUNTER — Ambulatory Visit (INDEPENDENT_AMBULATORY_CARE_PROVIDER_SITE_OTHER): Payer: Medicaid Other | Admitting: Pulmonary Disease

## 2018-04-16 ENCOUNTER — Ambulatory Visit (INDEPENDENT_AMBULATORY_CARE_PROVIDER_SITE_OTHER): Payer: Medicaid Other | Admitting: Internal Medicine

## 2018-04-16 ENCOUNTER — Other Ambulatory Visit: Payer: Self-pay

## 2018-04-16 ENCOUNTER — Encounter: Payer: Self-pay | Admitting: Pulmonary Disease

## 2018-04-16 DIAGNOSIS — Z9189 Other specified personal risk factors, not elsewhere classified: Secondary | ICD-10-CM

## 2018-04-16 DIAGNOSIS — R0609 Other forms of dyspnea: Secondary | ICD-10-CM

## 2018-04-16 LAB — PULMONARY FUNCTION TEST
DL/VA % pred: 111 %
DL/VA: 4.56 ml/min/mmHg/L
DLCO cor % pred: 90 %
DLCO cor: 21.75 ml/min/mmHg
DLCO unc % pred: 89 %
DLCO unc: 21.62 ml/min/mmHg
FEF 25-75 POST: 2.81 L/s
FEF 25-75 Pre: 2.42 L/sec
FEF2575-%Change-Post: 15 %
FEF2575-%Pred-Post: 110 %
FEF2575-%Pred-Pre: 94 %
FEV1-%Change-Post: 3 %
FEV1-%Pred-Post: 94 %
FEV1-%Pred-Pre: 90 %
FEV1-Post: 2.47 L
FEV1-Pre: 2.39 L
FEV1FVC-%Change-Post: 3 %
FEV1FVC-%Pred-Pre: 101 %
FEV6-%Change-Post: 2 %
FEV6-%Pred-Post: 91 %
FEV6-%Pred-Pre: 89 %
FEV6-Post: 2.95 L
FEV6-Pre: 2.89 L
FEV6FVC-%Change-Post: 1 %
FEV6FVC-%Pred-Post: 102 %
FEV6FVC-%Pred-Pre: 101 %
FVC-%Change-Post: 0 %
FVC-%Pred-Post: 88 %
FVC-%Pred-Pre: 88 %
FVC-Post: 2.95 L
FVC-Pre: 2.95 L
Post FEV1/FVC ratio: 84 %
Post FEV6/FVC ratio: 100 %
Pre FEV1/FVC ratio: 81 %
Pre FEV6/FVC Ratio: 98 %
RV % pred: 81 %
RV: 1.78 L
TLC % pred: 83 %
TLC: 4.85 L

## 2018-04-16 NOTE — Patient Instructions (Addendum)
Continue to work diligently on losing weight and exercising  Continue to follow a healthy diet  Referral to medical weight management placed today  Order for home sleep study ordered >>>to rule out obstructive sleep apnea  Follow-up with Dr. Melvyn Novas in 3 months     It is flu season:   >>> Best ways to protect herself from the flu: Receive the yearly flu vaccine, practice good hand hygiene washing with soap and also using hand sanitizer when available, eat a nutritious meals, get adequate rest, hydrate appropriately   Please contact the office if your symptoms worsen or you have concerns that you are not improving.   Thank you for choosing [facility] for your healthcare, and for allowing [place] to partner with you on your healthcare journey. I am thankful to be able to provide care to you today.   Wyn Quaker FNP-C    Sleep Apnea Sleep apnea is a condition in which breathing pauses or becomes shallow during sleep. Episodes of sleep apnea usually last 10 seconds or longer, and they may occur as many as 20 times an hour. Sleep apnea disrupts your sleep and keeps your body from getting the rest that it needs. This condition can increase your risk of certain health problems, including:  Heart attack.  Stroke.  Obesity.  Diabetes.  Heart failure.  Irregular heartbeat. There are three kinds of sleep apnea:  Obstructive sleep apnea. This kind is caused by a blocked or collapsed airway.  Central sleep apnea. This kind happens when the part of the brain that controls breathing does not send the correct signals to the muscles that control breathing.  Mixed sleep apnea. This is a combination of obstructive and central sleep apnea. What are the causes? The most common cause of this condition is a collapsed or blocked airway. An airway can collapse or become blocked if:  Your throat muscles are abnormally relaxed.  Your tongue and tonsils are larger than normal.  You are  overweight.  Your airway is smaller than normal. What increases the risk? This condition is more likely to develop in people who:  Are overweight.  Smoke.  Have a smaller than normal airway.  Are elderly.  Are female.  Drink alcohol.  Take sedatives or tranquilizers.  Have a family history of sleep apnea. What are the signs or symptoms? Symptoms of this condition include:  Trouble staying asleep.  Daytime sleepiness and tiredness.  Irritability.  Loud snoring.  Morning headaches.  Trouble concentrating.  Forgetfulness.  Decreased interest in sex.  Unexplained sleepiness.  Mood swings.  Personality changes.  Feelings of depression.  Waking up often during the night to urinate.  Dry mouth.  Sore throat. How is this diagnosed? This condition may be diagnosed with:  A medical history.  A physical exam.  A series of tests that are done while you are sleeping (sleep study). These tests are usually done in a sleep lab, but they may also be done at home. How is this treated? Treatment for this condition aims to restore normal breathing and to ease symptoms during sleep. It may involve managing health issues that can affect breathing, such as high blood pressure or obesity. Treatment may include:  Sleeping on your side.  Using a decongestant if you have nasal congestion.  Avoiding the use of depressants, including alcohol, sedatives, and narcotics.  Losing weight if you are overweight.  Making changes to your diet.  Quitting smoking.  Using a device to open your  airway while you sleep, such as: ? An oral appliance. This is a custom-made mouthpiece that shifts your lower jaw forward. ? A continuous positive airway pressure (CPAP) device. This device delivers oxygen to your airway through a mask. ? A nasal expiratory positive airway pressure (EPAP) device. This device has valves that you put into each nostril. ? A bi-level positive airway pressure  (BPAP) device. This device delivers oxygen to your airway through a mask.  Surgery if other treatments do not work. During surgery, excess tissue is removed to create a wider airway. It is important to get treatment for sleep apnea. Without treatment, this condition can lead to:  High blood pressure.  Coronary artery disease.  (Men) An inability to achieve or maintain an erection (impotence).  Reduced thinking abilities. Follow these instructions at home:  Make any lifestyle changes that your health care provider recommends.  Eat a healthy, well-balanced diet.  Take over-the-counter and prescription medicines only as told by your health care provider.  Avoid using depressants, including alcohol, sedatives, and narcotics.  Take steps to lose weight if you are overweight.  If you were given a device to open your airway while you sleep, use it only as told by your health care provider.  Do not use any tobacco products, such as cigarettes, chewing tobacco, and e-cigarettes. If you need help quitting, ask your health care provider.  Keep all follow-up visits as told by your health care provider. This is important. Contact a health care provider if:  The device that you received to open your airway during sleep is uncomfortable or does not seem to be working.  Your symptoms do not improve.  Your symptoms get worse. Get help right away if:  You develop chest pain.  You develop shortness of breath.  You develop discomfort in your back, arms, or stomach.  You have trouble speaking.  You have weakness on one side of your body.  You have drooping in your face. These symptoms may represent a serious problem that is an emergency. Do not wait to see if the symptoms will go away. Get medical help right away. Call your local emergency services (911 in the [place] Do not drive yourself to the hospital. This information is not intended to replace advice given to you by your health care  provider. Make sure you discuss any questions you have with your health care provider. Document Released: 01/11/2002 Document Revised: 08/19/2016 Document Reviewed: 10/31/2014 Elsevier Interactive Patient Education  2019 [place] Risks of Being Overweight Maintaining a healthy body weight is an important part of your overall health. Your healthy body weight depends on your age, gender, and height. Being overweight puts you at risk for many health problems, including:  Heart disease.  Diabetes.  Problems sleeping.  Joint problems. You can make changes to your diet and lifestyle to prevent these risks. Consider working with a health care provider or a dietitian to make these changes. What nutrition changes can be made?   Eat only as much as your body needs. In most cases, this is about 2,000 calories a day, but the amount varies depending on your height, gender, and activity level. Ask your health care provider how many calories you should have each day. Eating more than your body needs on a regular basis can cause you to become overweight or obese.  Eat slowly, and stop eating when you feel full.  Choose healthy foods, including: ? Fruits and vegetables. ?  Lean meats. ? Low-fat dairy products. ? High-fiber foods, such as whole grains and beans. ? Healthy snacks like vegetable sticks, a piece of fruit, or a small amount of yogurt or cheese.  Avoid foods and drinks that are high in sugar, salt (sodium), saturated fat, or trans fat. This includes: ? Many desserts such as candy, cookies, and ice cream. ? Soda. ? Fried foods. ? Processed meats such as hot dogs or lunch meats. ? Prepackaged snack foods. What lifestyle changes can be made?   Exercise for at least 150 minutes a week to prevent weight gain, or as often as recommended by your health care provider. Do moderate-intensity exercise, such as brisk walking. ? Spread it out by exercising for 30  minutes 5 days a week, or in short 10-minute bursts several times a day.  Find other ways to stay active and burn calories, such as yard work or a hobby that involves physical activity.  Get at least 8 hours of sleep each night. When you are well-rested, you are more likely to be active and make healthy choices during the day. To sleep better: ? Try to go to bed and wake up at about the same time every day. ? Keep your bedroom dark, quiet, and cool. ? Make sure that your bed is comfortable. ? Avoid stimulating activities, such as watching television or exercising, for at least one hour before bedtime. Why are these changes important? Eating healthy and being active helps you lose weight and prevent health problems caused by being overweight. Making these changes can also help you manage stress, feel better mentally, and connect with friends and family. What can happen if changes are not made? Being overweight can affect you for your entire life. You may develop joint or bone problems that make it painful or difficult for you to play sports or do activities you enjoy. Being overweight puts stress on your heart and lungs and can lead to medical problems like diabetes, heart disease, and sleeping problems. Where to find support You can get support for preventing health risks of being overweight from:  Your health care provider or a dietitian. They can provide guidance about healthy eating and healthy lifestyle choices.  Weight loss support groups, online or in-person. Where to find more information  MyPlate: FormerBoss.no ? This an online tool that provides personalized recommendations about foods to eat each day.  The Centers for Disease Control and Prevention: http://sharp-hammond.biz/ ? This resource gives tips for managing weight and having an active lifestyle. Summary  To prevent unhealthy weight gain, it is important to maintain a healthy diet high in vegetables and whole  grains, exercise regularly, and get at least 8 hours of sleep each night.  Making these changes helps prevent many long-term (chronic) health conditions that can shorten your life, such as diabetes, heart disease, and stroke. This information is not intended to replace advice given to you by your health care provider. Make sure you discuss any questions you have with your health care provider. Document Released: 12/18/2016 Document Revised: 12/18/2016 Document Reviewed: 12/18/2016 Elsevier Interactive Patient Education  2019 [employer].

## 2018-04-16 NOTE — Assessment & Plan Note (Addendum)
Assessment: Epworth score today 14 Stop bang today 6 Mallampati 1 Neck circumference 41 cm Postmenopausal female BMI 47.5 Patient endorsing morning headaches  Plan: Sleep study ordered today

## 2018-04-16 NOTE — Telephone Encounter (Signed)
Called and spoke with Marya Amsler in IT today at phone [phone] He advised that he was able to link patient's PFT from [place] to [place] today Epic was out of service this morning, but is up and running now [place] verbalized that all is corrected with patient's chart Nothing further needed.

## 2018-04-16 NOTE — Progress Notes (Signed)
PFT done today. 

## 2018-04-16 NOTE — Assessment & Plan Note (Addendum)
Assessment: Normal spirometry on exam today Last office visit spirometry normal Smoking history of 28 pack years Patient reports that dyspnea has improved since last office visit BMI 47.5  Plan: Continue to work on physical activity and losing weight I believe your shortness of breath is likely related to your sedentary lifestyle as well as morbid obesity Referral to medical weight management sleep study ordered to evaluate you for obstructive sleep apnea Follow-up with Dr. Melvyn Novas in 3 months

## 2018-04-16 NOTE — Assessment & Plan Note (Addendum)
Assessment: BMI 47.5  Plan: Continue to work aggressively on increasing activity losing weight Referral to medical weight management We will order sleep study for you

## 2018-04-21 NOTE — Progress Notes (Signed)
Chart and office note reviewed in detail  > agree with a/p as outlined    

## 2018-06-04 ENCOUNTER — Encounter (HOSPITAL_BASED_OUTPATIENT_CLINIC_OR_DEPARTMENT_OTHER): Payer: Medicaid Other

## 2018-07-20 ENCOUNTER — Ambulatory Visit: Payer: Medicaid Other | Admitting: Internal Medicine

## 2018-07-23 ENCOUNTER — Other Ambulatory Visit (HOSPITAL_COMMUNITY): Payer: Medicaid Other

## 2018-08-02 ENCOUNTER — Encounter (HOSPITAL_BASED_OUTPATIENT_CLINIC_OR_DEPARTMENT_OTHER): Payer: Medicaid Other

## 2018-08-14 ENCOUNTER — Other Ambulatory Visit (HOSPITAL_COMMUNITY)
Admission: RE | Admit: 2018-08-14 | Discharge: 2018-08-14 | Disposition: A | Discharge status: Home / Self Care | Payer: Medicaid Other | Source: Ambulatory Visit | Attending: Pulmonary Disease | Admitting: Pulmonary Disease

## 2018-08-14 DIAGNOSIS — Z01812 Encounter for preprocedural laboratory examination: Secondary | ICD-10-CM | POA: Insufficient documentation

## 2018-08-14 DIAGNOSIS — Z1159 Encounter for screening for other viral diseases: Secondary | ICD-10-CM | POA: Insufficient documentation

## 2018-08-14 LAB — SARS CORONAVIRUS 2 (TAT 6-24 HRS): SARS Coronavirus 2: NEGATIVE

## 2018-08-17 ENCOUNTER — Ambulatory Visit (HOSPITAL_BASED_OUTPATIENT_CLINIC_OR_DEPARTMENT_OTHER): Payer: Medicaid Other | Attending: Pulmonary Disease | Admitting: Pulmonary Disease

## 2018-08-17 ENCOUNTER — Other Ambulatory Visit: Payer: Self-pay

## 2018-08-17 DIAGNOSIS — I10 Essential (primary) hypertension: Secondary | ICD-10-CM | POA: Diagnosis not present

## 2018-08-17 DIAGNOSIS — Z9189 Other specified personal risk factors, not elsewhere classified: Secondary | ICD-10-CM | POA: Diagnosis present

## 2018-08-17 DIAGNOSIS — Z6841 Body Mass Index (BMI) 40.0 and over, adult: Secondary | ICD-10-CM | POA: Diagnosis not present

## 2018-08-17 DIAGNOSIS — G4733 Obstructive sleep apnea (adult) (pediatric): Secondary | ICD-10-CM | POA: Insufficient documentation

## 2018-08-17 DIAGNOSIS — E669 Obesity, unspecified: Secondary | ICD-10-CM | POA: Insufficient documentation

## 2018-08-17 DIAGNOSIS — E119 Type 2 diabetes mellitus without complications: Secondary | ICD-10-CM | POA: Insufficient documentation

## 2018-08-17 DIAGNOSIS — I493 Ventricular premature depolarization: Secondary | ICD-10-CM | POA: Insufficient documentation

## 2018-08-25 DIAGNOSIS — G4733 Obstructive sleep apnea (adult) (pediatric): Secondary | ICD-10-CM | POA: Diagnosis not present

## 2018-08-25 NOTE — Procedures (Signed)
Patient Name: [name], [name] Study Date: 08/17/2018 Gender: Female D.O.B: [birth year] Age (years): 59 Referring Provider: Lauraine Rinne NP Height (inches): 90 Interpreting Physician: Kara Mead MD, ABSM Weight (lbs): 311 RPSGT: Laren Everts BMI: 47 MRN: [id number] Neck Size: 18.00 <br> <br> CLINICAL INFORMATION Sleep Study Type: Split Night CPAP  Indication for sleep study: Diabetes, Hypertension, Obesity, Snoring, Witnessed Apneas  Epworth Sleepiness Score: 7  SLEEP STUDY TECHNIQUE As per the AASM Manual for the Scoring of Sleep and Associated Events v2.3 (April 2016) with a hypopnea requiring 4% desaturations.  The channels recorded and monitored were frontal, central and occipital EEG, electrooculogram (EOG), submentalis EMG (chin), nasal and oral airflow, thoracic and abdominal wall motion, anterior tibialis EMG, snore microphone, electrocardiogram, and pulse oximetry. Continuous positive airway pressure (CPAP) was initiated when the patient met split night criteria and was titrated according to treat sleep-disordered breathing.  MEDICATIONS Medications self-administered by patient taken the night of the study : N/A  RESPIRATORY PARAMETERS Diagnostic  Total AHI (/hr): 35.1 RDI (/hr): 41.0 OA Index (/hr): 1.3 CA Index (/hr): 1.7 REM AHI (/hr): 81.3 NREM AHI (/hr): 29.4 Supine AHI (/hr): 53.3 Non-supine AHI (/hr): 25.7 Min O2 Sat (%): 75.0 Mean O2 (%): 92.8 Time below 88% (min): 10.4   Titration  Optimal Pressure (cm): 11 AHI at Optimal Pressure (/hr): 18.1 Min O2 at Optimal Pressure (%): 90.0 Supine % at Optimal (%): 100 Sleep % at Optimal (%): 50   SLEEP ARCHITECTURE The recording time for the entire night was 396.9 minutes.  During a baseline period of 199.5 minutes, the patient slept for 141.9 minutes in REM and nonREM, yielding a sleep efficiency of 71.1%%. Sleep onset after lights out was 21.1 minutes with a REM latency of 134.5 minutes. The patient spent 9.9%%  of the night in stage N1 sleep, 79.2%% in stage N2 sleep, 0.0%% in stage N3 and 10.9% in REM.    During the titration period of 192.6 minutes, the patient slept for 139.5 minutes in REM and nonREM, yielding a sleep efficiency of 72.4%%. Sleep onset after CPAP initiation was 19.7 minutes with a REM latency of 95.5 minutes. The patient spent 10.4%% of the night in stage N1 sleep, 67.7%% in stage N2 sleep, 0.0%% in stage N3 and 21.9% in REM.  CARDIAC DATA The 2 lead EKG demonstrated sinus rhythm. The mean heart rate was 100.0 beats per minute. Other EKG findings include: PVCs.   LEG MOVEMENT DATA The total Periodic Limb Movements of Sleep (PLMS) were 0. The PLMS index was 0.0 .  IMPRESSIONS - Severe obstructive sleep apnea occurred during the diagnostic portion of the study (AHI = 35.1/hour). An optimal PAP pressure was selected for this patient ( 11 cm of water). Few hypopneas were noted at this final CPAP level - No significant central sleep apnea occurred during the diagnostic portion of the study (CAI = 1.7/hour). - Moderate oxygen desaturation was noted during the diagnostic portion of the study (Min O2 =75.0%). - The patient snored with moderate snoring volume during the diagnostic portion of the study. - EKG findings include PVCs. - Clinically significant periodic limb movements did not occur during sleep.  DIAGNOSIS - Obstructive Sleep Apnea (327.23 [G47.33 ICD-10])   RECOMMENDATIONS - Trial of CPAP therapy on 11 cm H2O with a Small size Resmed Full Face Mask AirFit F20 mask and heated humidification. - Avoid alcohol, sedatives and other CNS depressants that may worsen sleep apnea and disrupt normal sleep architecture. - Sleep hygiene should be reviewed  to assess factors that may improve sleep quality. - Weight management and regular exercise should be initiated or continued. - Return to [facility] for re-evaluation after 4 weeks of therapy   Kara Mead MD Board Certified in  [place]

## 2018-08-25 NOTE — Progress Notes (Signed)
Patient sleep study results show severe obstructive sleep apnea.  Sleep study recommending CPAP start.  Please contact the patient.  If patient is agreeable to starting CPAP therapy we can place an order for her to start this.  If patient has additional questions or would like to talk further regarding these results please schedule for video visit or tele-visit to discuss with me.  If patient agrees to proceed forward with CPAP start please place the order for:  New CPAP start >>>CPAP set pressure of 11, mask of choice (sleep study results recommend small size ResMed full facemask air fit F 20 mask and heated humidification) DME of choice Follow-up with office in 2 to 3 months after CPAP start so I can see 30-day compliance to CPAP therapy.  Wyn Quaker, FNP

## 2018-08-26 ENCOUNTER — Other Ambulatory Visit: Payer: Self-pay | Admitting: General Surgery

## 2018-08-26 DIAGNOSIS — G4733 Obstructive sleep apnea (adult) (pediatric): Secondary | ICD-10-CM

## 2018-08-26 NOTE — Progress Notes (Signed)
See prev result note   B

## 2018-11-03 ENCOUNTER — Ambulatory Visit: Payer: Medicaid Other | Admitting: Internal Medicine

## 2018-11-09 ENCOUNTER — Other Ambulatory Visit: Payer: Self-pay

## 2018-11-09 ENCOUNTER — Encounter: Payer: Self-pay | Admitting: Pulmonary Disease

## 2018-11-09 ENCOUNTER — Ambulatory Visit: Payer: Medicaid Other | Admitting: Pulmonary Disease

## 2018-11-09 VITALS — BP 122/78 | HR 85 | Temp 97.6°F | Ht 68.0 in | Wt 306.2 lb

## 2018-11-09 DIAGNOSIS — Z0000 Encounter for general adult medical examination without abnormal findings: Secondary | ICD-10-CM | POA: Insufficient documentation

## 2018-11-09 DIAGNOSIS — G4733 Obstructive sleep apnea (adult) (pediatric): Secondary | ICD-10-CM | POA: Diagnosis not present

## 2018-11-09 NOTE — Progress Notes (Signed)
@Patient  ID: [name], female    DOB: 20-Jul-1960, 58 y.o.   MRN: [id number]  Chief Complaint  Patient presents with  . Follow-up    F/U on cpap machine.     Referring provider: Gevena Mart, DO  HPI:  58 year old female former smoker referred to our office on 03/16/2018 by primary care for evaluation of dyspnea on exertion as well as 3 episodes of hemoptysis which resolved in January/2020  PMH: Hemoptysis Smoker/ Smoking History: Former smoker.  Quit 2013. 28.8 pack years.  Maintenance: None Pt of: Dr. Melvyn Novas  11/09/2018  - Visit   58 year old female former smoker initially referred to our office in February/2020 for evaluation of dyspnea.  Patient also had history of hemoptysis.  Pulmonary function testing has been unremarkable.  Epworth in March was 14. Patient did complete a sleep study.  Sleep study in July showed severe obstructive sleep apnea.  Patient was started on CPAP therapy.  Patient presents today to review CPAP therapy as well as to discuss getting established with a sleep MD.  CPAP compliance report shows adequate compliance.  See compliance report listed below:  10/07/2018-11/05/2018-CPAP compliance report- 25 had a last 30 days used, 19 of those days greater than 4 hours, average usage 4 hours and 59 minutes, CPAP set pressure of 11, 95th percentile 14.1, AHI 3.1  Overall, patient is reporting today that she feels much better since starting CPAP therapy.  Patient feels more rested.  She feels that she is able to think clearly during the day.  She enjoys using it.  She feels that the pressure setting is appropriate and she reports that she feels like it is "natural breathing".  She has the app which she routinely reviews and follows.  She is getting good seal reports on the app itself.  Patient has not received a flu vaccine, she declined it today.  Patient has not received the pneumonia vaccine, she reports she will think about receiving it.  She has not had any more  episodes of hemoptysis.    Questionaires / Pulmonary Flowsheets:   Epworth:  Results of the Epworth flowsheet 04/16/2018  Sitting and reading 2  Watching TV 3  Sitting, inactive in a public place (e.g. a theatre or a meeting) 2  As a passenger in a car for an hour without a break 1  Lying down to rest in the afternoon when circumstances permit 3  Sitting and talking to someone 1  Sitting quietly after a lunch without alcohol 2  In a car, while stopped for a few minutes in traffic 0  Total score 14    Tests:   Spirometry 03/16/2018  FEV1 2.4 (94%)  Ratio 0.86 nl curvature off all rx  03/16/2018   Walked RA x one lap =  approx 250 ft - stopped due to leg/back pain, no sob/ sats still 98% @ slow pace    04/16/2018-pulmonary function test-FVC 2.95 (88% predicted), postbronchodilator ratio 84, postbronchodilator FEV1 2.47 (94% predicted), no bronchodilator response, slight mid flow reversibility after bronchodilator, DLCO 89  08/17/2018-split-night sleep study- severe obstructive sleep apnea-AHI 35.1, optimal Pap pressure was 11, no significant central apnea 1.7 an hour  FENO:  No results found for: NITRICOXIDE  PFT: PFT Results Latest Ref Rng & Units 04/16/2018  FVC-Pre L 2.95  FVC-Predicted Pre % 88  FVC-Post L 2.95  FVC-Predicted Post % 88  Pre FEV1/FVC % % 81  Post FEV1/FCV % % 84  FEV1-Pre L 2.39  FEV1-Predicted  Pre % 90  FEV1-Post L 2.47  DLCO UNC% % 89  DLCO COR %Predicted % 111  TLC L 4.85  TLC % Predicted % 83  RV % Predicted % 81    WALK:  SIX MIN WALK 03/16/2018  Supplimental Oxygen during Test? (L/min) No  Tech Comments: slower than average pace with cane/leg pain/min SOB/lmr    Imaging: No results found.  Lab Results:  CBC    Component Value Date/Time   WBC 10.4 03/16/2018 1043   RBC 4.80 03/16/2018 1043   HGB 13.2 03/16/2018 1043   HCT 40.2 03/16/2018 1043   PLT 337.0 03/16/2018 1043   MCV 83.7 03/16/2018 1043   MCH 27.7 03/10/2018 1846   MCHC  32.8 03/16/2018 1043   RDW 15.4 03/16/2018 1043   LYMPHSABS 3.6 03/16/2018 1043   MONOABS 1.0 03/16/2018 1043   EOSABS 0.2 03/16/2018 1043   BASOSABS 0.1 03/16/2018 1043    BMET    Component Value Date/Time   NA 136 03/16/2018 1043   K 3.9 03/16/2018 1043   CL 102 03/16/2018 1043   CO2 24 03/16/2018 1043   GLUCOSE 307 (H) 03/16/2018 1043   BUN 21 03/16/2018 1043   CREATININE 0.99 03/16/2018 1043   CALCIUM 8.8 03/16/2018 1043   GFRNONAA 51 (L) 03/10/2018 1846   GFRAA 59 (L) 03/10/2018 1846    BNP No results found for: BNP  ProBNP    Component Value Date/Time   PROBNP 18.0 03/16/2018 1043    Specialty Problems      Pulmonary Problems   Bronchitis   Incidental lung nodule, > 69mm and < 65mm    5.3 mm RUL nodule on CT 06/23/10 in long time smoker.      Pneumonia, bacterial    CT 06/23/10- left lung infiltrate      DOE (dyspnea on exertion)    Onset July 2019 no better p stent Nadyne Coombes) though assoc cp resolved - Spirometry 03/16/2018  FEV1 2.4 (94%)  Ratio 0.86 nl curvature off all rx - 03/16/2018   Walked RA x one lap =  approx 250 ft - stopped due to leg/back pain, no sob/ sats still 98% @ slow pace   - PFT's   04/16/18  wnl        Severe obstructive sleep apnea    08/17/2018-split-night sleep study- severe obstructive sleep apnea-AHI 35.1, optimal Pap pressure was 11, no significant central apnea 1.7 an hour          Allergies  Allergen Reactions  . Contrast Media [Iodinated Diagnostic Agents] Swelling    Skin Peeling and break out  . Codeine Itching  . Other     NO PLASTIC TAPE    There is no immunization history on file for this patient.  Declined flu vaccine today, declined pneumonia vaccine today, she reports she will think about the pneumonia vaccine and discuss with primary care at next office visit.  Past Medical History:  Diagnosis Date  . ALLERGIC RHINITIS   . 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 Asthma    Questionable  . Diabetes mellitus, type 2 ([place])    . History of tobacco abuse    Quit 2011  . Hypertension   . Hypertriglyceridemia   . Hypothyroidism   . NSTEMI (non-ST elevated myocardial infarction) ([place]) 09/30/11   NO CAD by cath, ? coronary vasospasm  . Obesity     Tobacco History: Social History   Tobacco Use  Smoking Status Former Smoker  . Packs/day:  0.80  . Years: 36.00  . Pack years: 28.80  . Types: Cigarettes  . Quit date: 02/05/2011  . Years since quitting: 7.7  Smokeless Tobacco Never Used   Counseling given: Yes   Continue to not smoke  Outpatient Encounter Medications as of 11/09/2018  Medication Sig  . aspirin EC 81 MG tablet Take 81 mg by mouth daily.  . clopidogrel (PLAVIX) 75 MG tablet Take 1 tablet by mouth daily.  Marland Kitchen glipiZIDE (GLUCOTROL) 5 MG tablet Take 5 mg by mouth 2 (two) times daily before a meal.  . isosorbide mononitrate (IMDUR) 30 MG 24 hr tablet Take 1 tablet (30 mg total) by mouth daily.  Marland Kitchen levothyroxine (SYNTHROID) 200 MCG tablet TK 1 T PO QD  . methocarbamol (ROBAXIN) 500 MG tablet Take 1 tablet (500 mg total) by mouth 2 (two) times daily.  . metoprolol succinate (TOPROL-XL) 50 MG 24 hr tablet TK 1 T PO QD  . MITIGARE 0.6 MG CAPS Take 1 capsule by mouth daily.  . nitroGLYCERIN (NITROSTAT) 0.4 MG SL tablet Place 1 tablet (0.4 mg total) under the tongue every 5 (five) minutes as needed for chest pain (up to 3 doses).  . NOVOLOG MIX 70/30 FLEXPEN (70-30) 100 UNIT/ML FlexPen Inject 12-16 Units into the skin 3 (three) times daily with meals. Sliding scale  . potassium chloride SA (K-DUR,KLOR-CON) 20 MEQ tablet Take 20 mEq by mouth as needed (muscle aches/cramps).   . rosuvastatin (CRESTOR) 5 MG tablet Take 5 mg by mouth daily.  Marland Kitchen triamterene-hydrochlorothiazide (MAXZIDE) 75-50 MG per tablet Take 0.5 tablets by mouth daily.  . [DISCONTINUED] levothyroxine (SYNTHROID, LEVOTHROID) 300 MCG tablet Take 300 mcg by mouth daily.  . [DISCONTINUED] lidocaine (LIDODERM) 5 % Place 1 patch onto the skin daily.  Remove & Discard patch within 12 hours or as directed by MD  . [DISCONTINUED] metoprolol tartrate (LOPRESSOR) 25 MG tablet Take 0.5 tablets (12.5 mg total) by mouth 2 (two) times daily.   No facility-administered encounter medications on file as of 11/09/2018.      Review of Systems  Review of Systems  Constitutional: Negative for activity change, fatigue and fever.  HENT: Negative for sinus pressure, sinus pain and sore throat.   Respiratory: Negative for cough, shortness of breath and wheezing.   Cardiovascular: Negative for chest pain and palpitations.  Gastrointestinal: Negative for diarrhea, nausea and vomiting.  Musculoskeletal: Negative for arthralgias.  Neurological: Negative for dizziness.  Psychiatric/Behavioral: Negative for sleep disturbance. The patient is not nervous/anxious.      Physical Exam  BP 122/78   Pulse 85   Temp 97.6 F (36.4 C) (Temporal)   Ht 5\' 8"  (1.727 m)   Wt (!) 306 lb 3.2 oz (138.9 kg)   SpO2 98%   BMI 46.56 kg/m   Wt Readings from Last 5 Encounters:  11/09/18 (!) 306 lb 3.2 oz (138.9 kg)  08/17/18 (!) 311 lb (141.1 kg)  04/16/18 (!) 322 lb (146.1 kg)  03/16/18 (!) 323 lb (146.5 kg)  10/03/17 (!) 317 lb (143.8 kg)    BMI Readings from Last 5 Encounters:  11/09/18 46.56 kg/m  08/17/18 47.29 kg/m  04/16/18 47.55 kg/m  03/16/18 48.40 kg/m  10/03/17 46.81 kg/m     Physical Exam Vitals signs and nursing note reviewed.  Constitutional:      General: She is not in acute distress.    Appearance: Normal appearance.  HENT:     Head: Normocephalic and atraumatic.     Right Ear: Tympanic membrane,  ear canal and external ear normal. There is no impacted cerumen.     Left Ear: Tympanic membrane, ear canal and external ear normal. There is no impacted cerumen.     Nose: Nose normal. No congestion or rhinorrhea.     Mouth/Throat:     Mouth: Mucous membranes are moist.     Pharynx: Oropharynx is clear.     Comments: Mallampati 1 Eyes:      Pupils: Pupils are equal, round, and reactive to light.  Neck:     Musculoskeletal: Normal range of motion.  Cardiovascular:     Rate and Rhythm: Normal rate and regular rhythm.     Pulses: Normal pulses.     Heart sounds: Normal heart sounds. No murmur.  Pulmonary:     Effort: Pulmonary effort is normal. No respiratory distress.     Breath sounds: Normal breath sounds. No decreased air movement. No decreased breath sounds, wheezing or rales.  Skin:    General: Skin is warm and dry.     Capillary Refill: Capillary refill takes less than 2 seconds.  Neurological:     General: No focal deficit present.     Mental Status: She is alert and oriented to person, place, and time. Mental status is at baseline.     Gait: Gait normal.  Psychiatric:        Mood and Affect: Mood normal.        Behavior: Behavior normal.        Thought Content: Thought content normal.        Judgment: Judgment normal.       Assessment & Plan:   Severe obstructive sleep apnea Plan: Continue CPAP therapy Practice good sleep hygiene to improve length of sleep Can also consider over-the-counter melatonin 5 to 10 mg to help with maintaining sleep cycle Could potentially consider trazodone in the future if melatonin is unsuccessful We will do 42-month follow-up with Dr. Ander Slade to establish with  Morbid obesity ([place]) Plan: Continue to work on increasing daily physical activity  Healthcare maintenance Plan: We offered you the flu vaccine, you declined We offered you the Pneumovax 23 which would be recommended based off of your obesity as well as type 2 diabetes, you declined Please discussed these vaccines with primary care    Return in about 6 months (around 05/10/2019), or if symptoms worsen or fail to improve, for Follow up with Dr. Ander Slade.   Lauraine Rinne, NP 11/09/2018   This appointment was 26 minutes long with over 50% of the time in direct face-to-face patient care, assessment, plan of  care, and follow-up.

## 2018-11-09 NOTE — Assessment & Plan Note (Signed)
Plan: Continue to work on increasing daily physical activity

## 2018-11-09 NOTE — Patient Instructions (Addendum)
You were seen today by Lauraine Rinne, NP  for:   1. Severe obstructive sleep apnea  We recommend that you continue using your CPAP daily >>>Keep up the hard work using your device >>> Goal should be wearing this for the entire night that you are sleeping, at least 4 to 6 hours  Remember:  . Do not drive or operate heavy machinery if tired or drowsy.  . Please notify the supply company and office if you are unable to use your device regularly due to missing supplies or machine being broken.  . Work on maintaining a healthy weight and following your recommended nutrition plan  . Maintain proper daily exercise and movement  . Maintaining proper use of your device can also help improve management of other chronic illnesses such as: Blood pressure, blood sugars, and weight management.   BiPAP/ CPAP Cleaning:  >>>Clean weekly, with Dawn soap, and bottle brush.  Set up to air dry.   Work on improving sleep hygiene:   Sleep habits   Keep a sleep diary to help you and your health care provider figure out what could be causing your insomnia. Write down: ? When you sleep. ? When you wake up during the night. ? How well you sleep. ? How rested you feel the next day. ? Any side effects of medicines you are taking. ? What you eat and drink.  Make your bedroom a dark, comfortable place where it is easy to fall asleep. ? Put up shades or blackout curtains to block light from outside. ? Use a white noise machine to block noise. ? Keep the temperature cool.  Limit screen use before bedtime. This includes: ? Watching TV. ? Using your smartphone, tablet, or computer.  Stick to a routine that includes going to bed and waking up at the same times every day and night. This can help you fall asleep faster. Consider making a quiet activity, such as reading, part of your nighttime routine.  Try to avoid taking naps during the day so that you sleep better at night.  Get out of bed if you are still  awake after 15 minutes of trying to sleep. Keep the lights down, but try reading or doing a quiet activity. When you feel sleepy, go back to bed.   2. Morbid obesity ([place])  Increase daily physical activity to work to reduce weight as well as your BMI  Discussed dietary weight loss strategies with primary care when you see them at your next appointment  3. Healthcare maintenance  We offered you the flu vaccine today, you declined  I would recommend that you continue to discuss these vaccines with primary care.  You are also due for pneumonia vaccine specifically the Pneumovax 23.  Discussed receiving this with Dr. Orma Render   We recommend today:  No orders of the defined types were placed in this encounter.  No orders of the defined types were placed in this encounter.  No orders of the defined types were placed in this encounter.   Follow Up:    Return in about 6 months (around 05/10/2019), or if symptoms worsen or fail to improve, for Follow up with Dr. Ander Slade.   Please do your part to reduce the spread of COVID-19:      Reduce your risk of any infection  and COVID19 by using the similar precautions used for avoiding the common cold or flu:  Marland Kitchen Wash your hands often with soap and warm water for at  least 20 seconds.  If soap and water are not readily available, use an alcohol-based hand sanitizer with at least 60% alcohol.  . If coughing or sneezing, cover your mouth and nose by coughing or sneezing into the elbow areas of your shirt or coat, into a tissue or into your sleeve (not your hands). Langley Gauss A MASK when in public  . Avoid shaking hands with others and consider head nods or verbal greetings only. . Avoid touching your eyes, nose, or mouth with unwashed hands.  . Avoid close contact with people who are sick. . Avoid places or events with large numbers of people in one location, like concerts or sporting events. . If you have some symptoms but not all symptoms, continue to  monitor at home and seek medical attention if your symptoms worsen. . If you are having a medical emergency, call 911.   [facility] / e-Visit: [email or web address]         [facility]: [facility]: [id number]                   [facility]: [id number]     It is flu season:   >>> Best ways to protect herself from the flu: Receive the yearly flu vaccine, practice good hand hygiene washing with soap and also using hand sanitizer when available, eat a nutritious meals, get adequate rest, hydrate appropriately   Please contact the office if your symptoms worsen or you have concerns that you are not improving.   Thank you for choosing [facility] for your healthcare, and for allowing [place] to partner with you on your healthcare journey. I am thankful to be able to provide care to you today.   Wyn Quaker FNP-C

## 2018-11-09 NOTE — Assessment & Plan Note (Signed)
Plan: We offered you the flu vaccine, you declined We offered you the Pneumovax 23 which would be recommended based off of your obesity as well as type 2 diabetes, you declined Please discussed these vaccines with primary care

## 2018-11-09 NOTE — Assessment & Plan Note (Signed)
Plan: Continue CPAP therapy Practice good sleep hygiene to improve length of sleep Can also consider over-the-counter melatonin 5 to 10 mg to help with maintaining sleep cycle Could potentially consider trazodone in the future if melatonin is unsuccessful We will do 25-month follow-up with Dr. Ander Slade to establish with

## 2019-01-13 ENCOUNTER — Other Ambulatory Visit: Payer: Self-pay | Admitting: Neurosurgery

## 2019-01-13 DIAGNOSIS — M4316 Spondylolisthesis, lumbar region: Secondary | ICD-10-CM

## 2019-01-27 ENCOUNTER — Other Ambulatory Visit: Payer: Self-pay

## 2019-01-27 ENCOUNTER — Ambulatory Visit
Admission: RE | Admit: 2019-01-27 | Discharge: 2019-01-27 | Disposition: A | Discharge status: Home / Self Care | Payer: Medicaid Other | Source: Ambulatory Visit | Attending: Neurosurgery | Admitting: Neurosurgery

## 2019-01-27 DIAGNOSIS — M4316 Spondylolisthesis, lumbar region: Secondary | ICD-10-CM

## 2019-08-25 IMAGING — DX DG CHEST 2V
2 series · 2 of 2 positions shown · non-contrast
Comparison: None.

CLINICAL DATA: Chest pain for 3 days

EXAM:
CHEST - 2 VIEW

[w chest pa]
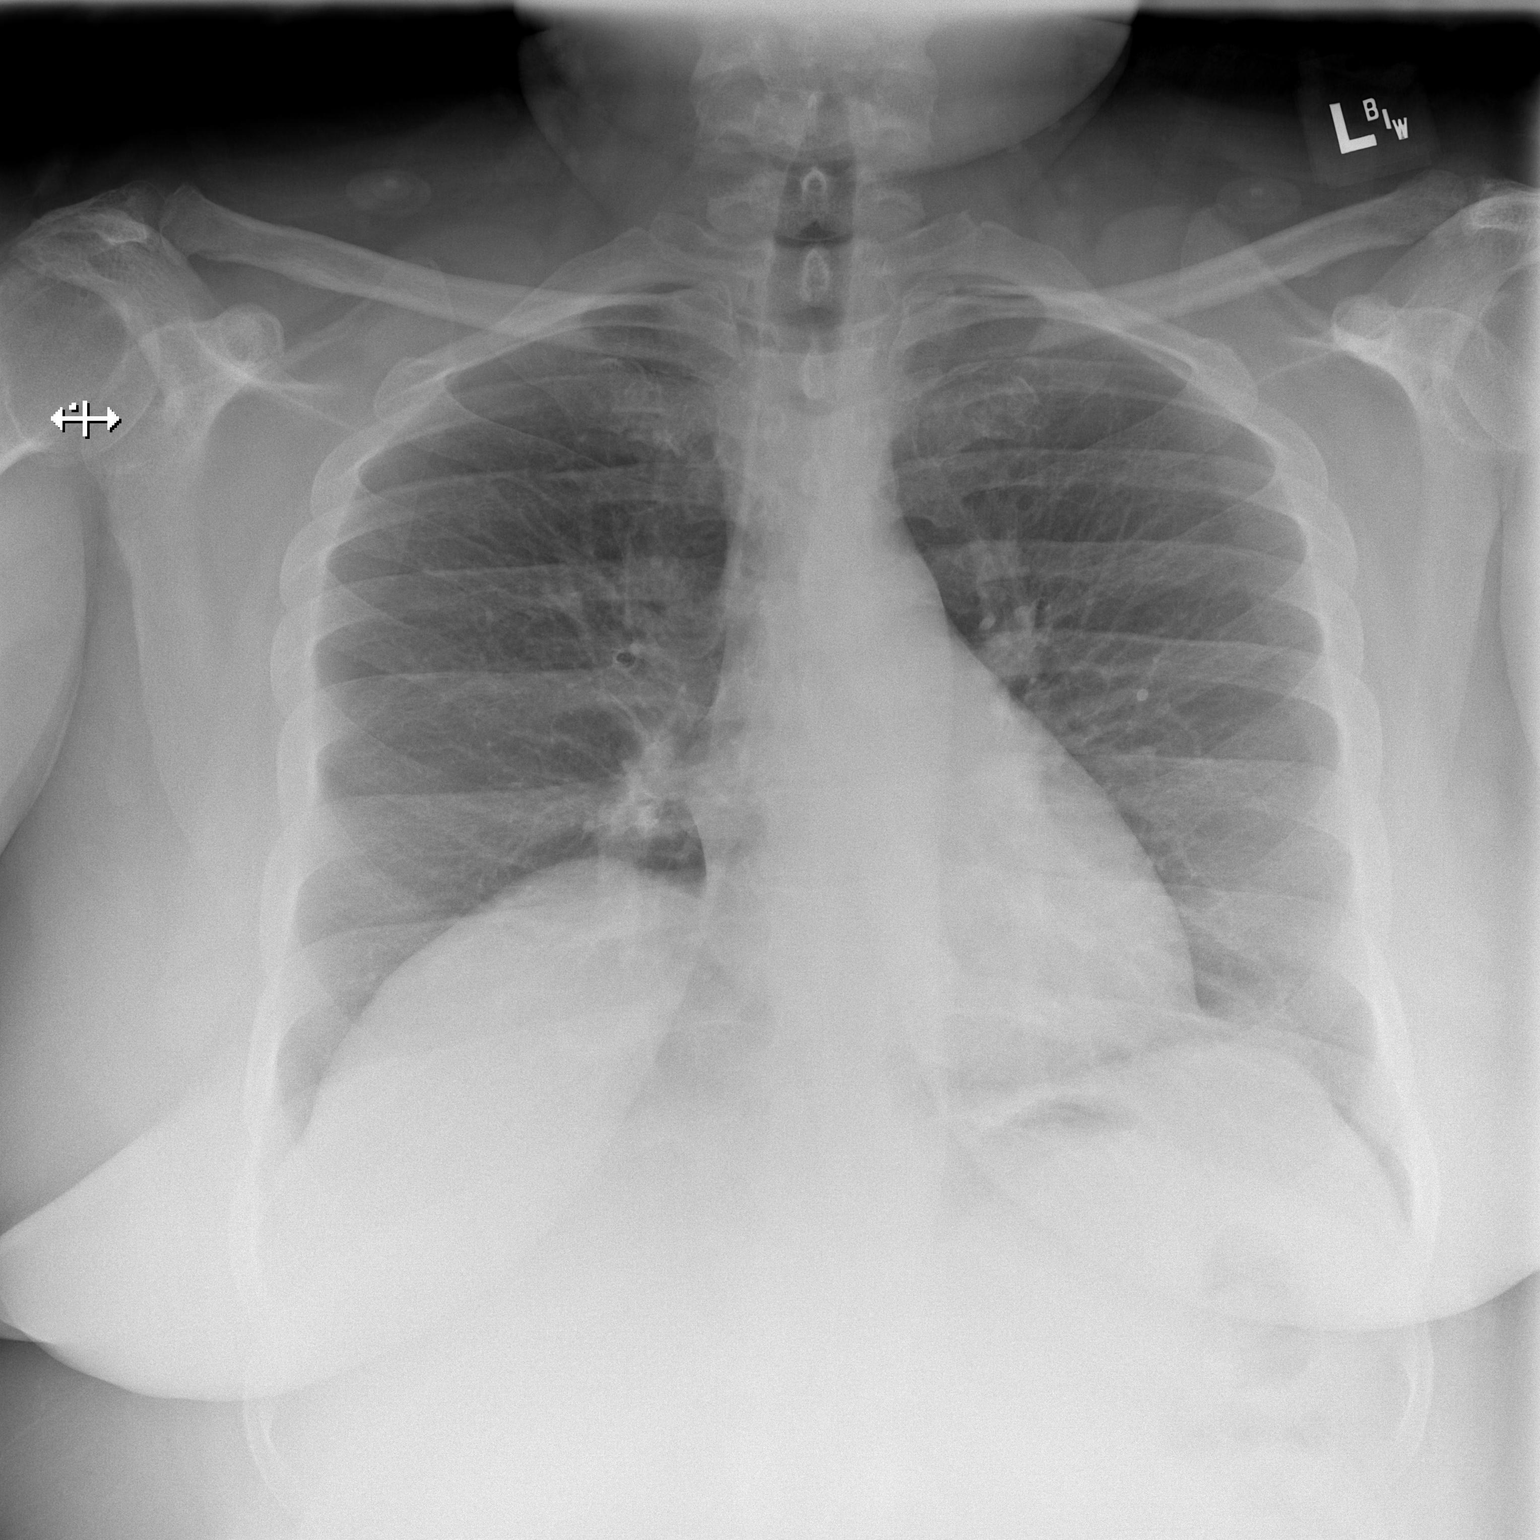

[w chest lat]
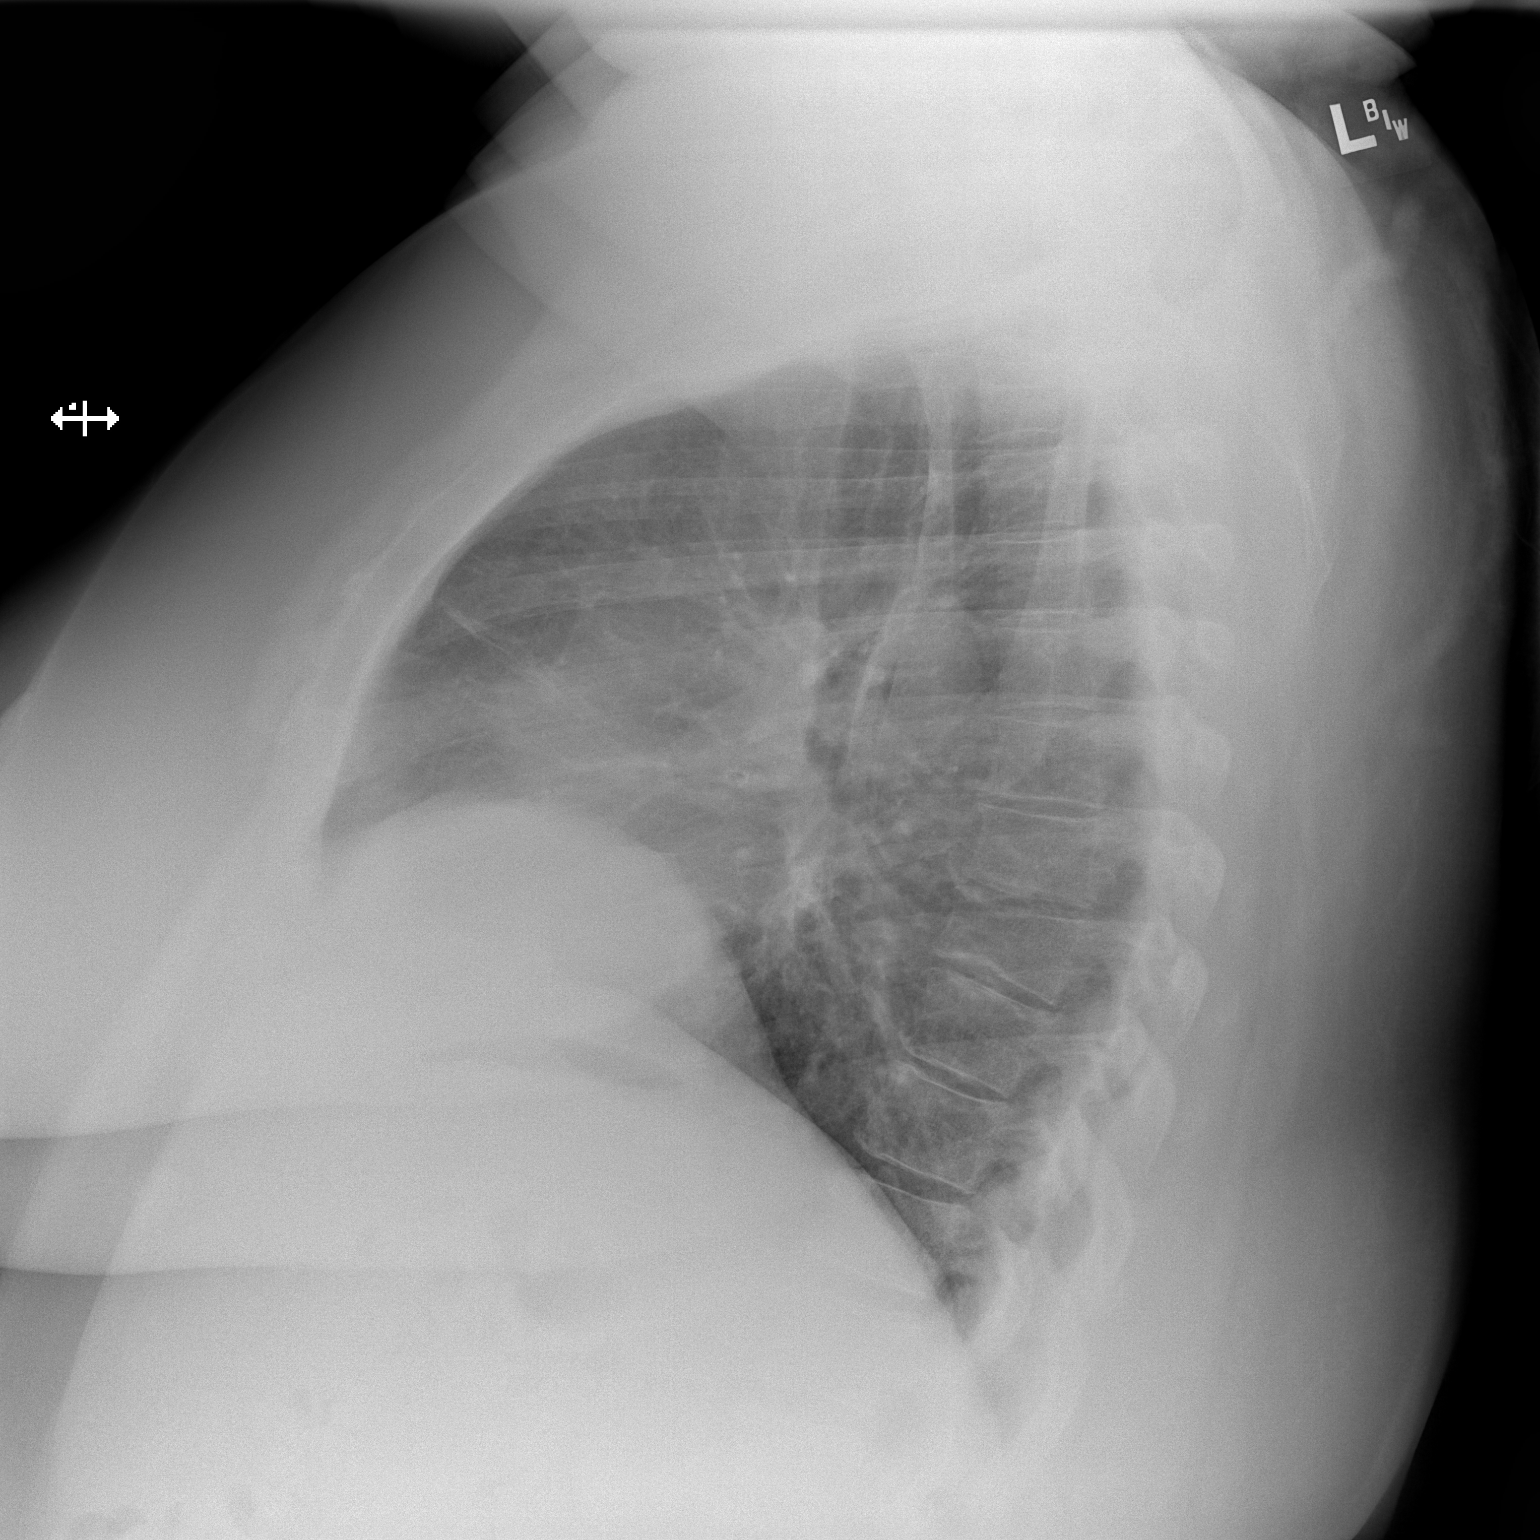

[2 of 2 positions shown; findings below may reference images not displayed]

FINDINGS: The heart size and mediastinal contours are within normal limits.
Both lungs are clear. The visualized skeletal structures are
unremarkable.
IMPRESSION: No active cardiopulmonary disease.

## 2019-08-27 IMAGING — DX DG CHEST 2V
2 series · 2 of 2 positions shown · non-contrast
Comparison: 08/15/2017

CLINICAL DATA: Chest pain and dyspnea x3 days.

EXAM:
CHEST - 2 VIEW

[chest pa]
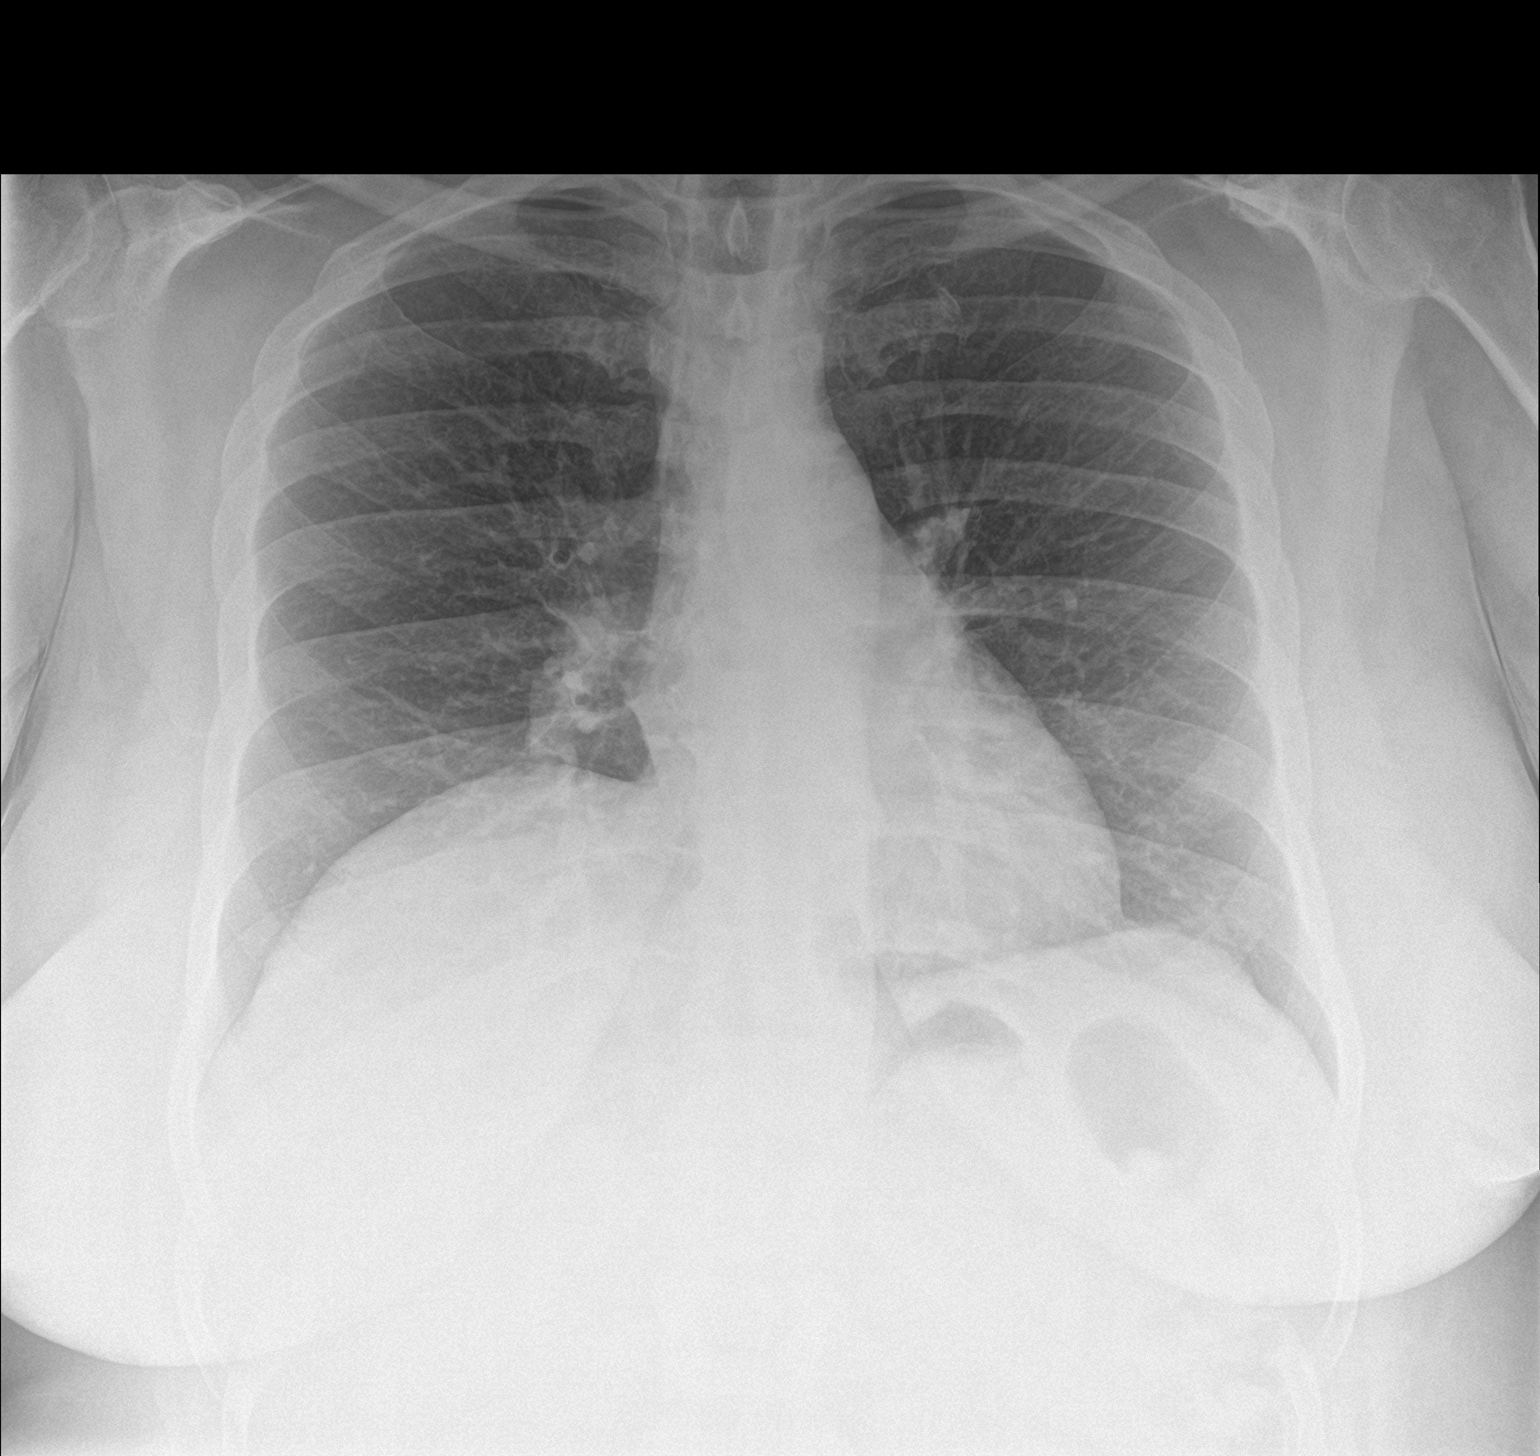

[chest lat]
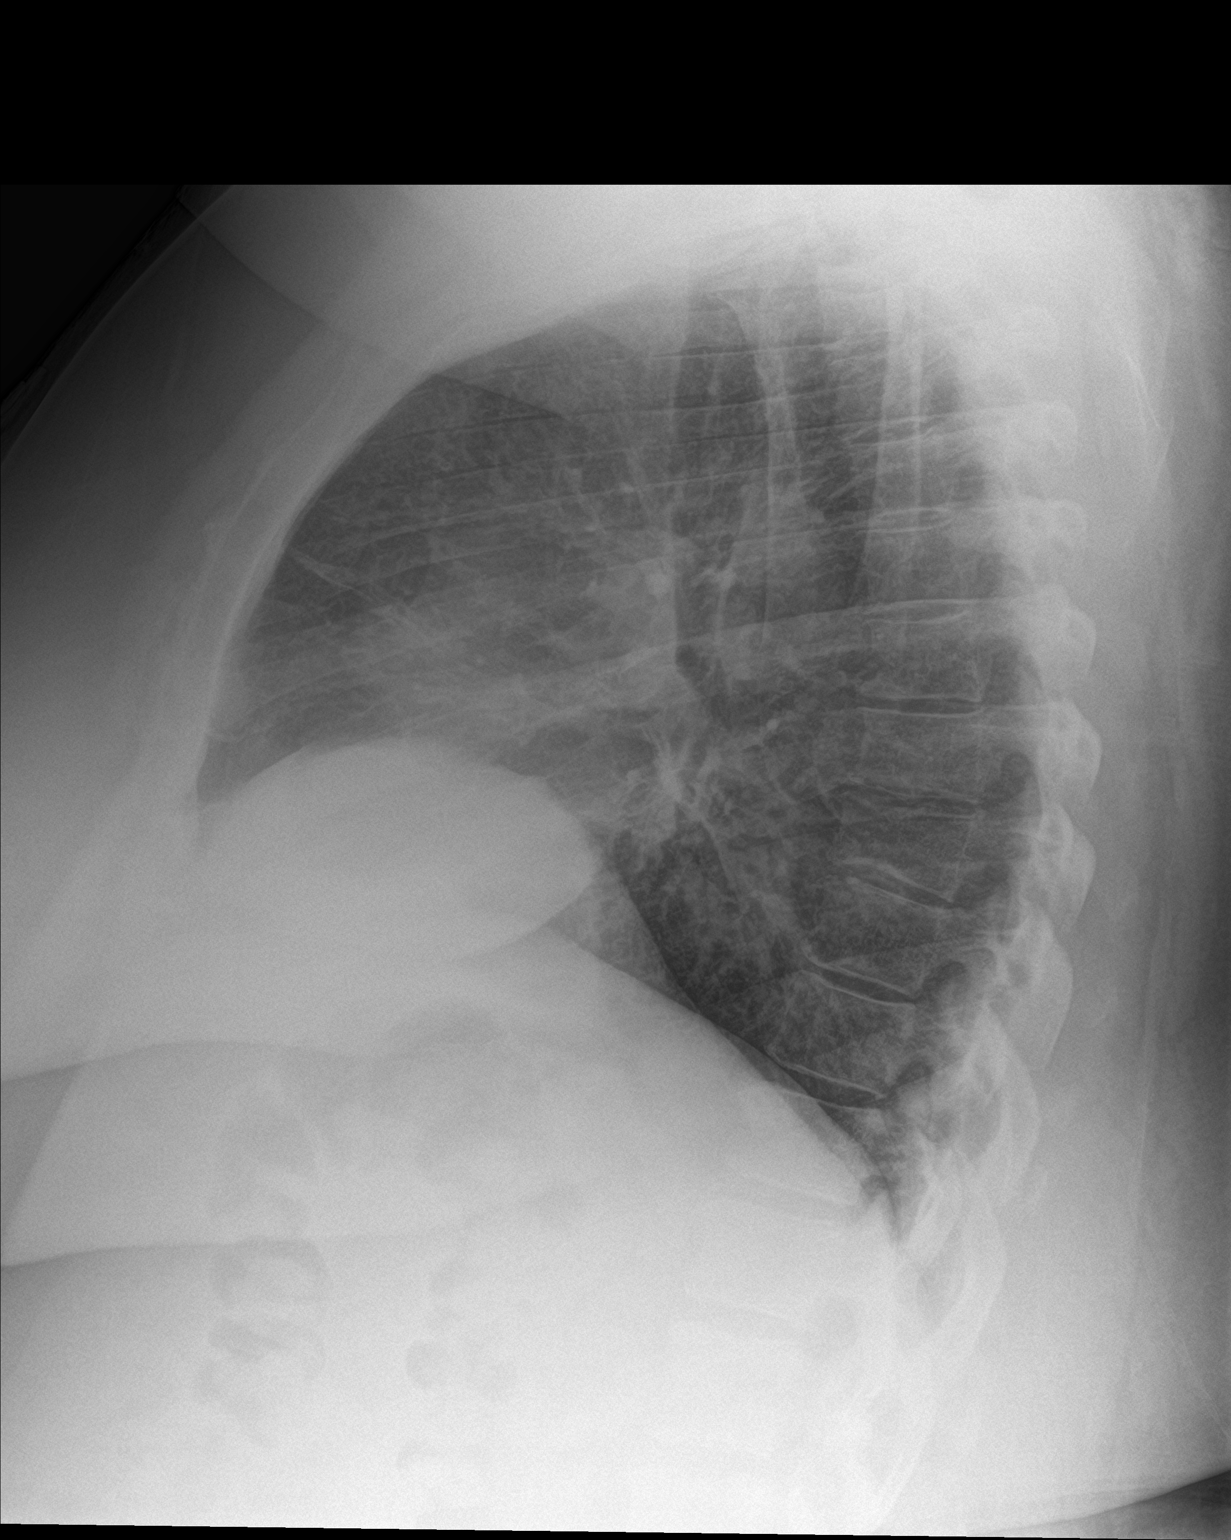

[2 of 2 positions shown; findings below may reference images not displayed]

FINDINGS: The heart size and mediastinal contours are within normal limits.
Both lungs are clear. The visualized skeletal structures are
unremarkable.
IMPRESSION: No active cardiopulmonary disease.

## 2019-12-28 ENCOUNTER — Encounter: Payer: Self-pay | Admitting: Family

## 2019-12-28 ENCOUNTER — Ambulatory Visit (INDEPENDENT_AMBULATORY_CARE_PROVIDER_SITE_OTHER): Payer: Medicaid Other | Admitting: Family

## 2019-12-28 ENCOUNTER — Ambulatory Visit: Payer: Self-pay

## 2019-12-28 DIAGNOSIS — M545 Low back pain, unspecified: Secondary | ICD-10-CM

## 2019-12-29 NOTE — Progress Notes (Signed)
Office Visit Note   Patient: [name]           Date of Birth: 10-15-1960           MRN: [id number] Visit Date: 12/28/2019              Requested by: Gevena Mart, DO [address] [place] [address] PCP: Gevena Mart, DO  Chief Complaint  Patient presents with  . Lower Back - Pain  . Right Leg - Pain      HPI: The patient is a 59 year old woman who comes in today complaining of chronic right-sided back pain as well as radiculopathy down the right lower extremity.  Pain starts in her low back radiates into her right hip as well as her groin it does extend to her posterior right calf.  This is been ongoing since 2018 she has had significant work-ups in the past with neurosurgery for the same  She is unable to sit stand or lay for prolonged period of time feels that her right lower extremity locks up with prolonged standing occasionally does have anterior radiculopathy as well primarily in her posterior right lower extremity was told by Dr. Cyndy Freeze she would benefit from a spinal fusion, from outside records it appears he felt that operative decompression and fixation at the level of L4-L5 would be her best option  She has been having burning and numbness in her right lower extremity sensation of pulling in her posterior thigh has had ESI's in the past without much relief  She also endorses worsening of her pain and symptoms since her last MRI which was in December 2020  Assessment & Plan: Visit Diagnoses:  1. Low back pain, unspecified back pain laterality, unspecified chronicity, unspecified whether sciatica present     Plan: We will update her MRI as she has had worsening of her pain and symptoms.  Have her follow-up with Dr. Lorin Mercy to get his opinion and surgical options  Follow-Up Instructions: Return follow up with yates for mri review.   Back Exam   Tenderness  The patient is experiencing tenderness in the lumbar.  Muscle Strength  The patient  has normal back strength.  Tests  Straight leg raise right: positive      Patient is alert, oriented, no adenopathy, well-dressed, normal affect, normal respiratory effort.   Imaging: No results found. No images are attached to the encounter.  Labs: Lab Results  Component Value Date   HGBA1C 9.4 (H) 08/18/2017   HGBA1C 11.5 (H) 09/29/2011   REPTSTATUS 11/03/2015 FINAL 10/31/2015   CULT NO GROUP A STREP (S.PYOGENES) ISOLATED 10/31/2015     Lab Results  Component Value Date   ALBUMIN 4.0 10/03/2011   ALBUMIN 3.5 10/01/2011    No results found for: MG No results found for: VD25OH  No results found for: PREALBUMIN CBC EXTENDED Latest Ref Rng & Units 03/16/2018 03/10/2018 08/20/2017  WBC 4.0 - 10.5 K/uL 10.4 12.4(H) 15.5(H)  RBC 3.87 - 5.11 Mil/uL 4.80 4.83 4.17  HGB 12.0 - 15.0 g/dL 13.2 13.4 11.9(L)  HCT 36 - 46 % 40.2 41.4 37.1  PLT 150 - 400 K/uL 337.0 334 323  NEUTROABS 1.4 - 7.7 K/uL 5.6 5.9 -  LYMPHSABS 0.7 - 4.0 K/uL 3.6 4.8(H) -     There is no height or weight on file to calculate BMI.  Orders:  Orders Placed This Encounter  Procedures  . XR Lumbar Spine 2-3 Views  .  MR LUMBAR SPINE WO CONTRAST   No orders of the defined types were placed in this encounter.    Procedures: No procedures performed  Clinical Data: No additional findings.  ROS:  All other systems negative, except as noted in the HPI. Review of Systems  Constitutional: Negative for chills and fever.  Musculoskeletal: Positive for arthralgias and back pain.  Neurological: Positive for numbness. Negative for weakness.    Objective: Vital Signs: There were no vitals taken for this visit.  Specialty Comments:  No specialty comments available.  PMFS History: Patient Active Problem List   Diagnosis Date Noted  . Severe obstructive sleep apnea 11/09/2018  . Healthcare maintenance 11/09/2018  . At risk for obstructive sleep apnea 04/16/2018  . Acquired hypothyroidism 03/17/2018   . DOE (dyspnea on exertion) 03/16/2018  . NSTEMI (non-ST elevated myocardial infarction) ([place]) 08/18/2017  . Right sided sciatica 07/10/2017  . Right low back pain 07/10/2017  . Chest pain 09/29/2011  . Morbid obesity ([place]) 09/29/2011  . DM type 2 (diabetes mellitus, type 2) ([place]) 09/29/2011  . HTN (hypertension) 09/29/2011  . Pneumonia, bacterial 08/07/2010  . Incidental lung nodule, > 71mm and < 73mm 08/07/2010  . Bronchitis 08/07/2010   Past Medical History:  Diagnosis Date  . ALLERGIC RHINITIS   . 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 Asthma    Questionable  . Diabetes mellitus, type 2 ([place])   . History of tobacco abuse    Quit 2011  . Hypertension   . Hypertriglyceridemia   . Hypothyroidism   . NSTEMI (non-ST elevated myocardial infarction) ([place]) 09/30/11   NO CAD by cath, ? coronary vasospasm  . Obesity     Family History  Problem Relation Age of Onset  . Heart disease Mother        has pacemaker  . Diabetes Mother   . Diabetes Other        strong family history  . Cancer Maternal Uncle     Past Surgical History:  Procedure Laterality Date  . CORONARY STENT INTERVENTION N/A 08/19/2017   Procedure: CORONARY STENT INTERVENTION;  Surgeon: [name], MD;  Location: [facility];  Service: Cardiovascular;  Laterality: N/A;  . LEFT HEART CATH AND CORONARY ANGIOGRAPHY N/A 08/19/2017   Procedure: LEFT HEART CATH AND CORONARY ANGIOGRAPHY;  Surgeon: [name], MD;  Location: [facility];  Service: Cardiovascular;  Laterality: N/A;  . LEFT HEART CATHETERIZATION WITH CORONARY ANGIOGRAM N/A 09/30/2011   Procedure: LEFT HEART CATHETERIZATION WITH CORONARY ANGIOGRAM;  Surgeon: Burnell Blanks, MD;  Location: [facility];  Service: Cardiovascular;  Laterality: N/A;  . TUBAL LIGATION    . VESICOVAGINAL FISTULA CLOSURE W/ TAH     Social History   Occupational History  . Occupation: Freight forwarder at [employer].  Tobacco Use  . Smoking status: Former Smoker    Packs/day: 0.80     Years: 36.00    Pack years: 28.80    Types: Cigarettes    Quit date: 02/05/2011    Years since quitting: 8.9  . Smokeless tobacco: Never Used  Vaping Use  . Vaping Use: Never used  Substance and Sexual Activity  . Alcohol use: Yes    Comment: VERY RARE  . Drug use: No  . Sexual activity: Not on file

## 2020-01-10 DIAGNOSIS — E785 Hyperlipidemia, unspecified: Secondary | ICD-10-CM | POA: Diagnosis not present

## 2020-01-10 DIAGNOSIS — Z720 Tobacco use: Secondary | ICD-10-CM | POA: Diagnosis not present

## 2020-01-10 DIAGNOSIS — I10 Essential (primary) hypertension: Secondary | ICD-10-CM | POA: Diagnosis not present

## 2020-01-10 DIAGNOSIS — E1165 Type 2 diabetes mellitus with hyperglycemia: Secondary | ICD-10-CM | POA: Diagnosis not present

## 2020-01-10 DIAGNOSIS — I2510 Atherosclerotic heart disease of native coronary artery without angina pectoris: Secondary | ICD-10-CM | POA: Diagnosis not present

## 2020-01-10 DIAGNOSIS — E039 Hypothyroidism, unspecified: Secondary | ICD-10-CM | POA: Diagnosis not present

## 2020-01-10 DIAGNOSIS — Z794 Long term (current) use of insulin: Secondary | ICD-10-CM | POA: Diagnosis not present

## 2020-01-10 DIAGNOSIS — E119 Type 2 diabetes mellitus without complications: Secondary | ICD-10-CM | POA: Diagnosis not present

## 2020-01-11 ENCOUNTER — Telehealth: Payer: Self-pay | Admitting: Orthopaedic Surgery

## 2020-01-11 NOTE — Telephone Encounter (Signed)
Called patient left message on voicemail to schedule an appointment for MRI review with Dr. Lorin Mercy

## 2020-01-18 ENCOUNTER — Other Ambulatory Visit: Payer: Self-pay

## 2020-01-18 ENCOUNTER — Ambulatory Visit
Admission: RE | Admit: 2020-01-18 | Discharge: 2020-01-18 | Disposition: A | Discharge status: Home / Self Care | Payer: Medicare HMO | Source: Ambulatory Visit | Attending: Family | Admitting: Family

## 2020-01-18 DIAGNOSIS — M545 Low back pain, unspecified: Secondary | ICD-10-CM

## 2020-01-25 ENCOUNTER — Encounter: Payer: Self-pay | Admitting: Orthopaedic Surgery

## 2020-01-25 ENCOUNTER — Ambulatory Visit (INDEPENDENT_AMBULATORY_CARE_PROVIDER_SITE_OTHER): Payer: Medicare HMO

## 2020-01-25 ENCOUNTER — Other Ambulatory Visit: Payer: Self-pay

## 2020-01-25 ENCOUNTER — Ambulatory Visit (INDEPENDENT_AMBULATORY_CARE_PROVIDER_SITE_OTHER): Payer: Medicare HMO | Admitting: Orthopaedic Surgery

## 2020-01-25 VITALS — BP 136/80 | HR 75 | Ht 68.5 in | Wt 299.0 lb

## 2020-01-25 DIAGNOSIS — M545 Low back pain, unspecified: Secondary | ICD-10-CM

## 2020-01-25 NOTE — Progress Notes (Signed)
Office Visit Note   Patient: [name]           Date of Birth: December 09, 1960           MRN: [id number] Visit Date: 01/25/2020              Requested by: Gevena Mart, DO [address] [place] [address] PCP: Gevena Mart, DO   Assessment & Plan: Visit Diagnoses:  1. Low back pain, unspecified back pain laterality, unspecified chronicity, unspecified whether sciatica present     Plan: Reviewed x-rays with her and MRI report.  Previous MRI reviewed as well I gave her a copy of the MRI report.  We will have her talk with Dr. Elsworth Soho as she might be a candidate for single level fusion but with her body habitus cortical trajectory screws would be preferable since dissection would not need to be as wide.  Will have appointment set up so she can discuss this with him.  Follow-Up Instructions: No follow-ups on file.   Orders:  Orders Placed This Encounter  Procedures  . XR Lumb Spine Flex&Ext Only   No orders of the defined types were placed in this encounter.     Procedures: No procedures performed   Clinical Data: No additional findings.   Subjective: Chief Complaint  Patient presents with  . Lower Back - Pain    HPI 59 year old female with chronic back pain here with her daughter.  Patient has had recent MRI ordered by [name] which shows grade 1 anterolisthesis at L4-5 of  7 mm and possibly tiny fragment left facet hypertrophy grazing the left L5 nerve root.  Patient is having more right than left leg symptoms.  She not able to walk far has trouble standing pain also sometimes runs into her groin.  She previously had seen Dr. Christella Noa and was told she needed a single level fusion.  She also had seen Dr. Gladstone Lighter in October.  She has had previous epidural injections which may have helped temporarily.  Other problems include morbid obesity with BMI of 44, non-STEMI in the past, hypertension, type 2 diabetes on insulin.  Patient states she does not want  to go back to the neurosurgery office and wants to talk to our office about possible surgery.  Review of Systems negative for chills or fever.  All other systems are noncontributory to HPI.   Objective: Vital Signs: BP 136/80   Pulse 75   Ht 5' 8.5" (1.74 m)   Wt 299 lb (135.6 kg)   BMI 44.80 kg/m   Physical Exam Constitutional:      Appearance: She is well-developed.  HENT:     Head: Normocephalic.     Right Ear: External ear normal.     Left Ear: External ear normal.  Eyes:     Pupils: Pupils are equal, round, and reactive to light.  Neck:     Thyroid: No thyromegaly.     Trachea: No tracheal deviation.  Cardiovascular:     Rate and Rhythm: Normal rate.  Pulmonary:     Effort: Pulmonary effort is normal.  Abdominal:     Palpations: Abdomen is soft.  Skin:    General: Skin is warm and dry.  Neurological:     Mental Status: She is alert and oriented to person, place, and time.  Psychiatric:        Mood and Affect: Mood and affect normal.  Behavior: Behavior normal.     Ortho Exam patient can ambulate anterior tib gastrocsoleus is strong.  Negative logroll to the hips.  Skin over lumbar spine is normal.  Specialty Comments:  No specialty comments available.  Imaging:  Study Result  Narrative & Impression  CLINICAL DATA:  Lumbar radiculopathy, > 6 wks. Right leg pain and numbness.  EXAM: MRI LUMBAR SPINE WITHOUT CONTRAST  TECHNIQUE: Multiplanar, multisequence MR imaging of the lumbar spine was performed. No intravenous contrast was administered.  COMPARISON:  12/28/2019.  01/27/2019 and prior.  FINDINGS: Segmentation:  Standard.  Alignment:  Minimal grade 1 L4-5 anterolisthesis, unchanged.  Vertebrae: Multilevel Modic type 2 endplate degenerative changes. Vertebral body heights are preserved. No aggressive osseous lesion.  Conus medullaris and cauda equina: Conus extends to the L1 level. Conus and cauda equina appear normal.  Disc  levels: Multilevel desiccation.  L1-2: No significant disc bulge. Bilateral facet hypertrophy. Patent spinal canal and neural foramen.  L2-3: Mild disc bulge with superimposed small right greater than left foraminal protrusions. Bilateral facet hypertrophy. Patent spinal canal and left neural foramen. Mild right neural foraminal narrowing.  L3-4: Mild disc bulge with shallow foraminal protrusions. Left foraminal annular fissuring. Bilateral facet hypertrophy. Patent spinal canal. Mild bilateral neural foraminal narrowing.  L4-5: Grade 1 anterolisthesis with uncovered bulge. Bilateral facet hypertrophy. 0.7 cm left subarticular T2 hypointense focus (6:26) grazing the descending left L5 nerve root. Mild spinal canal, mild right and mild-to-moderate left neural foraminal narrowing.  L5-S1: Disc bulge with shallow central protrusion/annular fissuring. Bilateral facet hypertrophy. Patent spinal canal and neural foramen.  Paraspinal and other soft tissues: Negative.  IMPRESSION: Multilevel spondylosis. 7 mm left L4-5 subarticular lesion may reflect a free disc fragment versus focal left facet hypertrophy grazing the descending left L5 nerve root. Mild spinal canal and mild-to-moderate neural foraminal narrowing at this level.  Mild right L2-3 and bilateral L3-4 neural foraminal narrowing.   Electronically Signed   By: Primitivo Gauze M.D.      PMFS History: Patient Active Problem List   Diagnosis Date Noted  . Severe obstructive sleep apnea 11/09/2018  . Healthcare maintenance 11/09/2018  . At risk for obstructive sleep apnea 04/16/2018  . Acquired hypothyroidism 03/17/2018  . DOE (dyspnea on exertion) 03/16/2018  . NSTEMI (non-ST elevated myocardial infarction) ([place]) 08/18/2017  . Right sided sciatica 07/10/2017  . Right low back pain 07/10/2017  . Chest pain 09/29/2011  . Morbid obesity ([place]) 09/29/2011  . DM type 2 (diabetes mellitus, type 2) ([place])  09/29/2011  . HTN (hypertension) 09/29/2011  . Pneumonia, bacterial 08/07/2010  . Incidental lung nodule, > 36mm and < 51mm 08/07/2010  . Bronchitis 08/07/2010   Past Medical History:  Diagnosis Date  . ALLERGIC RHINITIS   . 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 Asthma    Questionable  . Diabetes mellitus, type 2 ([place])   . History of tobacco abuse    Quit 2011  . Hypertension   . Hypertriglyceridemia   . Hypothyroidism   . NSTEMI (non-ST elevated myocardial infarction) ([place]) 09/30/11   NO CAD by cath, ? coronary vasospasm  . Obesity     Family History  Problem Relation Age of Onset  . Heart disease Mother        has pacemaker  . Diabetes Mother   . Diabetes Other        strong family history  . Cancer Maternal Uncle     Past Surgical History:  Procedure Laterality Date  .  CORONARY STENT INTERVENTION N/A 08/19/2017   Procedure: CORONARY STENT INTERVENTION;  Surgeon: [name], MD;  Location: [facility];  Service: Cardiovascular;  Laterality: N/A;  . LEFT HEART CATH AND CORONARY ANGIOGRAPHY N/A 08/19/2017   Procedure: LEFT HEART CATH AND CORONARY ANGIOGRAPHY;  Surgeon: [name], MD;  Location: [facility];  Service: Cardiovascular;  Laterality: N/A;  . LEFT HEART CATHETERIZATION WITH CORONARY ANGIOGRAM N/A 09/30/2011   Procedure: LEFT HEART CATHETERIZATION WITH CORONARY ANGIOGRAM;  Surgeon: Burnell Blanks, MD;  Location: [facility];  Service: Cardiovascular;  Laterality: N/A;  . TUBAL LIGATION    . VESICOVAGINAL FISTULA CLOSURE W/ TAH     Social History   Occupational History  . Occupation: Freight forwarder at [employer].  Tobacco Use  . Smoking status: Former Smoker    Packs/day: 0.80    Years: 36.00    Pack years: 28.80    Types: Cigarettes    Quit date: 02/05/2011    Years since quitting: 8.9  . Smokeless tobacco: Never Used  Vaping Use  . Vaping Use: Never used  Substance and Sexual Activity  . Alcohol use: Yes    Comment: VERY RARE  . Drug use: No  . Sexual  activity: Not on file

## 2020-01-25 NOTE — Addendum Note (Signed)
Addended by: Meyer Cory on: 01/25/2020 03:13 PM   Modules accepted: Orders

## 2020-02-17 ENCOUNTER — Encounter: Payer: Self-pay | Admitting: Specialist

## 2020-02-17 ENCOUNTER — Ambulatory Visit (INDEPENDENT_AMBULATORY_CARE_PROVIDER_SITE_OTHER): Payer: Medicare HMO | Admitting: Specialist

## 2020-02-17 ENCOUNTER — Other Ambulatory Visit: Payer: Self-pay

## 2020-02-17 VITALS — BP 131/81 | HR 67 | Ht 68.5 in | Wt 299.0 lb

## 2020-02-17 DIAGNOSIS — M4316 Spondylolisthesis, lumbar region: Secondary | ICD-10-CM

## 2020-02-17 DIAGNOSIS — M5136 Other intervertebral disc degeneration, lumbar region: Secondary | ICD-10-CM

## 2020-02-17 DIAGNOSIS — M48062 Spinal stenosis, lumbar region with neurogenic claudication: Secondary | ICD-10-CM | POA: Diagnosis not present

## 2020-02-17 MED ORDER — HYDROCODONE-ACETAMINOPHEN 5-325 MG PO TABS: 1.0000 | ORAL_TABLET | Freq: Four times a day (QID) | ORAL | 0 refills | Status: DC | PRN

## 2020-02-17 NOTE — Patient Instructions (Signed)
Plan: Avoid bending, stooping and avoid lifting weights greater than 10 lbs. Avoid prolong standing and walking. Order for a new walker with wheels. Surgery scheduling secretary Sherri Billings, will call you in the next week to schedule for surgery.  Surgery recommended is a one level lumbar fusion L4-5 this would be done with rods, screws and cages with local bone graft and allograft (donor bone graft). Take hydrocodone for for pain. Risk of surgery includes risk of infection 1 in 100 patients, bleeding 1/2% chance you would need a transfusion.   Risk to the nerves is one in 10,000. You will need to use a brace for 3 months and wean from the brace on the 4th month. Expect improved walking and standing tolerance. Expect relief of leg pain but numbness may persist depending on the length and degree of pressure that has been present. 

## 2020-02-17 NOTE — Progress Notes (Signed)
Office Visit Note   Patient: [name]           Date of Birth: [birth year]           MRN: [id number] Visit Date: 02/17/2020              Requested by: Marybelle Killings, MD [place],  [place] [address] PCP: Gevena Mart, DO   Assessment & Plan: Visit Diagnoses:  1. Spinal stenosis of lumbar region with neurogenic claudication   2. Spondylolisthesis, lumbar region   3. Degenerative disc disease, lumbar     Plan: Avoid bending, stooping and avoid lifting weights greater than 10 lbs. Avoid prolong standing and walking. Order for a new walker with wheels. Surgery scheduling secretary Kandice Hams, will call you in the next week to schedule for surgery.  Surgery recommended is a one level lumbar fusion L4-5 this would be done with rods, screws and cages with local bone graft and allograft (donor bone graft). Take hydrocodone for for pain. Risk of surgery includes risk of infection 1 in 100 patients, bleeding 1/2% chance you would need a transfusion.   Risk to the nerves is one in 10,000. You will need to use a brace for 3 months and wean from the brace on the 4th month. Expect improved walking and standing tolerance. Expect relief of leg pain but numbness may persist depending on the length and degree of pressure that has been present.    Follow-Up Instructions: No follow-ups on file.   Orders:  No orders of the defined types were placed in this encounter.  No orders of the defined types were placed in this encounter.     Procedures: No procedures performed   Clinical Data: No additional findings.   Subjective: Chief Complaint  Patient presents with  . Lower Back - Pain  . Right Leg - Pain    60 year old female with history of low back pain and radiation into the right leg. Pain is worse with standing and walking and with sitting for  Long periods. She has seen Dr. Dayton Bailiff in the past 2019 and had ESIs and TENs unit. She has been having  worsening pain and difficulty over the past 3 years. She is now using the buggy to grocery shop and has lost weight down to 291 from 338. She is hobbling with Standing and walking and when using a cart. Sitting she tends to lean on the left buttock and if she leans on the right she has more difficulty Going to stand. Uses a cane for ambulation in the house. She does stoop some but tries to avoid this to prevent it from becoming more permanent condition. She was given hydrocodone by Dr. Christella Noa in 2019 that has lasted her up to this December. Saw Dr. Lorin Mercy and was referred for consideration of lumbar fusion. 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 She can not walk for any length of time without pain into the back and the right leg. Has diabetes and is taking trulicity with good improvement recent BS 88, in the past was  Having BS in the 300s. HgbA1c in the past 13 then rechecked after increasing treatment was 7.8.   Review of Systems  Constitutional: Positive for activity change. Negative for appetite change, chills, diaphoresis, fatigue, fever and unexpected weight change (losing weight on purpose).  HENT: Negative for congestion, dental problem, drooling, ear discharge, ear pain, facial swelling, hearing loss, mouth sores,  nosebleeds, postnasal drip, rhinorrhea, sinus pressure, sinus pain, sneezing, sore throat, tinnitus, trouble swallowing and voice change.   Eyes: Negative.   Respiratory: Negative.   Cardiovascular: Negative.  Negative for chest pain, palpitations and leg swelling.  Gastrointestinal: Positive for constipation. Negative for abdominal distention, abdominal pain, anal bleeding, blood in stool, diarrhea, nausea, rectal pain and vomiting.  Endocrine: Negative.  Negative for cold intolerance, heat intolerance, polydipsia and polyphagia.  Genitourinary: Negative.  Negative for difficulty urinating, dyspareunia, dysuria and enuresis.  Musculoskeletal: Positive for  back pain. Negative for arthralgias, gait problem, joint swelling, myalgias, neck pain and neck stiffness.  Skin: Negative.  Negative for color change, pallor, rash and wound.  Allergic/Immunologic: Negative for environmental allergies, food allergies and immunocompromised state.  Neurological: Positive for weakness and numbness (Right anterior thigh and right anterior thigh). Negative for dizziness, tremors, seizures, syncope, facial asymmetry, speech difficulty, light-headedness and headaches.  Hematological: Negative.  Negative for adenopathy. Does not bruise/bleed easily.  Psychiatric/Behavioral: Negative.  Negative for agitation, behavioral problems, confusion, decreased concentration, dysphoric mood, hallucinations, self-injury, sleep disturbance and suicidal ideas. The patient is not nervous/anxious and is not hyperactive.      Objective: Vital Signs: BP 131/81 (BP Location: Left Arm, Patient Position: Sitting)   Pulse 67   Ht 5' 8.5" (1.74 m)   Wt 299 lb (135.6 kg)   BMI 44.80 kg/m   Physical Exam Constitutional:      Appearance: She is well-developed and well-nourished.  HENT:     Head: Normocephalic and atraumatic.  Eyes:     Extraocular Movements: EOM normal.     Pupils: Pupils are equal, round, and reactive to light.  Pulmonary:     Effort: Pulmonary effort is normal.     Breath sounds: Normal breath sounds.  Abdominal:     General: Bowel sounds are normal.     Palpations: Abdomen is soft.  Musculoskeletal:        General: Normal range of motion.     Cervical back: Normal range of motion and neck supple.  Skin:    General: Skin is warm and dry.  Neurological:     Mental Status: She is alert and oriented to person, place, and time.  Psychiatric:        Mood and Affect: Mood and affect normal.        Behavior: Behavior normal.        Thought Content: Thought content normal.        Judgment: Judgment normal.     Back Exam   Tenderness  The patient is  experiencing tenderness in the lumbar.      Specialty Comments:  No specialty comments available.  Imaging: No results found.   PMFS History: Patient Active Problem List   Diagnosis Date Noted  . Severe obstructive sleep apnea 11/09/2018  . Healthcare maintenance 11/09/2018  . At risk for obstructive sleep apnea 04/16/2018  . Acquired hypothyroidism 03/17/2018  . DOE (dyspnea on exertion) 03/16/2018  . NSTEMI (non-ST elevated myocardial infarction) ([place]) 08/18/2017  . Right sided sciatica 07/10/2017  . Right low back pain 07/10/2017  . Chest pain 09/29/2011  . Morbid obesity ([place]) 09/29/2011  . DM type 2 (diabetes mellitus, type 2) ([place]) 09/29/2011  . HTN (hypertension) 09/29/2011  . Pneumonia, bacterial 08/07/2010  . Incidental lung nodule, > 54mm and < 27mm 08/07/2010  . Bronchitis 08/07/2010   Past Medical History:  Diagnosis Date  . ALLERGIC RHINITIS   . 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  Asthma    Questionable  . Diabetes mellitus, type 2 ([place])   . History of tobacco abuse    Quit 2011  . Hypertension   . Hypertriglyceridemia   . Hypothyroidism   . NSTEMI (non-ST elevated myocardial infarction) ([place]) 09/30/11   NO CAD by cath, ? coronary vasospasm  . Obesity     Family History  Problem Relation Age of Onset  . Heart disease Mother        has pacemaker  . Diabetes Mother   . Diabetes Other        strong family history  . Cancer Maternal Uncle     Past Surgical History:  Procedure Laterality Date  . CORONARY STENT INTERVENTION N/A 08/19/2017   Procedure: CORONARY STENT INTERVENTION;  Surgeon: [name], MD;  Location: [facility];  Service: Cardiovascular;  Laterality: N/A;  . LEFT HEART CATH AND CORONARY ANGIOGRAPHY N/A 08/19/2017   Procedure: LEFT HEART CATH AND CORONARY ANGIOGRAPHY;  Surgeon: [name], MD;  Location: [facility];  Service: Cardiovascular;  Laterality: N/A;  . LEFT HEART CATHETERIZATION WITH CORONARY ANGIOGRAM N/A 09/30/2011    Procedure: LEFT HEART CATHETERIZATION WITH CORONARY ANGIOGRAM;  Surgeon: Burnell Blanks, MD;  Location: [facility] CATH LAB;  Service: Cardiovascular;  Laterality: N/A;  . TUBAL LIGATION    . VESICOVAGINAL FISTULA CLOSURE W/ TAH     Social History   Occupational History  . Occupation: Freight forwarder at [employer].  Tobacco Use  . Smoking status: Former Smoker    Packs/day: 0.80    Years: 36.00    Pack years: 28.80    Types: Cigarettes    Quit date: 02/05/2011    Years since quitting: 9.0  . Smokeless tobacco: Never Used  Vaping Use  . Vaping Use: Never used  Substance and Sexual Activity  . Alcohol use: Yes    Comment: VERY RARE  . Drug use: No  . Sexual activity: Not on file

## 2020-03-16 ENCOUNTER — Ambulatory Visit (INDEPENDENT_AMBULATORY_CARE_PROVIDER_SITE_OTHER): Payer: Medicare HMO

## 2020-03-16 ENCOUNTER — Encounter: Payer: Self-pay | Admitting: Specialist

## 2020-03-16 ENCOUNTER — Ambulatory Visit (INDEPENDENT_AMBULATORY_CARE_PROVIDER_SITE_OTHER): Payer: Medicare HMO | Admitting: Specialist

## 2020-03-16 ENCOUNTER — Other Ambulatory Visit: Payer: Self-pay

## 2020-03-16 VITALS — BP 169/81 | HR 86 | Ht 68.5 in | Wt 295.0 lb

## 2020-03-16 DIAGNOSIS — M25559 Pain in unspecified hip: Secondary | ICD-10-CM

## 2020-03-16 DIAGNOSIS — M48062 Spinal stenosis, lumbar region with neurogenic claudication: Secondary | ICD-10-CM

## 2020-03-16 DIAGNOSIS — M4316 Spondylolisthesis, lumbar region: Secondary | ICD-10-CM

## 2020-03-16 DIAGNOSIS — M1611 Unilateral primary osteoarthritis, right hip: Secondary | ICD-10-CM | POA: Diagnosis not present

## 2020-03-16 DIAGNOSIS — M5136 Other intervertebral disc degeneration, lumbar region: Secondary | ICD-10-CM | POA: Diagnosis not present

## 2020-03-16 DIAGNOSIS — M51369 Other intervertebral disc degeneration, lumbar region without mention of lumbar back pain or lower extremity pain: Secondary | ICD-10-CM

## 2020-03-16 DIAGNOSIS — M25551 Pain in right hip: Secondary | ICD-10-CM

## 2020-03-16 NOTE — Patient Instructions (Addendum)
Plan: Avoid bending, stooping and avoid lifting weights greater than 10 lbs. Avoid prolong standing and walking. Order for a new walker with wheels. Surgery scheduling secretary Kandice Hams, will call you in the next week to schedule for surgery.  Surgery recommended is a one level lumbar fusion L4-5 this would be done with rods, screws and cages with local bone graft and allograft (donor bone graft). Take hydrocodone for for pain. Risk of surgery includes risk of infection 1 in 100 patients, bleeding 1/2% chance you would need a transfusion.   Risk to the nerves is one in 10,000. You will need to use a brace for 3 months and wean from the brace on the 4th month. Expect improved walking and standing tolerance. Expect relief of leg pain but numbness may persist depending on the length and degree of pressure that has been present. Referral to Dr. Ninfa Linden for right hip evaluation and consideration of right total hip replacement.

## 2020-03-16 NOTE — Progress Notes (Signed)
Office Visit Note   Patient: [name]           Date of Birth: [birth year]           MRN: [id number] Visit Date: 03/16/2020              Requested by: Gevena Mart, DO [address] [place] [address] PCP: Gevena Mart, DO   Assessment & Plan: Visit Diagnoses:  1. Spinal stenosis of lumbar region with neurogenic claudication   2. Spondylolisthesis, lumbar region   3. Degenerative disc disease, lumbar   4. Hip pain   5. Unilateral primary osteoarthritis, right hip     Plan: Avoid bending, stooping and avoid lifting weights greater than 10 lbs. Avoid prolong standing and walking. Order for a new walker with wheels. Surgery scheduling secretary Kandice Hams, will call you in the next week to schedule for surgery.  Surgery recommended is a one level lumbar fusion L4-5 this would be done with rods, screws and cages with local bone graft and allograft (donor bone graft). Take hydrocodone for for pain. Risk of surgery includes risk of infection 1 in 100 patients, bleeding 1/2% chance you would need a transfusion.   Risk to the nerves is one in 10,000. You will need to use a brace for 3 months and wean from the brace on the 4th month. Expect improved walking and standing tolerance. Expect relief of leg pain but numbness may persist depending on the length and degree of pressure that has been present. Referral to Dr. Ninfa Linden for right hip evaluation and consideration of right total hip replacement.   Follow-Up Instructions: Return in about 4 weeks (around 04/13/2020) for Make an appointment to see Dr.Blackman in 2-3 weeks for evaluation of her right hip for OA..   Orders:  Orders Placed This Encounter  Procedures  . XR HIP UNILAT W OR W/O PELVIS 2-3 VIEWS RIGHT   No orders of the defined types were placed in this encounter.     Procedures: No procedures performed   Clinical Data: No additional findings.   Subjective: Chief Complaint  Patient  presents with  . Lower Back - Follow-up    60  Year old female with right hip and groin pain and difficulty with standing and walking. She notes today increased  Weakness right hip flexion. Pain with standing and walking on the right hip. Plain radiographs from 2 years ago demonstrate Early DJD right hip. Plain radiographs of the lumbar spine flexion and extension demonstrate L4-5 Grade 1-2 anterolisthesis Worse with flexion. She has both pain with bending and stooping and lifting and with standing and walking.    Review of Systems  Constitutional: Negative.   HENT: Negative.   Eyes: Negative.   Respiratory: Negative.   Cardiovascular: Negative.   Gastrointestinal: Negative.   Endocrine: Negative.   Genitourinary: Negative.   Musculoskeletal: Negative.   Skin: Negative.   Allergic/Immunologic: Negative.   Neurological: Negative.   Hematological: Negative.   Psychiatric/Behavioral: Negative.      Objective: Vital Signs: BP (!) 169/81 (BP Location: Left Arm, Patient Position: Sitting)   Pulse 86   Ht 5' 8.5" (1.74 m)   Wt 295 lb (133.8 kg)   BMI 44.20 kg/m   Physical Exam Musculoskeletal:     Lumbar back: Negative right straight leg raise test and negative left straight leg raise test.     Back Exam   Tenderness  The patient is experiencing tenderness in  the lumbar.  Range of Motion  Extension: abnormal  Flexion: abnormal  Lateral bend right: abnormal  Lateral bend left: abnormal  Rotation right: abnormal  Rotation left: abnormal   Muscle Strength  Right Quadriceps:  5/5  Left Quadriceps:  5/5  Right Hamstrings:  5/5  Left Hamstrings:  5/5   Tests  Straight leg raise right: negative Straight leg raise left: negative      Specialty Comments:  No specialty comments available.  Imaging: XR HIP UNILAT W OR W/O PELVIS 2-3 VIEWS RIGHT  Result Date: 03/16/2020 Ap standing of the pelvis and lateral right hip demonstrates right hip osteoarthritis with  narrowing of the superolateral right hip joint line and more laterally.    PMFS History: Patient Active Problem List   Diagnosis Date Noted  . Severe obstructive sleep apnea 11/09/2018  . Healthcare maintenance 11/09/2018  . At risk for obstructive sleep apnea 04/16/2018  . Acquired hypothyroidism 03/17/2018  . DOE (dyspnea on exertion) 03/16/2018  . NSTEMI (non-ST elevated myocardial infarction) ([facility]) 08/18/2017  . Right sided sciatica 07/10/2017  . Right low back pain 07/10/2017  . Chest pain 09/29/2011  . Morbid obesity ([facility]) 09/29/2011  . DM type 2 (diabetes mellitus, type 2) ([facility]) 09/29/2011  . HTN (hypertension) 09/29/2011  . Pneumonia, bacterial 08/07/2010  . Incidental lung nodule, > 94mm and < 41mm 08/07/2010  . Bronchitis 08/07/2010   Past Medical History:  Diagnosis Date  . ALLERGIC RHINITIS   . 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 Asthma    Questionable  . Diabetes mellitus, type 2 ([facility])   . History of tobacco abuse    Quit 2011  . Hypertension   . Hypertriglyceridemia   . Hypothyroidism   . NSTEMI (non-ST elevated myocardial infarction) ([facility]) 09/30/11   NO CAD by cath, ? coronary vasospasm  . Obesity     Family History  Problem Relation Age of Onset  . Heart disease Mother        has pacemaker  . Diabetes Mother   . Diabetes Other        strong family history  . Cancer Maternal Uncle     Past Surgical History:  Procedure Laterality Date  . CORONARY STENT INTERVENTION N/A 08/19/2017   Procedure: CORONARY STENT INTERVENTION;  Surgeon: [name], MD;  Location: [facility];  Service: Cardiovascular;  Laterality: N/A;  . LEFT HEART CATH AND CORONARY ANGIOGRAPHY N/A 08/19/2017   Procedure: LEFT HEART CATH AND CORONARY ANGIOGRAPHY;  Surgeon: [name], MD;  Location: [facility];  Service: Cardiovascular;  Laterality: N/A;  . LEFT HEART CATHETERIZATION WITH CORONARY ANGIOGRAM N/A 09/30/2011   Procedure: LEFT HEART CATHETERIZATION WITH CORONARY ANGIOGRAM;   Surgeon: Burnell Blanks, MD;  Location: [facility] CATH [facility];  Service: Cardiovascular;  Laterality: N/A;  . TUBAL LIGATION    . VESICOVAGINAL FISTULA CLOSURE W/ TAH     Social History   Occupational History  . Occupation: Freight forwarder at [employer].  Tobacco Use  . Smoking status: Former Smoker    Packs/day: 0.80    Years: 36.00    Pack years: 28.80    Types: Cigarettes    Quit date: 02/05/2011    Years since quitting: 9.1  . Smokeless tobacco: Never Used  Vaping Use  . Vaping Use: Never used  Substance and Sexual Activity  . Alcohol use: Yes    Comment: VERY RARE  . Drug use: No  . Sexual activity: Not on file

## 2020-03-28 ENCOUNTER — Ambulatory Visit (INDEPENDENT_AMBULATORY_CARE_PROVIDER_SITE_OTHER): Payer: Medicare HMO | Admitting: Orthopaedic Surgery

## 2020-03-28 ENCOUNTER — Encounter: Payer: Self-pay | Admitting: Orthopaedic Surgery

## 2020-03-28 VITALS — Ht 68.5 in | Wt 297.0 lb

## 2020-03-28 DIAGNOSIS — M1611 Unilateral primary osteoarthritis, right hip: Secondary | ICD-10-CM | POA: Diagnosis not present

## 2020-03-28 NOTE — Progress Notes (Signed)
Office Visit Note   Patient: [name]           Date of Birth: 16-Aug-1960           MRN: [id number] Visit Date: 03/28/2020              Requested by: Gevena Mart, DO [address] [place] [address] PCP: Gevena Mart, DO   Assessment & Plan: Visit Diagnoses:  1. Unilateral primary osteoarthritis, right hip     Plan: I was able to get the patient a handout for hip replacement surgery.  I showed her hip model and went over her x-rays and described in detail what the surgery involves.  She will need to continue to get a blood glucose under control and her weight down before we can proceed with any surgical intervention.  I would like to see her back in 3 months for repeat weight and BMI calculation.  All questions and concerns were answered and addressed.The patient meets the AMA guidelines for Morbid (severe) obesity with a BMI > 40.0 and I have recommended weight loss.  Follow-Up Instructions: Return in about 3 months (around 06/25/2020).   Orders:  No orders of the defined types were placed in this encounter.  No orders of the defined types were placed in this encounter.     Procedures: No procedures performed   Clinical Data: No additional findings.   Subjective: Chief Complaint  Patient presents with  . Right Hip - Pain  The patient is a very pleasant 60 year old female sent to me from Dr. Louanne Skye to evaluate and treat right hip osteoarthritis.  She does have x-rays confirming osteoarthritis of the right hip and she does get pain in her groin.  She walks with a limp and does use a cane to offload that hip as she ambulates.  This is been getting worse over 12 months now in terms of the pain she is having.  She is also followed by Dr. Louanne Skye for her lumbar spine.  She does have weakness in the leg and numbness and tingling in the thigh on the right side and a recent ESI did not help.  She is a diabetic and has had poor diabetic control for years with a  hemoglobin A1c she said at one time was close to 17 oh more recently was just above 8.  She is scheduled to have that checked again in March.  Her BMI is 44.5.  Her weight is 297 pounds.  She has lost at least 100 pounds according to her of her last year or more.  She is not a smoker anymore but used to be a smoker.  HPI  Review of Systems She currently denies any headache, chest pain, shortness of breath, fever, chills, nausea, vomiting  Objective: Vital Signs: Ht 5' 8.5" (1.74 m)   Wt 297 lb (134.7 kg)   BMI 44.50 kg/m   Physical Exam She is alert and oriented x3 and in no acute distress Ortho Exam Examination of her right hip shows pain with internal and external rotation of the groin.  There is some stiffness with rotation of the right hip as well.  The left hip moves smoothly with no issues. Specialty Comments:  No specialty comments available.  Imaging: No results found. An AP pelvis and lateral of the right hip were reviewed with her that were done previously.  It does show significant joint space narrowing on the right side when comparing  the right and left sides with sclerotic changes and periarticular osteophytes.  PMFS History: Patient Active Problem List   Diagnosis Date Noted  . Severe obstructive sleep apnea 11/09/2018  . Healthcare maintenance 11/09/2018  . At risk for obstructive sleep apnea 04/16/2018  . Acquired hypothyroidism 03/17/2018  . DOE (dyspnea on exertion) 03/16/2018  . NSTEMI (non-ST elevated myocardial infarction) ([facility]) 08/18/2017  . Right sided sciatica 07/10/2017  . Right low back pain 07/10/2017  . Chest pain 09/29/2011  . Morbid obesity ([facility]) 09/29/2011  . DM type 2 (diabetes mellitus, type 2) ([facility]) 09/29/2011  . HTN (hypertension) 09/29/2011  . Pneumonia, bacterial 08/07/2010  . Incidental lung nodule, > 41mm and < 3mm 08/07/2010  . Bronchitis 08/07/2010   Past Medical History:  Diagnosis Date  . ALLERGIC RHINITIS   . Allergy to IVP dye    . Asthma    Questionable  . Diabetes mellitus, type 2 ([facility])   . History of tobacco abuse    Quit 2011  . Hypertension   . Hypertriglyceridemia   . Hypothyroidism   . NSTEMI (non-ST elevated myocardial infarction) ([facility]) 09/30/11   NO CAD by cath, ? coronary vasospasm  . Obesity     Family History  Problem Relation Age of Onset  . Heart disease Mother        has pacemaker  . Diabetes Mother   . Diabetes Other        strong family history  . Cancer Maternal Uncle     Past Surgical History:  Procedure Laterality Date  . CORONARY STENT INTERVENTION N/A 08/19/2017   Procedure: CORONARY STENT INTERVENTION;  Surgeon: [name], MD;  Location: [facility];  Service: Cardiovascular;  Laterality: N/A;  . LEFT HEART CATH AND CORONARY ANGIOGRAPHY N/A 08/19/2017   Procedure: LEFT HEART CATH AND CORONARY ANGIOGRAPHY;  Surgeon: [name], MD;  Location: [facility];  Service: Cardiovascular;  Laterality: N/A;  . LEFT HEART CATHETERIZATION WITH CORONARY ANGIOGRAM N/A 09/30/2011   Procedure: LEFT HEART CATHETERIZATION WITH CORONARY ANGIOGRAM;  Surgeon: Burnell Blanks, MD;  Location: [facility];  Service: Cardiovascular;  Laterality: N/A;  . TUBAL LIGATION    . VESICOVAGINAL FISTULA CLOSURE W/ TAH     Social History   Occupational History  . Occupation: Freight forwarder at [employer].  Tobacco Use  . Smoking status: Former Smoker    Packs/day: 0.80    Years: 36.00    Pack years: 28.80    Types: Cigarettes    Quit date: 02/05/2011    Years since quitting: 9.1  . Smokeless tobacco: Never Used  Vaping Use  . Vaping Use: Never used  Substance and Sexual Activity  . Alcohol use: Yes    Comment: VERY RARE  . Drug use: No  . Sexual activity: Not on file

## 2020-04-10 DIAGNOSIS — E1165 Type 2 diabetes mellitus with hyperglycemia: Secondary | ICD-10-CM | POA: Diagnosis not present

## 2020-04-10 DIAGNOSIS — Z720 Tobacco use: Secondary | ICD-10-CM | POA: Diagnosis not present

## 2020-04-10 DIAGNOSIS — I2510 Atherosclerotic heart disease of native coronary artery without angina pectoris: Secondary | ICD-10-CM | POA: Diagnosis not present

## 2020-04-10 DIAGNOSIS — E119 Type 2 diabetes mellitus without complications: Secondary | ICD-10-CM | POA: Diagnosis not present

## 2020-04-10 DIAGNOSIS — Z794 Long term (current) use of insulin: Secondary | ICD-10-CM | POA: Diagnosis not present

## 2020-04-10 DIAGNOSIS — E785 Hyperlipidemia, unspecified: Secondary | ICD-10-CM | POA: Diagnosis not present

## 2020-04-10 DIAGNOSIS — E039 Hypothyroidism, unspecified: Secondary | ICD-10-CM | POA: Diagnosis not present

## 2020-04-10 DIAGNOSIS — R2232 Localized swelling, mass and lump, left upper limb: Secondary | ICD-10-CM | POA: Diagnosis not present

## 2020-04-10 DIAGNOSIS — I10 Essential (primary) hypertension: Secondary | ICD-10-CM | POA: Diagnosis not present

## 2020-04-13 DIAGNOSIS — I2510 Atherosclerotic heart disease of native coronary artery without angina pectoris: Secondary | ICD-10-CM | POA: Diagnosis not present

## 2020-04-13 DIAGNOSIS — E039 Hypothyroidism, unspecified: Secondary | ICD-10-CM | POA: Diagnosis not present

## 2020-04-13 DIAGNOSIS — E1165 Type 2 diabetes mellitus with hyperglycemia: Secondary | ICD-10-CM | POA: Diagnosis not present

## 2020-04-13 DIAGNOSIS — Z794 Long term (current) use of insulin: Secondary | ICD-10-CM | POA: Diagnosis not present

## 2020-04-13 DIAGNOSIS — N179 Acute kidney failure, unspecified: Secondary | ICD-10-CM | POA: Diagnosis not present

## 2020-04-13 DIAGNOSIS — E785 Hyperlipidemia, unspecified: Secondary | ICD-10-CM | POA: Diagnosis not present

## 2020-04-13 DIAGNOSIS — Z720 Tobacco use: Secondary | ICD-10-CM | POA: Diagnosis not present

## 2020-04-13 DIAGNOSIS — I10 Essential (primary) hypertension: Secondary | ICD-10-CM | POA: Diagnosis not present

## 2020-04-14 ENCOUNTER — Other Ambulatory Visit: Payer: Self-pay

## 2020-04-14 ENCOUNTER — Ambulatory Visit (INDEPENDENT_AMBULATORY_CARE_PROVIDER_SITE_OTHER): Payer: Medicare HMO | Admitting: Specialist

## 2020-04-14 ENCOUNTER — Encounter: Payer: Self-pay | Admitting: Specialist

## 2020-04-14 VITALS — BP 134/62 | HR 83 | Ht 68.5 in | Wt 297.0 lb

## 2020-04-14 DIAGNOSIS — M5136 Other intervertebral disc degeneration, lumbar region: Secondary | ICD-10-CM

## 2020-04-14 DIAGNOSIS — M1611 Unilateral primary osteoarthritis, right hip: Secondary | ICD-10-CM

## 2020-04-14 DIAGNOSIS — M48062 Spinal stenosis, lumbar region with neurogenic claudication: Secondary | ICD-10-CM | POA: Diagnosis not present

## 2020-04-14 DIAGNOSIS — M4316 Spondylolisthesis, lumbar region: Secondary | ICD-10-CM | POA: Diagnosis not present

## 2020-04-14 NOTE — Progress Notes (Signed)
Office Visit Note   Patient: [name]           Date of Birth: Jan 02, 1961           MRN: [id number] Visit Date: 04/14/2020              Requested by: Gevena Mart, DO [address] [place] [address] PCP: Gevena Mart, DO   Assessment & Plan: Visit Diagnoses:  1. Spondylolisthesis, lumbar region   2. Unilateral primary osteoarthritis, right hip   3. Spinal stenosis of lumbar region with neurogenic claudication   4. Degenerative disc disease, lumbar     Plan: Avoid bending, stooping and avoid lifting weights greater than 10 lbs. Avoid prolong standing and walking. Order for a new walker with wheels. Surgery scheduling secretary Kandice Hams, will call you in the next week to schedule for surgery.  Surgery recommended is a one level lumbar fusion L4-5 this would be done with rods, screws and cages with local bone graft and allograft (donor bone graft). Take hydrocodone for for pain. Risk of surgery includes risk of infection 1 in 200 patients, bleeding 1/4% chance you would need a transfusion.   Risk to the nerves is one in 10,000. You will need to use a brace for 3 months and wean from the brace on the 4th month. Expect improved walking and standing tolerance. Expect relief of leg pain but numbness may persist depending on the length and degree of pressure that has been present.   Follow-Up Instructions: No follow-ups on file.   Orders:  No orders of the defined types were placed in this encounter.  No orders of the defined types were placed in this encounter.     Procedures: No procedures performed   Clinical Data: No additional findings.   Subjective: Chief Complaint  Patient presents with  . Lower Back - Pain    60 year old female with history of back pain and radiation into the right leg. She has pain with standing and walking and complaints of numbness and tingling into the whole right foot.  Some constipation. No bladder  difficulty. Using a stool softener and plenty of greens. She is limited in standing and walking. This past weekend went to a flea market and walked for about 20 minutes and had severe pain and had to sit to relieve the pain.    Review of Systems  Constitutional: Negative.   HENT: Negative.   Eyes: Negative.   Respiratory: Negative.   Cardiovascular: Negative.   Gastrointestinal: Negative.   Endocrine: Negative.   Genitourinary: Negative.   Musculoskeletal: Negative.   Skin: Negative.   Allergic/Immunologic: Negative.   Neurological: Negative.   Hematological: Negative.   Psychiatric/Behavioral: Negative.      Objective: Vital Signs: BP 134/62 (BP Location: Left Arm, Patient Position: Sitting)   Pulse 83   Ht 5' 8.5" (1.74 m)   Wt 297 lb (134.7 kg)   BMI 44.50 kg/m   Physical Exam Constitutional:      Appearance: She is well-developed.  HENT:     Head: Normocephalic and atraumatic.  Eyes:     Pupils: Pupils are equal, round, and reactive to light.  Pulmonary:     Effort: Pulmonary effort is normal.     Breath sounds: Normal breath sounds.  Abdominal:     General: Bowel sounds are normal.     Palpations: Abdomen is soft.  Musculoskeletal:     Cervical back: Normal range  of motion and neck supple.  Skin:    General: Skin is warm and dry.  Neurological:     Mental Status: She is alert and oriented to person, place, and time.  Psychiatric:        Behavior: Behavior normal.        Thought Content: Thought content normal.        Judgment: Judgment normal.     Back Exam   Tenderness  The patient is experiencing tenderness in the lumbar.  Range of Motion  Extension: abnormal  Flexion: abnormal   Muscle Strength  Right Quadriceps:  4/5  Left Quadriceps:  5/5  Right Hamstrings:  5/5  Left Hamstrings:  5/5       Specialty Comments:  No specialty comments available.  Imaging: No results found.   PMFS History: Patient Active Problem List    Diagnosis Date Noted  . Severe obstructive sleep apnea 11/09/2018  . Healthcare maintenance 11/09/2018  . At risk for obstructive sleep apnea 04/16/2018  . Acquired hypothyroidism 03/17/2018  . DOE (dyspnea on exertion) 03/16/2018  . NSTEMI (non-ST elevated myocardial infarction) ([facility]) 08/18/2017  . Right sided sciatica 07/10/2017  . Right low back pain 07/10/2017  . Chest pain 09/29/2011  . Morbid obesity ([place]) 09/29/2011  . DM type 2 (diabetes mellitus, type 2) ([place]) 09/29/2011  . HTN (hypertension) 09/29/2011  . Pneumonia, bacterial 08/07/2010  . Incidental lung nodule, > 48mm and < 16mm 08/07/2010  . Bronchitis 08/07/2010   Past Medical History:  Diagnosis Date  . ALLERGIC RHINITIS   . 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 Asthma    Questionable  . Diabetes mellitus, type 2 ([place])   . History of tobacco abuse    Quit 2011  . Hypertension   . Hypertriglyceridemia   . Hypothyroidism   . NSTEMI (non-ST elevated myocardial infarction) ([facility]) 09/30/11   NO CAD by cath, ? coronary vasospasm  . Obesity     Family History  Problem Relation Age of Onset  . Heart disease Mother        has pacemaker  . Diabetes Mother   . Diabetes Other        strong family history  . Cancer Maternal Uncle     Past Surgical History:  Procedure Laterality Date  . CORONARY STENT INTERVENTION N/A 08/19/2017   Procedure: CORONARY STENT INTERVENTION;  Surgeon: [name], MD;  Location: [facility];  Service: Cardiovascular;  Laterality: N/A;  . LEFT HEART CATH AND CORONARY ANGIOGRAPHY N/A 08/19/2017   Procedure: LEFT HEART CATH AND CORONARY ANGIOGRAPHY;  Surgeon: [name], MD;  Location: [facility];  Service: Cardiovascular;  Laterality: N/A;  . LEFT HEART CATHETERIZATION WITH CORONARY ANGIOGRAM N/A 09/30/2011   Procedure: LEFT HEART CATHETERIZATION WITH CORONARY ANGIOGRAM;  Surgeon: Burnell Blanks, MD;  Location: [facility];  Service: Cardiovascular;  Laterality: N/A;  . TUBAL LIGATION     . VESICOVAGINAL FISTULA CLOSURE W/ TAH     Social History   Occupational History  . Occupation: Freight forwarder at [employer].  Tobacco Use  . Smoking status: Former Smoker    Packs/day: 0.80    Years: 36.00    Pack years: 28.80    Types: Cigarettes    Quit date: 02/05/2011    Years since quitting: 9.1  . Smokeless tobacco: Never Used  Vaping Use  . Vaping Use: Never used  Substance and Sexual Activity  . Alcohol use: Yes    Comment: VERY RARE  .  Drug use: No  . Sexual activity: Not on file       

## 2020-04-14 NOTE — Patient Instructions (Signed)
Avoid bending, stooping and avoid lifting weights greater than 10 lbs. Avoid prolong standing and walking. Order for a new walker with wheels. Surgery scheduling secretary Kandice Hams, will call you in the next week to schedule for surgery.  Surgery recommended is a one level lumbar fusion L4-5 this would be done with rods, screws and cages with local bone graft and allograft (donor bone graft). Take hydrocodone for for pain. Risk of surgery includes risk of infection 1 in 200 patients, bleeding 1/4% chance you would need a transfusion.   Risk to the nerves is one in 10,000. You will need to use a brace for 3 months and wean from the brace on the 4th month. Expect improved walking and standing tolerance. Expect relief of leg pain but numbness may persist depending on the length and degree of pressure that has been present.

## 2020-04-21 DIAGNOSIS — E039 Hypothyroidism, unspecified: Secondary | ICD-10-CM | POA: Diagnosis not present

## 2020-04-21 DIAGNOSIS — E785 Hyperlipidemia, unspecified: Secondary | ICD-10-CM | POA: Diagnosis not present

## 2020-04-21 DIAGNOSIS — Z720 Tobacco use: Secondary | ICD-10-CM | POA: Diagnosis not present

## 2020-04-21 DIAGNOSIS — I2510 Atherosclerotic heart disease of native coronary artery without angina pectoris: Secondary | ICD-10-CM | POA: Diagnosis not present

## 2020-04-21 DIAGNOSIS — I10 Essential (primary) hypertension: Secondary | ICD-10-CM | POA: Diagnosis not present

## 2020-04-21 DIAGNOSIS — E1165 Type 2 diabetes mellitus with hyperglycemia: Secondary | ICD-10-CM | POA: Diagnosis not present

## 2020-04-21 DIAGNOSIS — Z794 Long term (current) use of insulin: Secondary | ICD-10-CM | POA: Diagnosis not present

## 2020-04-21 DIAGNOSIS — N179 Acute kidney failure, unspecified: Secondary | ICD-10-CM | POA: Diagnosis not present

## 2020-05-03 ENCOUNTER — Ambulatory Visit: Payer: Medicare HMO | Admitting: Dietician

## 2020-05-12 ENCOUNTER — Other Ambulatory Visit: Payer: Self-pay

## 2020-05-12 ENCOUNTER — Ambulatory Visit (INDEPENDENT_AMBULATORY_CARE_PROVIDER_SITE_OTHER): Payer: Medicare HMO | Admitting: Specialist

## 2020-05-12 ENCOUNTER — Encounter: Payer: Self-pay | Admitting: Specialist

## 2020-05-12 VITALS — BP 154/81 | HR 83 | Ht 68.5 in | Wt 296.4 lb

## 2020-05-12 DIAGNOSIS — M4316 Spondylolisthesis, lumbar region: Secondary | ICD-10-CM

## 2020-05-12 DIAGNOSIS — M5136 Other intervertebral disc degeneration, lumbar region: Secondary | ICD-10-CM | POA: Diagnosis not present

## 2020-05-12 DIAGNOSIS — M48062 Spinal stenosis, lumbar region with neurogenic claudication: Secondary | ICD-10-CM | POA: Diagnosis not present

## 2020-05-12 NOTE — Patient Instructions (Addendum)
Plan: Avoid bending, stooping and avoid lifting weights greater than 10 lbs. Avoid prolong standing and walking. Order for a new walker with wheels. Surgery scheduling secretary Kandice Hams, will call you in the next week to schedule for surgery.  Surgery recommended is a one level lumbar fusion L4-5 this would be done with rods, screws and cages with local bone graft and allograft (donor bone graft). Take hydrocodone for for pain. Risk of surgery includes risk of infection 1 in 200 patients, bleeding 1/4% chance you would need a transfusion.   Risk to the nerves is one in 10,000. You will need to use a brace for 3 months and wean from the brace on the 4th month. Expect improved walking and standing tolerance. Expect relief of leg pain but numbness may persist depending on the length and degree of pressure that has been present. Need your primary care physician's note that surgery is cleared from their stand point. I recommend that we have Dr.Ganji see you for approval or clearance if you do not prefer Dr. Maryan Rued.

## 2020-05-12 NOTE — Progress Notes (Signed)
Office Visit Note   Patient: [name]           Date of Birth: 08-30-1960           MRN: [id number] Visit Date: 05/12/2020              Requested by: Gevena Mart, DO [address] [place] [address] PCP: Gevena Mart, DO   Assessment & Plan: Visit Diagnoses:  1. Spondylolisthesis, lumbar region   2. Degenerative disc disease, lumbar   3. Spinal stenosis of lumbar region with neurogenic claudication     Plan: Plan: Avoid bending, stooping and avoid lifting weights greater than 10 lbs. Avoid prolong standing and walking. Order for a new walker with wheels. Surgery scheduling secretary Kandice Hams, will call you in the next week to schedule for surgery.  Surgery recommended is a one level lumbar fusion L4-5 this would be done with rods, screws and cages with local bone graft and allograft (donor bone graft). Take hydrocodone for for pain. Risk of surgery includes risk of infection 1 in 200 patients, bleeding 1/4% chance you would need a transfusion.   Risk to the nerves is one in 10,000. You will need to use a brace for 3 months and wean from the brace on the 4th month. Expect improved walking and standing tolerance. Expect relief of leg pain but numbness may persist depending on the length and degree of pressure that has been present.  Follow-Up Instructions: No follow-ups on file.   Orders:  No orders of the defined types were placed in this encounter.  No orders of the defined types were placed in this encounter.     Procedures: No procedures performed   Clinical Data: No additional findings.   Subjective: Chief Complaint  Patient presents with  . Lower Back - Follow-up  . Right Hip - Follow-up    60 year old female with history L4-5 anterolisthesis with neurogenic claudication she is to have a lumbar fusion but so far not able to  Obtain clearance from her primary care Dr. Vista Lawman and Dr. Maryan Rued. She has been seeing Dr.  Vista Lawman since one year ago and  Dr. Maryan Rued cardiology has been seeing her only recently and previously she had seen Dr. Einar Gip who did the stent procedure for her heart. She feels like the back is worsening with more constant pain and leg discomfort.    Review of Systems  Constitutional: Negative.   HENT: Negative.   Eyes: Negative.   Respiratory: Negative.   Cardiovascular: Negative.   Gastrointestinal: Negative.   Endocrine: Negative.   Genitourinary: Negative.   Musculoskeletal: Negative.   Skin: Negative.   Allergic/Immunologic: Negative.   Neurological: Negative.   Hematological: Negative.   Psychiatric/Behavioral: Negative.      Objective: Vital Signs: BP (!) 154/81   Pulse 83   Ht 5' 8.5" (1.74 m)   Wt 296 lb 6.4 oz (134.4 kg)   BMI 44.41 kg/m   Physical Exam Constitutional:      Appearance: She is well-developed.  HENT:     Head: Normocephalic and atraumatic.  Eyes:     Pupils: Pupils are equal, round, and reactive to light.  Pulmonary:     Effort: Pulmonary effort is normal.     Breath sounds: Normal breath sounds.  Abdominal:     General: Bowel sounds are normal.     Palpations: Abdomen is soft.  Musculoskeletal:     Cervical back: Normal range  of motion and neck supple.     Lumbar back: Positive right straight leg raise test. Negative left straight leg raise test.  Skin:    General: Skin is warm and dry.  Neurological:     Mental Status: She is alert and oriented to person, place, and time.  Psychiatric:        Behavior: Behavior normal.        Thought Content: Thought content normal.        Judgment: Judgment normal.     Back Exam   Tenderness  The patient is experiencing tenderness in the lumbar.  Range of Motion  Extension: abnormal  Flexion: abnormal  Lateral bend right: abnormal  Lateral bend left: abnormal  Rotation right: abnormal  Rotation left: abnormal   Muscle Strength  Right Quadriceps:  4/5  Left Quadriceps:  5/5   Right Hamstrings:  5/5  Left Hamstrings:  5/5   Tests  Straight leg raise right: positive Straight leg raise left: negative  Reflexes  Patellar: 2/4 Achilles: 2/4  Other  Toe walk: normal Heel walk: normal Sensation: normal Gait: normal  Erythema: no back redness Scars: absent  Comments:  EHL 4/5 Right foot DF 4/5 SLR positive right at 45 degrees.       Specialty Comments:  No specialty comments available.  Imaging: No results found.   PMFS History: Patient Active Problem List   Diagnosis Date Noted  . Severe obstructive sleep apnea 11/09/2018  . Healthcare maintenance 11/09/2018  . At risk for obstructive sleep apnea 04/16/2018  . Acquired hypothyroidism 03/17/2018  . DOE (dyspnea on exertion) 03/16/2018  . NSTEMI (non-ST elevated myocardial infarction) ([place]) 08/18/2017  . Right sided sciatica 07/10/2017  . Right low back pain 07/10/2017  . Chest pain 09/29/2011  . Morbid obesity ([place]) 09/29/2011  . DM type 2 (diabetes mellitus, type 2) ([place]) 09/29/2011  . HTN (hypertension) 09/29/2011  . Pneumonia, bacterial 08/07/2010  . Incidental lung nodule, > 11mm and < 65mm 08/07/2010  . Bronchitis 08/07/2010   Past Medical History:  Diagnosis Date  . ALLERGIC RHINITIS   . 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 Asthma    Questionable  . Diabetes mellitus, type 2 ([place])   . History of tobacco abuse    Quit 2011  . Hypertension   . Hypertriglyceridemia   . Hypothyroidism   . NSTEMI (non-ST elevated myocardial infarction) ([place]) 09/30/11   NO CAD by cath, ? coronary vasospasm  . Obesity     Family History  Problem Relation Age of Onset  . Heart disease Mother        has pacemaker  . Diabetes Mother   . Diabetes Other        strong family history  . Cancer Maternal Uncle     Past Surgical History:  Procedure Laterality Date  . CORONARY STENT INTERVENTION N/A 08/19/2017   Procedure: CORONARY STENT INTERVENTION;  Surgeon: [name], MD;  Location: [facility];   Service: Cardiovascular;  Laterality: N/A;  . LEFT HEART CATH AND CORONARY ANGIOGRAPHY N/A 08/19/2017   Procedure: LEFT HEART CATH AND CORONARY ANGIOGRAPHY;  Surgeon: [name], MD;  Location: [facility];  Service: Cardiovascular;  Laterality: N/A;  . LEFT HEART CATHETERIZATION WITH CORONARY ANGIOGRAM N/A 09/30/2011   Procedure: LEFT HEART CATHETERIZATION WITH CORONARY ANGIOGRAM;  Surgeon: Burn[place] Blanks, MD;  Location: [facility] CATH [facility];  Service: Cardiovascular;  Laterality: N/A;  . TUBAL LIGATION    . VESICOVAGINAL FISTULA CLOSURE  W/ TAH     Social History   Occupational History  . Occupation: Freight forwarder at rest.  Tobacco Use  . Smoking status: Former Smoker    Packs/day: 0.80    Years: 36.00    Pack years: 28.80    Types: Cigarettes    Quit date: 02/05/2011    Years since quitting: 9.2  . Smokeless tobacco: Never Used  Vaping Use  . Vaping Use: Never used  Substance and Sexual Activity  . Alcohol use: Yes    Comment: VERY RARE  . Drug use: No  . Sexual activity: Not on file

## 2020-05-29 ENCOUNTER — Ambulatory Visit: Payer: Medicare HMO | Admitting: Dietician

## 2020-06-01 ENCOUNTER — Encounter: Payer: Self-pay | Admitting: Cardiology

## 2020-06-01 ENCOUNTER — Other Ambulatory Visit: Payer: Self-pay

## 2020-06-01 ENCOUNTER — Ambulatory Visit: Payer: Medicare HMO | Admitting: Cardiology

## 2020-06-01 VITALS — BP 159/69 | HR 80 | Temp 96.9°F | Resp 16 | Ht 68.0 in | Wt 301.0 lb

## 2020-06-01 DIAGNOSIS — Z01810 Encounter for preprocedural cardiovascular examination: Secondary | ICD-10-CM

## 2020-06-01 DIAGNOSIS — Z955 Presence of coronary angioplasty implant and graft: Secondary | ICD-10-CM

## 2020-06-01 DIAGNOSIS — E1165 Type 2 diabetes mellitus with hyperglycemia: Secondary | ICD-10-CM

## 2020-06-01 DIAGNOSIS — Z87891 Personal history of nicotine dependence: Secondary | ICD-10-CM | POA: Diagnosis not present

## 2020-06-01 DIAGNOSIS — I2510 Atherosclerotic heart disease of native coronary artery without angina pectoris: Secondary | ICD-10-CM | POA: Diagnosis not present

## 2020-06-01 DIAGNOSIS — Z8249 Family history of ischemic heart disease and other diseases of the circulatory system: Secondary | ICD-10-CM | POA: Diagnosis not present

## 2020-06-01 DIAGNOSIS — E6609 Other obesity due to excess calories: Secondary | ICD-10-CM | POA: Diagnosis not present

## 2020-06-01 DIAGNOSIS — Z6834 Body mass index (BMI) 34.0-34.9, adult: Secondary | ICD-10-CM

## 2020-06-01 DIAGNOSIS — E782 Mixed hyperlipidemia: Secondary | ICD-10-CM

## 2020-06-01 DIAGNOSIS — I10 Essential (primary) hypertension: Secondary | ICD-10-CM

## 2020-06-01 MED ORDER — LOSARTAN POTASSIUM 25 MG PO TABS: 25.0000 mg | ORAL_TABLET | Freq: Every evening | ORAL | 0 refills | Status: DC

## 2020-06-01 NOTE — Progress Notes (Signed)
External Labs: Collected: 04/21/2020 Creatinine 1.16 mg/dL. Sodium 139 potassium 4, chloride 105, bicarb 24 eGFR: 60 mL/min per 1.73 m Lipid profile: Total cholesterol 100, triglycerides 224 HDL 44, LDL 28, non-HDL 56 A1c 8.3 TSH: 0.18, free T4 1.4

## 2020-06-01 NOTE — Progress Notes (Addendum)
Date:  06/01/2020   ID:  Koren Shiver, DOB Feb 24, 1960, MRN [id number]  PCP:  Benito Mccreedy, MD  Cardiologist:  Rex Kras, DO, [facility] (established care 06/01/2020) Former Cardiology Providers: Dr. Angelena Form, Dr. Acie Fredrickson, Dr. Einar Gip, Dr. [name], Dr. Layne Benton   REQUESTING PHYSICIAN:  Bonsu, Talmage Coin, [place] [address]  Chief Complaint  Patient presents with  . Pre-op Exam  . New Patient (Initial Visit)    HPI  [name] is a 60 y.o. female who presents to the office with a chief complaint of " preoperative risk stratification." Patient's past medical history and cardiovascular risk factors include: Hypertension, hyperlipidemia, insulin dependent diabetes mellitus type 2, family history of premature CAD (mom had PCI's at the age of 45), established CAD status post PCI to the RCA, former smoker, OSA on CPAP, obesity due to excess calories.  She is referred to the office at the request of Bonsu, [place], DO for evaluation of preoperative risk stratification.  Patient has seen multiple cardiovascular providers as outlined above and now presents to reestablish care with our practice as she is planning to undergo a lumbar fusion in the near future.  She was last seen by Dr. Maryan Rued at [facility] in October 2020 at that time she was recommended to discontinue Plavix as she had taken it more than 12 months in duration.  She has an upcoming appointment with her orthopedic on Jun 14, 2020 to determine when her surgery will be.  Patient denies any chest pain at rest or with effort related activities.  She has chronic shortness of breath with effort related activities which is stable.  No use of sublingual nitroglycerin tablets over the last 1 year.  No history of significant valvular heart disease.  Patient does have established CAD status post PCI to the RCA back in 2019, she is insulin-dependent diabetic, most recent hemoglobin A1c  greater than 8.   No regular exercise program.  She walks with a cane and her physical activity is limited due to back pain and hip pain.  Patient states that her home blood pressures are usually between 139-145 mmHg.  Medications reconciled.   FUNCTIONAL STATUS: No structured exercise program or daily routine.   ALLERGIES: Allergies  Allergen Reactions  . Contrast Media [Iodinated Diagnostic Agents] Swelling    Skin Peeling and break out  . Codeine Itching  . Other     NO PLASTIC TAPE    MEDICATION LIST PRIOR TO VISIT: Current Meds  Medication Sig  . aspirin EC 81 MG tablet Take 81 mg by mouth daily.  Marland Kitchen glipiZIDE (GLUCOTROL) 5 MG tablet Take 5 mg by mouth 2 (two) times daily before a meal.  . HYDROcodone-acetaminophen (NORCO/VICODIN) 5-325 MG tablet Take 1 tablet by mouth every 6 (six) hours as needed for moderate pain.  Marland Kitchen levothyroxine (SYNTHROID) 200 MCG tablet TK 1 T PO QD  . losartan (COZAAR) 25 MG tablet Take 1 tablet (25 mg total) by mouth every evening for 90 doses.  . metoprolol succinate (TOPROL-XL) 50 MG 24 hr tablet TK 1 T PO QD  . MITIGARE 0.6 MG CAPS Take 1 capsule by mouth daily.  . nitroGLYCERIN (NITROSTAT) 0.4 MG SL tablet Place 1 tablet (0.4 mg total) under the tongue every 5 (five) minutes as needed for chest pain (up to 3 doses).  . NOVOLOG MIX 70/30 FLEXPEN (70-30) 100 UNIT/ML FlexPen Inject 12-16 Units into the skin 3 (three)  times daily with meals. Sliding scale  . rosuvastatin (CRESTOR) 5 MG tablet Take 5 mg by mouth daily.  . traZODone (DESYREL) 50 MG tablet Take by mouth.  . triamterene-hydrochlorothiazide (MAXZIDE) 75-50 MG per tablet Take 0.5 tablets by mouth daily. (Patient taking differently: Take 1 tablet by mouth daily.)  . TRULICITY 1.5 0000000 SOPN      PAST MEDICAL HISTORY: Past Medical History:  Diagnosis Date  . ALLERGIC RHINITIS   . 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 Diabetes mellitus, type 2 ([place])   . History of tobacco abuse    Quit 2011  .  Hypertension   . Hypertriglyceridemia   . Hypothyroidism   . NSTEMI (non-ST elevated myocardial infarction) ([place]) 09/30/11   NO CAD by cath, ? coronary vasospasm  . Obesity   . Sleep apnea     PAST SURGICAL HISTORY: Past Surgical History:  Procedure Laterality Date  . CORONARY STENT INTERVENTION N/A 08/19/2017   Procedure: CORONARY STENT INTERVENTION;  Surgeon: [name], MD;  Location: [facility];  Service: Cardiovascular;  Laterality: N/A;  . LEFT HEART CATH AND CORONARY ANGIOGRAPHY N/A 08/19/2017   Procedure: LEFT HEART CATH AND CORONARY ANGIOGRAPHY;  Surgeon: [name], MD;  Location: [facility];  Service: Cardiovascular;  Laterality: N/A;  . LEFT HEART CATHETERIZATION WITH CORONARY ANGIOGRAM N/A 09/30/2011   Procedure: LEFT HEART CATHETERIZATION WITH CORONARY ANGIOGRAM;  Surgeon: Burnell Blanks, MD;  Location: [facility];  Service: Cardiovascular;  Laterality: N/A;  . TUBAL LIGATION    . VESICOVAGINAL FISTULA CLOSURE W/ TAH      FAMILY HISTORY: The patient family history includes Cancer in her maternal uncle; Diabetes in her brother, mother, and another family member; Heart disease in her mother; Hypertension in her brother and brother.  SOCIAL HISTORY:  The patient  reports that she quit smoking about 9 years ago. Her smoking use included cigarettes. She has a 28.80 pack-year smoking history. She has never used smokeless tobacco. She reports current alcohol use. She reports that she does not use drugs.  REVIEW OF SYSTEMS: Review of Systems  Constitutional: Negative for chills and fever.  HENT: Negative for hoarse voice and nosebleeds.   Eyes: Negative for discharge, double vision and pain.  Cardiovascular: Positive for dyspnea on exertion (chronic ). Negative for chest pain, claudication, leg swelling, near-syncope, orthopnea, palpitations, paroxysmal nocturnal dyspnea and syncope.  Respiratory: Negative for hemoptysis and shortness of breath.    Musculoskeletal: Negative for muscle cramps and myalgias.  Gastrointestinal: Negative for abdominal pain, constipation, diarrhea, hematemesis, hematochezia, melena, nausea and vomiting.  Neurological: Negative for dizziness and light-headedness.    PHYSICAL EXAM: Vitals with BMI 06/01/2020 05/12/2020 04/14/2020  Height 5\' 8"  5' 8.5" 5' 8.5"  Weight 301 lbs 296 lbs 6 oz 297 lbs  BMI 45.78 [id number]  Systolic [id number] [id number]  Diastolic 69 81 62  Pulse 80 83 83    CONSTITUTIONAL: Appears older than stated age, hemodynamically stable, ambulates with a cane, well-nourished. No acute distress.  SKIN: Skin is warm and dry. No rash noted. No cyanosis. No pallor. No jaundice HEAD: Normocephalic and atraumatic.  EYES: No scleral icterus MOUTH/THROAT: Moist oral membranes.  NECK: No JVD present. No thyromegaly noted. No carotid bruits  LYMPHATIC: No visible cervical adenopathy.  CHEST Normal respiratory effort. No intercostal retractions  LUNGS: Clear to auscultation bilaterally.  No stridor. No wheezes. No rales.  CARDIOVASCULAR: Regular, positive S1-S2, no murmurs rubs or gallops appreciated. ABDOMINAL: Obese, soft,  nontender, nondistended, positive bowel sounds all 4 quadrants no apparent ascites.  EXTREMITIES: No peripheral edema  HEMATOLOGIC: No significant bruising NEUROLOGIC: Oriented to person, place, and time. Nonfocal. Normal muscle tone.  PSYCHIATRIC: Normal mood and affect. Normal behavior. Cooperative  CARDIAC DATABASE: EKG: 06/01/2020: Normal sinus rhythm, 72 bpm, normal axis, without underlying ischemia or injury pattern.    Echocardiogram: 09/27/2011: Normal LV systolic function, poor echo window, mild diastolic dysfunction.  No significant valvular abnormality.   Stress Testing: 05/27/2015: Exercise nuclear stress test Exercised time 3 minutes and 32 seconds, achieved 90% of maximum predicted heart rate, 4.64 METS.  Perfusion imaging study revealed breast tissue attenuation  artifact without evidence of ischemia or scar.  There is normal wall motion and normal left ventricular systolic function, gated LVEF 62%, low risk study.  Heart Catheterization: Coronary angiogram 08/19/2017: Normal LV systolic function, EF [id number], normal LVEDP. Left main nonexistent, separate ostia for LAD and circumflex.  LAD has mild diffuse disease.  Circumflex is codominant with RCA and is more than normal. RCA is codominant with circumflex coronary artery.  Mid segment shows a ulcerated 80% stenosis S/P 3.0 x 20 mm Synergy, 80% to 0%, TIMI-3 to TIMI-3 flow maintained.  Recommend uninterrupted dual antiplatelet therapy with Aspirin 81mg  daily and Ticagrelor 90mg  twice daily for a minimum of 12 months (ACS - Class I recommendation).     LABORATORY DATA: CBC Latest Ref Rng & Units 03/16/2018 03/10/2018 08/20/2017  WBC 4.0 - 10.5 K/uL 10.4 12.4(H) 15.5(H)  Hemoglobin 12.0 - 15.0 g/dL 13.2 13.4 11.9(L)  Hematocrit 36.0 - 46.0 % 40.2 41.4 37.1  Platelets 150.0 - 400.0 K/uL 337.0 334 323    CMP Latest Ref Rng & Units 03/16/2018 03/10/2018 08/20/2017  Glucose 70 - 99 mg/dL 307(H) 286(H) 238(H)  BUN 6 - 23 mg/dL 21 23(H) 14  Creatinine 0.40 - 1.20 mg/dL 0.99 1.19(H) 0.98  Sodium 135 - 145 mEq/L 136 136 140  Potassium 3.5 - 5.1 mEq/L 3.9 3.6 3.5  Chloride 96 - 112 mEq/L 102 102 111  CO2 19 - 32 mEq/L 24 22 21(L)  Calcium 8.4 - 10.5 mg/dL 8.8 9.4 8.8(L)  Total Protein 6.0 - 8.3 g/dL - - -  Total Bilirubin 0.3 - 1.2 mg/dL - - -  Alkaline Phos 39 - 117 U/L - - -  AST 0 - 37 U/L - - -  ALT 0 - 35 U/L - - -    Lipid Panel     Component Value Date/Time   CHOL 159 08/18/2017 0901   TRIG 154 (H) 08/18/2017 0901   HDL 52 08/18/2017 0901   CHOLHDL 3.1 08/18/2017 0901   VLDL 31 08/18/2017 0901   LDLCALC 76 08/18/2017 0901    No components found for: NTPROBNP No results for input(s): PROBNP in the last 8760 hours. No results for input(s): TSH in the last 8760 hours.  BMP No results for  input(s): NA, K, CL, CO2, GLUCOSE, BUN, CREATININE, CALCIUM, GFRNONAA, GFRAA in the last 8760 hours.  HEMOGLOBIN A1C Lab Results  Component Value Date   HGBA1C 9.4 (H) 08/18/2017   MPG 223.08 08/18/2017    IMPRESSION:    ICD-10-CM   1. Preoperative cardiovascular examination  Z01.810 EKG 12-Lead    PCV ECHOCARDIOGRAM COMPLETE    PCV MYOCARDIAL PERFUSION WITH LEXISCAN  2. Atherosclerosis of native coronary artery of native heart without angina pectoris  I25.10 PCV ECHOCARDIOGRAM COMPLETE    PCV MYOCARDIAL PERFUSION WITH LEXISCAN  3. History of coronary angioplasty with  insertion of stent  Z95.5 PCV ECHOCARDIOGRAM COMPLETE    PCV MYOCARDIAL PERFUSION WITH LEXISCAN  4. Type 2 diabetes mellitus with hyperglycemia, without long-term current use of insulin (HCC)  E11.65   5. Mixed hyperlipidemia  E78.2   6. Benign hypertension  I10 losartan (COZAAR) 25 MG tablet    Basic metabolic panel    Magnesium  7. Family history of premature CAD  Z82.49   8. Former smoker  Z87.891   65. Class 1 obesity due to excess calories with serious comorbidity and body mass index (BMI) of 34.0 to 34.9 in adult  E66.09    Z68.34      RECOMMENDATIONS: [name] is a 60 y.o. female whose past medical history and cardiac risk factors include: Hypertension, hyperlipidemia, insulin dependent diabetes mellitus type 2, family history of premature CAD (mom had PCI's at the age of 81), established CAD status post PCI to the RCA, former smoker, OSA on CPAP, obesity due to excess calories.  Preoperative risk stratification:  No active chest pain or anginal equivalent.  Overall euvolemic and not in congestive heart failure.  EKG shows normal sinus rhythm without underlying ischemia or injury pattern.  Last seen in the office in 2019 and since then has been following up with [facility].  Patient is overall functional status is unknown which is limited due to back pain and hip  pain.  Echocardiogram will be ordered to evaluate for structural heart disease and left ventricular systolic function.  Nuclear stress test recommended to evaluate for reversible ischemia.  Insulin-dependent diabetes mellitus type 2, last A1c 8.3 due for additional labs next week  Established CAD status post PCI to the RCA in 2019.  No active chest pain or use of sublingual nitroglycerin tablets.  Continue beta-blocker therapy.  Recommend better blood pressure management.  In the setting of diabetes mellitus type 2 we will add losartan 25 mg p.o. daily.  Blood work in 1 week to evaluate kidney function and electrolytes  Risk stratification forthcoming  Atherosclerosis of the coronary arteries status post angioplasty stenting:  Reviewed the most recent left heart catheterization report findings noted above.  Reviewed last cardiology office note via care everywhere.  Continue aspirin.  Continue beta-blocker therapy.  Will start losartan.  Consider Jardiance in the setting of insulin-dependent diabetes mellitus type 2, will defer to primary team at this time.  Plan echo and stress test as discussed above.  Benign essential hypertension:  Patient states that her home blood pressures regularly 140 mmHg.  Due to the upcoming surgery would recommend blood pressures less than 140 mmHg.   We will start losartan 25 mg p.o. daily.  Blood work in 1 week  Low-salt diet recommended.  Increase physical activity as tolerated with a goal of 30 minutes a day 5 days a week.  Long-term follow-up will be deferred to primary team at this time.  Insulin-dependent diabetes mellitus type 2: Most recent hemoglobin A1c reviewed.  Patient states that she has upcoming labs with her PCP.  Educated on the importance of glycemic control.  Mixed hyperlipidemia: LDL within acceptable range, less than 70 mg/dL. Triglycerides are currently not at goal.  Patient's labs were received after the office  visit therefore will we discussed the next visit  Former smoker: Educated on the importance of continued smoking cessation.  Obesity, due to excess calories: . Body mass index is 45.77 kg/m. . I reviewed with the patient the importance of diet, regular physical activity/exercise, weight  loss.   . Patient is educated on increasing physical activity gradually as tolerated.  With the goal of moderate intensity exercise for 30 minutes a day 5 days a week.  FINAL MEDICATION LIST END OF ENCOUNTER: Meds ordered this encounter  Medications  . losartan (COZAAR) 25 MG tablet    Sig: Take 1 tablet (25 mg total) by mouth every evening for 90 doses.    Dispense:  90 tablet    Refill:  0    There are no discontinued medications.   Current Outpatient Medications:  .  aspirin EC 81 MG tablet, Take 81 mg by mouth daily., Disp: , Rfl:  .  glipiZIDE (GLUCOTROL) 5 MG tablet, Take 5 mg by mouth 2 (two) times daily before a meal., Disp: , Rfl:  .  HYDROcodone-acetaminophen (NORCO/VICODIN) 5-325 MG tablet, Take 1 tablet by mouth every 6 (six) hours as needed for moderate pain., Disp: 30 tablet, Rfl: 0 .  levothyroxine (SYNTHROID) 200 MCG tablet, TK 1 T PO QD, Disp: , Rfl:  .  losartan (COZAAR) 25 MG tablet, Take 1 tablet (25 mg total) by mouth every evening for 90 doses., Disp: 90 tablet, Rfl: 0 .  metoprolol succinate (TOPROL-XL) 50 MG 24 hr tablet, TK 1 T PO QD, Disp: , Rfl:  .  MITIGARE 0.6 MG CAPS, Take 1 capsule by mouth daily., Disp: , Rfl:  .  nitroGLYCERIN (NITROSTAT) 0.4 MG SL tablet, Place 1 tablet (0.4 mg total) under the tongue every 5 (five) minutes as needed for chest pain (up to 3 doses)., Disp: 25 tablet, Rfl: 3 .  NOVOLOG MIX 70/30 FLEXPEN (70-30) 100 UNIT/ML FlexPen, Inject 12-16 Units into the skin 3 (three) times daily with meals. Sliding scale, Disp: , Rfl: 2 .  rosuvastatin (CRESTOR) 5 MG tablet, Take 5 mg by mouth daily., Disp: , Rfl:  .  traZODone (DESYREL) 50 MG tablet, Take by  mouth., Disp: , Rfl:  .  triamterene-hydrochlorothiazide (MAXZIDE) 75-50 MG per tablet, Take 0.5 tablets by mouth daily. (Patient taking differently: Take 1 tablet by mouth daily.), Disp: 30 tablet, Rfl: 1 .  TRULICITY 1.5 JW/1.1BJ SOPN, , Disp: , Rfl:   Orders Placed This Encounter  Procedures  . Basic metabolic panel  . Magnesium  . PCV MYOCARDIAL PERFUSION WITH LEXISCAN  . EKG 12-Lead  . PCV ECHOCARDIOGRAM COMPLETE    There are no Patient Instructions on file for this visit.   --Continue cardiac medications as reconciled in final medication list. --Return in about 3 months (around 08/31/2020) for Follow up, CAD. Or sooner if needed. --Continue follow-up with your primary care physician regarding the management of your other chronic comorbid conditions.  Patient's questions and concerns were addressed to her satisfaction. She voices understanding of the instructions provided during this encounter.   This note was created using a voice recognition software as a result there may be grammatical errors inadvertently enclosed that do not reflect the nature of this encounter. Every attempt is made to correct such errors.  Rex Kras, [place], [facility]  Pager: [phone] Office: [phone]

## 2020-06-07 ENCOUNTER — Other Ambulatory Visit: Payer: Self-pay

## 2020-06-07 ENCOUNTER — Ambulatory Visit: Payer: Medicare HMO

## 2020-06-07 DIAGNOSIS — I2510 Atherosclerotic heart disease of native coronary artery without angina pectoris: Secondary | ICD-10-CM

## 2020-06-07 DIAGNOSIS — Z955 Presence of coronary angioplasty implant and graft: Secondary | ICD-10-CM | POA: Diagnosis not present

## 2020-06-07 DIAGNOSIS — Z01810 Encounter for preprocedural cardiovascular examination: Secondary | ICD-10-CM | POA: Diagnosis not present

## 2020-06-08 ENCOUNTER — Ambulatory Visit: Payer: Medicare HMO

## 2020-06-08 DIAGNOSIS — Z955 Presence of coronary angioplasty implant and graft: Secondary | ICD-10-CM

## 2020-06-08 DIAGNOSIS — Z01810 Encounter for preprocedural cardiovascular examination: Secondary | ICD-10-CM | POA: Diagnosis not present

## 2020-06-08 DIAGNOSIS — I2510 Atherosclerotic heart disease of native coronary artery without angina pectoris: Secondary | ICD-10-CM

## 2020-06-08 NOTE — Telephone Encounter (Signed)
From pt

## 2020-06-13 DIAGNOSIS — I2510 Atherosclerotic heart disease of native coronary artery without angina pectoris: Secondary | ICD-10-CM | POA: Diagnosis not present

## 2020-06-13 DIAGNOSIS — E1165 Type 2 diabetes mellitus with hyperglycemia: Secondary | ICD-10-CM | POA: Diagnosis not present

## 2020-06-13 DIAGNOSIS — Z0001 Encounter for general adult medical examination with abnormal findings: Secondary | ICD-10-CM | POA: Diagnosis not present

## 2020-06-13 DIAGNOSIS — Z794 Long term (current) use of insulin: Secondary | ICD-10-CM | POA: Diagnosis not present

## 2020-06-13 DIAGNOSIS — E039 Hypothyroidism, unspecified: Secondary | ICD-10-CM | POA: Diagnosis not present

## 2020-06-13 DIAGNOSIS — Z720 Tobacco use: Secondary | ICD-10-CM | POA: Diagnosis not present

## 2020-06-13 DIAGNOSIS — I10 Essential (primary) hypertension: Secondary | ICD-10-CM | POA: Diagnosis not present

## 2020-06-13 DIAGNOSIS — E782 Mixed hyperlipidemia: Secondary | ICD-10-CM | POA: Diagnosis not present

## 2020-06-14 ENCOUNTER — Ambulatory Visit (INDEPENDENT_AMBULATORY_CARE_PROVIDER_SITE_OTHER): Payer: Medicare HMO | Admitting: Specialist

## 2020-06-14 ENCOUNTER — Encounter: Payer: Self-pay | Admitting: Specialist

## 2020-06-14 VITALS — BP 118/70 | HR 60 | Ht 68.0 in | Wt 301.0 lb

## 2020-06-14 DIAGNOSIS — M48062 Spinal stenosis, lumbar region with neurogenic claudication: Secondary | ICD-10-CM

## 2020-06-14 DIAGNOSIS — M1611 Unilateral primary osteoarthritis, right hip: Secondary | ICD-10-CM | POA: Diagnosis not present

## 2020-06-14 DIAGNOSIS — M5136 Other intervertebral disc degeneration, lumbar region: Secondary | ICD-10-CM

## 2020-06-14 DIAGNOSIS — M4316 Spondylolisthesis, lumbar region: Secondary | ICD-10-CM | POA: Diagnosis not present

## 2020-06-14 DIAGNOSIS — M25559 Pain in unspecified hip: Secondary | ICD-10-CM | POA: Diagnosis not present

## 2020-06-14 NOTE — Addendum Note (Signed)
Addended by: Basil Dess on: 06/14/2020 10:48 AM   Modules accepted: Orders

## 2020-06-14 NOTE — Progress Notes (Signed)
Office Visit Note   Patient: [name]           Date of Birth: [birth year]           MRN: [id number] Visit Date: 06/14/2020              Requested by: Gevena Mart, DO [address] [place] [address] PCP: Benito Mccreedy, MD   Assessment & Plan: Visit Diagnoses:  1. Spondylolisthesis, lumbar region   2. Degenerative disc disease, lumbar   3. Spinal stenosis of lumbar region with neurogenic claudication   4. Unilateral primary osteoarthritis, right hip   5. Hip pain     Plan: Plan:Avoid bending, stooping and avoid lifting weights greater than 10 lbs. Avoid prolong standing and walking. Order for a new walker with wheels. Surgery scheduling secretary Kandice Hams, will call you in the next week to schedule for surgery.  Surgery recommended is a one level lumbar fusion L4-5 this would be done with rods, screws and cages with local bone graft and allograft (donor bone graft). Take hydrocodone for for pain. Risk of surgery includes risk of infection 1 in 200 patients, bleeding 1/4% chance you would need a transfusion. Risk to the nerves is one in 10,000. You will need to use a brace for 3 months and wean from the brace on the 4th month. Expect improved walking and standing tolerance. Expect relief of leg pain but numbness may persist depending on the length and degree of pressure that has been present.  Follow-Up Instructions: Return in about 4 weeks (around 07/12/2020).   Orders:  No orders of the defined types were placed in this encounter.  No orders of the defined types were placed in this encounter.     Procedures: No procedures performed   Clinical Data: No additional findings.   Subjective: Chief Complaint  Patient presents with  . Lower Back - Follow-up    States that she is hurting worse    60 year old female with history of back and leg pain worse with standing and walking. She has spondylolisthesis L4-5 with intermittant  claudication and is a candidate for  Lumbar fusion surgrery, a single level fusion L4-5. She has seen her cardiologist an he has noted that she has intermediate risk associated with surgery. Her stress test was Low risk. No bowel or bladder discomfort. Pain with walking greater than 5 minutes with right leg and back pain.    Review of Systems  Constitutional: Negative.   HENT: Positive for sinus pressure and sneezing.   Eyes: Positive for pain and itching.  Respiratory: Negative.   Cardiovascular: Negative.   Gastrointestinal: Negative.   Endocrine: Negative.   Genitourinary: Negative.   Musculoskeletal: Positive for back pain and gait problem.  Skin: Negative.   Allergic/Immunologic: Negative.   Hematological: Negative.   Psychiatric/Behavioral: Negative.      Objective: Vital Signs: BP 118/70 (BP Location: Left Arm, Patient Position: Sitting)   Pulse 60   Ht 5\' 8"  (1.727 m)   Wt (!) 301 lb (136.5 kg)   BMI 45.77 kg/m   Physical Exam Constitutional:      Appearance: She is well-developed.  HENT:     Head: Normocephalic and atraumatic.  Eyes:     Pupils: Pupils are equal, round, and reactive to light.  Pulmonary:     Effort: Pulmonary effort is normal.     Breath sounds: Normal breath sounds.  Abdominal:     General: Bowel  sounds are normal.     Palpations: Abdomen is soft.  Musculoskeletal:     Cervical back: Normal range of motion and neck supple.     Lumbar back: Negative right straight leg raise test and negative left straight leg raise test.  Skin:    General: Skin is warm and dry.  Neurological:     Mental Status: She is alert and oriented to person, place, and time.  Psychiatric:        Behavior: Behavior normal.        Thought Content: Thought content normal.        Judgment: Judgment normal.     Back Exam   Tenderness  The patient is experiencing tenderness in the lumbar.  Range of Motion  Extension: abnormal  Flexion: normal  Lateral bend  right: abnormal  Lateral bend left: abnormal  Rotation right: abnormal  Rotation left: abnormal   Tests  Straight leg raise right: negative Straight leg raise left: negative  Other  Toe walk: normal Heel walk: normal Gait: abductor lurch  Erythema: back redness Scars: absent  Comments:  Right hip ROM is decreased, decreased right hip IR and ER and hip flexion.      Specialty Comments:  No specialty comments available.  Imaging: No results found.   PMFS History: Patient Active Problem List   Diagnosis Date Noted  . Severe obstructive sleep apnea 11/09/2018  . Healthcare maintenance 11/09/2018  . At risk for obstructive sleep apnea 04/16/2018  . Acquired hypothyroidism 03/17/2018  . DOE (dyspnea on exertion) 03/16/2018  . NSTEMI (non-ST elevated myocardial infarction) ([facility]) 08/18/2017  . Right sided sciatica 07/10/2017  . Right low back pain 07/10/2017  . Chest pain 09/29/2011  . Morbid obesity ([place]) 09/29/2011  . DM type 2 (diabetes mellitus, type 2) ([facility]) 09/29/2011  . HTN (hypertension) 09/29/2011  . Pneumonia, bacterial 08/07/2010  . Incidental lung nodule, > 74mm and < 40mm 08/07/2010  . Bronchitis 08/07/2010   Past Medical History:  Diagnosis Date  . ALLERGIC RHINITIS   . 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 Diabetes mellitus, type 2 ([facility])   . History of tobacco abuse    Quit 2011  . Hypertension   . Hypertriglyceridemia   . Hypothyroidism   . NSTEMI (non-ST elevated myocardial infarction) ([facility]) 09/30/11   NO CAD by cath, ? coronary vasospasm  . Obesity   . Sleep apnea     Family History  Problem Relation Age of Onset  . Heart disease Mother        has pacemaker  . Diabetes Mother   . Diabetes Other        strong family history  . Cancer Maternal Uncle   . Hypertension Brother   . Diabetes Brother   . Hypertension Brother     Past Surgical History:  Procedure Laterality Date  . CORONARY STENT INTERVENTION N/A 08/19/2017   Procedure: CORONARY STENT  INTERVENTION;  Surgeon: [name], MD;  Location: [facility];  Service: Cardiovascular;  Laterality: N/A;  . LEFT HEART CATH AND CORONARY ANGIOGRAPHY N/A 08/19/2017   Procedure: LEFT HEART CATH AND CORONARY ANGIOGRAPHY;  Surgeon: [name], MD;  Location: [facility];  Service: Cardiovascular;  Laterality: N/A;  . LEFT HEART CATHETERIZATION WITH CORONARY ANGIOGRAM N/A 09/30/2011   Procedure: LEFT HEART CATHETERIZATION WITH CORONARY ANGIOGRAM;  Surgeon: Burnell Blanks, MD;  Location: [facility];  Service: Cardiovascular;  Laterality: N/A;  . TUBAL LIGATION    .  VESICOVAGINAL FISTULA CLOSURE W/ TAH     Social History   Occupational History  . Occupation: Retired  Tobacco Use  . Smoking status: Former Smoker    Packs/day: 0.80    Years: 36.00    Pack years: 28.80    Types: Cigarettes    Quit date: 02/05/2011    Years since quitting: 9.3  . Smokeless tobacco: Never Used  Vaping Use  . Vaping Use: Never used  Substance and Sexual Activity  . Alcohol use: Yes    Comment: VERY RARE  . Drug use: No  . Sexual activity: Not on file

## 2020-06-14 NOTE — Patient Instructions (Signed)
Plan:Avoid bending, stooping and avoid lifting weights greater than 10 lbs. Avoid prolong standing and walking. Order for a new walker with wheels. Surgery scheduling secretary Kandice Hams, will call you in the next week to schedule for surgery.  Surgery recommended is a one level lumbar fusion L4-5 this would be done with rods, screws and cages with local bone graft and allograft (donor bone graft). Take hydrocodone for for pain. Risk of surgery includes risk of infection 1 in 200 patients, bleeding 1/4% chance you would need a transfusion. Risk to the nerves is one in 10,000. You will need to use a brace for 3 months and wean from the brace on the 4th month. Expect improved walking and standing tolerance. Expect relief of leg pain but numbness may persist depending on the length and degree of pressure that has been present.

## 2020-06-23 ENCOUNTER — Telehealth: Payer: Self-pay

## 2020-06-23 NOTE — Telephone Encounter (Signed)
A1c was 8.5 on 06/13/20.  Per Dr. Louanne Skye, needs to be below 8.  I called patient and we discussed.  She stated her PCP has adjusted her medication and she will follow up on 07/17/20 for recheck.  I asked her to keep me informed.

## 2020-06-27 ENCOUNTER — Ambulatory Visit: Payer: Medicare HMO | Admitting: Orthopaedic Surgery

## 2020-07-18 DIAGNOSIS — E039 Hypothyroidism, unspecified: Secondary | ICD-10-CM | POA: Diagnosis not present

## 2020-07-18 DIAGNOSIS — Z720 Tobacco use: Secondary | ICD-10-CM | POA: Diagnosis not present

## 2020-07-18 DIAGNOSIS — Z0000 Encounter for general adult medical examination without abnormal findings: Secondary | ICD-10-CM | POA: Diagnosis not present

## 2020-07-18 DIAGNOSIS — I2510 Atherosclerotic heart disease of native coronary artery without angina pectoris: Secondary | ICD-10-CM | POA: Diagnosis not present

## 2020-07-18 DIAGNOSIS — E782 Mixed hyperlipidemia: Secondary | ICD-10-CM | POA: Diagnosis not present

## 2020-07-18 DIAGNOSIS — Z794 Long term (current) use of insulin: Secondary | ICD-10-CM | POA: Diagnosis not present

## 2020-07-18 DIAGNOSIS — I10 Essential (primary) hypertension: Secondary | ICD-10-CM | POA: Diagnosis not present

## 2020-07-18 DIAGNOSIS — E1165 Type 2 diabetes mellitus with hyperglycemia: Secondary | ICD-10-CM | POA: Diagnosis not present

## 2020-07-20 ENCOUNTER — Ambulatory Visit (INDEPENDENT_AMBULATORY_CARE_PROVIDER_SITE_OTHER): Payer: Medicare HMO | Admitting: Specialist

## 2020-07-20 ENCOUNTER — Encounter: Payer: Self-pay | Admitting: Specialist

## 2020-07-20 ENCOUNTER — Other Ambulatory Visit: Payer: Self-pay

## 2020-07-20 VITALS — BP 118/75 | HR 57 | Ht 68.0 in | Wt 301.0 lb

## 2020-07-20 DIAGNOSIS — M1611 Unilateral primary osteoarthritis, right hip: Secondary | ICD-10-CM

## 2020-07-20 DIAGNOSIS — M5136 Other intervertebral disc degeneration, lumbar region: Secondary | ICD-10-CM | POA: Diagnosis not present

## 2020-07-20 DIAGNOSIS — M48062 Spinal stenosis, lumbar region with neurogenic claudication: Secondary | ICD-10-CM

## 2020-07-20 DIAGNOSIS — M4316 Spondylolisthesis, lumbar region: Secondary | ICD-10-CM

## 2020-07-20 MED ORDER — HYDROCODONE-ACETAMINOPHEN 5-325 MG PO TABS: 1.0000 | ORAL_TABLET | Freq: Four times a day (QID) | ORAL | 0 refills | Status: DC | PRN

## 2020-07-20 NOTE — Addendum Note (Signed)
Addended by: Basil Dess on: 07/20/2020 09:33 AM   Modules accepted: Orders

## 2020-07-20 NOTE — Patient Instructions (Signed)
Plan:  When HgbA1 c is less than  Avoid bending, stooping and avoid lifting weights greater than 10 lbs. Avoid prolong standing and walking. Order for a new walker with wheels. Surgery scheduling secretary Kandice Hams, will call you in the next week to schedule for surgery. Surgery recommended is a one level lumbar fusion L4-5 this would be done with rods, screws and cages with local bone graft and allograft (donor bone graft). Take hydrocodone for for pain. Risk of surgery includes risk of infection 1 in 200 patients, bleeding 1/4% chance you would need a transfusion.   Risk to the nerves is one in 10,000. You will need to use a brace for 3 months and wean from the brace on the 4th month. Expect improved walking and standing tolerance. Expect relief of leg pain but numbness may persist depending on the length and degree of pressure that has been present.   Follow-Up Instructions: Return in about 4 weeks (around 07/12/2020).

## 2020-07-20 NOTE — Progress Notes (Signed)
Office Visit Note   Patient: [name]           Date of Birth: 03-12-1960           MRN: [id number] Visit Date: 07/20/2020              Requested by: Benito Mccreedy, MD [address] [place],  [place] [address] PCP: Benito Mccreedy, MD   Assessment & Plan: Visit Diagnoses:  1. Spondylolisthesis, lumbar region   2. Degenerative disc disease, lumbar   3. Spinal stenosis of lumbar region with neurogenic claudication   4. Severe obesity (BMI >= 40) (H[facility])   5. Unilateral primary osteoarthritis, right hip     Plan:  When HgbA1 c is less than 8.0 then surgery will be scheduled.  Avoid bending, stooping and avoid lifting weights greater than 10 lbs. Avoid prolong standing and walking. Order for a new walker with wheels. Surgery scheduling secretary Kandice Hams, will call you in the next week to schedule for surgery. Surgery recommended is a one level lumbar fusion L4-5 this would be done with rods, screws and cages with local bone graft and allograft (donor bone graft). Take hydrocodone for for pain. Risk of surgery includes risk of infection 1 in 200 patients, bleeding 1/4% chance you would need a transfusion.   Risk to the nerves is one in 10,000. You will need to use a brace for 3 months and wean from the brace on the 4th month. Expect improved walking and standing tolerance. Expect relief of leg pain but numbness may persist depending on the length and degree of pressure that has been present.   Follow-Up Instructions: Return in about 4 weeks (around 07/12/2020).  Follow-Up Instructions: No follow-ups on file.   Orders:  No orders of the defined types were placed in this encounter.  No orders of the defined types were placed in this encounter.     Procedures: No procedures performed   Clinical Data: No additional findings.   Subjective: Chief Complaint  Patient presents with   Lower Back - Follow-up    Being scheduled for intervention  for her spine but HgBA1c has been a concern. She is working with an endocrinologist and taking lantus now. BS are better in the 150s. Primary care does not want to recheck HgBA1c till  August. No bowel or bladder difficulty. Taking hydrocodone last prescription nearly 5 months ago.     Review of Systems  Constitutional: Negative.   HENT: Negative.    Eyes: Negative.   Respiratory: Negative.    Cardiovascular: Negative.   Gastrointestinal: Negative.   Endocrine: Negative.   Genitourinary: Negative.   Musculoskeletal: Negative.   Skin: Negative.   Allergic/Immunologic: Negative.   Neurological: Negative.   Hematological: Negative.   Psychiatric/Behavioral: Negative.      Objective: Vital Signs: BP 118/75 (BP Location: Left Arm, Patient Position: Sitting)   Pulse (!) 57   Ht 5\' 8"  (1.727 m)   Wt (!) 301 lb (136.5 kg)   BMI 45.77 kg/m   Physical Exam Constitutional:      Appearance: She is well-developed.  HENT:     Head: Normocephalic and atraumatic.  Eyes:     Pupils: Pupils are equal, round, and reactive to light.  Pulmonary:     Effort: Pulmonary effort is normal.     Breath sounds: Normal breath sounds.  Abdominal:     General: Bowel sounds are normal.     Palpations: Abdomen is soft.  Musculoskeletal:  Cervical back: Normal range of motion and neck supple.     Lumbar back: Negative right straight leg raise test and negative left straight leg raise test.  Skin:    General: Skin is warm and dry.  Neurological:     Mental Status: She is alert and oriented to person, place, and time.  Psychiatric:        Behavior: Behavior normal.        Thought Content: Thought content normal.        Judgment: Judgment normal.    Back Exam   Tenderness  The patient is experiencing tenderness in the lumbar.  Range of Motion  Extension:  abnormal  Flexion:  abnormal  Lateral bend right:  abnormal  Lateral bend left:  abnormal  Rotation right:  abnormal  Rotation  left:  abnormal   Muscle Strength  Right Quadriceps:  4/5  Left Quadriceps:  5/5  Right Hamstrings:  5/5  Left Hamstrings:  5/5   Tests  Straight leg raise right: negative Straight leg raise left: negative  Other  Toe walk: abnormal Heel walk: abnormal Sensation: decreased Erythema: no back redness Scars: absent  Comments:  Right knee 4/5 extension strength Right foot DF strength is 4/5     Specialty Comments:  No specialty comments available.  Imaging: No results found.   PMFS History: Patient Active Problem List   Diagnosis Date Noted   Severe obstructive sleep apnea 11/09/2018   Healthcare maintenance 11/09/2018   At risk for obstructive sleep apnea 04/16/2018   Acquired hypothyroidism 03/17/2018   DOE (dyspnea on exertion) 03/16/2018   NSTEMI (non-ST elevated myocardial infarction) ([place]) 08/18/2017   Right sided sciatica 07/10/2017   Right low back pain 07/10/2017   Chest pain 09/29/2011   Morbid obesity ([place]) 09/29/2011   DM type 2 (diabetes mellitus, type 2) ([place]) 09/29/2011   HTN (hypertension) 09/29/2011   Pneumonia, bacterial 08/07/2010   Incidental lung nodule, > 41mm and < 60mm 08/07/2010   Bronchitis 08/07/2010   Past Medical History:  Diagnosis Date   ALLERGIC RHINITIS    Allergy to IVP dye    Diabetes mellitus, type 2 ([place] [place])    History of tobacco abuse    Quit 2011   Hypertension    Hypertriglyceridemia    Hypothyroidism    NSTEMI (non-ST elevated myocardial infarction) ([place]) 09/30/11   NO CAD by cath, ? coronary vasospasm   Obesity    Sleep apnea     Family History  Problem Relation Age of Onset   Heart disease Mother        has pacemaker   Diabetes Mother    Diabetes Other        strong family history   Cancer Maternal Uncle    Hypertension Brother    Diabetes Brother    Hypertension Brother     Past Surgical History:  Procedure Laterality Date   CORONARY STENT INTERVENTION N/A 08/19/2017   Procedure: CORONARY STENT  INTERVENTION;  Surgeon: [name], MD;  Location: [facility];  Service: Cardiovascular;  Laterality: N/A;   LEFT HEART CATH AND CORONARY ANGIOGRAPHY N/A 08/19/2017   Procedure: LEFT HEART CATH AND CORONARY ANGIOGRAPHY;  Surgeon: [name], MD;  Location: [facility];  Service: Cardiovascular;  Laterality: N/A;   LEFT HEART CATHETERIZATION WITH CORONARY ANGIOGRAM N/A 09/30/2011   Procedure: LEFT HEART CATHETERIZATION WITH CORONARY ANGIOGRAM;  Surgeon: Burnell Blanks, MD;  Location: [facility];  Service: Cardiovascular;  Laterality: N/A;  TUBAL LIGATION     VESICOVAGINAL FISTULA CLOSURE W/ TAH     Social History   Occupational History   Occupation: Retired  Tobacco Use   Smoking status: Former    Packs/day: 0.80    Years: 36.00    Pack years: 28.80    Types: Cigarettes    Quit date: 02/05/2011    Years since quitting: 9.4   Smokeless tobacco: Never  Vaping Use   Vaping Use: Never used  Substance and Sexual Activity   Alcohol use: Yes    Comment: VERY RARE   Drug use: No   Sexual activity: Not on file

## 2020-08-26 ENCOUNTER — Other Ambulatory Visit: Payer: Self-pay | Admitting: Cardiology

## 2020-08-26 DIAGNOSIS — I10 Essential (primary) hypertension: Secondary | ICD-10-CM

## 2020-08-31 ENCOUNTER — Other Ambulatory Visit: Payer: Self-pay

## 2020-08-31 ENCOUNTER — Encounter: Payer: Self-pay | Admitting: Cardiology

## 2020-08-31 ENCOUNTER — Ambulatory Visit: Payer: Medicare HMO | Admitting: Cardiology

## 2020-08-31 VITALS — BP 113/61 | HR 68 | Temp 96.8°F | Resp 16 | Ht 68.0 in | Wt 292.0 lb

## 2020-08-31 DIAGNOSIS — Z955 Presence of coronary angioplasty implant and graft: Secondary | ICD-10-CM

## 2020-08-31 DIAGNOSIS — I2510 Atherosclerotic heart disease of native coronary artery without angina pectoris: Secondary | ICD-10-CM

## 2020-08-31 DIAGNOSIS — E1165 Type 2 diabetes mellitus with hyperglycemia: Secondary | ICD-10-CM

## 2020-08-31 DIAGNOSIS — Z87891 Personal history of nicotine dependence: Secondary | ICD-10-CM

## 2020-08-31 DIAGNOSIS — E782 Mixed hyperlipidemia: Secondary | ICD-10-CM | POA: Diagnosis not present

## 2020-08-31 DIAGNOSIS — Z6841 Body Mass Index (BMI) 40.0 and over, adult: Secondary | ICD-10-CM | POA: Diagnosis not present

## 2020-08-31 DIAGNOSIS — Z01810 Encounter for preprocedural cardiovascular examination: Secondary | ICD-10-CM

## 2020-08-31 DIAGNOSIS — Z8249 Family history of ischemic heart disease and other diseases of the circulatory system: Secondary | ICD-10-CM

## 2020-08-31 DIAGNOSIS — I10 Essential (primary) hypertension: Secondary | ICD-10-CM

## 2020-08-31 DIAGNOSIS — E6609 Other obesity due to excess calories: Secondary | ICD-10-CM

## 2020-08-31 NOTE — Progress Notes (Signed)
Date:  08/31/2020   ID:  Koren Shiver, DOB 1960-06-28, MRN [id number]  PCP:  Benito Mccreedy, MD  Cardiologist:  Rex Kras, DO, [facility] (established care 06/01/2020) Former Cardiology Providers: Dr. Angelena Form, Dr. Acie Fredrickson, Dr. Einar Gip, Dr. [name], Dr. Layne Benton  Date: 08/31/20 Last Office Visit: 06/01/2020  Chief Complaint  Patient presents with   Coronary Artery Disease   Follow-up    HPI  [name] is a 60 y.o. female who presents to the office with a chief complaint of " postop follow-up and CAD management." Patient's past medical history and cardiovascular risk factors include: Hypertension, hyperlipidemia, insulin dependent diabetes mellitus type 2, family history of premature CAD (mom had PCI's at the age of 40), established CAD status post PCI to the RCA, former smoker, OSA on CPAP, obesity due to excess calories.  She is referred to the office at the request of Osei-Bonsu, Iona Beard, MD for evaluation of preoperative risk stratification.  Patient was formally under the care of Dr. Maryan Rued at [facility].   She presents for 25-monthfollow-up for management of CAD and post noncardiac surgery.   Since last office visit patient is doing well from a cardiovascular standpoint.  She denies any chest pain or shortness of breath at rest or with effort related activities.  She was supposed to have back surgery in the interim; however, due to her hemoglobin A1c being greater than 8 the surgeon has postponed the surgery until her hemoglobin a1c are within acceptable range to promote appropriate healing postsurgery.  At the last office visit we started losartan given her diabetes and hypertension she tolerated the medication very well and her home systolic blood pressures are consistently less than 120 mmHg.   FUNCTIONAL STATUS: No structured exercise program or daily routine.   ALLERGIES: Allergies  Allergen Reactions   Contrast Media [[employer]] Swelling    Skin Peeling and break out   Codeine Itching   Other     NO PLASTIC TAPE    MEDICATION LIST PRIOR TO VISIT: Current Meds  Medication Sig   aspirin EC 81 MG tablet Take 81 mg by mouth daily.   HYDROcodone-acetaminophen (NORCO/VICODIN) 5-325 MG tablet Take 1 tablet by mouth every 6 (six) hours as needed for moderate pain.   levothyroxine (SYNTHROID) 100 MCG tablet Take 100 mcg by mouth daily at 12 noon.   losartan (COZAAR) 25 MG tablet TAKE 1 TABLET(25 MG) BY MOUTH EVERY EVENING FOR 90 DOSES   metoprolol succinate (TOPROL-XL) 50 MG 24 hr tablet Take 50 mg by mouth daily.   MITIGARE 0.6 MG CAPS Take 1 capsule by mouth daily.   nitroGLYCERIN (NITROSTAT) 0.4 MG SL tablet Place 1 tablet (0.4 mg total) under the tongue every 5 (five) minutes as needed for chest pain (up to 3 doses).   NOVOLOG MIX 70/30 FLEXPEN (70-30) 100 UNIT/ML FlexPen Inject 12-16 Units into the skin 3 (three) times daily with meals. Sliding scale   rosuvastatin (CRESTOR) 5 MG tablet Take 5 mg by mouth daily.   traZODone (DESYREL) 50 MG tablet Take 50 mg by mouth daily as needed.   triamterene-hydrochlorothiazide (MAXZIDE) 75-50 MG tablet Take 1 tablet by mouth daily.   TRULICITY 1.5 M0000000SOPN once a week.     PAST MEDICAL HISTORY: Past Medical History:  Diagnosis Date   ALLERGIC RHINITIS    Allergy to IVP dye    [place]    History of tobacco abuse  Quit 2011   Hypertension    Hypertriglyceridemia    Hypothyroidism    NSTEMI (non-ST elevated myocardial infarction) ([place]) 09/30/11   NO CAD by cath, ? coronary vasospasm   Obesity    Sleep apnea     PAST SURGICAL HISTORY: Past Surgical History:  Procedure Laterality Date   CORONARY STENT INTERVENTION N/A 08/19/2017   Procedure: CORONARY STENT INTERVENTION;  Surgeon: [name], MD;  Location: [place] CV LAB;  Service: Cardiovascular;  Laterality: N/A;   LEFT HEART CATH AND CORONARY ANGIOGRAPHY N/A 08/19/2017    Procedure: LEFT HEART CATH AND CORONARY ANGIOGRAPHY;  Surgeon: [name], MD;  Location: [place] CV LAB;  Service: Cardiovascular;  Laterality: N/A;   LEFT HEART CATHETERIZATION WITH CORONARY ANGIOGRAM N/A 09/30/2011   Procedure: LEFT HEART CATHETERIZATION WITH CORONARY ANGIOGRAM;  Surgeon: Burnell Blanks, MD;  Location: [facility] C[facility] LAB;  Service: Cardiovascular;  Laterality: N/A;   TUBAL LIGATION     VESICOVAGINAL FISTULA CLOSURE W/ TAH      FAMILY HISTORY: The patient family history includes Cancer in her maternal uncle; Diabetes in her brother, mother, and another family member; Heart disease in her mother; Hypertension in her brother and brother.  SOCIAL HISTORY:  The patient  reports that she quit smoking about 9 years ago. Her smoking use included cigarettes. She has a 28.80 pack-year smoking history. She has never used smokeless tobacco. She reports current alcohol use. She reports that she does not use drugs.  REVIEW OF SYSTEMS: Review of Systems  Constitutional: Negative for chills and fever.  HENT:  Negative for hoarse voice and nosebleeds.   Eyes:  Negative for discharge, double vision and pain.  Cardiovascular:  Positive for dyspnea on exertion (chronic ). Negative for chest pain, claudication, leg swelling, near-syncope, orthopnea, palpitations, paroxysmal nocturnal dyspnea and syncope.  Respiratory:  Negative for hemoptysis and shortness of breath.   Musculoskeletal:  Negative for muscle cramps and myalgias.  Gastrointestinal:  Negative for abdominal pain, constipation, diarrhea, hematemesis, hematochezia, melena, nausea and vomiting.  Neurological:  Negative for dizziness and light-headedness.   PHYSICAL EXAM: Vitals with BMI 08/31/2020 07/20/2020 06/14/2020  Height '5\' 8"'$  '5\' 8"'$  '5\' 8"'$   Weight 292 lbs 301 lbs 301 lbs  BMI 44.41 [id number]  Systolic 123456 123456 123456  Diastolic 61 75 70  Pulse 68 57 60    CONSTITUTIONAL: Appears older than stated age, hemodynamically  stable, ambulates with a cane, well-nourished. No acute distress.  SKIN: Skin is warm and dry. No rash noted. No cyanosis. No pallor. No jaundice HEAD: Normocephalic and atraumatic.  EYES: No scleral icterus MOUTH/THROAT: Moist oral membranes.  NECK: No JVD present. No thyromegaly noted. No carotid bruits  LYMPHATIC: No visible cervical adenopathy.  CHEST Normal respiratory effort. No intercostal retractions  LUNGS: Clear to auscultation bilaterally.  No stridor. No wheezes. No rales.  CARDIOVASCULAR: Regular, positive S1-S2, no murmurs rubs or gallops appreciated. ABDOMINAL: Obese, soft, nontender, nondistended, positive bowel sounds all 4 quadrants no apparent ascites.  EXTREMITIES: No peripheral edema  HEMATOLOGIC: No significant bruising NEUROLOGIC: Oriented to person, place, and time. Nonfocal. Normal muscle tone.  PSYCHIATRIC: Normal mood and affect. Normal behavior. Cooperative  CARDIAC DATABASE: EKG: 06/01/2020: Normal sinus rhythm, 72 bpm, normal axis, without underlying ischemia or injury pattern.    Echocardiogram: 06/08/2020: Technically difficult study. Left ventricle cavity is normal in size and wall thickness. Normal global wall motion. Normal LV systolic function with visual EF 50-55%. Doppler evidence of grade I (impaired) diastolic dysfunction,  normal LAP. No significant valvular abnormality. Normal right atrial pressure. No significant change compared to previous study in 2019.   Stress Testing: Lexiscan Tetrofosmin stress test 06/07/2020: 1 Day Rest/Stress Protocol. Stress EKG is non-diagnostic, as this is pharmacological stress test using Lexiscan. No convincing evidence of reversible myocardial ischemia or prior infarct. Left ventricular wall thickness is preserved without regional wall motion abnormalities. Calculated LVEF  51%, visually appears preserved. No prior studies available for comparison.  Heart Catheterization: Coronary angiogram  08/19/2017: Normal LV systolic function, EF [id number], normal LVEDP. Left main nonexistent, separate ostia for LAD and circumflex.  LAD has mild diffuse disease.  Circumflex is codominant with RCA and is more than normal. RCA is codominant with circumflex coronary artery.  Mid segment shows a ulcerated 80% stenosis S/P 3.0 x 20 mm Synergy, 80% to 0%, TIMI-3 to TIMI-3 flow maintained.   Recommend uninterrupted dual antiplatelet therapy with Aspirin '81mg'$  daily and Ticagrelor '90mg'$  twice daily for a minimum of 12 months (ACS - Class I recommendation).     LABORATORY DATA: CBC Latest Ref Rng & Units 03/16/2018 03/10/2018 08/20/2017  WBC 4.0 - 10.5 K/uL 10.4 12.4(H) 15.5(H)  Hemoglobin 12.0 - 15.0 g/dL 13.2 13.4 11.9(L)  Hematocrit 36.0 - 46.0 % 40.2 41.4 37.1  Platelets 150.0 - 400.0 K/uL 337.0 334 323    CMP Latest Ref Rng & Units 03/16/2018 03/10/2018 08/20/2017  Glucose 70 - 99 mg/dL 307(H) 286(H) 238(H)  BUN 6 - 23 mg/dL 21 23(H) 14  Creatinine 0.40 - 1.20 mg/dL 0.99 1.19(H) 0.98  Sodium 135 - 145 mEq/L 136 136 140  Potassium 3.5 - 5.1 mEq/L 3.9 3.6 3.5  Chloride 96 - 112 mEq/L 102 102 111  CO2 19 - 32 mEq/L 24 22 21(L)  Calcium 8.4 - 10.5 mg/dL 8.8 9.4 8.8(L)  Total Protein 6.0 - 8.3 g/dL - - -  Total Bilirubin 0.3 - 1.2 mg/dL - - -  Alkaline Phos 39 - 117 U/L - - -  AST 0 - 37 U/L - - -  ALT 0 - 35 U/L - - -    Lipid Panel     Component Value Date/Time   CHOL 159 08/18/2017 0901   TRIG 154 (H) 08/18/2017 0901   HDL 52 08/18/2017 0901   CHOLHDL 3.1 08/18/2017 0901   VLDL 31 08/18/2017 0901   LDLCALC 76 08/18/2017 0901    No components found for: NTPROBNP No results for input(s): PROBNP in the last 8760 hours. No results for input(s): TSH in the last 8760 hours.  BMP No results for input(s): NA, K, CL, CO2, GLUCOSE, BUN, CREATININE, CALCIUM, GFRNONAA, GFRAA in the last 8760 hours.  HEMOGLOBIN A1C Lab Results  Component Value Date   HGBA1C 9.4 (H) 08/18/2017   MPG 223.08  08/18/2017    IMPRESSION:    ICD-10-CM   1. Atherosclerosis of native coronary artery of native heart without angina pectoris  I25.10     2. History of coronary angioplasty with insertion of stent  Z95.5     3. Preoperative cardiovascular examination  Z01.810     4. Type 2 diabetes mellitus with hyperglycemia, without long-term current use of insulin (HCC)  E11.65     5. Mixed hyperlipidemia  E78.2     6. Benign hypertension  I10     7. Family history of premature CAD  Z82.49     8. Former smoker  Z87.891     57. Class 3 severe obesity due to excess calories with serious  comorbidity and body mass index (BMI) of 40.0 to 44.9 in adult [facility])  E66.01    Z68.41        RECOMMENDATIONS: [name] is a 60 y.o. female whose past medical history and cardiac risk factors include: Hypertension, hyperlipidemia, insulin dependent diabetes mellitus type 2, family history of premature CAD (mom had PCI's at the age of 49), established CAD status post PCI to the RCA, former smoker, OSA on CPAP, obesity due to excess calories.  Preoperative risk stratification: No active chest pain or anginal equivalent.   Overall euvolemic and not in congestive heart failure. EKG shows normal sinus rhythm without underlying ischemia or injury pattern. Reviewed the results of the echocardiogram and nuclear stress test with the patient.  No additional work-up is warranted. Patient is considered to be an acceptable candidate for the upcoming noncardiac surgery and is at low to moderate risk for cardiovascular complications given her chronic comorbid conditions.  But this is not modifying and she can proceed with surgery once cleared by her surgeon.   Continue beta-blocker therapy.  Atherosclerosis of the coronary arteries status post angioplasty stenting: Chest pain-free. No use of sublingual nitroglycerin tablets since last office encounter. Most recent ischemic evaluation since last office visit reviewed  with the patient. Has tolerated losartan well without any side effects or intolerances. Recommended initiation of Jardiance given her history of diabetes.  I have asked her to discuss this further with PCP.  Benign essential hypertension: Office blood pressures within excellent control. Home blood pressures are also well controlled, less than 120 mmHg.   Low-salt diet recommended. Increase physical activity as tolerated with a goal of 30 minutes a day 5 days a week. Currently managed by primary care provider.  Insulin-dependent diabetes mellitus type 2:  Most recent hemoglobin A1c reviewed.   Educated on the importance of glycemic control.   Reassured the patient that hemoglobin of less than or equal to 8 will help promote wound healing and I agree with the surgeon's recommendations.  Mixed hyperlipidemia: LDL within acceptable range, less than 70 mg/dL. Recommend checking a fasting lipid profile prior to the next office visit to further evaluate her lipids/triglycerides.  This can be done with either her PCP or myself prior to the next visit.  Former smoker: Educated on the importance of continued smoking cessation.  Obesity, due to excess calories: Body mass index is 44.4 kg/m. I reviewed with the patient the importance of diet, regular physical activity/exercise, weight loss.   Patient is educated on increasing physical activity gradually as tolerated.  With the goal of moderate intensity exercise for 30 minutes a day 5 days a week.  FINAL MEDICATION LIST END OF ENCOUNTER: No orders of the defined types were placed in this encounter.   There are no discontinued medications.   Current Outpatient Medications:    aspirin EC 81 MG tablet, Take 81 mg by mouth daily., Disp: , Rfl:    HYDROcodone-acetaminophen (NORCO/VICODIN) 5-325 MG tablet, Take 1 tablet by mouth every 6 (six) hours as needed for moderate pain., Disp: 30 tablet, Rfl: 0   levothyroxine (SYNTHROID) 100 MCG tablet,  Take 100 mcg by mouth daily at 12 noon., Disp: , Rfl:    losartan (COZAAR) 25 MG tablet, TAKE 1 TABLET(25 MG) BY MOUTH EVERY EVENING FOR 90 DOSES, Disp: 90 tablet, Rfl: 0   metoprolol succinate (TOPROL-XL) 50 MG 24 hr tablet, Take 50 mg by mouth daily., Disp: , Rfl:    MITIGARE 0.6 MG CAPS, Take 1  capsule by mouth daily., Disp: , Rfl:    nitroGLYCERIN (NITROSTAT) 0.4 MG SL tablet, Place 1 tablet (0.4 mg total) under the tongue every 5 (five) minutes as needed for chest pain (up to 3 doses)., Disp: 25 tablet, Rfl: 3   NOVOLOG MIX 70/30 FLEXPEN (70-30) 100 UNIT/ML FlexPen, Inject 12-16 Units into the skin 3 (three) times daily with meals. Sliding scale, Disp: , Rfl: 2   rosuvastatin (CRESTOR) 5 MG tablet, Take 5 mg by mouth daily., Disp: , Rfl:    traZODone (DESYREL) 50 MG tablet, Take 50 mg by mouth daily as needed., Disp: , Rfl:    triamterene-hydrochlorothiazide (MAXZIDE) 75-50 MG tablet, Take 1 tablet by mouth daily., Disp: , Rfl:    TRULICITY 1.5 0000000 SOPN, once a week., Disp: , Rfl:    LANTUS SOLOSTAR 100 UNIT/ML Solostar Pen, Inject 15 Units into the skin at bedtime., Disp: , Rfl:   No orders of the defined types were placed in this encounter.   There are no Patient Instructions on file for this visit.   --Continue cardiac medications as reconciled in final medication list. --Return in about 3 months (around 12/01/2020) for Follow up, CAD, postop. Or sooner if needed. --Continue follow-up with your primary care physician regarding the management of your other chronic comorbid conditions.  Patient's questions and concerns were addressed to her satisfaction. She voices understanding of the instructions provided during this encounter.   This note was created using a voice recognition software as a result there may be grammatical errors inadvertently enclosed that do not reflect the nature of this encounter. Every attempt is made to correct such errors.  Rex Kras, [place], [facility]  Pager:  [phone] Office: [phone]

## 2020-09-05 ENCOUNTER — Other Ambulatory Visit: Payer: Self-pay

## 2020-09-05 DIAGNOSIS — I10 Essential (primary) hypertension: Secondary | ICD-10-CM

## 2020-09-06 DIAGNOSIS — M109 Gout, unspecified: Secondary | ICD-10-CM | POA: Diagnosis not present

## 2020-09-06 DIAGNOSIS — I129 Hypertensive chronic kidney disease with stage 1 through stage 4 chronic kidney disease, or unspecified chronic kidney disease: Secondary | ICD-10-CM | POA: Diagnosis not present

## 2020-09-06 DIAGNOSIS — N182 Chronic kidney disease, stage 2 (mild): Secondary | ICD-10-CM | POA: Diagnosis not present

## 2020-09-06 DIAGNOSIS — N1832 Chronic kidney disease, stage 3b: Secondary | ICD-10-CM | POA: Diagnosis not present

## 2020-09-06 DIAGNOSIS — E1122 Type 2 diabetes mellitus with diabetic chronic kidney disease: Secondary | ICD-10-CM | POA: Diagnosis not present

## 2020-09-08 ENCOUNTER — Other Ambulatory Visit: Payer: Self-pay | Admitting: Nephrology

## 2020-09-08 DIAGNOSIS — N1832 Chronic kidney disease, stage 3b: Secondary | ICD-10-CM

## 2020-09-14 ENCOUNTER — Other Ambulatory Visit: Payer: Self-pay

## 2020-09-14 ENCOUNTER — Ambulatory Visit
Admission: RE | Admit: 2020-09-14 | Discharge: 2020-09-14 | Disposition: A | Discharge status: Home / Self Care | Payer: Medicaid Other | Source: Ambulatory Visit | Attending: Nephrology | Admitting: Nephrology

## 2020-09-14 DIAGNOSIS — N1832 Chronic kidney disease, stage 3b: Secondary | ICD-10-CM | POA: Diagnosis not present

## 2020-09-18 DIAGNOSIS — N179 Acute kidney failure, unspecified: Secondary | ICD-10-CM | POA: Diagnosis not present

## 2020-09-26 DIAGNOSIS — I2510 Atherosclerotic heart disease of native coronary artery without angina pectoris: Secondary | ICD-10-CM | POA: Diagnosis not present

## 2020-09-26 DIAGNOSIS — E039 Hypothyroidism, unspecified: Secondary | ICD-10-CM | POA: Diagnosis not present

## 2020-09-26 DIAGNOSIS — E1165 Type 2 diabetes mellitus with hyperglycemia: Secondary | ICD-10-CM | POA: Diagnosis not present

## 2020-09-26 DIAGNOSIS — I10 Essential (primary) hypertension: Secondary | ICD-10-CM | POA: Diagnosis not present

## 2020-09-26 DIAGNOSIS — Z720 Tobacco use: Secondary | ICD-10-CM | POA: Diagnosis not present

## 2020-09-26 DIAGNOSIS — Z794 Long term (current) use of insulin: Secondary | ICD-10-CM | POA: Diagnosis not present

## 2020-09-26 DIAGNOSIS — E782 Mixed hyperlipidemia: Secondary | ICD-10-CM | POA: Diagnosis not present

## 2020-09-26 DIAGNOSIS — Z0000 Encounter for general adult medical examination without abnormal findings: Secondary | ICD-10-CM | POA: Diagnosis not present

## 2020-09-27 DIAGNOSIS — Z720 Tobacco use: Secondary | ICD-10-CM | POA: Diagnosis not present

## 2020-09-27 DIAGNOSIS — E782 Mixed hyperlipidemia: Secondary | ICD-10-CM | POA: Diagnosis not present

## 2020-09-27 DIAGNOSIS — I2510 Atherosclerotic heart disease of native coronary artery without angina pectoris: Secondary | ICD-10-CM | POA: Diagnosis not present

## 2020-09-27 DIAGNOSIS — E1165 Type 2 diabetes mellitus with hyperglycemia: Secondary | ICD-10-CM | POA: Diagnosis not present

## 2020-09-27 DIAGNOSIS — Z794 Long term (current) use of insulin: Secondary | ICD-10-CM | POA: Diagnosis not present

## 2020-09-27 DIAGNOSIS — Z6841 Body Mass Index (BMI) 40.0 and over, adult: Secondary | ICD-10-CM | POA: Diagnosis not present

## 2020-09-27 DIAGNOSIS — I10 Essential (primary) hypertension: Secondary | ICD-10-CM | POA: Diagnosis not present

## 2020-09-27 DIAGNOSIS — E039 Hypothyroidism, unspecified: Secondary | ICD-10-CM | POA: Diagnosis not present

## 2020-10-04 ENCOUNTER — Encounter: Payer: Self-pay | Admitting: Specialist

## 2020-10-04 ENCOUNTER — Ambulatory Visit (INDEPENDENT_AMBULATORY_CARE_PROVIDER_SITE_OTHER): Payer: Medicaid Other | Admitting: Specialist

## 2020-10-04 ENCOUNTER — Other Ambulatory Visit: Payer: Self-pay

## 2020-10-04 VITALS — BP 113/64 | HR 68 | Ht 68.0 in | Wt 292.0 lb

## 2020-10-04 DIAGNOSIS — M48062 Spinal stenosis, lumbar region with neurogenic claudication: Secondary | ICD-10-CM

## 2020-10-04 DIAGNOSIS — M5136 Other intervertebral disc degeneration, lumbar region: Secondary | ICD-10-CM

## 2020-10-04 DIAGNOSIS — M4316 Spondylolisthesis, lumbar region: Secondary | ICD-10-CM

## 2020-10-04 MED ORDER — HYDROCODONE-ACETAMINOPHEN 5-325 MG PO TABS: 1.0000 | ORAL_TABLET | Freq: Four times a day (QID) | ORAL | 0 refills | Status: DC | PRN

## 2020-10-04 NOTE — Progress Notes (Signed)
Office Visit Note   Patient: [name]           Date of Birth: [birth year]           MRN: [id number] Visit Date: 10/04/2020              Requested by: Benito Mccreedy, MD [id number] [place],  [place] [address] PCP: Benito Mccreedy, MD   Assessment & Plan: Visit Diagnoses:  1. Spondylolisthesis, lumbar region   2. Degenerative disc disease, lumbar   3. Spinal stenosis of lumbar region with neurogenic claudication     Plan: Avoid bending, stooping and avoid lifting weights greater than 10 lbs. Avoid prolong standing and walking. Order for a new walker with wheels. Surgery scheduling secretary Kandice Hams, will call you in the next week to schedule for surgery.  Surgery recommended is a one level lumbar fusion L4-5 this would be done with rods, screws and cages with local bone graft and allograft (donor bone graft). Take hydrocodone for for pain. Risk of surgery includes risk of infection 1 in 200 patients, bleeding 1/4% chance you would need a transfusion.   Risk to the nerves is one in 10,000. You will need to use a brace for 3 months and wean from the brace on the 4th month. Expect improved walking and standing tolerance. Expect relief of leg pain but numbness may persist depending on the length and degree of pressure that has been present.   Follow-Up Instructions: Return in about 4 weeks (around 07/12/2020).     Follow-Up Instructions: No follow-ups on file.   Orders:  No orders of the defined types were placed in this encounter.  No orders of the defined types were placed in this encounter.     Procedures: No procedures performed   Clinical Data: No additional findings.   Subjective: Chief Complaint  Patient presents with   Lower Back - Follow-up    She states that her Ha1c is down to 7.9%, she will have her PCP fax a copy of the labs results    60 year old female with pain in the back and right leg numbness and weakness with  standing and walking. She is leaning on furniture and using a walker and a cane for ambulation. She is numbness into the right back and right buttock and into the right anterior thigh and shin. She was to undergo TLIF and decompression for spondylolisthesis and stenosis but her Hgb A ic has been elevated and it now has decreased to 7.9 .  Review of Systems   Objective: Vital Signs: BP 113/64 (BP Location: Left Arm, Patient Position: Sitting)   Pulse 68   Ht '5\' 8"'$  (1.727 m)   Wt 292 lb (132.5 kg)   BMI 44.40 kg/m   Physical Exam Constitutional:      Appearance: She is well-developed.  HENT:     Head: Normocephalic and atraumatic.  Eyes:     Pupils: Pupils are equal, round, and reactive to light.  Pulmonary:     Effort: Pulmonary effort is normal.     Breath sounds: Normal breath sounds.  Abdominal:     General: Bowel sounds are normal.     Palpations: Abdomen is soft.  Musculoskeletal:     Cervical back: Normal range of motion and neck supple.     Lumbar back: Negative right straight leg raise test and negative left straight leg raise test.  Skin:    General: Skin is warm and dry.  Neurological:     Mental Status: She is alert and oriented to person, place, and time.  Psychiatric:        Behavior: Behavior normal.        Thought Content: Thought content normal.        Judgment: Judgment normal.   Back Exam   Tenderness  The patient is experiencing tenderness in the lumbar.  Range of Motion  Extension:  abnormal  Flexion:  normal  Lateral bend right:  abnormal  Lateral bend left:  abnormal  Rotation right:  abnormal  Rotation left:  abnormal   Muscle Strength  Right Quadriceps:  4/5  Left Quadriceps:  5/5  Right Hamstrings:  5/5  Left Hamstrings:  5/5   Tests  Straight leg raise right: negative Straight leg raise left: negative  Other  Toe walk: normal Heel walk: normal Sensation: normal Erythema: no back redness Scars: absent    Specialty Comments:   No specialty comments available.  Imaging: No results found.   PMFS History: Patient Active Problem List   Diagnosis Date Noted   Severe obstructive sleep apnea 11/09/2018   Healthcare maintenance 11/09/2018   At risk for obstructive sleep apnea 04/16/2018   Acquired hypothyroidism 03/17/2018   DOE (dyspnea on exertion) 03/16/2018   NSTEMI (non-ST elevated myocardial infarction) ([place]) 08/18/2017   Right sided sciatica 07/10/2017   Right low back pain 07/10/2017   Chest pain 09/29/2011   Morbid obesity ([place]) 09/29/2011   DM type 2 (diabetes mellitus, type 2) ([place]) 09/29/2011   HTN (hypertension) 09/29/2011   Pneumonia, bacterial 08/07/2010   Incidental lung nodule, > 51m and < 844m07/04/2010   Bronchitis 08/07/2010   Past Medical History:  Diagnosis Date   ALLERGIC RHINITIS    Allergy to IVP dye    Di[place] type 2 [place]   History of tobacco abuse    Quit 2011   Hypertension    Hypertriglyceridemia    Hypothyroidism    NSTEMI (non-ST elevated myocardial infarction) [place]   NO CAD by cath, ? coronary vasospasm   Obesity    Sleep apnea     Family History  Problem Relation Age of Onset   Heart disease Mother        has pacemaker   Diabetes Mother    Diabetes Other        strong family history   Cancer Maternal Uncle    Hypertension Brother    Diabetes Brother    Hypertension Brother     Past Surgical History:  Procedure Laterality Date   CORONARY STENT INTERVENTION N/A 08/19/2017   Procedure: CORONARY STENT INTERVENTION;  Surgeon: GaAdrian ProwsMD;  [facility];  Service: Cardiovascular;  Laterality: N/A;   LEFT HEART CATH AND CORONARY ANGIOGRAPHY N/A 08/19/2017   Procedure: LEFT HEART CATH AND CORONARY ANGIOGRAPHY;  Surgeon: GaAdrian ProwsMD;  [place];  Service: Cardiovascular;  Laterality: N/A;   LEFT HEART CATHETERIZATION WITH CORONARY ANGIOGRAM N/A 09/30/2011   Procedure: LEFT HEART CATHETERIZATION WITH  CORONARY ANGIOGRAM;  Surgeon: ChBurnell BlanksMD;  [facility];  Service: Cardiovascular;  Laterality: N/A;   TUBAL LIGATION     VESICOVAGINAL FISTULA CLOSURE W/ TAH     Social History   Occupational History   Occupation: Retired  Tobacco Use   Smoking status: Former    Packs/day: 0.80    Years: 36.00    Pack years: 28.80    Types: Cigarettes  Quit date: 02/05/2011    Years since quitting: 9.6   Smokeless tobacco: Never  Vaping Use   Vaping Use: Never used  Substance and Sexual Activity   Alcohol use: Yes    Comment: VERY RARE   Drug use: No   Sexual activity: Not on file

## 2020-10-04 NOTE — Patient Instructions (Signed)
Plan: Avoid bending, stooping and avoid lifting weights greater than 10 lbs. Avoid prolong standing and walking. Order for a new walker with wheels. Surgery scheduling secretary Kandice Hams, will call you in the next week to schedule for surgery.  Surgery recommended is a one level lumbar fusion L4-5 this would be done with rods, screws and cages with local bone graft and allograft (donor bone graft). Take hydrocodone for for pain. Risk of surgery includes risk of infection 1 in 200 patients, bleeding 1/4% chance you would need a transfusion.   Risk to the nerves is one in 10,000. You will need to use a brace for 3 months and wean from the brace on the 4th month. Expect improved walking and standing tolerance. Expect relief of leg pain but numbness may persist depending on the length and degree of pressure that has been present.   Follow-Up Instructions: Return in about 4 weeks (around 07/12/2020).

## 2020-10-12 DIAGNOSIS — E870 Hyperosmolality and hypernatremia: Secondary | ICD-10-CM | POA: Diagnosis not present

## 2020-10-17 ENCOUNTER — Other Ambulatory Visit: Payer: Self-pay | Admitting: Internal Medicine

## 2020-10-17 ENCOUNTER — Other Ambulatory Visit (HOSPITAL_COMMUNITY): Payer: Self-pay | Admitting: Internal Medicine

## 2020-10-17 DIAGNOSIS — Z1231 Encounter for screening mammogram for malignant neoplasm of breast: Secondary | ICD-10-CM

## 2020-10-26 DIAGNOSIS — Z720 Tobacco use: Secondary | ICD-10-CM | POA: Diagnosis not present

## 2020-10-26 DIAGNOSIS — I10 Essential (primary) hypertension: Secondary | ICD-10-CM | POA: Diagnosis not present

## 2020-10-26 DIAGNOSIS — Z794 Long term (current) use of insulin: Secondary | ICD-10-CM | POA: Diagnosis not present

## 2020-10-26 DIAGNOSIS — J0110 Acute frontal sinusitis, unspecified: Secondary | ICD-10-CM | POA: Diagnosis not present

## 2020-10-26 DIAGNOSIS — E039 Hypothyroidism, unspecified: Secondary | ICD-10-CM | POA: Diagnosis not present

## 2020-10-26 DIAGNOSIS — E782 Mixed hyperlipidemia: Secondary | ICD-10-CM | POA: Diagnosis not present

## 2020-10-26 DIAGNOSIS — E1165 Type 2 diabetes mellitus with hyperglycemia: Secondary | ICD-10-CM | POA: Diagnosis not present

## 2020-10-26 DIAGNOSIS — I2510 Atherosclerotic heart disease of native coronary artery without angina pectoris: Secondary | ICD-10-CM | POA: Diagnosis not present

## 2020-11-06 ENCOUNTER — Other Ambulatory Visit: Payer: Self-pay

## 2020-11-09 DIAGNOSIS — E039 Hypothyroidism, unspecified: Secondary | ICD-10-CM | POA: Diagnosis not present

## 2020-11-09 DIAGNOSIS — E1165 Type 2 diabetes mellitus with hyperglycemia: Secondary | ICD-10-CM | POA: Diagnosis not present

## 2020-11-09 DIAGNOSIS — Z6841 Body Mass Index (BMI) 40.0 and over, adult: Secondary | ICD-10-CM | POA: Diagnosis not present

## 2020-11-09 DIAGNOSIS — E782 Mixed hyperlipidemia: Secondary | ICD-10-CM | POA: Diagnosis not present

## 2020-11-09 DIAGNOSIS — I2510 Atherosclerotic heart disease of native coronary artery without angina pectoris: Secondary | ICD-10-CM | POA: Diagnosis not present

## 2020-11-09 DIAGNOSIS — Z720 Tobacco use: Secondary | ICD-10-CM | POA: Diagnosis not present

## 2020-11-09 DIAGNOSIS — I10 Essential (primary) hypertension: Secondary | ICD-10-CM | POA: Diagnosis not present

## 2020-11-09 DIAGNOSIS — Z794 Long term (current) use of insulin: Secondary | ICD-10-CM | POA: Diagnosis not present

## 2020-11-16 ENCOUNTER — Ambulatory Visit (INDEPENDENT_AMBULATORY_CARE_PROVIDER_SITE_OTHER): Payer: Medicare HMO | Admitting: Surgery

## 2020-11-16 ENCOUNTER — Other Ambulatory Visit: Payer: Self-pay

## 2020-11-16 ENCOUNTER — Encounter: Payer: Self-pay | Admitting: Surgery

## 2020-11-16 VITALS — BP 122/75 | HR 66 | Ht 68.0 in | Wt 292.0 lb

## 2020-11-16 DIAGNOSIS — M5136 Other intervertebral disc degeneration, lumbar region: Secondary | ICD-10-CM

## 2020-11-16 DIAGNOSIS — M4316 Spondylolisthesis, lumbar region: Secondary | ICD-10-CM

## 2020-11-16 NOTE — Progress Notes (Signed)
Surgical Instructions    Your procedure is scheduled on 11/27/20.  Report to [facility] Main Entrance "A" at 5:30 A.M., then check in with the Admitting office.  Call this number if you have problems the morning of surgery:  [phone]   If you have any questions prior to your surgery date call [phone]: Open Monday-Friday 8am-4pm    Remember:  Do not eat after midnight the night before your surgery  You may drink clear liquids until 4:30 the morning of your surgery.   Clear liquids allowed are: Water, Non-Citrus Juices (without pulp), Carbonated Beverages, Clear Tea, Black Coffee ONLY (NO MILK, CREAM OR POWDERED CREAMER of any kind), and Gatorade    Take these medicines the morning of surgery with A SIP OF WATER: isosorbide dinitrate (ISORDIL) levothyroxine (SYNTHROID)  metoprolol succinate (TOPROL-XL) MITIGARE rosuvastatin (CRESTOR)  As Needed:  HYDROcodone-acetaminophen (NORCO/VICODIN) nitroGLYCERIN (NITROSTAT)   As of today, STOP taking any Aspirin (unless otherwise instructed by your surgeon) Aleve, Naproxen, Ibuprofen, Motrin, Advil, Goody's, BC's, all herbal medications, fish oil, and all vitamins.  WHAT DO I DO ABOUT MY DIABETES MEDICATION?   Do not take oral diabetes medicines (pills) the morning of surgery.  THE NIGHT BEFORE SURGERY, take ___50%___of Lantus___insulin.       The day of surgery, do not take other diabetes injectables, including Byetta (exenatide), Bydureon (exenatide ER), Victoza (liraglutide), or Trulicity (dulaglutide).  If your CBG is greater than 220 mg/dL, you may take  of your sliding scale (correction) dose of insulin. DO NOT take Novolog day of surgery unless your blood sugar is greater than 220.   HOW TO MANAGE YOUR DIABETES BEFORE AND AFTER SURGERY  Why is it important to control my blood sugar before and after surgery? Improving blood sugar levels before and after surgery helps healing and can limit problems. A way of  improving blood sugar control is eating a healthy diet by:  Eating less sugar and carbohydrates  Increasing activity/exercise  Talking with your doctor about reaching your blood sugar goals High blood sugars (greater than 180 mg/dL) can raise your risk of infections and slow your recovery, so you will need to focus on controlling your diabetes during the weeks before surgery. Make sure that the doctor who takes care of your diabetes knows about your planned surgery including the date and location.  How do I manage my blood sugar before surgery? Check your blood sugar at least 4 times a day, starting 2 days before surgery, to make sure that the level is not too high or low.  Check your blood sugar the morning of your surgery when you wake up and every 2 hours until you get to the Short Stay unit.  If your blood sugar is less than 70 mg/dL, you will need to treat for low blood sugar: Do not take insulin. Treat a low blood sugar (less than 70 mg/dL) with  cup of clear juice (cranberry or apple), 4 glucose tablets, OR glucose gel. Recheck blood sugar in 15 minutes after treatment (to make sure it is greater than 70 mg/dL). If your blood sugar is not greater than 70 mg/dL on recheck, call [phone] for further instructions. Report your blood sugar to the short stay nurse when you get to Short Stay.  If you are admitted to the hospital after surgery: Your blood sugar will be checked by the staff and you will probably be given insulin after surgery (instead of oral diabetes medicines) to make sure you have good blood  sugar levels. The goal for blood sugar control after surgery is 80-180 mg/dL.      After your COVID test   You are not required to quarantine however you are required to wear a well-fitting mask when you are out and around people not in your household.  If your mask becomes wet or soiled, replace with a new one.  Wash your hands often with soap and water for 20 seconds or clean  your hands with an alcohol-based hand sanitizer that contains at least 60% alcohol.  Do not share personal items.  Notify your provider: if you are in close contact with someone who has COVID  or if you develop a fever of 100.4 or greater, sneezing, cough, sore throat, shortness of breath or body aches.             Do not wear jewelry or makeup Do not wear lotions, powders, perfumes  or deodorant. Do not shave 48 hours prior to surgery.  Do not bring valuables to the hospital. DO Not wear nail polish, gel polish, artificial nails, or any other type of covering on natural nails including finger and toenails. If patients have artificial nails, gel coating, etc. that need to be removed by a nail salon, please have this removed prior to surgery or surgery may need to be canceled/delayed if the surgeon/ anesthesia feels like the patient is unable to be adequately monitored.             [place] is not responsible for any belongings or valuables.  Do NOT Smoke (Tobacco/Vaping)  24 hours prior to your procedure  If you use a CPAP at night, you may bring your mask for your overnight stay.   Contacts, glasses, hearing aids, dentures or partials may not be worn into surgery, please bring cases for these belongings   For patients admitted to the hospital, discharge time will be determined by your treatment team.   Patients discharged the day of surgery will not be allowed to drive home, and someone needs to stay with them for 24 hours.  NO VISITORS WILL BE ALLOWED IN PRE-OP WHERE PATIENTS ARE PREPPED FOR SURGERY.  ONLY 1 SUPPORT PERSON MAY BE PRESENT IN THE WAITING ROOM WHILE YOU ARE IN SURGERY.  IF YOU ARE TO BE ADMITTED, ONCE YOU ARE IN YOUR ROOM YOU WILL BE ALLOWED TWO (2) VISITORS. 1 (ONE) VISITOR MAY STAY OVERNIGHT BUT MUST ARRIVE TO THE ROOM BY 8pm.  Minor children may have two parents present. Special consideration for safety and communication needs will be reviewed on a case by case  basis.  Special instructions:    Oral Hygiene is also important to reduce your risk of infection.  Remember - BRUSH YOUR TEETH THE MORNING OF SURGERY WITH YOUR REGULAR TOOTHPASTE   - Preparing For Surgery  Before surgery, you can play an important role. Because skin is not sterile, your skin needs to be as free of germs as possible. You can reduce the number of germs on your skin by washing with CHG (chlorahexidine gluconate) Soap before surgery.  CHG is an antiseptic cleaner which kills germs and bonds with the skin to continue killing germs even after washing.     Please do not use if you have an allergy to CHG or antibacterial soaps. If your skin becomes reddened/irritated stop using the CHG.  Do not shave (including legs and underarms) for at least 48 hours prior to first CHG shower. It is OK to shave  your face.  Please follow these instructions carefully.     Shower the NIGHT BEFORE SURGERY and the MORNING OF SURGERY with CHG Soap.   If you chose to wash your hair, wash your hair first as usual with your normal shampoo. After you shampoo, rinse your hair and body thoroughly to remove the shampoo.  Then [employer] and genitals (private parts) with your normal soap and rinse thoroughly to remove soap.  After that Use CHG Soap as you would any other liquid soap. You can apply CHG directly to the skin and wash gently with a scrungie or a clean washcloth.   Apply the CHG Soap to your body ONLY FROM THE NECK DOWN.  Do not use on open wounds or open sores. Avoid contact with your eyes, ears, mouth and genitals (private parts). Wash Face and genitals (private parts)  with your normal soap.   Wash thoroughly, paying special attention to the area where your surgery will be performed.  Thoroughly rinse your body with warm water from the neck down.  DO NOT shower/wash with your normal soap after using and rinsing off the CHG Soap.  Pat yourself dry with a CLEAN TOWEL.  Wear CLEAN  PAJAMAS to bed the night before surgery  Place CLEAN SHEETS on your bed the night before your surgery  DO NOT SLEEP WITH PETS.   Day of Surgery:  Take a shower with CHG soap. Wear Clean/Comfortable clothing the morning of surgery Do not apply any deodorants/lotions.   Remember to brush your teeth WITH YOUR REGULAR TOOTHPASTE.   Please read over the following fact sheets that you were given.

## 2020-11-17 ENCOUNTER — Encounter (HOSPITAL_COMMUNITY): Payer: Self-pay

## 2020-11-17 ENCOUNTER — Other Ambulatory Visit: Payer: Self-pay

## 2020-11-17 ENCOUNTER — Encounter (HOSPITAL_COMMUNITY)
Admission: RE | Admit: 2020-11-17 | Discharge: 2020-11-17 | Disposition: A | Discharge status: Home / Self Care | Payer: Medicare HMO | Source: Ambulatory Visit | Attending: Specialist | Admitting: Specialist

## 2020-11-17 DIAGNOSIS — Z794 Long term (current) use of insulin: Secondary | ICD-10-CM | POA: Insufficient documentation

## 2020-11-17 DIAGNOSIS — I10 Essential (primary) hypertension: Secondary | ICD-10-CM | POA: Insufficient documentation

## 2020-11-17 DIAGNOSIS — M48061 Spinal stenosis, lumbar region without neurogenic claudication: Secondary | ICD-10-CM | POA: Insufficient documentation

## 2020-11-17 DIAGNOSIS — Z7982 Long term (current) use of aspirin: Secondary | ICD-10-CM | POA: Insufficient documentation

## 2020-11-17 DIAGNOSIS — Z01812 Encounter for preprocedural laboratory examination: Secondary | ICD-10-CM | POA: Diagnosis not present

## 2020-11-17 DIAGNOSIS — I2510 Atherosclerotic heart disease of native coronary artery without angina pectoris: Secondary | ICD-10-CM | POA: Diagnosis not present

## 2020-11-17 DIAGNOSIS — M4316 Spondylolisthesis, lumbar region: Secondary | ICD-10-CM | POA: Diagnosis not present

## 2020-11-17 DIAGNOSIS — Z79899 Other long term (current) drug therapy: Secondary | ICD-10-CM | POA: Diagnosis not present

## 2020-11-17 DIAGNOSIS — Z6841 Body Mass Index (BMI) 40.0 and over, adult: Secondary | ICD-10-CM | POA: Insufficient documentation

## 2020-11-17 DIAGNOSIS — Z7901 Long term (current) use of anticoagulants: Secondary | ICD-10-CM | POA: Diagnosis not present

## 2020-11-17 DIAGNOSIS — G4733 Obstructive sleep apnea (adult) (pediatric): Secondary | ICD-10-CM | POA: Insufficient documentation

## 2020-11-17 DIAGNOSIS — I252 Old myocardial infarction: Secondary | ICD-10-CM | POA: Insufficient documentation

## 2020-11-17 DIAGNOSIS — E118 Type 2 diabetes mellitus with unspecified complications: Secondary | ICD-10-CM | POA: Insufficient documentation

## 2020-11-17 DIAGNOSIS — Z87891 Personal history of nicotine dependence: Secondary | ICD-10-CM | POA: Diagnosis not present

## 2020-11-17 HISTORY — DX: Atherosclerotic heart disease of native coronary artery without angina pectoris: I25.10

## 2020-11-17 LAB — TYPE AND SCREEN
ABO/RH(D): B POS
Antibody Screen: NEGATIVE

## 2020-11-17 LAB — CBC
HCT: 40.8 % (ref 36.0–46.0)
Hemoglobin: 13.1 g/dL (ref 12.0–15.0)
MCH: 28.9 pg (ref 26.0–34.0)
MCHC: 32.1 g/dL (ref 30.0–36.0)
MCV: 90.1 fL (ref 80.0–100.0)
Platelets: 303 10*3/uL (ref 150–400)
RBC: 4.53 MIL/uL (ref 3.87–5.11)
RDW: 13.6 % (ref 11.5–15.5)
WBC: 9.9 10*3/uL (ref 4.0–10.5)
nRBC: 0 % (ref 0.0–0.2)

## 2020-11-17 LAB — BASIC METABOLIC PANEL
Anion gap: 8 (ref 5–15)
BUN: 14 mg/dL (ref 6–20)
CO2: 23 mmol/L (ref 22–32)
Calcium: 8.8 mg/dL — ABNORMAL LOW (ref 8.9–10.3)
Chloride: 106 mmol/L (ref 98–111)
Creatinine, Ser: 1.02 mg/dL — ABNORMAL HIGH (ref 0.44–1.00)
GFR, Estimated: >60 mL/min (ref >60)
Glucose, Bld: 156 mg/dL — ABNORMAL HIGH (ref 70–99)
Potassium: 4 mmol/L (ref 3.5–5.1)
Sodium: 137 mmol/L (ref 135–145)

## 2020-11-17 LAB — SURGICAL PCR SCREEN
MRSA, PCR: NEGATIVE
Staphylococcus aureus: NEGATIVE

## 2020-11-17 LAB — GLUCOSE, CAPILLARY: Glucose-Capillary: 153 mg/dL — ABNORMAL HIGH (ref 70–99)

## 2020-11-17 NOTE — Progress Notes (Signed)
PCP: Benito Mccreedy, MD Cardiologist: Rex Kras, MD  EKG: 06/01/20 CXR: 03/10/18 ECHO: 06/08/20 Stress Test: 06/07/20 Cardiac Cath: 08/17/17  Fasting Blood Sugar- 150's Checks Blood Sugar__4_ times a day  ASA: Patient to call office today for instructions Blood Thinner: No  OSA/CPAP: Yes, wears nightly  Covid test scheduled 11/23/20 at 0900  Anesthesia Review: Yes, cardiac history  Patient denies shortness of breath, fever, cough, and chest pain at PAT appointment.  Patient verbalized understanding of instructions provided today at the PAT appointment.  Patient asked to review instructions at home and day of surgery.

## 2020-11-20 ENCOUNTER — Encounter: Payer: Self-pay | Admitting: Specialist

## 2020-11-20 NOTE — Anesthesia Preprocedure Evaluation (Addendum)
Anesthesia Evaluation  Patient identified by MRN, date of birth, ID band Patient awake    Reviewed: Allergy & Precautions, NPO status , Patient's Chart, lab work & pertinent test results, reviewed documented beta blocker date and time   History of Anesthesia Complications Negative for: history of anesthetic complications  Airway Mallampati: II  TM Distance: >3 FB Neck ROM: Full    Dental no notable dental hx.    Pulmonary sleep apnea , former smoker,    Pulmonary exam normal        Cardiovascular hypertension, Pt. on medications and Pt. on home beta blockers + CAD and + Past MI  Normal cardiovascular exam  TTE 06/08/2020: Technically difficult study, EF 38-45%, grade I diastolic dysfunction    Neuro/Psych negative neurological ROS  negative psych ROS   GI/Hepatic negative GI ROS, Neg liver ROS,   Endo/Other  diabetes, Type 2, Insulin DependentHypothyroidism Morbid obesity  Renal/GU negative Renal ROS  negative genitourinary   Musculoskeletal negative musculoskeletal ROS (+)   Abdominal   Peds  Hematology negative hematology ROS (+)   Anesthesia Other Findings Day of surgery medications reviewed with patient.  Reproductive/Obstetrics negative OB ROS                           Anesthesia Physical Anesthesia Plan  ASA: 3  Anesthesia Plan: General   Post-op Pain Management:    Induction: Intravenous  PONV Risk Score and Plan: 4 or greater and Treatment may vary due to age or medical condition, Ondansetron, Dexamethasone and Midazolam  Airway Management Planned: Oral ETT  Additional Equipment:   Intra-op Plan:   Post-operative Plan: Extubation in OR  Informed Consent: I have reviewed the patients History and Physical, chart, labs and discussed the procedure including the risks, benefits and alternatives for the proposed anesthesia with the patient or authorized representative who has  indicated his/her understanding and acceptance.     Dental advisory given  Plan Discussed with: CRNA  Anesthesia Plan Comments: (PAT note written 11/20/2020 by Myra Gianotti, PA-C. )      Anesthesia Quick Evaluation

## 2020-11-20 NOTE — Progress Notes (Signed)
Anesthesia Chart Review:  Case: [id number] Date/Time: 11/27/20 0715   Procedure: RIGHT L4-5 TRANSFORAMINAL LUMBAR INTEBODY FUSION WITH PEDICLE SCREWS, RODS AND CAGE, LOCAL AND ALLOGRAFT BONE GRAFT, VIVIGEN   Anesthesia type: General   Pre-op diagnosis: lumbar spinal stenosis with spondylolisthesis L4-5   Location: [facility] OR ROOM 05 / [place] OR   Surgeons: Jessy Oto, MD       DISCUSSION: Patient is 60 year old female scheduled for the above procedure.  History includes former smoker (quit 02/05/2011), HTN, DM2, CAD (NSTEMI 09/29/2011 due to coronary vasospasm versus small vessel disease; NSTEMI 08/17/2017, s/p DES RCA 08/19/2017), hypothyroidism, hypertriglyceridemia, OSA (wears CPAP), IVP dye allergy, hysterectomy (with RSO 06/19/2010). BMI is consistent with morbid obesity.  Last visit with cardiologist Dr. Terri Skains on 08/31/2020 for follow-up and preoperative evaluation. Per Dr. Terri Skains,  "Preoperative risk stratification: No active chest pain or anginal equivalent.   Overall euvolemic and not in congestive heart failure. EKG shows normal sinus rhythm without underlying ischemia or injury pattern. Reviewed the results of the echocardiogram and nuclear stress test with the patient.  No additional work-up is warranted. Patient is considered to be an acceptable candidate for the upcoming noncardiac surgery and is at low to moderate risk for cardiovascular complications given her chronic comorbid conditions.  But this is not modifying and she can proceed with surgery once cleared by her surgeon.   Continue beta-blocker therapy."    Last ASA 11/20/2020 per surgeon.   Preoperative COVID-19 testing is scheduled for 11/23/2020. Anesthesia team to evaluate on the day of surgery.    VS: BP (!) 145/73   Pulse 65   Temp 36.9 C (Oral)   Resp 18   Ht 5\' 8"  (1.727 m)   Wt 134.3 kg   SpO2 99%   BMI 45.01 kg/m    PROVIDERS: Benito Mccreedy, MD is PCP  - Rex Kras, DO is cardiologist. Last visit  08/31/2020. 3 month or post-operative follow-up planned. She has formerly seen Dr. Mertie Moores [phone]), Dr. [name] (08/2017), and  [facility] cardiologists Dr. Assunta Curtis & Dr. Layne Benton 10/2017-11/2018.  Christinia Gully, MD is pulmonologist. Seen on 03/16/2018 for DOE with 3 episodes of hemoptysis, resolved. Last visit 11/09/2018 with Wyn Quaker, NP. PFTs unremarkable. Sleep study showed severe OSA, and she was started on CPAP. Plan for six month follow-up OSA with Sherrilyn Rist, MD, although no additional pulmonary visits seen.        LABS: Preoperative labs noted. A1c 7.9% on 09/27/2020 at [facility] (results faxed from [facility], copy on shadow chart). (all labs ordered are listed, but only abnormal results are displayed)  Labs Reviewed  GLUCOSE, CAPILLARY - Abnormal; Notable for the following components:      Result Value   Glucose-Capillary 153 (*)    All other components within normal limits  BASIC METABOLIC PANEL - Abnormal; Notable for the following components:   Glucose, Bld 156 (*)    Creatinine, Ser 1.02 (*)    Calcium 8.8 (*)    All other components within normal limits  SURGICAL PCR SCREEN  CBC  TYPE AND SCREEN    OTHER: Split Night Sleep Study 08/17/2018: IMPRESSIONS - Severe obstructive sleep apnea occurred during the diagnostic portion of the study (AHI = 35.1/hour). An optimal PAP pressure was selected for this patient ( 11 cm of water). Few hypopneas were noted at this final CPAP level - No significant central sleep apnea occurred during the diagnostic portion of the study (CAI = 1.7/hour). -  Moderate oxygen desaturation was noted during the diagnostic portion of the study (Min O2 =75.0%). - The patient snored with moderate snoring volume during the diagnostic portion of the study. - EKG findings include PVCs. - Clinically significant periodic limb movements did not occur during sleep. RECOMMENDATIONS - Trial of CPAP therapy on 11 cm H2O with a Small  size Resmed Full Face Mask AirFit F20 mask and heated humidification...  PFTs 04/16/2018:  FVC 2.95 (88%), post 2.95 (88%). FEV1 2.39 (90%), post 2.47 (94%). DLCO unc 21.62 (89%), cor 21.75 (90%). - Spirometry 03/16/2018  FEV1 2.4 (94%)  Ratio 0.86 nl curvature off all rx  Walk Test 03/16/2018 (as outlined in office note): "Walked RA x one lap =  approx 250 ft - stopped due to leg/back pain, no sob/ sats still 98% @ slow pace"     IMAGES: US Renal 09/14/2020: IMPRESSION: Unremarkable sonographic appearance of the kidneys. Bladder essentially nondistended at the time of the exam.   MRI L-spine 01/18/2020: IMPRESSION: - Multilevel spondylosis. 7 mm left L4-5 subarticular lesion may reflect a free disc fragment versus focal left facet hypertrophy grazing the descending left L5 nerve root. Mild spinal canal and mild-to-moderate neural foraminal narrowing at this level. - Mild right L2-3 and bilateral L3-4 neural foraminal narrowing.    EKG: 06/01/2020: Normal sinus rhythm, 72 bpm, normal axis, without underlying ischemia or injury pattern.     CV: Echocardiogram 06/08/2020:  Technically difficult study.  Left ventricle cavity is normal in size and wall thickness. Normal global  wall motion. Normal LV systolic function with visual EF 50-55%. Doppler  evidence of grade I (impaired) diastolic dysfunction, normal LAP.  No significant valvular abnormality.  Normal right atrial pressure.  No significant change compared to previous study in 2019.   Lexiscan Tetrofosmin stress test 06/07/2020: 1 Day Rest/Stress Protocol. Stress EKG is non-diagnostic, as this is pharmacological stress test using Lexiscan. No convincing evidence of reversible myocardial ischemia or prior infarct. Left ventricular wall thickness is preserved without regional wall motion abnormalities. Calculated LVEF  51%, visually appears preserved. No prior studies available for comparison.   Coronary angiogram  08/19/2017: Normal LV systolic function, EF 89%, normal LVEDP. Left main nonexistent, separate ostia for LAD and circumflex.  LAD has mild diffuse disease.  Circumflex is codominant with RCA and is more than normal. RCA is codominant with circumflex coronary artery.  Mid segment shows a ulcerated 80% stenosis S/P 3.0 x 20 mm Synergy, 80% to 0%, TIMI-3 to TIMI-3 flow maintained.   Recommend uninterrupted dual antiplatelet therapy with Aspirin 81mg  daily and Ticagrelor 90mg  twice daily for a minimum of 12 months (ACS - Class I recommendation).  Aggressive risk factor modification is also indicated.     48 Hour Holter Monitor 10/17/2011: Normal sinus rhythm.  1 episode of nonsustained VT.  Past Medical History:  Diagnosis Date   ALLERGIC RHINITIS    Allergy to IVP dye    Coronary artery disease    Diabetes mellitus, type 2 [place]    History of tobacco abuse    Quit 2011   Hypertension    Hypertriglyceridemia    Hypothyroidism    NSTEMI (non-ST elevated myocardial infarction) ([place]) 09/30/2011   NO CAD by cath, ? coronary vasospasm   Obesity    Sleep apnea     Past Surgical History:  Procedure Laterality Date   CORONARY STENT INTERVENTION N/A 08/19/2017   Procedure: CORONARY STENT INTERVENTION;  Surgeon: [name], MD;  Location: [place] CV LAB;  Service: Cardiovascular;  Laterality: N/A;   LEFT HEART CATH AND CORONARY ANGIOGRAPHY N/A 08/19/2017   Procedure: LEFT HEART CATH AND CORONARY ANGIOGRAPHY;  Surgeon: [name], MD;  Location: [place] CV LAB;  Service: Cardiovascular;  Laterality: N/A;   LEFT HEART CATHETERIZATION WITH CORONARY ANGIOGRAM N/A 09/30/2011   Procedure: LEFT HEART CATHETERIZATION WITH CORONARY ANGIOGRAM;  Surgeon: Burnell Blanks, MD;  Location: [facility] CATH LAB;  Service: Cardiovascular;  Laterality: N/A;   TUBAL LIGATION     VESICOVAGINAL FISTULA CLOSURE W/ TAH      MEDICATIONS:  amLODipine-valsartan (EXFORGE) 5-160 MG tablet   aspirin EC 81 MG tablet    HYDROcodone-acetaminophen (NORCO/VICODIN) 5-325 MG tablet   isosorbide dinitrate (ISORDIL) 30 MG tablet   LANTUS SOLOSTAR 100 UNIT/ML Solostar Pen   levothyroxine (SYNTHROID) 150 MCG tablet   losartan (COZAAR) 25 MG tablet   metoprolol succinate (TOPROL-XL) 50 MG 24 hr tablet   MITIGARE 0.6 MG CAPS   nitroGLYCERIN (NITROSTAT) 0.4 MG SL tablet   NOVOLOG FLEXPEN 100 UNIT/ML FlexPen   rosuvastatin (CRESTOR) 40 MG tablet   traZODone (DESYREL) 50 MG tablet   TRULICITY 1.5 JI/3.60YO SOPN   No current facility-administered medications for this encounter.    Myra Gianotti, PA-C Surgical Short Stay/Anesthesiology [facility] Phone [phone] [facility] Phone [phone] 11/20/2020 8:33 PM

## 2020-11-21 ENCOUNTER — Ambulatory Visit
Admission: RE | Admit: 2020-11-21 | Discharge: 2020-11-21 | Disposition: A | Discharge status: Home / Self Care | Payer: Medicare HMO | Source: Ambulatory Visit | Attending: Internal Medicine | Admitting: Internal Medicine

## 2020-11-21 ENCOUNTER — Other Ambulatory Visit: Payer: Self-pay

## 2020-11-21 DIAGNOSIS — Z1231 Encounter for screening mammogram for malignant neoplasm of breast: Secondary | ICD-10-CM

## 2020-11-23 ENCOUNTER — Other Ambulatory Visit (HOSPITAL_COMMUNITY)
Admission: RE | Admit: 2020-11-23 | Discharge: 2020-11-23 | Disposition: A | Discharge status: Home / Self Care | Payer: Medicare HMO | Source: Ambulatory Visit | Attending: Specialist | Admitting: Specialist

## 2020-11-23 DIAGNOSIS — Z01812 Encounter for preprocedural laboratory examination: Secondary | ICD-10-CM | POA: Diagnosis not present

## 2020-11-23 DIAGNOSIS — Z20822 Contact with and (suspected) exposure to covid-19: Secondary | ICD-10-CM | POA: Diagnosis not present

## 2020-11-23 DIAGNOSIS — Z01818 Encounter for other preprocedural examination: Secondary | ICD-10-CM

## 2020-11-23 LAB — SARS CORONAVIRUS 2 (TAT 6-24 HRS): SARS Coronavirus 2: NEGATIVE

## 2020-11-24 ENCOUNTER — Other Ambulatory Visit: Payer: Self-pay | Admitting: Cardiology

## 2020-11-24 DIAGNOSIS — I10 Essential (primary) hypertension: Secondary | ICD-10-CM

## 2020-11-24 NOTE — Progress Notes (Signed)
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  States that symptoms unchanged in previous visit.  She is wanting to proceed with L4-5 TLIF as scheduled.  Today history and physical performed.  Review of systems positive for sleep apnea with CPAP use.  Surgical procedure discussed along with potential rehab/recovery time.  All questions answered.  Advised patient to bring her CPAP mask and hose to use during her hospital admission.

## 2020-11-27 ENCOUNTER — Ambulatory Visit (HOSPITAL_COMMUNITY): Payer: Medicare HMO

## 2020-11-27 ENCOUNTER — Ambulatory Visit (HOSPITAL_COMMUNITY): Payer: Medicare HMO | Admitting: Vascular Surgery

## 2020-11-27 ENCOUNTER — Ambulatory Visit (HOSPITAL_COMMUNITY): Payer: Medicare HMO | Admitting: Certified Registered Nurse Anesthetist

## 2020-11-27 ENCOUNTER — Encounter (HOSPITAL_COMMUNITY): Payer: Self-pay | Admitting: Specialist

## 2020-11-27 ENCOUNTER — Other Ambulatory Visit: Payer: Self-pay

## 2020-11-27 ENCOUNTER — Observation Stay (HOSPITAL_COMMUNITY)
Admission: RE | Admit: 2020-11-27 | Discharge: 2020-11-28 | Disposition: A | Discharge status: Home / Self Care | Payer: Medicare HMO | Attending: Specialist | Admitting: Specialist

## 2020-11-27 ENCOUNTER — Ambulatory Visit (HOSPITAL_COMMUNITY)
Admission: RE | Disposition: A | Discharge status: Home / Self Care | Payer: Self-pay | Source: Home / Self Care | Attending: Specialist

## 2020-11-27 DIAGNOSIS — E119 Type 2 diabetes mellitus without complications: Secondary | ICD-10-CM | POA: Insufficient documentation

## 2020-11-27 DIAGNOSIS — M48061 Spinal stenosis, lumbar region without neurogenic claudication: Principal | ICD-10-CM | POA: Insufficient documentation

## 2020-11-27 DIAGNOSIS — I2510 Atherosclerotic heart disease of native coronary artery without angina pectoris: Secondary | ICD-10-CM | POA: Insufficient documentation

## 2020-11-27 DIAGNOSIS — Z01818 Encounter for other preprocedural examination: Secondary | ICD-10-CM

## 2020-11-27 DIAGNOSIS — Z419 Encounter for procedure for purposes other than remedying health state, unspecified: Secondary | ICD-10-CM

## 2020-11-27 DIAGNOSIS — Z981 Arthrodesis status: Secondary | ICD-10-CM | POA: Diagnosis not present

## 2020-11-27 DIAGNOSIS — Z794 Long term (current) use of insulin: Secondary | ICD-10-CM | POA: Insufficient documentation

## 2020-11-27 DIAGNOSIS — M4316 Spondylolisthesis, lumbar region: Secondary | ICD-10-CM | POA: Diagnosis not present

## 2020-11-27 DIAGNOSIS — Z7984 Long term (current) use of oral hypoglycemic drugs: Secondary | ICD-10-CM | POA: Diagnosis not present

## 2020-11-27 DIAGNOSIS — E1110 Type 2 diabetes mellitus with ketoacidosis without coma: Secondary | ICD-10-CM

## 2020-11-27 DIAGNOSIS — M48062 Spinal stenosis, lumbar region with neurogenic claudication: Secondary | ICD-10-CM | POA: Diagnosis not present

## 2020-11-27 DIAGNOSIS — I214 Non-ST elevation (NSTEMI) myocardial infarction: Secondary | ICD-10-CM | POA: Diagnosis not present

## 2020-11-27 DIAGNOSIS — I10 Essential (primary) hypertension: Secondary | ICD-10-CM | POA: Insufficient documentation

## 2020-11-27 DIAGNOSIS — E039 Hypothyroidism, unspecified: Secondary | ICD-10-CM | POA: Insufficient documentation

## 2020-11-27 DIAGNOSIS — Z79899 Other long term (current) drug therapy: Secondary | ICD-10-CM | POA: Diagnosis not present

## 2020-11-27 DIAGNOSIS — Z87891 Personal history of nicotine dependence: Secondary | ICD-10-CM | POA: Insufficient documentation

## 2020-11-27 DIAGNOSIS — M4326 Fusion of spine, lumbar region: Secondary | ICD-10-CM | POA: Diagnosis not present

## 2020-11-27 HISTORY — PX: LUMBAR DISC SURGERY: SHX700

## 2020-11-27 LAB — GLUCOSE, CAPILLARY
Glucose-Capillary: 165 mg/dL — ABNORMAL HIGH (ref 70–99)
Glucose-Capillary: 171 mg/dL — ABNORMAL HIGH (ref 70–99)
Glucose-Capillary: 118 mg/dL — ABNORMAL HIGH (ref 70–99)
Glucose-Capillary: 225 mg/dL — ABNORMAL HIGH (ref 70–99)
Glucose-Capillary: 253 mg/dL — ABNORMAL HIGH (ref 70–99)
Glucose-Capillary: 196 mg/dL — ABNORMAL HIGH (ref 70–99)
Glucose-Capillary: 160 mg/dL — ABNORMAL HIGH (ref 70–99)

## 2020-11-27 LAB — URINALYSIS, ROUTINE W REFLEX MICROSCOPIC
Bilirubin Urine: NEGATIVE
Glucose, UA: NEGATIVE mg/dL
Hgb urine dipstick: NEGATIVE
Ketones, ur: NEGATIVE mg/dL
Leukocytes,Ua: NEGATIVE
Nitrite: NEGATIVE
Protein, ur: NEGATIVE mg/dL
Specific Gravity, Urine: 1.02 (ref 1.005–1.030)
pH: 5 (ref 5.0–8.0)

## 2020-11-27 LAB — HEMOGLOBIN A1C
Hgb A1c MFr Bld: 7.5 % — ABNORMAL HIGH (ref 4.8–5.6)
Mean Plasma Glucose: 168.55 mg/dL

## 2020-11-27 LAB — ABO/RH: ABO/RH(D): B POS

## 2020-11-27 SURGERY — POSTERIOR LUMBAR FUSION 1 LEVEL
Anesthesia: General | Site: Back

## 2020-11-27 MED ORDER — NITROGLYCERIN 0.4 MG SL SUBL: 0.4000 mg | SUBLINGUAL_TABLET | SUBLINGUAL | Status: DC | PRN

## 2020-11-27 MED ORDER — THROMBIN 20000 UNITS EX SOLR
CUTANEOUS | Status: DC | PRN
  Administered 2020-11-27: 20 mL

## 2020-11-27 MED ORDER — FLEET ENEMA 7-19 GM/118ML RE ENEM: 1.0000 | ENEMA | Freq: Once | RECTAL | Status: DC | PRN

## 2020-11-27 MED ORDER — HYDROCODONE-ACETAMINOPHEN 10-325 MG PO TABS
1.0000 | ORAL_TABLET | ORAL | Status: DC | PRN
  Administered 2020-11-27: 1 via ORAL
  Filled 2020-11-27: qty 1

## 2020-11-27 MED ORDER — LEVOTHYROXINE SODIUM 75 MCG PO TABS
150.0000 ug | ORAL_TABLET | Freq: Every day | ORAL | Status: DC
  Administered 2020-11-28: 150 ug via ORAL
  Filled 2020-11-27: qty 2

## 2020-11-27 MED ORDER — PROPOFOL 10 MG/ML IV BOLUS
INTRAVENOUS | Status: DC | PRN
  Administered 2020-11-27: 170 mg via INTRAVENOUS
  Administered 2020-11-27: 30 mg via INTRAVENOUS

## 2020-11-27 MED ORDER — PROMETHAZINE HCL 25 MG/ML IJ SOLN
6.2500 mg | INTRAMUSCULAR | Status: DC | PRN
Start: 2020-11-27 — End: 2020-11-27

## 2020-11-27 MED ORDER — DOCUSATE SODIUM 100 MG PO CAPS
100.0000 mg | ORAL_CAPSULE | Freq: Two times a day (BID) | ORAL | Status: DC
  Administered 2020-11-27 – 2020-11-28 (×3): 100 mg via ORAL
  Filled 2020-11-27 (×3): qty 1

## 2020-11-27 MED ORDER — CHLORHEXIDINE GLUCONATE 0.12 % MT SOLN: 15.0000 mL | Freq: Once | OROMUCOSAL | Status: AC

## 2020-11-27 MED ORDER — HYDROMORPHONE HCL 1 MG/ML IJ SOLN
INTRAMUSCULAR | Status: AC
  Filled 2020-11-27: qty 0.5

## 2020-11-27 MED ORDER — IRBESARTAN 150 MG PO TABS
150.0000 mg | ORAL_TABLET | Freq: Every day | ORAL | Status: DC
  Administered 2020-11-27 – 2020-11-28 (×2): 150 mg via ORAL
  Filled 2020-11-27 (×2): qty 1

## 2020-11-27 MED ORDER — PHENYLEPHRINE 40 MCG/ML (10ML) SYRINGE FOR IV PUSH (FOR BLOOD PRESSURE SUPPORT)
PREFILLED_SYRINGE | INTRAVENOUS | Status: AC
  Filled 2020-11-27: qty 10

## 2020-11-27 MED ORDER — INSULIN ASPART 100 UNIT/ML IJ SOLN
15.0000 [IU] | Freq: Three times a day (TID) | INTRAMUSCULAR | Status: DC
  Administered 2020-11-27 – 2020-11-28 (×2): 15 [IU] via SUBCUTANEOUS

## 2020-11-27 MED ORDER — BISACODYL 5 MG PO TBEC: 5.0000 mg | DELAYED_RELEASE_TABLET | Freq: Every day | ORAL | Status: DC | PRN

## 2020-11-27 MED ORDER — SODIUM CHLORIDE 0.9% FLUSH: 3.0000 mL | INTRAVENOUS | Status: DC | PRN

## 2020-11-27 MED ORDER — HYDROCODONE-ACETAMINOPHEN 7.5-325 MG PO TABS
2.0000 | ORAL_TABLET | ORAL | Status: DC | PRN
  Administered 2020-11-27: 2 via ORAL
  Filled 2020-11-27: qty 2

## 2020-11-27 MED ORDER — ONDANSETRON HCL 4 MG/2ML IJ SOLN
INTRAMUSCULAR | Status: DC | PRN
  Administered 2020-11-27: 4 mg via INTRAVENOUS

## 2020-11-27 MED ORDER — SODIUM CHLORIDE 0.9% FLUSH
3.0000 mL | Freq: Two times a day (BID) | INTRAVENOUS | Status: DC
  Administered 2020-11-27: 3 mL via INTRAVENOUS

## 2020-11-27 MED ORDER — 0.9 % SODIUM CHLORIDE (POUR BTL) OPTIME
TOPICAL | Status: DC | PRN
  Administered 2020-11-27: 1000 mL

## 2020-11-27 MED ORDER — INSULIN ASPART 100 UNIT/ML IJ SOLN
INTRAMUSCULAR | Status: AC
  Filled 2020-11-27: qty 1

## 2020-11-27 MED ORDER — ROCURONIUM BROMIDE 10 MG/ML (PF) SYRINGE
PREFILLED_SYRINGE | INTRAVENOUS | Status: DC | PRN
  Administered 2020-11-27 (×2): 10 mg via INTRAVENOUS
  Administered 2020-11-27: 20 mg via INTRAVENOUS
  Administered 2020-11-27: 70 mg via INTRAVENOUS
  Administered 2020-11-27: 10 mg via INTRAVENOUS

## 2020-11-27 MED ORDER — SUGAMMADEX SODIUM 200 MG/2ML IV SOLN
INTRAVENOUS | Status: DC | PRN
  Administered 2020-11-27: 300 mg via INTRAVENOUS

## 2020-11-27 MED ORDER — METHOCARBAMOL 500 MG PO TABS
500.0000 mg | ORAL_TABLET | Freq: Four times a day (QID) | ORAL | Status: DC | PRN
  Administered 2020-11-27: 500 mg via ORAL
  Filled 2020-11-27: qty 1

## 2020-11-27 MED ORDER — COLCHICINE 0.3 MG HALF TABLET
0.6000 mg | ORAL_TABLET | Freq: Every day | ORAL | Status: DC
  Administered 2020-11-28: 0.6 mg via ORAL
  Filled 2020-11-27: qty 2

## 2020-11-27 MED ORDER — LIDOCAINE 2% (20 MG/ML) 5 ML SYRINGE
INTRAMUSCULAR | Status: AC
  Filled 2020-11-27: qty 5

## 2020-11-27 MED ORDER — BUPIVACAINE LIPOSOME 1.3 % IJ SUSP
10.0000 mL | Freq: Once | INTRAMUSCULAR | Status: DC
Start: 2020-11-27 — End: 2020-11-27

## 2020-11-27 MED ORDER — ORAL CARE MOUTH RINSE: 15.0000 mL | Freq: Once | OROMUCOSAL | Status: AC

## 2020-11-27 MED ORDER — ONDANSETRON HCL 4 MG/2ML IJ SOLN
INTRAMUSCULAR | Status: AC
  Filled 2020-11-27: qty 2

## 2020-11-27 MED ORDER — AMLODIPINE BESYLATE 5 MG PO TABS
5.0000 mg | ORAL_TABLET | Freq: Every day | ORAL | Status: DC
  Administered 2020-11-27 – 2020-11-28 (×2): 5 mg via ORAL
  Filled 2020-11-27 (×2): qty 1

## 2020-11-27 MED ORDER — THROMBIN (RECOMBINANT) 20000 UNITS EX SOLR
CUTANEOUS | Status: AC
  Filled 2020-11-27: qty 20000

## 2020-11-27 MED ORDER — LACTATED RINGERS IV SOLN: INTRAVENOUS | Status: DC

## 2020-11-27 MED ORDER — ALUM & MAG HYDROXIDE-SIMETH 200-200-20 MG/5ML PO SUSP: 30.0000 mL | Freq: Four times a day (QID) | ORAL | Status: DC | PRN

## 2020-11-27 MED ORDER — ASPIRIN EC 81 MG PO TBEC
81.0000 mg | DELAYED_RELEASE_TABLET | Freq: Every day | ORAL | Status: DC
  Administered 2020-11-27 – 2020-11-28 (×2): 81 mg via ORAL
  Filled 2020-11-27 (×2): qty 1

## 2020-11-27 MED ORDER — PHENYLEPHRINE 40 MCG/ML (10ML) SYRINGE FOR IV PUSH (FOR BLOOD PRESSURE SUPPORT)
PREFILLED_SYRINGE | INTRAVENOUS | Status: DC | PRN
  Administered 2020-11-27: 80 ug via INTRAVENOUS

## 2020-11-27 MED ORDER — ONDANSETRON HCL 4 MG/2ML IJ SOLN: 4.0000 mg | Freq: Four times a day (QID) | INTRAMUSCULAR | Status: DC | PRN

## 2020-11-27 MED ORDER — ONDANSETRON HCL 4 MG PO TABS: 4.0000 mg | ORAL_TABLET | Freq: Four times a day (QID) | ORAL | Status: DC | PRN

## 2020-11-27 MED ORDER — ACETAMINOPHEN 10 MG/ML IV SOLN: 1000.0000 mg | Freq: Once | INTRAVENOUS | Status: DC | PRN

## 2020-11-27 MED ORDER — AMLODIPINE BESYLATE-VALSARTAN 5-160 MG PO TABS: 1.0000 | ORAL_TABLET | Freq: Every day | ORAL | Status: DC

## 2020-11-27 MED ORDER — BUPIVACAINE LIPOSOME 1.3 % IJ SUSP
INTRAMUSCULAR | Status: DC | PRN
  Administered 2020-11-27: 10 mL

## 2020-11-27 MED ORDER — INSULIN GLARGINE-YFGN 100 UNIT/ML ~~LOC~~ SOLN
15.0000 [IU] | Freq: Every day | SUBCUTANEOUS | Status: DC
  Administered 2020-11-27: 15 [IU] via SUBCUTANEOUS
  Filled 2020-11-27 (×2): qty 0.15

## 2020-11-27 MED ORDER — PHENOL 1.4 % MT LIQD: 1.0000 | OROMUCOSAL | Status: DC | PRN

## 2020-11-27 MED ORDER — DULAGLUTIDE 1.5 MG/0.5ML ~~LOC~~ SOAJ: 1.5000 mg | SUBCUTANEOUS | Status: DC

## 2020-11-27 MED ORDER — INSULIN ASPART 100 UNIT/ML IJ SOLN
2.0000 [IU] | Freq: Once | INTRAMUSCULAR | Status: AC
  Administered 2020-11-27: 2 [IU] via SUBCUTANEOUS

## 2020-11-27 MED ORDER — CHLORHEXIDINE GLUCONATE 0.12 % MT SOLN
OROMUCOSAL | Status: AC
  Administered 2020-11-27: 15 mL via OROMUCOSAL
  Filled 2020-11-27: qty 15

## 2020-11-27 MED ORDER — POLYETHYLENE GLYCOL 3350 17 G PO PACK: 17.0000 g | PACK | Freq: Every day | ORAL | Status: DC | PRN

## 2020-11-27 MED ORDER — METHOCARBAMOL 1000 MG/10ML IJ SOLN
500.0000 mg | Freq: Four times a day (QID) | INTRAVENOUS | Status: DC | PRN
  Filled 2020-11-27: qty 5

## 2020-11-27 MED ORDER — ISOSORBIDE DINITRATE 30 MG PO TABS
30.0000 mg | ORAL_TABLET | Freq: Two times a day (BID) | ORAL | Status: DC
  Administered 2020-11-27 – 2020-11-28 (×2): 30 mg via ORAL
  Filled 2020-11-27 (×4): qty 1

## 2020-11-27 MED ORDER — BUPIVACAINE HCL (PF) 0.5 % IJ SOLN
INTRAMUSCULAR | Status: AC
  Filled 2020-11-27: qty 30

## 2020-11-27 MED ORDER — FENTANYL CITRATE (PF) 250 MCG/5ML IJ SOLN
INTRAMUSCULAR | Status: AC
  Filled 2020-11-27: qty 5

## 2020-11-27 MED ORDER — SODIUM CHLORIDE 0.9 % IV SOLN: 250.0000 mL | INTRAVENOUS | Status: DC

## 2020-11-27 MED ORDER — DEXAMETHASONE SODIUM PHOSPHATE 10 MG/ML IJ SOLN
INTRAMUSCULAR | Status: AC
  Filled 2020-11-27: qty 1

## 2020-11-27 MED ORDER — METOPROLOL SUCCINATE ER 50 MG PO TB24
50.0000 mg | ORAL_TABLET | Freq: Every day | ORAL | Status: DC
  Administered 2020-11-27 – 2020-11-28 (×2): 50 mg via ORAL
  Filled 2020-11-27 (×2): qty 1

## 2020-11-27 MED ORDER — ACETAMINOPHEN 325 MG PO TABS: 650.0000 mg | ORAL_TABLET | ORAL | Status: DC | PRN

## 2020-11-27 MED ORDER — SODIUM CHLORIDE 0.9 % IV SOLN: INTRAVENOUS | Status: DC

## 2020-11-27 MED ORDER — HYDROCODONE-ACETAMINOPHEN 7.5-325 MG PO TABS
1.0000 | ORAL_TABLET | Freq: Four times a day (QID) | ORAL | Status: DC
Start: 2020-11-27 — End: 2020-11-28
  Administered 2020-11-27 – 2020-11-28 (×3): 1 via ORAL
  Filled 2020-11-27 (×3): qty 1

## 2020-11-27 MED ORDER — TRAZODONE HCL 50 MG PO TABS: 50.0000 mg | ORAL_TABLET | Freq: Every evening | ORAL | Status: DC | PRN

## 2020-11-27 MED ORDER — ACETAMINOPHEN 650 MG RE SUPP: 650.0000 mg | RECTAL | Status: DC | PRN

## 2020-11-27 MED ORDER — HYDROMORPHONE HCL 1 MG/ML IJ SOLN
0.2500 mg | INTRAMUSCULAR | Status: DC | PRN
  Administered 2020-11-27: .5 mg via INTRAVENOUS

## 2020-11-27 MED ORDER — ACETAMINOPHEN 10 MG/ML IV SOLN
INTRAVENOUS | Status: AC
  Administered 2020-11-27: 1000 mg via INTRAVENOUS
  Filled 2020-11-27: qty 100

## 2020-11-27 MED ORDER — ACETAMINOPHEN 500 MG PO TABS
1000.0000 mg | ORAL_TABLET | Freq: Once | ORAL | Status: AC
  Administered 2020-11-27: 1000 mg via ORAL
  Filled 2020-11-27: qty 2

## 2020-11-27 MED ORDER — ROCURONIUM BROMIDE 10 MG/ML (PF) SYRINGE
PREFILLED_SYRINGE | INTRAVENOUS | Status: AC
  Filled 2020-11-27: qty 30

## 2020-11-27 MED ORDER — MORPHINE SULFATE (PF) 2 MG/ML IV SOLN: 1.0000 mg | INTRAVENOUS | Status: DC | PRN

## 2020-11-27 MED ORDER — MENTHOL 3 MG MT LOZG: 1.0000 | LOZENGE | OROMUCOSAL | Status: DC | PRN

## 2020-11-27 MED ORDER — FENTANYL CITRATE (PF) 250 MCG/5ML IJ SOLN
INTRAMUSCULAR | Status: DC | PRN
  Administered 2020-11-27 (×3): 50 ug via INTRAVENOUS
  Administered 2020-11-27: 100 ug via INTRAVENOUS
  Administered 2020-11-27 (×5): 50 ug via INTRAVENOUS

## 2020-11-27 MED ORDER — PANTOPRAZOLE SODIUM 40 MG IV SOLR
40.0000 mg | Freq: Every day | INTRAVENOUS | Status: DC
  Administered 2020-11-27: 40 mg via INTRAVENOUS
  Filled 2020-11-27: qty 40

## 2020-11-27 MED ORDER — LIDOCAINE 2% (20 MG/ML) 5 ML SYRINGE
INTRAMUSCULAR | Status: DC | PRN
  Administered 2020-11-27: 100 mg via INTRAVENOUS

## 2020-11-27 MED ORDER — CEFAZOLIN SODIUM-DEXTROSE 2-4 GM/100ML-% IV SOLN
2.0000 g | Freq: Three times a day (TID) | INTRAVENOUS | Status: AC
  Administered 2020-11-27 (×2): 2 g via INTRAVENOUS
  Filled 2020-11-27 (×2): qty 100

## 2020-11-27 MED ORDER — BUPIVACAINE HCL 0.5 % IJ SOLN
INTRAMUSCULAR | Status: DC | PRN
  Administered 2020-11-27: 10 mL

## 2020-11-27 MED ORDER — MIDAZOLAM HCL 2 MG/2ML IJ SOLN
INTRAMUSCULAR | Status: AC
  Filled 2020-11-27: qty 2

## 2020-11-27 MED ORDER — LOSARTAN POTASSIUM 50 MG PO TABS: 25.0000 mg | ORAL_TABLET | Freq: Every day | ORAL | Status: DC

## 2020-11-27 MED ORDER — ALBUMIN HUMAN 5 % IV SOLN
INTRAVENOUS | Status: DC | PRN
Start: 2020-11-27 — End: 2020-11-27

## 2020-11-27 MED ORDER — PHENYLEPHRINE HCL-NACL 20-0.9 MG/250ML-% IV SOLN
INTRAVENOUS | Status: DC | PRN
  Administered 2020-11-27: 15 ug/min via INTRAVENOUS

## 2020-11-27 MED ORDER — BUPIVACAINE LIPOSOME 1.3 % IJ SUSP
INTRAMUSCULAR | Status: AC
  Filled 2020-11-27: qty 20

## 2020-11-27 MED ORDER — DEXAMETHASONE SODIUM PHOSPHATE 10 MG/ML IJ SOLN
INTRAMUSCULAR | Status: DC | PRN
  Administered 2020-11-27: 5 mg via INTRAVENOUS

## 2020-11-27 MED ORDER — PROPOFOL 10 MG/ML IV BOLUS
INTRAVENOUS | Status: AC
  Filled 2020-11-27: qty 40

## 2020-11-27 MED ORDER — ROSUVASTATIN CALCIUM 20 MG PO TABS
40.0000 mg | ORAL_TABLET | Freq: Every day | ORAL | Status: DC
  Administered 2020-11-27 – 2020-11-28 (×2): 40 mg via ORAL
  Filled 2020-11-27 (×2): qty 2

## 2020-11-27 MED ORDER — INSULIN ASPART 100 UNIT/ML IJ SOLN
0.0000 [IU] | Freq: Three times a day (TID) | INTRAMUSCULAR | Status: DC
  Administered 2020-11-27: 3 [IU] via SUBCUTANEOUS
  Administered 2020-11-28: 2 [IU] via SUBCUTANEOUS

## 2020-11-27 MED ORDER — MIDAZOLAM HCL 5 MG/5ML IJ SOLN
INTRAMUSCULAR | Status: DC | PRN
  Administered 2020-11-27: 2 mg via INTRAVENOUS

## 2020-11-27 MED ORDER — CELECOXIB 200 MG PO CAPS
200.0000 mg | ORAL_CAPSULE | Freq: Two times a day (BID) | ORAL | Status: DC
  Administered 2020-11-27 – 2020-11-28 (×3): 200 mg via ORAL
  Filled 2020-11-27 (×3): qty 1

## 2020-11-27 MED ORDER — GABAPENTIN 300 MG PO CAPS
300.0000 mg | ORAL_CAPSULE | Freq: Three times a day (TID) | ORAL | Status: DC
  Administered 2020-11-27: 300 mg via ORAL
  Filled 2020-11-27 (×3): qty 1

## 2020-11-27 MED ORDER — INSULIN ASPART 100 UNIT/ML IJ SOLN
INTRAMUSCULAR | Status: AC
  Administered 2020-11-27: 2 [IU] via SUBCUTANEOUS
  Filled 2020-11-27: qty 1

## 2020-11-27 MED ORDER — CEFAZOLIN IN SODIUM CHLORIDE 3-0.9 GM/100ML-% IV SOLN
3.0000 g | INTRAVENOUS | Status: AC
  Administered 2020-11-27: 3 g via INTRAVENOUS
  Filled 2020-11-27: qty 100

## 2020-11-27 SURGICAL SUPPLY — 71 items
BAG COUNTER SPONGE SURGICOUNT (BAG) ×2 IMPLANT
BLADE CLIPPER SURG (BLADE) IMPLANT
BONE VIVIGEN FORMABLE 5.4CC (Bone Implant) ×2 IMPLANT
BUR MATCHSTICK NEURO 3.0 LAGG (BURR) ×2 IMPLANT
BUR SABER RD CUTTING 3.0 (BURR) IMPLANT
CAGE LORD X-PAC LT 12 X 28 (Cage) ×2 IMPLANT
COVER BACK TABLE 80X110 HD (DRAPES) ×2 IMPLANT
COVER SURGICAL LIGHT HANDLE (MISCELLANEOUS) ×2 IMPLANT
DECANTER SPIKE VIAL GLASS SM (MISCELLANEOUS) ×2 IMPLANT
DERMABOND ADVANCED (GAUZE/BANDAGES/DRESSINGS)
DERMABOND ADVANCED .7 DNX12 (GAUZE/BANDAGES/DRESSINGS) IMPLANT
DRAPE C-ARM 42X72 X-RAY (DRAPES) ×2 IMPLANT
DRAPE C-ARMOR (DRAPES) ×2 IMPLANT
DRAPE MICROSCOPE LEICA (MISCELLANEOUS) ×2 IMPLANT
DRAPE POUCH INSTRU U-SHP 10X18 (DRAPES) ×2 IMPLANT
DRAPE SURG 17X23 STRL (DRAPES) ×8 IMPLANT
DRSG MEPILEX BORDER 4X4 (GAUZE/BANDAGES/DRESSINGS) IMPLANT
DRSG MEPILEX BORDER 4X8 (GAUZE/BANDAGES/DRESSINGS) ×2 IMPLANT
DURAPREP 26ML APPLICATOR (WOUND CARE) ×2 IMPLANT
ELECT BLADE 4.0 EZ CLEAN MEGAD (MISCELLANEOUS) ×2
ELECT BLADE 6.5 EXT (BLADE) IMPLANT
ELECT CAUTERY BLADE 6.4 (BLADE) ×2 IMPLANT
ELECT REM PT RETURN 9FT ADLT (ELECTROSURGICAL) ×2
ELECTRODE BLDE 4.0 EZ CLN MEGD (MISCELLANEOUS) ×1 IMPLANT
ELECTRODE REM PT RTRN 9FT ADLT (ELECTROSURGICAL) ×1 IMPLANT
EVACUATOR 1/8 PVC DRAIN (DRAIN) IMPLANT
GAUZE SPONGE 4X4 12PLY STRL (GAUZE/BANDAGES/DRESSINGS) IMPLANT
GLOVE SRG 8 PF TXTR STRL LF DI (GLOVE) ×1 IMPLANT
GLOVE SURG 8.5 LATEX PF (GLOVE) ×2 IMPLANT
GLOVE SURG LTX SZ9 (GLOVE) ×2 IMPLANT
GLOVE SURG ORTHO LTX SZ7.5 (GLOVE) ×2 IMPLANT
GLOVE SURG UNDER POLY LF SZ8 (GLOVE) ×2
GOWN STRL REUS W/ TWL LRG LVL3 (GOWN DISPOSABLE) ×1 IMPLANT
GOWN STRL REUS W/TWL 2XL LVL3 (GOWN DISPOSABLE) ×4 IMPLANT
GOWN STRL REUS W/TWL LRG LVL3 (GOWN DISPOSABLE) ×2
KIT BASIN OR (CUSTOM PROCEDURE TRAY) ×2 IMPLANT
KIT POSITION SURG JACKSON T1 (MISCELLANEOUS) ×2 IMPLANT
KIT TURNOVER KIT B (KITS) ×2 IMPLANT
NEEDLE 22X1 1/2 (OR ONLY) (NEEDLE) ×2 IMPLANT
NEEDLE SPNL 18GX3.5 QUINCKE PK (NEEDLE) ×2 IMPLANT
NS IRRIG 1000ML POUR BTL (IV SOLUTION) ×2 IMPLANT
PACK LAMINECTOMY ORTHO (CUSTOM PROCEDURE TRAY) ×2 IMPLANT
PAD ARMBOARD 7.5X6 YLW CONV (MISCELLANEOUS) ×4 IMPLANT
PATTIES SURGICAL .75X.75 (GAUZE/BANDAGES/DRESSINGS) ×2 IMPLANT
PATTIES SURGICAL 1X1 (DISPOSABLE) IMPLANT
ROD PRE BENT EXPEDIUM 35MM (Rod) ×2 IMPLANT
ROD PRE LORDOSED 5.5X45 (Rod) ×4 IMPLANT
SCREW CORT FIX FEN 5.5X6X45MM (Screw) ×8 IMPLANT
SCREW SET SINGLE INNER (Screw) ×10 IMPLANT
SCREW VIPER 7X45MM (Screw) ×2 IMPLANT
SPOGE SURGIFLO 8M (HEMOSTASIS)
SPONGE SURGIFLO 8M (HEMOSTASIS) IMPLANT
SPONGE SURGIFOAM ABS GEL 100 (HEMOSTASIS) ×2 IMPLANT
SUT VIC AB 0 CT1 27 (SUTURE) ×2
SUT VIC AB 0 CT1 27XBRD ANBCTR (SUTURE) ×1 IMPLANT
SUT VIC AB 1 CTX 36 (SUTURE) ×4
SUT VIC AB 1 CTX36XBRD ANBCTR (SUTURE) ×2 IMPLANT
SUT VIC AB 2-0 CT1 27 (SUTURE) ×2
SUT VIC AB 2-0 CT1 TAPERPNT 27 (SUTURE) ×1 IMPLANT
SUT VIC AB 3-0 X1 27 (SUTURE) IMPLANT
SYR 20ML LL LF (SYRINGE) ×2 IMPLANT
SYR CONTROL 10ML LL (SYRINGE) ×4 IMPLANT
TAP CANN VIPER2 DL 5.0 (TAP) ×2 IMPLANT
TAP CANN VIPER2 DL 6.0 (TAP) ×2 IMPLANT
TAP CANN VIPER2 DL 7.0 (TAP) ×2 IMPLANT
TAP VIPER MIS 4.35MM (TAP) ×2 IMPLANT
TOWEL GREEN STERILE (TOWEL DISPOSABLE) ×2 IMPLANT
TOWEL GREEN STERILE FF (TOWEL DISPOSABLE) ×2 IMPLANT
TRAY FOLEY MTR SLVR 16FR STAT (SET/KITS/TRAYS/PACK) ×2 IMPLANT
WATER STERILE IRR 1000ML POUR (IV SOLUTION) ×2 IMPLANT
YANKAUER SUCT BULB TIP NO VENT (SUCTIONS) ×2 IMPLANT

## 2020-11-27 NOTE — Progress Notes (Signed)
Orthopedic Tech Progress Note Patient Details:  [name] 10/24/1960 [id number] PACU RN called requesting a LSO BRACE  Ortho Devices Type of Ortho Device: Lumbar corsett Ortho Device/Splint Location: BACK Ortho Device/Splint Interventions: Ordered   Post Interventions Patient Tolerated: Well Instructions Provided: [facility] [place] 11/27/2020, 2:04 PM

## 2020-11-27 NOTE — H&P (Signed)
[name] is an 60 y.o. female.   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  States that symptoms unchanged in previous visit.  She is wanting to proceed with L4-5 TLIF as scheduled.  Today history and physical performed.  Review of systems positive for sleep apnea with CPAP use.  Past Medical History:  Diagnosis Date   ALLERGIC RHINITIS    Allergy to IVP dye    Coronary artery disease    Diabetes mellitus, type 2 ([place])    History of tobacco abuse    Quit 2011   Hypertension    Hypertriglyceridemia    Hypothyroidism    NSTEMI (non-ST elevated myocardial infarction) ([place]) 09/30/2011   NO CAD by cath, ? coronary vasospasm   Obesity    Sleep apnea     Past Surgical History:  Procedure Laterality Date   CORONARY STENT INTERVENTION N/A 08/19/2017   Procedure: CORONARY STENT INTERVENTION;  Surgeon: [name], MD;  Location: [facility];  Service: Cardiovascular;  Laterality: N/A;   LEFT HEART CATH AND CORONARY ANGIOGRAPHY N/A 08/19/2017   Procedure: LEFT HEART CATH AND CORONARY ANGIOGRAPHY;  Surgeon: [name], MD;  Location: [facility];  Service: Cardiovascular;  Laterality: N/A;   LEFT HEART CATHETERIZATION WITH CORONARY ANGIOGRAM N/A 09/30/2011   Procedure: LEFT HEART CATHETERIZATION WITH CORONARY ANGIOGRAM;  Surgeon: Burnell Blanks, MD;  Location: [facility];  Service: Cardiovascular;  Laterality: N/A;   TUBAL LIGATION     VESICOVAGINAL FISTULA CLOSURE W/ TAH      Family History  Problem Relation Age of Onset   Heart disease Mother        has pacemaker   Diabetes Mother    Diabetes Other        strong family history   Cancer Maternal Uncle    Hypertension Brother    Diabetes Brother    Hypertension Brother    Social History:  reports that she quit smoking about 9 years ago. Her smoking use included cigarettes. She has a 28.80 pack-year  smoking history. She has never used smokeless tobacco. She reports current alcohol use. She reports that she does not use drugs.  Allergies:  Allergies  Allergen Reactions   Contrast Media [Iodinated Diagnostic Agents] Swelling    Skin Peeling and break out   Codeine Itching   Other     NO PLASTIC TAPE - Blisters and skin removal    Medications Prior to Admission  Medication Sig Dispense Refill   amLODipine-valsartan (EXFORGE) 5-160 MG tablet Take 1 tablet by mouth daily.     aspirin EC 81 MG tablet Take 81 mg by mouth daily.     HYDROcodone-acetaminophen (NORCO/VICODIN) 5-325 MG tablet Take 1 tablet by mouth every 6 (six) hours as needed for moderate pain. (Patient taking differently: Take 1 tablet by mouth every 6 (six) hours as needed for severe pain.) 30 tablet 0   isosorbide dinitrate (ISORDIL) 30 MG tablet Take 30 mg by mouth 2 (two) times daily.     LANTUS SOLOSTAR 100 UNIT/ML Solostar Pen Inject 15 Units into the skin at bedtime.     levothyroxine (SYNTHROID) 150 MCG tablet Take 150 mcg by mouth daily before breakfast.     metoprolol succinate (TOPROL-XL) 50 MG 24 hr tablet Take 50 mg by mouth daily.     MITIGARE 0.6 MG CAPS Take 0.6 mg by mouth daily.  NOVOLOG FLEXPEN 100 UNIT/ML FlexPen Inject 15-18 Units into the skin 3 (three) times daily with meals.     rosuvastatin (CRESTOR) 40 MG tablet Take 40 mg by mouth daily.     traZODone (DESYREL) 50 MG tablet Take 50 mg by mouth at bedtime as needed for sleep.     TRULICITY 1.5 OH/6.0OV SOPN Inject 1.5 mg into the skin every Friday.     losartan (COZAAR) 25 MG tablet TAKE 1 TABLET(25 MG) BY MOUTH EVERY EVENING FOR 90 DOSES 90 tablet 0   nitroGLYCERIN (NITROSTAT) 0.4 MG SL tablet Place 1 tablet (0.4 mg total) under the tongue every 5 (five) minutes as needed for chest pain (up to 3 doses). 25 tablet 3    Results for orders placed or performed during the hospital encounter of 11/27/20 (from the past 48 hour(s))  Glucose,  capillary     Status: Abnormal   Collection Time: 11/27/20  5:45 AM  Result Value Ref Range   Glucose-Capillary 165 (H) 70 - 99 mg/dL    Comment: Glucose reference range applies only to samples taken after fasting for at least 8 hours.   Comment 1 Notify RN   Urinalysis, Routine w reflex microscopic     Status: None   Collection Time: 11/27/20  5:58 AM  Result Value Ref Range   Color, Urine YELLOW YELLOW   APPearance CLEAR CLEAR   Specific Gravity, Urine 1.020 1.005 - 1.030   pH 5.0 5.0 - 8.0   Glucose, UA NEGATIVE NEGATIVE mg/dL   Hgb urine dipstick NEGATIVE NEGATIVE   Bilirubin Urine NEGATIVE NEGATIVE   Ketones, ur NEGATIVE NEGATIVE mg/dL   Protein, ur NEGATIVE NEGATIVE mg/dL   Nitrite NEGATIVE NEGATIVE   Leukocytes,Ua NEGATIVE NEGATIVE    Comment: Performed at [facility] [address] [place], [place] [address]   No results found.  Review of Systems  Constitutional:  Positive for activity change.  HENT: Negative.    Respiratory:  Positive for apnea (sleep apnea and uses CPAP).   Cardiovascular: Negative.   Gastrointestinal: Negative.   Musculoskeletal:  Positive for back pain and gait problem.  Neurological:  Positive for numbness.  Psychiatric/Behavioral: Negative.     Blood pressure 127/63, pulse 72, temperature 98.2 F (36.8 C), temperature source Oral, resp. rate 17, height 5\' 8"  (1.727 m), weight 132.5 kg, SpO2 97 %. Physical Exam HENT:     Head: Normocephalic.     Nose: Nose normal.  Eyes:     Extraocular Movements: Extraocular movements intact.  Cardiovascular:     Rate and Rhythm: Regular rhythm.     Heart sounds: Normal heart sounds.  Pulmonary:     Effort: No respiratory distress.  Abdominal:     Tenderness: There is no abdominal tenderness.  Musculoskeletal:        General: Tenderness present.     Cervical back: Normal range of motion.  Neurological:     Mental Status: She is alert and oriented to person, place, and time.  Psychiatric:         Mood and Affect: Mood normal.     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  All questions answered.   Benjiman Core, PA-C 11/27/2020, 6:58 AM

## 2020-11-27 NOTE — Anesthesia Postprocedure Evaluation (Signed)
Anesthesia Post Note  Patient: [name]  Procedure(s) Performed: RIGHT LUMBAR FOUR-FIVE TRANSFORAMINAL LUMBAR INTEBODY FUSION WITH PEDICLE SCREWS, RODS AND CAGE, LOCAL AND ALLOGRAFT BONE GRAFT, VIVIGEN (Back)     Patient location during evaluation: PACU Anesthesia Type: General Level of consciousness: awake and alert and oriented Pain management: pain level controlled Vital Signs Assessment: post-procedure vital signs reviewed and stable Respiratory status: spontaneous breathing, nonlabored ventilation and respiratory function stable Cardiovascular status: blood pressure returned to baseline Postop Assessment: no apparent nausea or vomiting Anesthetic complications: no   No notable events documented.  Last Vitals:  Vitals:   11/27/20 1300 11/27/20 1313  BP: (!) 148/67 (!) 142/65  Pulse: 75 75  Resp: (!) 21 (!) 22  Temp:    SpO2: 92% 91%    Last Pain:  Vitals:   11/27/20 1313  TempSrc:   PainSc: Asleep                 Marthenia Rolling

## 2020-11-27 NOTE — Discharge Instructions (Addendum)

## 2020-11-27 NOTE — Interval H&P Note (Signed)
History and Physical Interval Note:  11/27/2020 7:34 AM  [name]  has presented today for surgery, with the diagnosis of lumbar spinal stenosis with spondylolisthesis L4-5.  The various methods of treatment have been discussed with the patient and family. After consideration of risks, benefits and other options for treatment, the patient has consented to  Procedure(s): RIGHT L4-5 TRANSFORAMINAL LUMBAR INTEBODY FUSION WITH PEDICLE SCREWS, RODS AND CAGE, LOCAL AND ALLOGRAFT BONE GRAFT, VIVIGEN (N/A) as a surgical intervention.  The patient's history has been reviewed, patient examined, no change in status, stable for surgery.  I have reviewed the patient's chart and labs.  Questions were answered to the patient's satisfaction.     Basil Dess

## 2020-11-27 NOTE — Transfer of Care (Signed)
Immediate Anesthesia Transfer of Care Note  Patient: [name]  Procedure(s) Performed: RIGHT LUMBAR FOUR-FIVE TRANSFORAMINAL LUMBAR INTEBODY FUSION WITH PEDICLE SCREWS, RODS AND CAGE, LOCAL AND ALLOGRAFT BONE GRAFT, VIVIGEN (Back)  Patient Location: PACU  Anesthesia Type:General  Level of Consciousness: drowsy  Airway & Oxygen Therapy: Patient Spontanous Breathing and Patient connected to nasal cannula oxygen  Post-op Assessment: Report given to RN and Post -op Vital signs reviewed and stable  Post vital signs: Reviewed and stable  Last Vitals:  Vitals Value Taken Time  BP 129/58 11/27/20 1213  Temp    Pulse 90 11/27/20 1215  Resp 19 11/27/20 1215  SpO2 92 % 11/27/20 1215  Vitals shown include unvalidated device data.  Last Pain:  Vitals:   11/27/20 0613  TempSrc:   PainSc: 3       Patients Stated Pain Goal: 0 (91/50/41 3643)  Complications: No notable events documented.

## 2020-11-27 NOTE — Op Note (Signed)
11/27/2020  12:24 PM  PATIENT:  [name]  60 y.o. female  MRN: [id number]  OPERATIVE REPORT  PRE-OPERATIVE DIAGNOSIS:  lumbar spinal stenosis with spondylolisthesis L4-5  POST-OPERATIVE DIAGNOSIS:  lumbar spinal stenosis with spondylolisthesis L4-5  PROCEDURE:  Procedure(s): RIGHT LUMBAR FOUR-FIVE TRANSFORAMINAL LUMBAR INTEBODY FUSION WITH PEDICLE SCREWS, RODS AND CAGE, LOCAL AND ALLOGRAFT BONE GRAFT, VIVIGEN    SURGEON:  Jessy Oto, MD     ASSISTANT: Benjiman Core, PA-C  (Present throughout the entire procedure and necessary for completion of procedure in a timely manner)     ANESTHESIA:  General,    COMPLICATIONS:  None.     COMPONENTS:   Implant Name Type Inv. Item Serial No. Manufacturer Lot No. LRB No. Used Action  BONE VIVIGEN FORMABLE 5.4CC - [id number] Bone Implant BONE VIVIGEN FORMABLE 5.4CC [id number] [facility]  N/A 1 Implanted  SCREW VIPER 7X45MM - [id number] Screw SCREW VIPER 7X45MM  [employer]  N/A 1 Implanted  SCREW CORT FIX FEN 5.5X6X45MM - [id number] Screw SCREW CORT FIX FEN 5.5X6X45MM  [employer]  N/A 4 Implanted  ROD PRE BENT EXPEDIUM 35MM - [id number] Rod ROD PRE BENT EXPEDIUM 35MM  [employer]  N/A 1 Implanted and Explanted  ROD PRE LORDOSED 5.5X45 - [id number] Rod ROD PRE LORDOSED 5.5X45  [employer]  N/A 2 Implanted  SCREW SET SINGLE INNER - [id number] Screw SCREW SET SINGLE INNER  [employer]  N/A 1 Implanted and Explanted  SCREW SET SINGLE INNER - [id number] Screw SCREW SET SINGLE INNER  [employer]  N/A 4 Implanted  CAGE LORD X-PAC LT 12 X 28 - [id number] Cage CAGE LORD X-PAC LT 12 X 28  [employer]  N/A 1 Implanted    PROCEDURE:The patient was met in the holding area, and the appropriate lumbar level Right L4-5  identified and marked with an x and my initials.The patient was then transported to OR. The patient was then placed under general  anesthesia without difficulty.The patient received appropriate preoperative antibiotic prophylaxis ancef.  Nursing staff inserted a Foley catheter under sterile conditions. She was then turned to a prone position [facility] spine table was used for this case. All pressure points were well padded PAS stocking applied bilateral lower extremity to prevent DVT. Standard prep DuraPrep solution. Draped in the usual manner. Time-out procedure was called and correct .   The incision was at L4-5 and determined using C-arm to mark the inferior L5 pedicles and  an additionally extended up to the L3 spinous process.   Bovie electric cautery was used to control bleeding and carefully dissection was carried down along the lateral aspects of the spinous process of L3 to L5. Cobb then used to carefully elevate the paralumbar muscle and the incision in the midline was carried to to the level of the base of the residual spinous processes. The posterior exposure area extending from the base of the spinous process of L3 to the superior aspect of L5 was carried expose at its edges debrided the muscle attachments using a Leskell. Time-out procedure was called and correct. Skin in the midline between L2 and L5 was then infiltrated with local anesthesia, marcaine 1/2% 1:1 exparel 1.3% total 20 cc used. Incision was then made  extending from L2-L5  through the skin and subcutaneous layers down to the patient's lumbodorsal fascia and spinous processes. The incision then carried sharply excising the supraspinous ligament and then  continuing the lateral aspect of the spinous processes of L3,L4 and L5. Cobb elevator used to carefully elevate the paralumbar muscles off of the posterior elements using electrocautery carefully drilled bleeding and perform dissection of the muscle tissues of the preserving the facet capsule at the L3-4. Continuing the exposure out laterally to expose the lateral margin of the facet joint line at L4-5 and L3-4.  Incision was carried in the midline down to the L5 level area bleeders controlled using electrocautery monopolar electrocautery.    C-arm fluoroscopy was then brought into the field and using C-arm fluoroscopy then a hole made into the medial aspect of the pedicle of left L4 using a high speed burr observed in the pedicle using C arm at the 5 oclock position on the left L4 pedicle nerve probe initial entry was determined on fluoroscopy to be good position alignment so that a 4.33mm tap was passed to 30 mm within the left L4 pedicle to a depth of nearly 45 mm observed on C-arm fluoroscopy to be beyond the midpoint of the lumbar vertebra and then position alignment within the left L4 pedicle this was then removed and the pedicle channel probed demonstrating patency no sign of rupture the cortex of the pedicle. Tapping with a 4.35 mm screw tap then 5 mm tap then a 6 mm and 52mm tap a 7.0 mm x 45 mm screw was placed but while placing the tip of the insertion screw driver sheared and the screw required removal and replacement with a 6.0 mm x 45 mm mPACT screw with excellent purchase. C-arm fluoroscopy was then brought into the field and using C-arm fluoroscopy then a hole made into the posterior medial aspect of the pedicle of right L4 observed in the pedicle using ball tipped nerve hook and hockey stick nerve probe initial entry was determined on fluoroscopy to be good position alignment so that 4.89mm tap was then used to tap the right L4 pedicle to a depth of nearly 45 mm observed on C-arm fluoroscopy to be beyond the midpoint of the lumbar vertebra and then position alignment within the right L4 pedicle this was then removed and the pedicle channel probed demonstrating patency no sign of rupture the cortex of the pedicle. Tapping with a 5 mm screw tap then tapping with a 6.0 mm , a 6.0 mm x 45 mm screw was placed on the table for later placement on the right side pedicle at the L4 level. .C-arm fluoroscopy was then  brought into the field and using C-arm fluoroscopy then a hole made into the posterior and medial aspect of the left pedicle of L5 observed in the pedicle using ball tipped nerve hook and hockey stick nerve probe initial entry was determined on fluoroscopy to be good position alignment so that a 4.35 mm tap was then used to tap the left L4 pedicle to a depth of nearly 45 mm observed on C-arm fluoroscopy to be beyond the posterior one third of the lumbar vertebra and good position alignment within the left L5 pedicle this was then removed and the pedicle channel probed demonstrating patency no sign of rupture the cortex of the pedicle. Tapping with a 4.0 mm screw tap then a 5.0 mm tap then tapping with a 6.0 mm a 6.65mm x 45 mm screw was placed on the left side at the L5 pedicle level.  The pedicle channel of L5 on the left probed demonstrating patency no sign of rupture the cortex of the pedicle.C-arm fluoroscopy was  then brought into the field and using C-arm fluoroscopy then a hole made into the posterior and medial aspect of the right pedicle of L5 observed in the pedicle using ball tipped nerve hook and hockey stick nerve probe initial entry was determined on fluoroscopy to be good position alignment so that a 4.514m tap was then used to tap the right L5 pedicle to a depth of nearly 45 mm observed on C-arm fluoroscopy to be beyond the posterior one third of the lumbar vertebra and good position alignment within the right L5 pedicle this was then removed and the pedicle channel probed demonstrating patency no sign of rupture the cortex of the pedicle. Tapping with a 4.32mscrew tap then up to a 14m22map then 6.0mm69m45 mm screw was saved for later placement on the right side at the L5 level.The pedicle channel of L5 on the right probed demonstrating patency no sign of rupture the cortex of the pedicle. Viper screw for fixation of this level was measured as 6.0 mm x 45 mm screw inserted.  Spinous processes of  L4  inferior 50%and superior 20% of L5 were then resected down to the base the lamina at each segment.  Leksell rongeur used to resect inferior aspect of the lamina on the right side at the L4 level and partially on the left side at L4 The left medial 40% of the facets of L4-5 were resected in order to decompress the left and right side of the lumbar thecal sac at L4-5 and decompress the bilateral  L4 and L5 neuroforamen. Osteotomes and 2mm 40m 3mm k27misons were used for this portion of the decompression. Similarly the left side decompression was carried out but  Near complete facetectomy was perform on the right at L4-5 to provide for exposure of the right side L4-5 neuroforamen for ease of placement of TLIF (transforaminal lumbar interbody fusion) at the L4 level inferior portions of the lamina and pars were also resected first beginning with the Leksell rongeur and osteotomes and then resecting using 2 and 3 mm Kerrison. Continued laminectomy was carried out resecting the central portions of the lamina of L4 and upper L5 performing foraminotomies on the right side at the L4 and L5 levels. The inferior articular process  L4 was resected on the right side.  A large amount of hypertrophic ligmentum flavum was found impressing on the right lateral recesses at L4-5 and narrowing the respective L4 and L5 neuroforamen.  Loupe magnification and headlight were used during this portion procedure. Then the operating room microscope sterilely draped and brought into the field.  Attention then turned to placement of the transforaminal lumbar interbody fusion cage.  Bleeding controlled using bipolar electrocautery thrombin soaked gel cottonoids. Then turned to the right L4-5 level the exposure the posterior lateral aspect this was carried out using a Penfield 4 bipolar electrocautery to control small bleeders present. Derricho retractor used to retract the thecal sac and L4 nerve root a 15 blade scalpel was used to incise  posterior lateral aspect of the right L4-5 disc the disc space at this level showed a rather severe narrowing posteriorly was more open anteriorly so that an osteotome again was used to resect a small portion the posterior superior lip of the vertebral body at L5  in order to gain ease of access into the L4-5 disc space. The space was debrided of degenerative disc material using pituitary along root the entire disc space was then debrided of degenerative disc material using  pituitary rongeurs curettage down to bleeding bone endplates. 21m, 8 mm, 955m 1054mnd 34m68mavers were used to debride the disc space and pituitary ronguers used to remove the loosened debris. This space was then carefully assess using spacers  a 10.0mm 12mal cage provided the best fit, the Depuy XPAC cage 10mm 65mmmwa57mosen so that the permanent 10mm x 45m adj49mble XPAC cage packed with local bone graft and vivigen and cancellous allograft chips were placed into the intervertebral disc space. The posterior intervertebral disc space was then packed with autogenous local bone graft that been harvested from the central laminectomy and vivigen bone graft, allograft. Bleeding controlled using bipolar electrocautery.  Observed on C-arm fluoroscopy to be in good position alignment. The cage at L4-5 was placed anteriorly as best as possible the correct patient's lordosis. The cage was then raised with the inserted screw driver and the cage adjustment mechanism deployed. The cage was then filled with vivigen bone graft. With this then the transforaminal lumbar interbody fusion portion of the case was completed bleeders were controlled using bipolar electrocautery thrombin-soaked Gelfoam were appropriate.Decortication of the right facet joint carried out at L4-5. These were packed with cancellous local bone graft.  The 2 viper corticofixation screws on the right were each placed and then each fastener carefully aligned  to allow for placement of  rods. The left rod was a precontoured 45 mm rod. This was then placed into the pedicle screws on the left extending from L4-L5 each of the caps were carefully placed loosely tightened.Caps onto the left L4 fastener was tightened to 80 foot lbs. Across the right side a precontoured 45mm tita63m rod was placed into the L4 and L5  screw fasteners and the upper cap tightened to 80 foot lbs., compression was obtained on the right side between L5 and L4 compressing between the fasteners and tightening the screw caps 85 pounds.Copious amounts of saline solution this was done throughout the case.  Hockey stick neuroprobe was used to probe the neuroforamen bilateral L4 and L5 these were determined to be well decompressed. Permanent C-arm images were obtained in AP and lateral plane and oblique planes. Remaining local bone graft was then applied along both lateral posterior lateral region extending from right L4 to L5 facet bed.Gelfoam was then removed spinal canal. The lumbodorsal musculature carefully exam debrided of any devitalized tissue following removal of self retaining retractors were the bleeders were controlled using electrocautery and the area dorsal lumbar muscle were then approximated in the midline with interrupted #1 Vicryl sutures loose the dorsal fascia was reattached to the spinous process of L3  superiorly and L5  inferiorly this was done with #1 Vicryl sutures. Subcutaneous layers then approximated using interrupted 0 Vicryl sutures and 2-0 Vicryl sutures. Skin was closed with stainless steel staple and MedPlex bandage was applied. All instrument and sponge counts were correct. The patient was then returned to a supine position on her bed reactivated extubated and returned to the recovery room in satisfactory condition.    Mukund Weinreb OwenBenjiman Coreorm the duties of assistant surgeon during this case. He was present from the beginning of the case to the end of the case assisting in transfer the patient from  his stretcher to the OR table and back to the stretcher at the end of the case. Assisted in careful retraction and suction of the laminectomy site delicate neural structures operating under the operating room microscope. He performed closure of the incision from the fascia to the  skin applying the dressing.     ME@ 11/27/2020,12:24 PM

## 2020-11-27 NOTE — Anesthesia Procedure Notes (Signed)
Procedure Name: Intubation Date/Time: 11/27/2020 7:55 AM Performed by: Colin Benton, CRNA Pre-anesthesia Checklist: Patient identified, Emergency Drugs available, Suction available and Patient being monitored Patient Re-evaluated:Patient Re-evaluated prior to induction Oxygen Delivery Method: Circle system utilized Preoxygenation: Pre-oxygenation with 100% oxygen Induction Type: IV induction Ventilation: Mask ventilation without difficulty and Oral airway inserted - appropriate to patient size Laryngoscope Size: Mac and 4 Grade View: Grade II Tube type: Oral Tube size: 7.0 mm Number of attempts: 1 Airway Equipment and Method: Stylet and Oral airway Placement Confirmation: ETT inserted through vocal cords under direct vision, positive ETCO2 and breath sounds checked- equal and bilateral Secured at: 22 cm Tube secured with: Tape Dental Injury: Teeth and Oropharynx as per pre-operative assessment

## 2020-11-27 NOTE — Brief Op Note (Signed)
11/27/2020  12:04 PM  PATIENT:  [name]  60 y.o. female  PRE-OPERATIVE DIAGNOSIS:  lumbar spinal stenosis with spondylolisthesis L4-5  POST-OPERATIVE DIAGNOSIS:  lumbar spinal stenosis with spondylolisthesis L4-5  PROCEDURE:  Procedure(s): RIGHT LUMBAR FOUR-FIVE TRANSFORAMINAL LUMBAR INTEBODY FUSION WITH PEDICLE SCREWS, RODS AND CAGE, LOCAL AND ALLOGRAFT BONE GRAFT, VIVIGEN (N/A)  SURGEON:  Surgeon(s) and Role:   Jessy Oto, MD - Primary  PHYSICIAN ASSISTANT: Benjiman Core, PA-C  ANESTHESIA:   local, general Everlean Cherry CRNA,Dr. Altamese Dilling  EBL:  450 mL   BLOOD ADMINISTERED:none  DRAINS: Urinary Catheter (Foley)   LOCAL MEDICATIONS USED:  MARCAINE 0.5% 1:1 EXPAREL 1.3%   and Amount: 30 ml  SPECIMEN:  No Specimen  DISPOSITION OF SPECIMEN:  N/A  COUNTS:  YES  TOURNIQUET:  * No tourniquets in log *  DICTATION: .Dragon Dictation  PLAN OF CARE: Admit for overnight observation  PATIENT DISPOSITION:  PACU - hemodynamically stable.   Delay start of Pharmacological VTE agent (>24hrs) due to surgical blood loss or risk of bleeding: yes

## 2020-11-28 DIAGNOSIS — Z794 Long term (current) use of insulin: Secondary | ICD-10-CM | POA: Diagnosis not present

## 2020-11-28 DIAGNOSIS — E039 Hypothyroidism, unspecified: Secondary | ICD-10-CM | POA: Diagnosis not present

## 2020-11-28 DIAGNOSIS — Z87891 Personal history of nicotine dependence: Secondary | ICD-10-CM | POA: Diagnosis not present

## 2020-11-28 DIAGNOSIS — I2510 Atherosclerotic heart disease of native coronary artery without angina pectoris: Secondary | ICD-10-CM | POA: Diagnosis not present

## 2020-11-28 DIAGNOSIS — E119 Type 2 diabetes mellitus without complications: Secondary | ICD-10-CM | POA: Diagnosis not present

## 2020-11-28 DIAGNOSIS — M4316 Spondylolisthesis, lumbar region: Secondary | ICD-10-CM | POA: Diagnosis not present

## 2020-11-28 DIAGNOSIS — I10 Essential (primary) hypertension: Secondary | ICD-10-CM | POA: Diagnosis not present

## 2020-11-28 DIAGNOSIS — M48061 Spinal stenosis, lumbar region without neurogenic claudication: Secondary | ICD-10-CM | POA: Diagnosis not present

## 2020-11-28 DIAGNOSIS — Z7984 Long term (current) use of oral hypoglycemic drugs: Secondary | ICD-10-CM | POA: Diagnosis not present

## 2020-11-28 LAB — GLUCOSE, CAPILLARY
Glucose-Capillary: 168 mg/dL — ABNORMAL HIGH (ref 70–99)
Glucose-Capillary: 135 mg/dL — ABNORMAL HIGH (ref 70–99)

## 2020-11-28 LAB — COMPREHENSIVE METABOLIC PANEL
ALT: 26 U/L (ref 0–44)
AST: 21 U/L (ref 15–41)
Albumin: 3.3 g/dL — ABNORMAL LOW (ref 3.5–5.0)
Alkaline Phosphatase: 50 U/L (ref 38–126)
Anion gap: 12 (ref 5–15)
BUN: 18 mg/dL (ref 6–20)
CO2: 20 mmol/L — ABNORMAL LOW (ref 22–32)
Calcium: 8.6 mg/dL — ABNORMAL LOW (ref 8.9–10.3)
Chloride: 106 mmol/L (ref 98–111)
Creatinine, Ser: 1.24 mg/dL — ABNORMAL HIGH (ref 0.44–1.00)
GFR, Estimated: 50 mL/min — ABNORMAL LOW (ref >60)
Glucose, Bld: 180 mg/dL — ABNORMAL HIGH (ref 70–99)
Potassium: 4 mmol/L (ref 3.5–5.1)
Sodium: 138 mmol/L (ref 135–145)
Total Bilirubin: 1.2 mg/dL (ref 0.3–1.2)
Total Protein: 6.5 g/dL (ref 6.5–8.1)

## 2020-11-28 LAB — CBC
HCT: 32.3 % — ABNORMAL LOW (ref 36.0–46.0)
Hemoglobin: 10.2 g/dL — ABNORMAL LOW (ref 12.0–15.0)
MCH: 28.7 pg (ref 26.0–34.0)
MCHC: 31.6 g/dL (ref 30.0–36.0)
MCV: 90.7 fL (ref 80.0–100.0)
Platelets: 286 10*3/uL (ref 150–400)
RBC: 3.56 MIL/uL — ABNORMAL LOW (ref 3.87–5.11)
RDW: 13.7 % (ref 11.5–15.5)
WBC: 14.4 10*3/uL — ABNORMAL HIGH (ref 4.0–10.5)
nRBC: 0 % (ref 0.0–0.2)

## 2020-11-28 MED ORDER — HYDROCODONE-ACETAMINOPHEN 10-325 MG PO TABS: 1.0000 | ORAL_TABLET | ORAL | 0 refills | Status: DC | PRN

## 2020-11-28 MED ORDER — METHOCARBAMOL 500 MG PO TABS: 500.0000 mg | ORAL_TABLET | Freq: Four times a day (QID) | ORAL | 1 refills | Status: DC | PRN

## 2020-11-28 MED ORDER — GABAPENTIN 300 MG PO CAPS: 300.0000 mg | ORAL_CAPSULE | Freq: Two times a day (BID) | ORAL | 1 refills | Status: DC

## 2020-11-28 MED ORDER — PANTOPRAZOLE SODIUM 40 MG PO TBEC: 40.0000 mg | DELAYED_RELEASE_TABLET | Freq: Every day | ORAL | Status: DC

## 2020-11-28 MED FILL — Thrombin (Recombinant) For Soln 20000 Unit: CUTANEOUS | Qty: 1 | Status: AC

## 2020-11-28 NOTE — Progress Notes (Signed)
     Subjective: 1 Day Post-Op Procedure(s) (LRB): RIGHT LUMBAR FOUR-FIVE TRANSFORAMINAL LUMBAR INTEBODY FUSION WITH PEDICLE SCREWS, RODS AND CAGE, LOCAL AND ALLOGRAFT BONE GRAFT, VIVIGEN (N/A) Awake, alert and oriented x 4, PT and OT saw today and they have documented her improved mobility and very good independent status. She has some numbness right leg L4 distribution.  Patient reports pain as moderate.    Objective:   VITALS:  Temp:  [97.5 F (36.4 C)-99 F (37.2 C)] 98.1 F (36.7 C) (10/25 0813) Pulse Rate:  [65-101] 65 (10/25 0813) Resp:  [16-25] 16 (10/25 0813) BP: (109-173)/(47-76) 109/61 (10/25 0813) SpO2:  [91 %-99 %] 99 % (10/25 0813)  Neurologically intact ABD soft Neurovascular intact Sensation intact distally Intact pulses distally Dorsiflexion/Plantar flexion intact Dressing dry and intact.   LABS Recent Labs    11/28/20 0500  HGB 10.2*  WBC 14.4*  PLT 286   Recent Labs    11/28/20 0500  NA 138  K 4.0  CL 106  CO2 20*  BUN 18  CREATININE 1.24*  GLUCOSE 180*   No results for input(s): LABPT, INR in the last 72 hours.   Assessment/Plan: 1 Day Post-Op Procedure(s) (LRB): RIGHT LUMBAR FOUR-FIVE TRANSFORAMINAL LUMBAR INTEBODY FUSION WITH PEDICLE SCREWS, RODS AND CAGE, LOCAL AND ALLOGRAFT BONE GRAFT, VIVIGEN (N/A)  Advance diet Up with therapy D/C IV fluids Discharge home no home health indicated.  Basil Dess 11/28/2020, 11:54 AM Patient ID: [name], female   DOB: May 01, 1960, 60 y.o.   MRN: [id number]

## 2020-11-28 NOTE — Plan of Care (Signed)
Pt doing well. Pt given D/C instructions with verbal understanding. Rx's were sent to the pharmacy by MD. Pt's incision is clean and dry with no sign of infection. Pt's IV was removed prior to D/C. Pt D/C'd home via wheelchair per MD order. Pt is stable @ D/C and has no other needs at this time. Rohn Fritsch, RN  

## 2020-11-28 NOTE — Evaluation (Signed)
Occupational Therapy Evaluation Patient Details Name: [name] MRN: [id number] DOB: February 20, 1960 Today's Date: 11/28/2020   History of Present Illness [name]  60 y.o. female who is now s/p RIGHT LUMBAR FOUR-FIVE TRANSFORAMINAL LUMBAR INTEBODY FUSION WITH PEDICLE SCREWS, RODS AND CAGE, LOCAL AND ALLOGRAFT BONE GRAFT. PMHx: DM, hypertension, hypertension, hypothyroidism   Clinical Impression   Arien was mod I PTA, reports sometimes required daughters assistance with ADLs, used RW. She lives in a 1 level home with 3 STE and great family support. She verbalized understanding of all back precautions and demonstrated good ability to complete ADLs while maintaining precautions. Overall she required min guard for ADLs and supervision for functional mobility. Pt does not have further OT needs. Recommend d/c home with initially supervision with ADLs and mobility.    Recommendations for follow up therapy are one component of a multi-disciplinary discharge planning process, led by the attending physician.  Recommendations may be updated based on patient status, additional functional criteria and insurance authorization.   Follow Up Recommendations  No OT follow up    Assistance Recommended at Discharge Intermittent Supervision/Assistance  Functional Status Assessment  Patient has had a recent decline in their functional status and demonstrates the ability to make significant improvements in function in a reasonable and predictable amount of time.  Equipment Recommendations  None recommended by OT       Precautions / Restrictions Precautions Precautions: Fall;Back Precaution Booklet Issued: Yes (comment) Precaution Comments: verbalized understanding of all precautions Required Braces or Orthoses: Spinal Brace Spinal Brace: Lumbar corset;Applied in sitting position Restrictions Weight Bearing Restrictions: No      Mobility Bed Mobility Overal bed mobility: Needs Assistance              General bed mobility comments: pt sitting up upon arrival, verbalized understanding of log roll technique    Transfers Overall transfer level: Needs assistance Equipment used: Rolling walker (2 wheels) Transfers: Sit to/from Stand Sit to Stand: Modified independent (Device/Increase time)           General transfer comment: verbal cues to push up from bed not pull up on walker      Balance Overall balance assessment: Needs assistance Sitting-balance support: Feet supported       Standing balance support: No upper extremity supported;During functional activity Standing balance-Leahy Scale: Fair                             ADL either performed or assessed with clinical judgement   ADL Overall ADL's : Needs assistance/impaired Eating/Feeding: Independent;Sitting   Grooming: Oral care;Cueing for compensatory techniques;Standing   Upper Body Bathing: Supervision/ safety;Cueing for compensatory techniques;Sitting   Lower Body Bathing: Minimal assistance;Cueing for compensatory techniques;Sitting/lateral leans   Upper Body Dressing : Set up;Sitting   Lower Body Dressing: Min guard;Cueing for compensatory techniques;Cueing for back precautions;Sit to/from stand   Toilet Transfer: Supervision/safety;Ambulation   Toileting- Clothing Manipulation and Hygiene: Supervision/safety;Sitting/lateral lean       Functional mobility during ADLs: Supervision/safety General ADL Comments: limited by pain and back precautions - pt demonstrated great ability to complete ADLs while maintaing back precautions wtih verbal cues     Vision Baseline Vision/History: 1 Wears glasses Ability to See in Adequate Light: 0 Adequate Vision Assessment?: No apparent visual deficits     Perception     Praxis      Pertinent Vitals/Pain Pain Assessment: 0-10 Pain Score: 2  Faces Pain Scale: Hurts  a little bit Pain Location: sx site Pain Descriptors / Indicators:  Discomfort Pain Intervention(s): Monitored during session     Hand Dominance Right   Extremity/Trunk Assessment Upper Extremity Assessment Upper Extremity Assessment: Overall WFL for tasks assessed   Lower Extremity Assessment Lower Extremity Assessment: Generalized weakness   Cervical / Trunk Assessment Cervical / Trunk Assessment: Back Surgery   Communication Communication Communication: No difficulties   Cognition Arousal/Alertness: Awake/alert Behavior During Therapy: WFL for tasks assessed/performed Overall Cognitive Status: Within Functional Limits for tasks assessed                                       General Comments  VSS    Exercises     Shoulder Instructions      Home Living Family/patient expects to be discharged to:: Private residence Living Arrangements: Children Available Help at Discharge: Family;Available 24 hours/day Type of Home: House Home Access: Stairs to enter [employer] of Steps: 3 Entrance Stairs-Rails: Can reach both Home Layout: One level     Bathroom Shower/Tub: Occupational psychologist: Handicapped height     Home Equipment: Conservation officer, nature (2 wheels);Shower seat          Prior Functioning/Environment Prior Level of Function : Needs assist       Physical Assist : Mobility (physical) Mobility (physical): Gait   Mobility Comments: had to use RW for the last 2 months ADLs Comments: dtr to help will ADLs        OT Problem List: Decreased range of motion;Decreased activity tolerance;Impaired balance (sitting and/or standing);Decreased safety awareness;Decreased knowledge of use of DME or AE;Decreased knowledge of precautions;Pain      OT Treatment/Interventions:      OT Goals(Current goals can be found in the care plan section) Acute Rehab OT Goals Patient Stated Goal: home today OT Goal Formulation: All assessment and education complete, DC therapy  OT Frequency:     Barriers to  D/C:            Co-evaluation              AM-PAC OT "6 Clicks" Daily Activity     Outcome Measure Help from another person eating meals?: None Help from another person taking care of personal grooming?: A Little Help from another person toileting, which includes using toliet, bedpan, or urinal?: A Little Help from another person bathing (including washing, rinsing, drying)?: A Little Help from another person to put on and taking off regular upper body clothing?: None Help from another person to put on and taking off regular lower body clothing?: A Little 6 Click Score: 20   End of Session Equipment Utilized During Treatment: Back brace Nurse Communication: Mobility status;Precautions  Activity Tolerance: Patient tolerated treatment well Patient left: in bed;with call bell/phone within reach  OT Visit Diagnosis: Other abnormalities of gait and mobility (R26.89);Pain                Time: 9147-8295 OT Time Calculation (min): 20 min Charges:  OT General Charges $OT Visit: 1 Visit OT Evaluation $OT Eval Low Complexity: 1 Low   Neveen Daponte A Lain Tetterton 11/28/2020, 10:19 AM

## 2020-11-28 NOTE — Evaluation (Signed)
Physical Therapy Evaluation and DISCHARGE Patient Details Name: [name] MRN: [id number] DOB: 05/04/1960 Today's Date: 11/28/2020  History of Present Illness  [name]  60 y.o. female who is now s/p RIGHT LUMBAR FOUR-FIVE TRANSFORAMINAL LUMBAR INTEBODY FUSION WITH PEDICLE SCREWS, RODS AND CAGE, LOCAL AND ALLOGRAFT BONE GRAFT. PMHx: DM, hypertension, hypertension, hypothyroidism   Clinical Impression  Pt with good understanding of back precautions and mobilizing well. Pt demo'd good stair negotiation and ambulation with RW. Pt with good home set up and support. Pt with no further acute PT needs as this time. PT SIGNING OFF. Please re-consult if needed in future.       Recommendations for follow up therapy are one component of a multi-disciplinary discharge planning process, led by the attending physician.  Recommendations may be updated based on patient status, additional functional criteria and insurance authorization.  Follow Up Recommendations No PT follow up    Assistance Recommended at Discharge Intermittent Supervision/Assistance  Functional Status Assessment Patient has had a recent decline in their functional status and demonstrates the ability to make significant improvements in function in a reasonable and predictable amount of time.  Equipment Recommendations  Rolling walker (2 wheels)    Recommendations for Other Services       Precautions / Restrictions Precautions Precautions: Fall;Back Precaution Booklet Issued: Yes (comment) Precaution Comments: verbalized understanding of all precautions Required Braces or Orthoses: Spinal Brace Spinal Brace: Lumbar corset;Applied in sitting position Restrictions Weight Bearing Restrictions: No      Mobility  Bed Mobility Overal bed mobility: Needs Assistance             General bed mobility comments: pt sitting up upon arrival, verbalized understanding of log roll technique    Transfers Overall  transfer level: Needs assistance Equipment used: Rolling walker (2 wheels) Transfers: Sit to/from Stand Sit to Stand: Modified independent (Device/Increase time)           General transfer comment: verbal cues to push up from bed not pull up on walker    Ambulation/Gait Ambulation/Gait assistance: Supervision Gait Distance (Feet): 200 Feet Assistive device: Rolling walker (2 wheels) Gait Pattern/deviations: Step-through pattern;Decreased stride length Gait velocity: dec but better than prior to surgery Gait velocity interpretation: <1.31 ft/sec, indicative of household ambulator General Gait Details: verbal cues to relax shoulders and maintain upright posture  Stairs Stairs: Yes Stairs assistance: Min guard Stair Management: One rail Right;Step to pattern;Forwards Number of Stairs: 3 General stair comments: pt with good understanding and return demonstrated good technique  Wheelchair Mobility    Modified Rankin (Stroke Patients Only)       Balance Overall balance assessment: Needs assistance Sitting-balance support: Feet supported       Standing balance support: No upper extremity supported;During functional activity Standing balance-Leahy Scale: Fair                               Pertinent Vitals/Pain Pain Assessment: 0-10 Pain Score: 2  Faces Pain Scale: Hurts a little bit Pain Location: sx site Pain Descriptors / Indicators: Discomfort Pain Intervention(s): Monitored during session    Home Living Family/patient expects to be discharged to:: Private residence Living Arrangements: Children Available Help at Discharge: Family;Available 24 hours/day Type of Home: House Home Access: Stairs to enter Entrance Stairs-Rails: Can reach both Entrance Stairs-Number of Steps: 3   Home Layout: One level Home Equipment: Conservation officer, nature (2 wheels);Shower seat      Prior  Function Prior Level of Function : Needs assist       Physical Assist :  Mobility (physical) Mobility (physical): Gait   Mobility Comments: had to use RW for the last 2 months ADLs Comments: dtr to help will ADLs     Hand Dominance   Dominant Hand: Right    Extremity/Trunk Assessment   Upper Extremity Assessment Upper Extremity Assessment: Overall WFL for tasks assessed    Lower Extremity Assessment Lower Extremity Assessment: Generalized weakness    Cervical / Trunk Assessment Cervical / Trunk Assessment: Back Surgery  Communication   Communication: No difficulties  Cognition Arousal/Alertness: Awake/alert Behavior During Therapy: WFL for tasks assessed/performed Overall Cognitive Status: Within Functional Limits for tasks assessed                                          General Comments General comments (skin integrity, edema, etc.): VSS    Exercises     Assessment/Plan    PT Assessment Patient does not need any further PT services  PT Problem List         PT Treatment Interventions      PT Goals (Current goals can be found in the Care Plan section)  Acute Rehab PT Goals Patient Stated Goal: home PT Goal Formulation: All assessment and education complete, DC therapy    Frequency     Barriers to discharge        Co-evaluation               AM-PAC PT "6 Clicks" Mobility  Outcome Measure Help needed turning from your back to your side while in a flat bed without using bedrails?: None Help needed moving from lying on your back to sitting on the side of a flat bed without using bedrails?: None Help needed moving to and from a bed to a chair (including a wheelchair)?: None Help needed standing up from a chair using your arms (e.g., wheelchair or bedside chair)?: None Help needed to walk in hospital room?: None Help needed climbing 3-5 steps with a railing? : A Little 6 Click Score: 23    End of Session Equipment Utilized During Treatment: Gait belt;Back brace Activity Tolerance: Patient tolerated  treatment well Patient left: in bed;with call bell/phone within reach (sitting EOB) Nurse Communication: Mobility status PT Visit Diagnosis: Unsteadiness on feet (R26.81);Muscle weakness (generalized) (M62.81);Difficulty in walking, not elsewhere classified (R26.2)    Time: 1025-8527 PT Time Calculation (min) (ACUTE ONLY): 22 min   Charges:   PT Evaluation $PT Eval Moderate Complexity: 1 Mod          Kittie Plater, PT, DPT Acute Rehabilitation Services Pager #: [phone] Office #: [phone]   Berline Lopes 11/28/2020, 9:40 AM

## 2020-11-29 ENCOUNTER — Telehealth: Payer: Self-pay | Admitting: Specialist

## 2020-11-29 NOTE — Telephone Encounter (Signed)
Patient called advised Dr Louanne Skye want her to come in on 12/11/2020 instead of 12/15/2020. The number to contact patient is [phone]

## 2020-11-29 NOTE — Telephone Encounter (Signed)
Verified with Dr. Louanne Skye and he would like to see patient at two week mark for staple removal. She has been switched from 11/11 to 11/07 as requested. Patient has been contacted and made aware.

## 2020-12-01 NOTE — Discharge Summary (Signed)
Patient ID: [name] MRN: [id number] DOB/AGE: 1961/02/01 60 y.o.  Admit date: 11/27/2020 Discharge date: 11/28/2020  Admission Diagnoses:  Principal Problem:   Spondylolisthesis, lumbar region Active Problems:   Spinal stenosis, lumbar region with neurogenic claudication   Fusion of spine of lumbar region   Discharge Diagnoses:  Principal Problem:   Spondylolisthesis, lumbar region Active Problems:   Spinal stenosis, lumbar region with neurogenic claudication   Fusion of spine of lumbar region  status post Procedure(s): RIGHT LUMBAR FOUR-FIVE TRANSFORAMINAL LUMBAR INTEBODY FUSION WITH PEDICLE SCREWS, RODS AND CAGE, LOCAL AND ALLOGRAFT BONE GRAFT, VIVIGEN  Past Medical History:  Diagnosis Date   ALLERGIC RHINITIS    Allergy to IVP dye    Coronary artery disease    Diabetes mellitus, type 2 ([place])    History of tobacco abuse    Quit 2011   Hypertension    Hypertriglyceridemia    Hypothyroidism    NSTEMI (non-ST elevated myocardial infarction) ([place]) 09/30/2011   NO CAD by cath, ? coronary vasospasm   Obesity    Sleep apnea     Surgeries: Procedure(s): RIGHT LUMBAR FOUR-FIVE TRANSFORAMINAL LUMBAR INTEBODY FUSION WITH PEDICLE SCREWS, RODS AND CAGE, LOCAL AND ALLOGRAFT BONE GRAFT, VIVIGEN on 11/27/2020   Consultants:   Discharged Condition: Improved  Hospital Course: [name] is an 60 y.o. female who was admitted 11/27/2020 for operative treatment of Spondylolisthesis, lumbar region. Patient failed conservative treatments (please see the history and physical for the specifics) and had severe unremitting pain that affects sleep, daily activities and work/hobbies. After pre-op clearance, the patient was taken to the operating room on 11/27/2020 and underwent  Procedure(s): RIGHT LUMBAR FOUR-FIVE TRANSFORAMINAL LUMBAR INTEBODY FUSION WITH PEDICLE SCREWS, RODS AND CAGE, LOCAL AND ALLOGRAFT BONE GRAFT, VIVIGEN.    Patient was given perioperative antibiotics:   Anti-infectives (From admission, onward)    Start     Dose/Rate Route Frequency Ordered Stop   11/27/20 1600  ceFAZolin (ANCEF) IVPB 2g/100 mL premix        2 g 200 mL/hr over 30 Minutes Intravenous Every 8 hours 11/27/20 1407 11/27/20 2356   11/27/20 0600  ceFAZolin (ANCEF) IVPB 3g/100 mL premix        3 g 200 mL/hr over 30 Minutes Intravenous On call to O.R. 11/27/20 7616 11/27/20 0737        Patient was given sequential compression devices and early ambulation to prevent DVT.   Patient benefited maximally from hospital stay and there were no complications. At the time of discharge, the patient was urinating/moving their bowels without difficulty, tolerating a regular diet, pain is controlled with oral pain medications and they have been cleared by PT/OT.   Recent vital signs: No data found.   Recent laboratory studies: No results for input(s): WBC, HGB, HCT, PLT, NA, K, CL, CO2, BUN, CREATININE, GLUCOSE, INR, CALCIUM in the last 72 hours.  Invalid input(s): PT, 2   Discharge Medications:   Allergies as of 11/28/2020       Reactions   Contrast Media [iodinated Diagnostic Agents] Swelling   Skin Peeling and break out   Codeine Itching   Other    NO PLASTIC TAPE - Blisters and skin removal        Medication List     STOP taking these medications    HYDROcodone-acetaminophen 5-325 MG tablet Commonly known as: NORCO/VICODIN Replaced by: HYDROcodone-acetaminophen 10-325 MG tablet   losartan 25 MG tablet Commonly known as: COZAAR  TAKE these medications    amLODipine-valsartan 5-160 MG tablet Commonly known as: EXFORGE Take 1 tablet by mouth daily.   aspirin EC 81 MG tablet Take 81 mg by mouth daily.   gabapentin 300 MG capsule Commonly known as: NEURONTIN Take 1 capsule (300 mg total) by mouth 2 (two) times daily.   HYDROcodone-acetaminophen 10-325 MG tablet Commonly known as: NORCO Take 1 tablet by mouth every 4 (four) hours as needed for  moderate pain ((score 4 to 6)). Replaces: HYDROcodone-acetaminophen 5-325 MG tablet   isosorbide dinitrate 30 MG tablet Commonly known as: ISORDIL Take 30 mg by mouth 2 (two) times daily.   Lantus SoloStar 100 UNIT/ML Solostar Pen Generic drug: insulin glargine Inject 15 Units into the skin at bedtime.   levothyroxine 150 MCG tablet Commonly known as: SYNTHROID Take 150 mcg by mouth daily before breakfast.   methocarbamol 500 MG tablet Commonly known as: ROBAXIN Take 1 tablet (500 mg total) by mouth every 6 (six) hours as needed for muscle spasms.   metoprolol succinate 50 MG 24 hr tablet Commonly known as: TOPROL-XL Take 50 mg by mouth daily.   Mitigare 0.6 MG Caps Generic drug: Colchicine Take 0.6 mg by mouth daily.   nitroGLYCERIN 0.4 MG SL tablet Commonly known as: NITROSTAT Place 1 tablet (0.4 mg total) under the tongue every 5 (five) minutes as needed for chest pain (up to 3 doses).   NovoLOG FlexPen 100 UNIT/ML FlexPen Generic drug: insulin aspart Inject 15-18 Units into the skin 3 (three) times daily with meals.   rosuvastatin 40 MG tablet Commonly known as: CRESTOR Take 40 mg by mouth daily.   traZODone 50 MG tablet Commonly known as: DESYREL Take 50 mg by mouth at bedtime as needed for sleep.   Trulicity 1.5 FF/6.3WG Sopn Generic drug: Dulaglutide Inject 1.5 mg into the skin every Friday.        Diagnostic Studies: DG Lumbar Spine Complete  Result Date: 11/27/2020 CLINICAL DATA:  RIGHT LUMBAR FOUR-FIVE TRANSFORAMINAL LUMBAR INTEBODY FUSION EXAM: LUMBAR SPINE - COMPLETE 4+ VIEW COMPARISON:  X-ray lumbar spine 01/25/2020 FINDINGS: Intraoperative L4-L5 posterolateral and interbody fusion. 4 low resolution intraoperative spot views of the lower lumbar were obtained. Improved anatomical alignment of the L4-L5 level. No fracture visible on the limited views. Total fluoroscopy time:  1 minute and 50 seconds Total radiation dose:  112.90 IMPRESSION:  Intraoperative L4-L5 posterolateral and interbody fusion. Electronically Signed   By: Iven Finn M.D.   On: 11/27/2020 15:21   DG C-Arm 1-60 Min-No Report  Result Date: 11/27/2020 Fluoroscopy was utilized by the requesting physician.  No radiographic interpretation.   DG C-Arm 1-60 Min-No Report  Result Date: 11/27/2020 Fluoroscopy was utilized by the requesting physician.  No radiographic interpretation.   DG C-Arm 1-60 Min-No Report  Result Date: 11/27/2020 Fluoroscopy was utilized by the requesting physician.  No radiographic interpretation.   MM 3D SCREEN BREAST BILATERAL  Result Date: 11/25/2020 CLINICAL DATA:  Screening. EXAM: DIGITAL SCREENING BILATERAL MAMMOGRAM WITH TOMOSYNTHESIS AND CAD TECHNIQUE: Bilateral screening digital craniocaudal and mediolateral oblique mammograms were obtained. Bilateral screening digital breast tomosynthesis was performed. The images were evaluated with computer-aided detection. COMPARISON:  Previous exam(s). ACR Breast Density Category b: There are scattered areas of fibroglandular density. FINDINGS: There are no findings suspicious for malignancy. IMPRESSION: No mammographic evidence of malignancy. A result letter of this screening mammogram will be mailed directly to the patient. RECOMMENDATION: Screening mammogram in one year. (Code:SM-B-01Y) BI-RADS CATEGORY  1: Negative. Electronically Signed  By: Dorise Bullion III M.D.   On: 11/25/2020 11:17    Discharge Instructions     Call MD / Call 911   Complete by: As directed    If you experience chest pain or shortness of breath, CALL 911 and be transported to the hospital emergency room.  If you develope a fever above 101 F, pus (white drainage) or increased drainage or redness at the wound, or calf pain, call your surgeon's office.   Constipation Prevention   Complete by: As directed    Drink plenty of fluids.  Prune juice may be helpful.  You may use a stool softener, such as Colace (over  the counter) 100 mg twice a day.  Use MiraLax (over the counter) for constipation as needed.   Diet Carb Modified   Complete by: As directed    Discharge instructions   Complete by: As directed    Call if there is increasing drainage, fever greater than 101.5, severe head aches, and worsening nausea or light sensitivity. If shortness of breath, bloody cough or chest tightness or pain go to an emergency room. No lifting greater than 10 lbs. Avoid bending, stooping and twisting. Use brace when sitting and out of bed even to go to bathroom. Walk in house for first 2 weeks then may start to get out slowly increasing distances up to one mile by 4-6 weeks post op. After 5 days may shower and change dressing following bathing with shower.When bathing remove the brace shower and replace brace before getting out of the shower. If drainage, keep dry dressing and do not bathe the incision, use an moisture impervious dressing. Please call and return for scheduled follow up appointment 2 weeks from the time of surgery.   Driving restrictions   Complete by: As directed    No driving for 3 weeks   Increase activity slowly as tolerated   Complete by: As directed    Lifting restrictions   Complete by: As directed    No lifting for 8 weeks   Post-operative opioid taper instructions:   Complete by: As directed    POST-OPERATIVE OPIOID TAPER INSTRUCTIONS: It is important to wean off of your opioid medication as soon as possible. If you do not need pain medication after your surgery it is ok to stop day one. Opioids include: Codeine, Hydrocodone(Norco, Vicodin), Oxycodone(Percocet, oxycontin) and hydromorphone amongst others.  Long term and even short term use of opiods can cause: Increased pain response Dependence Constipation Depression Respiratory depression And more.  Withdrawal symptoms can include Flu like symptoms Nausea, vomiting And more Techniques to manage these symptoms Hydrate  well Eat regular healthy meals Stay active Use relaxation techniques(deep breathing, meditating, yoga) Do Not substitute Alcohol to help with tapering If you have been on opioids for less than two weeks and do not have pain than it is ok to stop all together.  Plan to wean off of opioids This plan should start within one week post op of your joint replacement. Maintain the same interval or time between taking each dose and first decrease the dose.  Cut the total daily intake of opioids by one tablet each day Next start to increase the time between doses. The last dose that should be eliminated is the evening dose.           Follow-up Information     Jessy Oto, MD Follow up in 2 week(s).   Specialty: Orthopedic Surgery Contact information: [place]  [phone]                 Discharge Plan:  discharge to home  Disposition:     Signed: Benjiman Core  12/01/2020, 11:28 AM

## 2020-12-04 ENCOUNTER — Ambulatory Visit: Payer: Medicare HMO | Admitting: Cardiology

## 2020-12-11 ENCOUNTER — Ambulatory Visit (INDEPENDENT_AMBULATORY_CARE_PROVIDER_SITE_OTHER): Payer: Medicare HMO | Admitting: Specialist

## 2020-12-11 ENCOUNTER — Other Ambulatory Visit: Payer: Self-pay

## 2020-12-11 ENCOUNTER — Encounter: Payer: Self-pay | Admitting: Specialist

## 2020-12-11 ENCOUNTER — Ambulatory Visit: Payer: Self-pay

## 2020-12-11 VITALS — BP 125/74 | HR 74 | Ht 68.0 in | Wt 292.0 lb

## 2020-12-11 DIAGNOSIS — Z981 Arthrodesis status: Secondary | ICD-10-CM

## 2020-12-11 NOTE — Patient Instructions (Signed)
Call if there is increasing drainage, fever greater than 101.5, severe head aches, and worsening nausea or light sensitivity. If shortness of breath, bloody cough or chest tightness or pain go to an emergency room. No lifting greater than 10 lbs. Avoid bending, stooping and twisting. Use brace when sitting and out of bed even to go to bathroom. Walk in house for first 2 weeks then may start to get out slowly increasing distances up to one half mile by 4-6 weeks post op. May shower and place dressing following bathing with shower.When bathing remove the brace shower and replace brace before getting out of the shower. If drainage, keep dry dressing and do not bathe the incision, use an moisture impervious dressing.

## 2020-12-11 NOTE — Addendum Note (Signed)
Addended by: Basil Dess on: 12/11/2020 03:09 PM   Modules accepted: Orders

## 2020-12-11 NOTE — Progress Notes (Signed)
Post-Op Visit Note   Patient: [name]           Date of Birth: Jun 04, 1960           MRN: [id number] Visit Date: 12/11/2020 PCP: Benito Mccreedy, MD   Assessment & Plan: 2 weeks post op L4-5 TLIF for spondylolisthesis  Chief Complaint:  Chief Complaint  Patient presents with   Lower Back - Routine Post Op  Incision is with minimal drainage, staple to be removed today, no fluctuance. Hypesthesia right anterior chin L4. Motor is normal She can walk as much as she wants now and is pleased. Xrays Staples out.    Visit Diagnoses:  1. S/P lumbar fusion    Plan:    Call if there is increasing drainage, fever greater than 101.5, severe head aches, and worsening nausea or light sensitivity. If shortness of breath, bloody cough or chest tightness or pain go to an emergency room. No lifting greater than 10 lbs. Avoid bending, stooping and twisting. Use brace when sitting and out of bed even to go to bathroom. Walk in house for first 2 weeks then may start to get out slowly increasing distances up to one half mile by 4-6 weeks post op. May shower and change dressing following bathing with shower.When bathing remove the brace shower and replace brace before getting out of the shower. If drainage, keep dry dressing and do not bathe the incision, use an moisture impervious dressing. Please call and return for scheduled follow up appointment 2 weeks from the time of surgery.    Follow-Up Instructions: No follow-ups on file.   Orders:  Orders Placed This Encounter  Procedures   XR Lumbar Spine 2-3 Views   No orders of the defined types were placed in this encounter.   Imaging: No results found.  PMFS History: Patient Active Problem List   Diagnosis Date Noted   Spondylolisthesis, lumbar region 11/27/2020    Priority: High    Class: Chronic   Spinal stenosis, lumbar region with neurogenic claudication 11/27/2020    Priority: High    Class: Chronic   Fusion  of spine of lumbar region 11/27/2020   Severe obstructive sleep apnea 11/09/2018   Healthcare maintenance 11/09/2018   At risk for obstructive sleep apnea 04/16/2018   Acquired hypothyroidism 03/17/2018   DOE (dyspnea on exertion) 03/16/2018   NSTEMI (non-ST elevated myocardial infarction) ([place]) 08/18/2017   Right sided sciatica 07/10/2017   Right low back pain 07/10/2017   Chest pain 09/29/2011   Morbid obesity ([place]) 09/29/2011   DM type 2 (diabetes mellitus, type 2) ([place]) 09/29/2011   HTN (hypertension) 09/29/2011   Pneumonia, bacterial 08/07/2010   Incidental lung nodule, > 22mm and < 75mm 08/07/2010   Bronchitis 08/07/2010   Past Medical History:  Diagnosis Date   ALLERGIC RHINITIS    Allergy to IVP dye    Coronary artery disease    Diabetes mellitus, type 2 ([place])    History of tobacco abuse    Quit 2011   Hypertension    Hypertriglyceridemia    Hypothyroidism    NSTEMI (non-ST elevated myocardial infarction) ([place]) 09/30/2011   NO CAD by cath, ? coronary vasospasm   Obesity    Sleep apnea     Family History  Problem Relation Age of Onset   Heart disease Mother        has pacemaker   Diabetes Mother    Diabetes Other        strong family history  Cancer Maternal Uncle    Hypertension Brother    Diabetes Brother    Hypertension Brother     Past Surgical History:  Procedure Laterality Date   CORONARY STENT INTERVENTION N/A 08/19/2017   Procedure: CORONARY STENT INTERVENTION;  Surgeon: [name], MD;  Location: [facility];  Service: Cardiovascular;  Laterality: N/A;   LEFT HEART CATH AND CORONARY ANGIOGRAPHY N/A 08/19/2017   Procedure: LEFT HEART CATH AND CORONARY ANGIOGRAPHY;  Surgeon: [name], MD;  Location: [facility];  Service: Cardiovascular;  Laterality: N/A;   LEFT HEART CATHETERIZATION WITH CORONARY ANGIOGRAM N/A 09/30/2011   Procedure: LEFT HEART CATHETERIZATION WITH CORONARY ANGIOGRAM;  Surgeon: Burnell Blanks, MD;  Location: [facility]  CATH LAB;  Service: Cardiovascular;  Laterality: N/A;   TUBAL LIGATION     VESICOVAGINAL FISTULA CLOSURE W/ TAH     Social History   Occupational History   Occupation: Retired  Tobacco Use   Smoking status: Former    Packs/day: 0.80    Years: 36.00    Pack years: 28.80    Types: Cigarettes    Quit date: 02/05/2011    Years since quitting: 9.8   Smokeless tobacco: Never  Vaping Use   Vaping Use: Never used  Substance and Sexual Activity   Alcohol use: Yes    Comment: VERY RARE   Drug use: No   Sexual activity: Not on file

## 2020-12-15 ENCOUNTER — Encounter: Payer: Medicare HMO | Admitting: Specialist

## 2020-12-20 ENCOUNTER — Other Ambulatory Visit: Payer: Self-pay | Admitting: Radiology

## 2020-12-20 MED ORDER — HYDROCODONE-ACETAMINOPHEN 10-325 MG PO TABS: 1.0000 | ORAL_TABLET | ORAL | 0 refills | Status: DC | PRN

## 2020-12-20 NOTE — Telephone Encounter (Signed)
See message f rom patient

## 2020-12-30 DIAGNOSIS — E119 Type 2 diabetes mellitus without complications: Secondary | ICD-10-CM | POA: Diagnosis not present

## 2021-01-01 ENCOUNTER — Encounter: Payer: Self-pay | Admitting: Cardiology

## 2021-01-01 ENCOUNTER — Other Ambulatory Visit: Payer: Self-pay

## 2021-01-01 ENCOUNTER — Ambulatory Visit: Payer: Medicare HMO | Admitting: Cardiology

## 2021-01-01 VITALS — BP 126/69 | HR 70 | Resp 16 | Ht 68.0 in | Wt 296.0 lb

## 2021-01-01 DIAGNOSIS — E782 Mixed hyperlipidemia: Secondary | ICD-10-CM | POA: Diagnosis not present

## 2021-01-01 DIAGNOSIS — Z6841 Body Mass Index (BMI) 40.0 and over, adult: Secondary | ICD-10-CM

## 2021-01-01 DIAGNOSIS — Z955 Presence of coronary angioplasty implant and graft: Secondary | ICD-10-CM | POA: Diagnosis not present

## 2021-01-01 DIAGNOSIS — I10 Essential (primary) hypertension: Secondary | ICD-10-CM

## 2021-01-01 DIAGNOSIS — Z794 Long term (current) use of insulin: Secondary | ICD-10-CM

## 2021-01-01 DIAGNOSIS — I2510 Atherosclerotic heart disease of native coronary artery without angina pectoris: Secondary | ICD-10-CM

## 2021-01-01 DIAGNOSIS — Z8249 Family history of ischemic heart disease and other diseases of the circulatory system: Secondary | ICD-10-CM

## 2021-01-01 DIAGNOSIS — Z87891 Personal history of nicotine dependence: Secondary | ICD-10-CM | POA: Diagnosis not present

## 2021-01-01 DIAGNOSIS — E1159 Type 2 diabetes mellitus with other circulatory complications: Secondary | ICD-10-CM | POA: Diagnosis not present

## 2021-01-01 NOTE — Progress Notes (Signed)
Date:  01/01/2021   ID:  [name], DOB 26-Oct-1960, MRN [id number]  PCP:  Benito Mccreedy, MD  Cardiologist:  Rex Kras, DO, [facility] (established care 06/01/2020) Former Cardiology Providers: Dr. Angelena Form, Dr. Acie Fredrickson, Dr. Einar Gip, Dr. [name], Dr. Layne Benton  Date: 01/01/21 Last Office Visit: 08/31/2020  Chief Complaint  Patient presents with   Coronary Artery Disease   Follow-up    HPI  [name] is a 60 y.o. female who presents to the office with a chief complaint of " CAD follow-up status post surgery." Patient's past medical history and cardiovascular risk factors include: Hypertension, hyperlipidemia, insulin dependent diabetes mellitus type 2, family history of premature CAD (mom had PCI's at the age of 79), established CAD status post PCI to the RCA, former smoker, OSA on CPAP, obesity due to excess calories.  She is referred to the office at the request of Osei-Bonsu, Iona Beard, MD for evaluation of preoperative risk stratification.  Patient was formally under the care of Dr. Maryan Rued at [facility].   Since last office visit patient has undergone lumbar fusion surgery on 11/23/2020 and postoperatively has done well.  Given her history of CAD, insulin-dependent diabetes, and multiple cardiovascular risk factors she was deemed appropriate/acceptable candidate for surgery but still moderate risk and this was felt to be nonmodifiable.  Patient states that she is done well after surgery and has increased her physical activity.  She currently has a back brace and ambulates with a cane.  She denies any chest pain or shortness of breath at rest or with effort related activities.  No hospitalizations for cardiovascular symptoms.  FUNCTIONAL STATUS: No structured exercise program or daily routine.   ALLERGIES: Allergies  Allergen Reactions   Contrast Media [Iodinated Diagnostic Agents] Swelling    Skin Peeling and break out   Codeine Itching   Other      NO PLASTIC TAPE - Blisters and skin removal    MEDICATION LIST PRIOR TO VISIT: Current Meds  Medication Sig   amLODipine-valsartan (EXFORGE) 5-160 MG tablet Take 1 tablet by mouth daily.   aspirin EC 81 MG tablet Take 81 mg by mouth daily.   HYDROcodone-acetaminophen (NORCO) 10-325 MG tablet Take 1 tablet by mouth every 4 (four) hours as needed for moderate pain ((score 4 to 6)).   isosorbide dinitrate (ISORDIL) 30 MG tablet Take 30 mg by mouth 2 (two) times daily.   LANTUS SOLOSTAR 100 UNIT/ML Solostar Pen Inject 15 Units into the skin at bedtime.   levothyroxine (SYNTHROID) 150 MCG tablet Take 150 mcg by mouth daily before breakfast.   methocarbamol (ROBAXIN) 500 MG tablet Take 1 tablet (500 mg total) by mouth every 6 (six) hours as needed for muscle spasms.   metoprolol succinate (TOPROL-XL) 50 MG 24 hr tablet Take 50 mg by mouth daily.   MITIGARE 0.6 MG CAPS Take 0.6 mg by mouth every other day.   nitroGLYCERIN (NITROSTAT) 0.4 MG SL tablet Place 1 tablet (0.4 mg total) under the tongue every 5 (five) minutes as needed for chest pain (up to 3 doses).   NOVOLOG FLEXPEN 100 UNIT/ML FlexPen Inject 15-18 Units into the skin 3 (three) times daily with meals.   rosuvastatin (CRESTOR) 40 MG tablet Take 40 mg by mouth daily.   traZODone (DESYREL) 50 MG tablet Take 50 mg by mouth at bedtime as needed for sleep.   TRULICITY 1.5 XB/9.3JQ SOPN Inject 1.5 mg into the skin every Friday.     PAST MEDICAL  HISTORY: Past Medical History:  Diagnosis Date   ALLERGIC RHINITIS    Allergy to IVP dye    Coronary artery disease    Diabetes mellitus, type 2 ([place])    History of tobacco abuse    Quit 2011   Hypertension    Hypertriglyceridemia    Hypothyroidism    NSTEMI (non-ST elevated myocardial infarction) ([place]) 09/30/2011   NO CAD by cath, ? coronary vasospasm   Obesity    Sleep apnea     PAST SURGICAL HISTORY: Past Surgical History:  Procedure Laterality Date   CORONARY STENT  INTERVENTION N/A 08/19/2017   Procedure: CORONARY STENT INTERVENTION;  Surgeon: [name], MD;  Location: [place] CV LAB;  Service: Cardiovascular;  Laterality: N/A;   LEFT HEART CATH AND CORONARY ANGIOGRAPHY N/A 08/19/2017   Procedure: LEFT HEART CATH AND CORONARY ANGIOGRAPHY;  Surgeon: [name], MD;  Location: [place] CV [facility];  Service: Cardiovascular;  Laterality: N/A;   LEFT HEART CATHETERIZATION WITH CORONARY ANGIOGRAM N/A 09/30/2011   Procedure: LEFT HEART CATHETERIZATION WITH CORONARY ANGIOGRAM;  Surgeon: Burnell Blanks, MD;  Location: [facility] CATH LAB;  Service: Cardiovascular;  Laterality: N/A;   TUBAL LIGATION     VESICOVAGINAL FISTULA CLOSURE W/ TAH      FAMILY HISTORY: The patient family history includes Cancer in her maternal uncle; Diabetes in her brother, mother, and another family member; Heart disease in her mother; Hypertension in her brother and brother.  SOCIAL HISTORY:  The patient  reports that she quit smoking about 9 years ago. Her smoking use included cigarettes. She has a 28.80 pack-year smoking history. She has never used smokeless tobacco. She reports current alcohol use. She reports that she does not use drugs.  REVIEW OF SYSTEMS: Review of Systems  Constitutional: Negative for chills and fever.  HENT:  Negative for hoarse voice and nosebleeds.   Eyes:  Negative for discharge, double vision and pain.  Cardiovascular:  Negative for chest pain, claudication, dyspnea on exertion, leg swelling, near-syncope, orthopnea, palpitations, paroxysmal nocturnal dyspnea and syncope.  Respiratory:  Negative for hemoptysis and shortness of breath.   Musculoskeletal:  Negative for muscle cramps and myalgias.  Gastrointestinal:  Negative for abdominal pain, constipation, diarrhea, hematemesis, hematochezia, melena, nausea and vomiting.  Neurological:  Negative for dizziness and light-headedness.   PHYSICAL EXAM: Vitals with BMI 01/01/2021 12/11/2020 11/28/2020   Height '5\' 8"'  '5\' 8"'  -  Weight 296 lbs 292 lbs -  BMI 23.95 32.02 -  Systolic 334 356 861  Diastolic 69 74 61  Pulse 70 74 65    CONSTITUTIONAL: Appears older than stated age, hemodynamically stable, ambulates with a cane, well-nourished. No acute distress.  SKIN: Skin is warm and dry. No rash noted. No cyanosis. No pallor. No jaundice HEAD: Normocephalic and atraumatic.  EYES: No scleral icterus MOUTH/THROAT: Moist oral membranes.  NECK: No JVD present. No thyromegaly noted. No carotid bruits  LYMPHATIC: No visible cervical adenopathy.  CHEST Normal respiratory effort. No intercostal retractions  LUNGS: Clear to auscultation bilaterally.  No stridor. No wheezes. No rales.  CARDIOVASCULAR: Regular, positive S1-S2, no murmurs rubs or gallops appreciated. ABDOMINAL: Obese, soft, nontender, nondistended, positive bowel sounds all 4 quadrants no apparent ascites.  EXTREMITIES: No peripheral edema  HEMATOLOGIC: No significant bruising NEUROLOGIC: Oriented to person, place, and time. Nonfocal. Normal muscle tone.  PSYCHIATRIC: Normal mood and affect. Normal behavior. Cooperative  CARDIAC DATABASE: EKG: 06/01/2020: Normal sinus rhythm, 72 bpm, normal axis, without underlying ischemia or injury pattern.  Echocardiogram: 06/08/2020: Technically difficult study. Left ventricle cavity is normal in size and wall thickness. Normal global wall motion. Normal LV systolic function with visual EF 50-55%. Doppler evidence of grade I (impaired) diastolic dysfunction, normal LAP. No significant valvular abnormality. Normal right atrial pressure. No significant change compared to previous study in 2019.   Stress Testing: Lexiscan Tetrofosmin stress test 06/07/2020: 1 Day Rest/Stress Protocol. Stress EKG is non-diagnostic, as this is pharmacological stress test using Lexiscan. No convincing evidence of reversible myocardial ischemia or prior infarct. Left ventricular wall thickness is preserved  without regional wall motion abnormalities. Calculated LVEF  51%, visually appears preserved. No prior studies available for comparison.  Heart Catheterization: Coronary angiogram 08/19/2017: Normal LV systolic function, EF 29%, normal LVEDP. Left main nonexistent, separate ostia for LAD and circumflex.  LAD has mild diffuse disease.  Circumflex is codominant with RCA and is more than normal. RCA is codominant with circumflex coronary artery.  Mid segment shows a ulcerated 80% stenosis S/P 3.0 x 20 mm Synergy, 80% to 0%, TIMI-3 to TIMI-3 flow maintained.   Recommend uninterrupted dual antiplatelet therapy with Aspirin 22m daily and Ticagrelor 968mtwice daily for a minimum of 12 months (ACS - Class I recommendation).     LABORATORY DATA: CBC Latest Ref Rng & Units 11/28/2020 11/17/2020 03/16/2018  WBC 4.0 - 10.5 K/uL 14.4(H) 9.9 10.4  Hemoglobin 12.0 - 15.0 g/dL 10.2(L) 13.1 13.2  Hematocrit 36.0 - 46.0 % 32.3(L) 40.8 40.2  Platelets 150 - 400 K/uL 286 303 337.0    CMP Latest Ref Rng & Units 11/28/2020 11/17/2020 03/16/2018  Glucose 70 - 99 mg/dL 180(H) 156(H) 307(H)  BUN 6 - 20 mg/dL '18 14 21  ' Creatinine 0.44 - 1.00 mg/dL 1.24(H) 1.02(H) 0.99  Sodium 135 - 145 mmol/L 138 137 136  Potassium 3.5 - 5.1 mmol/L 4.0 4.0 3.9  Chloride 98 - 111 mmol/L 106 106 102  CO2 22 - 32 mmol/L 20(L) 23 24  Calcium 8.9 - 10.3 mg/dL 8.6(L) 8.8(L) 8.8  Total Protein 6.5 - 8.1 g/dL 6.5 - -  Total Bilirubin 0.3 - 1.2 mg/dL 1.2 - -  Alkaline Phos 38 - 126 U/L 50 - -  AST 15 - 41 U/L 21 - -  ALT 0 - 44 U/L 26 - -    Lipid Panel     Component Value Date/Time   CHOL 159 08/18/2017 0901   TRIG 154 (H) 08/18/2017 0901   HDL 52 08/18/2017 0901   CHOLHDL 3.1 08/18/2017 0901   VLDL 31 08/18/2017 0901   LDLCALC 76 08/18/2017 0901    No components found for: NTPROBNP No results for input(s): PROBNP in the last 8760 hours. No results for input(s): TSH in the last 8760 hours.  BMP Recent Labs     11/17/20 1053 11/28/20 0500  NA 137 138  K 4.0 4.0  CL 106 106  CO2 23 20*  GLUCOSE 156* 180*  BUN 14 18  CREATININE 1.02* 1.24*  CALCIUM 8.8* 8.6*  GFRNONAA >60 50*    HEMOGLOBIN A1C Lab Results  Component Value Date   HGBA1C 7.5 (H) 11/27/2020   MPG 168.55 11/27/2020   External Labs: Collected: 04/21/2020 Creatinine 1.16 mg/dL. Sodium 139 potassium 4, chloride 105, bicarb 24 eGFR: 60 mL/min per 1.73 m Lipid profile: Total cholesterol 100, triglycerides 224 HDL 44, LDL 28, non-HDL 56 A1c 8.3 TSH: 0.18, free T4 1.4  IMPRESSION:    ICD-10-CM   1. Atherosclerosis of native coronary artery of native heart  without angina pectoris  I25.10     2. History of coronary angioplasty with insertion of stent  Z95.5     3. Type 2 diabetes mellitus with other circulatory complication, with long-term current use of insulin (HCC)  E11.59    Z79.4     4. Benign hypertension  I10     5. Mixed hyperlipidemia  E78.2     6. Former smoker  Z87.891     17. Family history of premature CAD  Z82.49     8. Class 3 severe obesity due to excess calories with serious comorbidity and body mass index (BMI) of 40.0 to 44.9 in adult [facility])  E66.01    Z68.41        RECOMMENDATIONS: [name] is a 60 y.o. female whose past medical history and cardiac risk factors include: Hypertension, hyperlipidemia, insulin dependent diabetes mellitus type 2, family history of premature CAD (mom had PCI's at the age of 72), established CAD status post PCI to the RCA, former smoker, OSA on CPAP, obesity due to excess calories.  Atherosclerosis of the coronary arteries status post angioplasty stenting: Chest pain-free. No use of sublingual nitroglycerin tablets since last office encounter. Most recent ischemic evaluation since last office visit reviewed with the patient. Underwent lumbar fusion surgery in October 2022 and has done well postoperatively. Monitor for now. Educated on importance of improving  her modifiable cardiovascular risk factors.  And increasing physical activity as tolerated with a goal of 30 minutes a day 5 days a week.  Benign essential hypertension: Office blood pressures within acceptable control. Home blood pressures are also well controlled, less than 120 mmHg.   Low-salt diet recommended. Increase physical activity as tolerated with a goal of 30 minutes a day 5 days a week. Currently managed by primary care provider. Currently does follow-up with nephrology.  Has an upcoming appointment later this week with Dr. Reeves Dam.  Insulin-dependent diabetes mellitus type 2:  Most recent hemoglobin A1c reviewed as of October 2022. Educated on the importance of glycemic control.   Currently on ARB, rosuvastatin..  Mixed hyperlipidemia: Recommend LDL levels less than 70 mg/dL. Lipids back in March 2022 noted LDL of 28 mg/dL. Patient states that she has had repeat blood work in October 2022 and will forward a copy for reference.  Former smoker: Educated on the importance of continued smoking cessation.  Obesity, due to excess calories: Body mass index is 45.01 kg/m. I reviewed with the patient the importance of diet, regular physical activity/exercise, weight loss.   Patient is educated on increasing physical activity gradually as tolerated.  With the goal of moderate intensity exercise for 30 minutes a day 5 days a week.  Recommended annual follow-up given her history of CAD and prior interventions.  However, patient would prefer q. 6 month follow-up.  FINAL MEDICATION LIST END OF ENCOUNTER: No orders of the defined types were placed in this encounter.   There are no discontinued medications.   Current Outpatient Medications:    amLODipine-valsartan (EXFORGE) 5-160 MG tablet, Take 1 tablet by mouth daily., Disp: , Rfl:    aspirin EC 81 MG tablet, Take 81 mg by mouth daily., Disp: , Rfl:    HYDROcodone-acetaminophen (NORCO) 10-325 MG tablet, Take 1 tablet by mouth  every 4 (four) hours as needed for moderate pain ((score 4 to 6))., Disp: 40 tablet, Rfl: 0   isosorbide dinitrate (ISORDIL) 30 MG tablet, Take 30 mg by mouth 2 (two) times daily., Disp: , Rfl:  LANTUS SOLOSTAR 100 UNIT/ML Solostar Pen, Inject 15 Units into the skin at bedtime., Disp: , Rfl:    levothyroxine (SYNTHROID) 150 MCG tablet, Take 150 mcg by mouth daily before breakfast., Disp: , Rfl:    methocarbamol (ROBAXIN) 500 MG tablet, Take 1 tablet (500 mg total) by mouth every 6 (six) hours as needed for muscle spasms., Disp: 30 tablet, Rfl: 1   metoprolol succinate (TOPROL-XL) 50 MG 24 hr tablet, Take 50 mg by mouth daily., Disp: , Rfl:    MITIGARE 0.6 MG CAPS, Take 0.6 mg by mouth every other day., Disp: , Rfl:    nitroGLYCERIN (NITROSTAT) 0.4 MG SL tablet, Place 1 tablet (0.4 mg total) under the tongue every 5 (five) minutes as needed for chest pain (up to 3 doses)., Disp: 25 tablet, Rfl: 3   NOVOLOG FLEXPEN 100 UNIT/ML FlexPen, Inject 15-18 Units into the skin 3 (three) times daily with meals., Disp: , Rfl:    rosuvastatin (CRESTOR) 40 MG tablet, Take 40 mg by mouth daily., Disp: , Rfl:    traZODone (DESYREL) 50 MG tablet, Take 50 mg by mouth at bedtime as needed for sleep., Disp: , Rfl:    TRULICITY 1.60 YO/4.1ZB SOPN, Inject 1.5 mg into the skin every Friday., Disp: , Rfl:   No orders of the defined types were placed in this encounter.   There are no Patient Instructions on file for this visit.   --Continue cardiac medications as reconciled in final medication list. --Return in about 6 months (around 07/01/2021) for Follow up, CAD. Or sooner if needed. --Continue follow-up with your primary care physician regarding the management of your other chronic comorbid conditions.  Patient's questions and concerns were addressed to her satisfaction. She voices understanding of the instructions provided during this encounter.   This note was created using a voice recognition software as a  result there may be grammatical errors inadvertently enclosed that do not reflect the nature of this encounter. Every attempt is made to correct such errors.  Rex Kras, [place], [facility]  Pager: [phone] Office: [phone]

## 2021-01-04 DIAGNOSIS — Z794 Long term (current) use of insulin: Secondary | ICD-10-CM | POA: Diagnosis not present

## 2021-01-04 DIAGNOSIS — E1165 Type 2 diabetes mellitus with hyperglycemia: Secondary | ICD-10-CM | POA: Diagnosis not present

## 2021-01-04 DIAGNOSIS — E039 Hypothyroidism, unspecified: Secondary | ICD-10-CM | POA: Diagnosis not present

## 2021-01-04 DIAGNOSIS — I2510 Atherosclerotic heart disease of native coronary artery without angina pectoris: Secondary | ICD-10-CM | POA: Diagnosis not present

## 2021-01-04 DIAGNOSIS — Z6841 Body Mass Index (BMI) 40.0 and over, adult: Secondary | ICD-10-CM | POA: Diagnosis not present

## 2021-01-04 DIAGNOSIS — E782 Mixed hyperlipidemia: Secondary | ICD-10-CM | POA: Diagnosis not present

## 2021-01-04 DIAGNOSIS — Z720 Tobacco use: Secondary | ICD-10-CM | POA: Diagnosis not present

## 2021-01-04 DIAGNOSIS — I10 Essential (primary) hypertension: Secondary | ICD-10-CM | POA: Diagnosis not present

## 2021-01-08 ENCOUNTER — Ambulatory Visit (INDEPENDENT_AMBULATORY_CARE_PROVIDER_SITE_OTHER): Payer: Medicare HMO | Admitting: Specialist

## 2021-01-08 ENCOUNTER — Other Ambulatory Visit: Payer: Self-pay

## 2021-01-08 ENCOUNTER — Encounter: Payer: Self-pay | Admitting: Specialist

## 2021-01-08 ENCOUNTER — Ambulatory Visit (INDEPENDENT_AMBULATORY_CARE_PROVIDER_SITE_OTHER): Payer: Medicare HMO

## 2021-01-08 VITALS — BP 122/81 | HR 76 | Ht 68.0 in | Wt 296.0 lb

## 2021-01-08 DIAGNOSIS — N1832 Chronic kidney disease, stage 3b: Secondary | ICD-10-CM | POA: Diagnosis not present

## 2021-01-08 DIAGNOSIS — Z981 Arthrodesis status: Secondary | ICD-10-CM

## 2021-01-08 DIAGNOSIS — Z4889 Encounter for other specified surgical aftercare: Secondary | ICD-10-CM

## 2021-01-08 DIAGNOSIS — H52223 Regular astigmatism, bilateral: Secondary | ICD-10-CM | POA: Diagnosis not present

## 2021-01-08 DIAGNOSIS — H524 Presbyopia: Secondary | ICD-10-CM | POA: Diagnosis not present

## 2021-01-08 MED ORDER — GABAPENTIN 100 MG PO CAPS: 100.0000 mg | ORAL_CAPSULE | Freq: Every day | ORAL | 3 refills | Status: DC

## 2021-01-08 NOTE — Progress Notes (Signed)
Post-Op Visit Note   Patient: [name]           Date of Birth: 05/02/1960           MRN: [id number] Visit Date: 01/08/2021 PCP: Benito Mccreedy, MD   Assessment & Plan: 6 weeks post op TLIF L4-5  for spondylolisthesis   Chief Complaint:  Chief Complaint  Patient presents with   Lower Back - Follow-up    11/27/2020 Right L4-5 TLIF  Motor with minimal right quad weakness. Numbness and paresthesias right L4  Incision is healed. Radiographs today good Position and alignment.  Visit Diagnoses:  1. S/P lumbar fusion     Plan: Avoid frequent bending and stooping  No lifting greater than 10 lbs. May use ice or moist heat for pain. Weight loss is of benefit. Gabapentin for nerve irritation 100 mg po at night Exercise is important to improve your indurance and does allow people to function better inspite of back pain.  CT scan of lumbar spine to assess the position and aligment of the hardware and to assess for any nerve compression.   Follow-Up Instructions: No follow-ups on file.   Orders:  Orders Placed This Encounter  Procedures   XR Lumbar Spine 2-3 Views   No orders of the defined types were placed in this encounter.   Imaging: XR Lumbar Spine 2-3 Views  Result Date: 01/08/2021 AP and lateral radiographs of the lumbar spine demonstrate pedicle screws and rods with interbody cage in good position and alignment. No acute changes, hardware is intact without loosening.    PMFS History: Patient Active Problem List   Diagnosis Date Noted   Spondylolisthesis, lumbar region 11/27/2020    Priority: High    Class: Chronic   Spinal stenosis, lumbar region with neurogenic claudication 11/27/2020    Priority: High    Class: Chronic   Fusion of spine of lumbar region 11/27/2020   Severe obstructive sleep apnea 11/09/2018   Healthcare maintenance 11/09/2018   At risk for obstructive sleep apnea 04/16/2018   Acquired hypothyroidism 03/17/2018   DOE (dyspnea on  exertion) 03/16/2018   NSTEMI (non-ST elevated myocardial infarction) ([place]) 08/18/2017   Right sided sciatica 07/10/2017   Right low back pain 07/10/2017   Chest pain 09/29/2011   Morbid obesity ([place]) 09/29/2011   DM type 2 (diabetes mellitus, type 2) ([place]) 09/29/2011   HTN (hypertension) 09/29/2011   Pneumonia, bacterial 08/07/2010   Incidental lung nodule, > 4mm and < 26mm 08/07/2010   Bronchitis 08/07/2010   Past Medical History:  Diagnosis Date   ALLERGIC RHINITIS    Allergy to IVP dye    Coronary artery disease    Diabetes mellitus, type 2 ([place])    History of tobacco abuse    Quit 2011   Hypertension    Hypertriglyceridemia    Hypothyroidism    NSTEMI (non-ST elevated myocardial infarction) ([place]) 09/30/2011   NO CAD by cath, ? coronary vasospasm   Obesity    Sleep apnea     Family History  Problem Relation Age of Onset   Heart disease Mother        has pacemaker   Diabetes Mother    Diabetes Other        strong family history   Cancer Maternal Uncle    Hypertension Brother    Diabetes Brother    Hypertension Brother     Past Surgical History:  Procedure Laterality Date   CORONARY STENT INTERVENTION N/A 08/19/2017   Procedure:  CORONARY STENT INTERVENTION;  Surgeon: [name], MD;  Location: [place] CV LAB;  Service: Cardiovascular;  Laterality: N/A;   LEFT HEART CATH AND CORONARY ANGIOGRAPHY N/A 08/19/2017   Procedure: LEFT HEART CATH AND CORONARY ANGIOGRAPHY;  Surgeon: [name], MD;  Location: [place] [facility];  Service: Cardiovascular;  Laterality: N/A;   LEFT HEART CATHETERIZATION WITH CORONARY ANGIOGRAM N/A 09/30/2011   Procedure: LEFT HEART CATHETERIZATION WITH CORONARY ANGIOGRAM;  Surgeon: Burnell Blanks, MD;  Location: [facility] CATH LAB;  Service: Cardiovascular;  Laterality: N/A;   TUBAL LIGATION     VESICOVAGINAL FISTULA CLOSURE W/ TAH     Social History   Occupational History   Occupation: Retired  Tobacco Use   Smoking status: Former     Packs/day: 0.80    Years: 36.00    Pack years: 28.80    Types: Cigarettes    Quit date: 02/05/2011    Years since quitting: 9.9   Smokeless tobacco: Never  Vaping Use   Vaping Use: Never used  Substance and Sexual Activity   Alcohol use: Yes    Comment: VERY RARE   Drug use: No   Sexual activity: Not on file

## 2021-01-08 NOTE — Patient Instructions (Signed)
  Plan: Avoid frequent bending and stooping  No lifting greater than 10 lbs. May use ice or moist heat for pain. Weight loss is of benefit. Gabapentin for nerve irritation 100 mg po at night Exercise is important to improve your indurance and does allow people to function better inspite of back pain. CT scan of lumbar spine to assess the position and aligment of the hardware and to assess for any nerve compression.

## 2021-01-15 DIAGNOSIS — N1832 Chronic kidney disease, stage 3b: Secondary | ICD-10-CM | POA: Diagnosis not present

## 2021-01-15 DIAGNOSIS — E1122 Type 2 diabetes mellitus with diabetic chronic kidney disease: Secondary | ICD-10-CM | POA: Diagnosis not present

## 2021-01-15 DIAGNOSIS — M109 Gout, unspecified: Secondary | ICD-10-CM | POA: Diagnosis not present

## 2021-01-15 DIAGNOSIS — I129 Hypertensive chronic kidney disease with stage 1 through stage 4 chronic kidney disease, or unspecified chronic kidney disease: Secondary | ICD-10-CM | POA: Diagnosis not present

## 2021-01-15 DIAGNOSIS — E559 Vitamin D deficiency, unspecified: Secondary | ICD-10-CM | POA: Diagnosis not present

## 2021-01-17 ENCOUNTER — Other Ambulatory Visit: Payer: Self-pay | Admitting: Radiology

## 2021-01-17 MED ORDER — METHOCARBAMOL 500 MG PO TABS: 500.0000 mg | ORAL_TABLET | Freq: Four times a day (QID) | ORAL | 1 refills | Status: DC | PRN

## 2021-02-01 ENCOUNTER — Ambulatory Visit
Admission: RE | Admit: 2021-02-01 | Discharge: 2021-02-01 | Disposition: A | Discharge status: Home / Self Care | Payer: Medicare HMO | Source: Ambulatory Visit | Attending: Specialist | Admitting: Specialist

## 2021-02-01 ENCOUNTER — Other Ambulatory Visit: Payer: Self-pay

## 2021-02-01 DIAGNOSIS — M5416 Radiculopathy, lumbar region: Secondary | ICD-10-CM | POA: Diagnosis not present

## 2021-02-01 DIAGNOSIS — M4327 Fusion of spine, lumbosacral region: Secondary | ICD-10-CM | POA: Diagnosis not present

## 2021-02-01 DIAGNOSIS — Z4889 Encounter for other specified surgical aftercare: Secondary | ICD-10-CM

## 2021-02-08 ENCOUNTER — Encounter: Payer: Self-pay | Admitting: Specialist

## 2021-02-08 ENCOUNTER — Ambulatory Visit (INDEPENDENT_AMBULATORY_CARE_PROVIDER_SITE_OTHER): Payer: Medicare HMO | Admitting: Specialist

## 2021-02-08 ENCOUNTER — Ambulatory Visit: Payer: Self-pay

## 2021-02-08 ENCOUNTER — Other Ambulatory Visit: Payer: Self-pay

## 2021-02-08 VITALS — BP 148/76 | HR 80 | Ht 68.0 in | Wt 296.0 lb

## 2021-02-08 DIAGNOSIS — M5416 Radiculopathy, lumbar region: Secondary | ICD-10-CM

## 2021-02-08 DIAGNOSIS — M7989 Other specified soft tissue disorders: Secondary | ICD-10-CM

## 2021-02-08 DIAGNOSIS — M48062 Spinal stenosis, lumbar region with neurogenic claudication: Secondary | ICD-10-CM

## 2021-02-08 DIAGNOSIS — Z981 Arthrodesis status: Secondary | ICD-10-CM

## 2021-02-08 MED ORDER — GABAPENTIN 100 MG PO CAPS: 200.0000 mg | ORAL_CAPSULE | Freq: Three times a day (TID) | ORAL | 2 refills | Status: DC

## 2021-02-08 NOTE — Patient Instructions (Signed)
Avoid frequent bending and stooping  No lifting greater than 10 lbs. May use ice or moist heat for pain. Weight loss is of benefit. Will increase gabapentin 200 mg tid Neurology  eval with EMG/NCV assess for radiculitis Avoid narcotics and muscle relaxers as these do not help if it is nerve irritation Doppler studies right leg to assess for possible DVT. Exercise is important to improve your indurance and does allow people to function better inspite of back pain.

## 2021-02-08 NOTE — Progress Notes (Signed)
Post-Op Visit Note   Patient: [name]           Date of Birth: August 07, 1960           MRN: [id number] Visit Date: 02/08/2021 PCP: Benito Mccreedy, MD   Assessment & Plan: 10 weeks post op right L4-5 TLIF, right lumbar radiculopathy, Persisting pain right L4 distribution.  Right anterior thigh pain and locking sensation, numbness left anterior thight and calf. RIght leg motor intact has swelling right thigh 1/2" and right calf  1", SLR normal . Using a cane for ambulation, she feels like the cane gives her balance.  CT scan 02/01/2021, no hardware or graft abnormalities,  Chief Complaint:  Chief Complaint  Patient presents with   Lower Back - Routine Post Op   Visit Diagnoses:  1. S/P lumbar fusion   2. Spinal stenosis of lumbar region with neurogenic claudication   3. Radiculopathy, lumbar region   4. Right leg swelling       Plan: Avoid frequent bending and stooping  No lifting greater than 10 lbs. May use ice or moist heat for pain. Weight loss is of benefit. Will increase gabapentin 200 mg tid Neurology  eval with EMG/NCV assess for radiculitis Avoid narcotics and muscle relaxers as these do not help if it is nerve irritation Doppler studies right leg to assess for possible DVT. Exercise is important to improve your indurance and does allow people to function better inspite of back pain.  Follow-Up Instructions: No follow-ups on file.   Orders:  Orders Placed This Encounter  Procedures   XR Lumbar Spine 2-3 Views   No orders of the defined types were placed in this encounter.   Imaging: No results found.  PMFS History: Patient Active Problem List   Diagnosis Date Noted   Spondylolisthesis, lumbar region 11/27/2020    Priority: High    Class: Chronic   Spinal stenosis, lumbar region with neurogenic claudication 11/27/2020    Priority: High    Class: Chronic   Fusion of spine of lumbar region 11/27/2020   Severe obstructive sleep apnea  11/09/2018   Healthcare maintenance 11/09/2018   At risk for obstructive sleep apnea 04/16/2018   Acquired hypothyroidism 03/17/2018   DOE (dyspnea on exertion) 03/16/2018   NSTEMI (non-ST elevated myocardial infarction) ([place]) 08/18/2017   Right sided sciatica 07/10/2017   Right low back pain 07/10/2017   Chest pain 09/29/2011   Morbid obesity ([place]) 09/29/2011   DM type 2 (diabetes mellitus, type 2) ([place]) 09/29/2011   HTN (hypertension) 09/29/2011   Pneumonia, bacterial 08/07/2010   Incidental lung nodule, > 64mm and < 29mm 08/07/2010   Bronchitis 08/07/2010   Past Medical History:  Diagnosis Date   ALLERGIC RHINITIS    Allergy to IVP dye    Coronary artery disease    Diabetes mellitus, type 2 ([place])    History of tobacco abuse    Quit 2011   Hypertension    Hypertriglyceridemia    Hypothyroidism    NSTEMI (non-ST elevated myocardial infarction) ([place]) 09/30/2011   NO CAD by cath, ? coronary vasospasm   Obesity    Sleep apnea     Family History  Problem Relation Age of Onset   Heart disease Mother        has pacemaker   Diabetes Mother    Diabetes Other        strong family history   Cancer Maternal Uncle    Hypertension Brother    Diabetes Brother  Hypertension Brother     Past Surgical History:  Procedure Laterality Date   CORONARY STENT INTERVENTION N/A 08/19/2017   Procedure: CORONARY STENT INTERVENTION;  Surgeon: [name], MD;  Location: [place] [facility];  Service: Cardiovascular;  Laterality: N/A;   LEFT HEART CATH AND CORONARY ANGIOGRAPHY N/A 08/19/2017   Procedure: LEFT HEART CATH AND CORONARY ANGIOGRAPHY;  Surgeon: [name], MD;  Location: [place] [facility];  Service: Cardiovascular;  Laterality: N/A;   LEFT HEART CATHETERIZATION WITH CORONARY ANGIOGRAM N/A 09/30/2011   Procedure: LEFT HEART CATHETERIZATION WITH CORONARY ANGIOGRAM;  Surgeon: Burnell Blanks, MD;  Location: [facility] C[facility];  Service: Cardiovascular;  Laterality: N/A;   TUBAL LIGATION      VESICOVAGINAL FISTULA CLOSURE W/ TAH     Social History   Occupational History   Occupation: Retired  Tobacco Use   Smoking status: Former    Packs/day: 0.80    Years: 36.00    Pack years: 28.80    Types: Cigarettes    Quit date: 02/05/2011    Years since quitting: 10.0   Smokeless tobacco: Never  Vaping Use   Vaping Use: Never used  Substance and Sexual Activity   Alcohol use: Yes    Comment: VERY RARE   Drug use: No   Sexual activity: Not on file

## 2021-02-13 ENCOUNTER — Ambulatory Visit (HOSPITAL_COMMUNITY)
Admission: RE | Admit: 2021-02-13 | Discharge: 2021-02-13 | Disposition: A | Discharge status: Home / Self Care | Payer: Medicare HMO | Source: Ambulatory Visit | Attending: Specialist | Admitting: Specialist

## 2021-02-13 ENCOUNTER — Other Ambulatory Visit: Payer: Self-pay

## 2021-02-13 DIAGNOSIS — M7989 Other specified soft tissue disorders: Secondary | ICD-10-CM

## 2021-02-13 DIAGNOSIS — M5416 Radiculopathy, lumbar region: Secondary | ICD-10-CM | POA: Diagnosis not present

## 2021-02-13 NOTE — Progress Notes (Signed)
Lower extremity venous has been completed.   Preliminary results in CV Proc.   [name] 02/13/2021 1:03 PM

## 2021-02-16 NOTE — Progress Notes (Signed)
No blood clot or DVT

## 2021-02-21 ENCOUNTER — Ambulatory Visit (INDEPENDENT_AMBULATORY_CARE_PROVIDER_SITE_OTHER): Payer: Medicare HMO | Admitting: Neurology

## 2021-02-21 ENCOUNTER — Other Ambulatory Visit: Payer: Self-pay

## 2021-02-21 ENCOUNTER — Encounter: Payer: Self-pay | Admitting: Neurology

## 2021-02-21 VITALS — BP 132/72 | HR 78 | Ht 68.0 in | Wt 297.0 lb

## 2021-02-21 DIAGNOSIS — R29898 Other symptoms and signs involving the musculoskeletal system: Secondary | ICD-10-CM

## 2021-02-21 DIAGNOSIS — M79604 Pain in right leg: Secondary | ICD-10-CM

## 2021-02-21 DIAGNOSIS — Z981 Arthrodesis status: Secondary | ICD-10-CM

## 2021-02-21 MED ORDER — GABAPENTIN 300 MG PO CAPS: ORAL_CAPSULE | ORAL | 5 refills | Status: DC

## 2021-02-21 NOTE — Progress Notes (Signed)
[facility]  PATIENT: RYN PEINE DOB: 1960/12/06  REFERRING DOCTOR OR PCP: Basil Dess MD; Dr. Vista Lawman MD (PCP) SOURCE: Patient, notes from orthopedics, imaging reports, CT and MRI images personally reviewed.  _________________________________   HISTORICAL  CHIEF COMPLAINT:  Chief Complaint  Patient presents with   New Patient (Initial Visit)    Pt alone , rm 2 Back is no longer hurting. Right leg remains weak and is swollen more than the left. This has been ongoing for year. She has decreased sensation in the RLE. She c/o numbness/burning from thigh to ankle. She had back surgery oct 2022. She was started on gabapentin and it helps to take edge off    HISTORY OF PRESENT ILLNESS:  At the pleasure of seeing the patient, Lyndzie Zentz, at [facility] for neurologic consultation regarding her right leg pain and weakness.  She is a 61 year old woman with a prior history of back issues who now is reporting issues with right leg pain.   Initially, she was having back pain greater than leg pain and the leg pain was worse on the left.  MRI of the lumbar spine had shown a sequestered disc fragment at L4-L5 on the left, multilevel facet hypertrophy and other degenerative change.  She had lumbar surgery and back pain improved.   She felt that the surgery helped the back pain tremendously but has not helped the leg pain and the right leg pain has actually worsened over the last 2 months.  She also noted swelling of the right leg.  The worst pain is in the right thigh.   Laying down increases the numbness and pain.   Due to swelling, she had a doppler study but she reports that there were no clots (I could not find the actual study).    While sitting or laying down pain is only in the right anterolateral thigh but pain will go further down the leg upon laying flat or standing.     The right leg is weaker and she uses a cane.  Specifically, she has  trouble with hip flexion and she needs to pull her leg up to get into the car.  The leg has collapsed on her a few times while standing up.  She is using a cane now.  She is taking gabapentin 200 mg po tid.      she does not think it has helped her that much.  She tolerates it well.  I personally reviewed the CT scan from 02/01/2021 and MRI of the lumbar spine from 01/18/2020.  The CT is postoperative and shows the L4-L5 fusion.  Disc protrusions are noted at L2-L3 and L3-L4 causing some foraminal narrowing but no definite nerve root compression.   The MRI of the lumbar spine from 2021 appear to show less degenerative change at these levels and did show the sequestered disc fragment at L4-L5 that led to the decision for fusion surgery.  Imaging: CT scan lumbar 02/01/2021 shows: Postsurgical changes of L4-L5 posterior decompression and fusion. Intact hardware without evidence of loosening. Incomplete bone graft incorporation in the disc space. Postsurgical changes in the posterior paraspinal soft tissues with thin fluid collection measuring 9 mm short axis.   Bilateral facet spurring at L4-L5, and small bony fragments noted within the floor of the right neural foramen which does not appear to cause significant stenosis. Mild left-sided neural foraminal narrowing at this level. No definitive impingement by CT. Patent spinal canal.  Additional  multilevel disc bulging above below the fusion, similar appearance to prior MRI with mild neural foraminal narrowing at L2-L3 and L3-L4.  MRI lumbar spine 01/25/2020 showed: IMPRESSION: Multilevel spondylosis. 7 mm left L4-5 subarticular lesion may reflect a free disc fragment versus focal left facet hypertrophy grazing the descending left L5 nerve root. Mild spinal canal and mild-to-moderate neural foraminal narrowing at this level.    Mild right L2-3 and bilateral L3-4 neural foraminal narrowing.  MRI lumbar 04/02/2018 showed L4-5 facet arthropathy with 3  mm of anterolisthesis. Some facet joint edema. This could be a cause of back pain or referred facet syndrome pain. Mild narrowing of the subarticular lateral recesses. but without distinct neural compression.   L5-S1 central disc bulge but without neural compression. Bilateral facet osteoarthritis which could be a cause of back pain or referred facet syndrome pain.  REVIEW OF SYSTEMS: Constitutional: No fevers, chills, sweats, or change in appetite Eyes: No visual changes, double vision, eye pain Ear, nose and throat: No hearing loss, ear pain, nasal congestion, sore throat Cardiovascular: No chest pain, palpitations Respiratory:  No shortness of breath at rest or with exertion.   No wheezes GastrointestinaI: No nausea, vomiting, diarrhea, abdominal pain, fecal incontinence Genitourinary:  No dysuria, urinary retention or frequency.  No nocturia. Musculoskeletal:  No neck pain, back pain Integumentary: No rash, pruritus, skin lesions Neurological: as above Psychiatric: No depression at this time.  No anxiety Endocrine: No palpitations, diaphoresis, change in appetite, change in weigh or increased thirst Hematologic/Lymphatic:  No anemia, purpura, petechiae. Allergic/Immunologic: No itchy/runny eyes, nasal congestion, recent allergic reactions, rashes  ALLERGIES: Allergies  Allergen Reactions   Contrast Media [Iodinated Contrast Media] Swelling    Skin Peeling and break out   Codeine Itching   Other     NO PLASTIC TAPE - Blisters and skin removal    HOME MEDICATIONS:  Current Outpatient Medications:    aspirin EC 81 MG tablet, Take 81 mg by mouth daily., Disp: , Rfl:    gabapentin (NEURONTIN) 100 MG capsule, Take 2 capsules (200 mg total) by mouth 3 (three) times daily., Disp: 120 capsule, Rfl: 2   isosorbide dinitrate (ISORDIL) 30 MG tablet, Take 30 mg by mouth 2 (two) times daily., Disp: , Rfl:    LANTUS SOLOSTAR 100 UNIT/ML Solostar Pen, Inject 15 Units into the skin at  bedtime., Disp: , Rfl:    levothyroxine (SYNTHROID) 150 MCG tablet, Take 150 mcg by mouth daily before breakfast., Disp: , Rfl:    metoprolol succinate (TOPROL-XL) 50 MG 24 hr tablet, Take 50 mg by mouth daily., Disp: , Rfl:    MITIGARE 0.6 MG CAPS, Take 0.6 mg by mouth every other day., Disp: , Rfl:    nitroGLYCERIN (NITROSTAT) 0.4 MG SL tablet, Place 1 tablet (0.4 mg total) under the tongue every 5 (five) minutes as needed for chest pain (up to 3 doses)., Disp: 25 tablet, Rfl: 3   NOVOLOG FLEXPEN 100 UNIT/ML FlexPen, Inject 15-18 Units into the skin 3 (three) times daily with meals., Disp: , Rfl:    rosuvastatin (CRESTOR) 40 MG tablet, Take 40 mg by mouth daily., Disp: , Rfl:    traZODone (DESYREL) 50 MG tablet, Take 50 mg by mouth at bedtime as needed for sleep., Disp: , Rfl:    TRULICITY 1.5 TM/2.1RZ SOPN, Inject 1.5 mg into the skin every Friday., Disp: , Rfl:   PAST MEDICAL HISTORY: Past Medical History:  Diagnosis Date   ALLERGIC RHINITIS    Allergy to IVP  dye    Coronary artery disease    Diabetes mellitus, type 2 ([place])    History of tobacco abuse    Quit 2011   Hypertension    Hypertriglyceridemia    Hypothyroidism    NSTEMI (non-ST elevated myocardial infarction) ([place]) 09/30/2011   NO CAD by cath, ? coronary vasospasm   Obesity    Sleep apnea     PAST SURGICAL HISTORY: Past Surgical History:  Procedure Laterality Date   CORONARY STENT INTERVENTION N/A 08/19/2017   Procedure: CORONARY STENT INTERVENTION;  Surgeon: [name], MD;  Location: [place] [facility];  Service: Cardiovascular;  Laterality: N/A;   LEFT HEART CATH AND CORONARY ANGIOGRAPHY N/A 08/19/2017   Procedure: LEFT HEART CATH AND CORONARY ANGIOGRAPHY;  Surgeon: [name], MD;  Location: [place] [facility];  Service: Cardiovascular;  Laterality: N/A;   LEFT HEART CATHETERIZATION WITH CORONARY ANGIOGRAM N/A 09/30/2011   Procedure: LEFT HEART CATHETERIZATION WITH CORONARY ANGIOGRAM;  Surgeon: Burnell Blanks, MD;  Location: [facility] C[facility];  Service: Cardiovascular;  Laterality: N/A;   LUMBAR DISC SURGERY  11/27/2020   L4-5   TUBAL LIGATION     VESICOVAGINAL FISTULA CLOSURE W/ TAH      FAMILY HISTORY: Family History  Problem Relation Age of Onset   Heart disease Mother        has pacemaker   Diabetes Mother    Diabetes Other        strong family history   Cancer Maternal Uncle    Hypertension Brother    Diabetes Brother    Hypertension Brother     SOCIAL HISTORY:  Social History   Socioeconomic History   Marital status: Divorced    Spouse name: Not on file   Number of children: 3   Years of education: Not on file   Highest education level: Not on file  Occupational History   Occupation: Retired  Tobacco Use   Smoking status: Former    Packs/day: 0.80    Years: 36.00    Pack years: 28.80    Types: Cigarettes    Quit date: 02/05/2011    Years since quitting: 10.0   Smokeless tobacco: Never  Vaping Use   Vaping Use: Never used  Substance and Sexual Activity   Alcohol use: Yes    Comment: VERY RARE   Drug use: No   Sexual activity: Not on file  Other Topics Concern   Not on file  Social History Narrative   Not on file   Social Determinants of Health   Financial Resource Strain: Not on file  Food Insecurity: Not on file  Transportation Needs: Not on file  Physical Activity: Not on file  Stress: Not on file  Social Connections: Not on file  Intimate Partner Violence: Not on file     PHYSICAL EXAM  Vitals:   02/21/21 1257  BP: 132/72  Pulse: 78  Weight: 297 lb (134.7 kg)  Height: 5\' 8"  (1.727 m)    Body mass index is 45.16 kg/m.   General: The patient is well-developed and well-nourished and in no acute distress  HEENT:  Head is [place]/AT.  Sclera are anicteric.    Neck: No carotid bruits are noted.  The neck is nontender.  Cardiovascular: The heart has a regular rate and rhythm with a normal S1 and S2. There were no murmurs, gallops or rubs.     Skin: Extremities are without rash or  edema.  Musculoskeletal:  Back is nontender  Neurologic  Exam  Mental status: The patient is alert and oriented x 3 at the time of the examination. The patient has apparent normal recent and remote memory, with an apparently normal attention span and concentration ability.   Speech is normal.  Cranial nerves: Extraocular movements are full.  Facial strength and sensation was normal.. No obvious hearing deficits are noted.  Motor:  Muscle bulk is normal.   Tone is normal. Strength is  3 to 4-/5 in the right iliopsoas, 4+/5 in right toe ankle extension and 5 / 5 in all 4 extremities.   Sensory: Sensory testing is intact in arms and left leg but reduced in anterolateral thigh and L5 distribution of lower leg.  Coordination: Cerebellar testing reveals good finger-nose-finger and heel-to-shin bilaterally.  Gait and station: Station is normal.   Gait is normal. Tandem gait is normal. Romberg is negative.   Reflexes: Deep tendon reflexes are symmetric and normal bilaterally.   Plantar responses are flexor.    DIAGNOSTIC DATA (LABS, IMAGING, TESTING) - I reviewed patient records, labs, notes, testing and imaging myself where available.  Lab Results  Component Value Date   WBC 14.4 (H) 11/28/2020   HGB 10.2 (L) 11/28/2020   HCT 32.3 (L) 11/28/2020   MCV 90.7 11/28/2020   PLT 286 11/28/2020      Component Value Date/Time   NA 138 11/28/2020 0500   K 4.0 11/28/2020 0500   CL 106 11/28/2020 0500   CO2 20 (L) 11/28/2020 0500   GLUCOSE 180 (H) 11/28/2020 0500   BUN 18 11/28/2020 0500   CREATININE 1.24 (H) 11/28/2020 0500   CALCIUM 8.6 (L) 11/28/2020 0500   PROT 6.5 11/28/2020 0500   ALBUMIN 3.3 (L) 11/28/2020 0500   AST 21 11/28/2020 0500   ALT 26 11/28/2020 0500   ALKPHOS 50 11/28/2020 0500   BILITOT 1.2 11/28/2020 0500   GFRNONAA 50 (L) 11/28/2020 0500   GFRAA 59 (L) 03/10/2018 1846       ASSESSMENT AND PLAN  Right leg weakness -  Plan: MR Lumbar Spine W Wo Contrast  History of lumbar fusion - Plan: MR Lumbar Spine W Wo Contrast  Right leg pain - Plan: MR Lumbar Spine W Wo Contrast   In summary, Ms. Rogacki is a 61 year old woman with weakness in the proximal right leg and pain in the anterolateral right thigh.  On exam, she had 4-/5 strength in the right hip flexors but 5/5 strength in the quadriceps.  Strength was 4+/5 in some of the L5 innervated muscles and she also had reduced sensation in the anterolateral right thigh and the right L5 distribution of the foot.  The L5 distribution symptoms could be sequela of her disc herniation.  I am concerned that current symptoms would best be explained by either an L3-4 may be L4 radiculopathy.  A lumbar plexus injury might also lead to these symptoms.  The CT scan performed recently did not show retroperitoneal changes near the spine but does not completely cover the plexus.  Because the quadriceps are strong, a femoral neuropathy would be less likely.  We will check an MRI of the lumbar spine.  If this does not show the etiology of her symptoms, I will also order MRI of the right lumbar plexus and we will check NCV/EMG.  To help with her symptoms, I will increase her gabapentin from 400 mg a day to 1200 mg a day and consider further increase depending on response.  Thank you for asked me to see  Ms. Orbach.  Please let me know if I can be of further assistance with her or other patients in the future.  Smrithi Pigford A. Felecia Shelling, MD, [facility] 9/99/6722, 7:73 PM Certified in Neurology, Clinical Neurophysiology, Sleep Medicine and Neuroimaging  [facility] [address], [place], [place] [address] [phone]

## 2021-02-22 ENCOUNTER — Telehealth: Payer: Self-pay | Admitting: Neurology

## 2021-02-22 NOTE — Telephone Encounter (Signed)
[name]: [id number] (exp. 02/22/21 to 03/24/21) order sent to GI, they will reach out to the patient to schedule.

## 2021-02-26 ENCOUNTER — Other Ambulatory Visit: Payer: Self-pay | Admitting: *Deleted

## 2021-02-26 DIAGNOSIS — Z981 Arthrodesis status: Secondary | ICD-10-CM

## 2021-02-26 DIAGNOSIS — M79604 Pain in right leg: Secondary | ICD-10-CM

## 2021-02-26 DIAGNOSIS — R29898 Other symptoms and signs involving the musculoskeletal system: Secondary | ICD-10-CM

## 2021-02-26 NOTE — Telephone Encounter (Signed)
Noted, thank you! Order sent to GI, they will reach out to the patient to schedule.

## 2021-02-26 NOTE — Telephone Encounter (Signed)
New order placed

## 2021-02-26 NOTE — Telephone Encounter (Signed)
Patient left a voicemail on my phone stating she is allergic to the contrast and would like a new order put in without contrast.

## 2021-02-28 ENCOUNTER — Encounter: Payer: Self-pay | Admitting: Specialist

## 2021-02-28 ENCOUNTER — Ambulatory Visit (INDEPENDENT_AMBULATORY_CARE_PROVIDER_SITE_OTHER): Payer: Medicare HMO | Admitting: Specialist

## 2021-02-28 ENCOUNTER — Ambulatory Visit (INDEPENDENT_AMBULATORY_CARE_PROVIDER_SITE_OTHER): Payer: Medicare HMO

## 2021-02-28 ENCOUNTER — Other Ambulatory Visit: Payer: Self-pay

## 2021-02-28 VITALS — BP 135/72 | HR 67 | Ht 68.0 in | Wt 297.0 lb

## 2021-02-28 DIAGNOSIS — Z981 Arthrodesis status: Secondary | ICD-10-CM | POA: Diagnosis not present

## 2021-02-28 DIAGNOSIS — M1611 Unilateral primary osteoarthritis, right hip: Secondary | ICD-10-CM | POA: Diagnosis not present

## 2021-02-28 DIAGNOSIS — M5416 Radiculopathy, lumbar region: Secondary | ICD-10-CM

## 2021-02-28 DIAGNOSIS — M25559 Pain in unspecified hip: Secondary | ICD-10-CM | POA: Diagnosis not present

## 2021-02-28 NOTE — Progress Notes (Signed)
Post-Op Visit Note   Patient: [name]           Date of Birth: Oct 11, 1960           MRN: [id number] Visit Date: 02/28/2021 PCP: Benito Mccreedy, MD   Assessment & Plan: Post op L4-5 TLIF  13.5 weeks  Chief Complaint:  Chief Complaint  Patient presents with   Lower Back - Follow-up   Right Leg - Follow-up  Incision is healed. Motor is normal  Right leg raise is painful  Visit Diagnoses:  1. S/P lumbar fusion   2. Hip pain   3. Radiculopathy, lumbar region   4. Unilateral primary osteoarthritis, right hip     Plan: Avoid bending, stooping and avoid lifting weights greater than 10 lbs. Avoid prolong standing and walking. Avoid frequent bending and stooping  No lifting greater than 10 lbs. May use ice or moist heat for pain. Weight loss is of benefit. Handicap license is approved. Using a cane on the left side when bearing weight on the right hip is able to reduce The stress and some pain.  Referral to Dr. Ninfa Linden for OA right hip and consideration of right THR.  The numbness in the right leg is due to the nerve changes from pinch and the surgery and should improve But it takes time 1-2 years for the nerve to regenerate, Vitamin B6 and B12 and thiamine and fish oil are building blocks for Nerve recovery.   Follow-Up Instructions: Return in about 6 weeks (around 04/11/2021).   Orders:  Orders Placed This Encounter  Procedures   XR Lumbar Spine 2-3 Views   No orders of the defined types were placed in this encounter.   Imaging: XR Lumbar Spine 2-3 Views  Result Date: 02/28/2021 AP and lateral flexion and extension radiographs show 25mm of motion at the L4-5 TLIF site, no loosening of hardware Fusion is incorporating and may take up to one year to become solid.  Right hip with severe arthritis changes.   PMFS History: Patient Active Problem List   Diagnosis Date Noted   Spondylolisthesis, lumbar region 11/27/2020    Priority: High    Class: Chronic    Spinal stenosis, lumbar region with neurogenic claudication 11/27/2020    Priority: High    Class: Chronic   Fusion of spine of lumbar region 11/27/2020   Severe obstructive sleep apnea 11/09/2018   Healthcare maintenance 11/09/2018   At risk for obstructive sleep apnea 04/16/2018   Acquired hypothyroidism 03/17/2018   DOE (dyspnea on exertion) 03/16/2018   NSTEMI (non-ST elevated myocardial infarction) ([place]) 08/18/2017   Right sided sciatica 07/10/2017   Right low back pain 07/10/2017   Chest pain 09/29/2011   Morbid obesity ([place]) 09/29/2011   DM type 2 (diabetes mellitus, type 2) ([employer]) 09/29/2011   HTN (hypertension) 09/29/2011   Pneumonia, bacterial 08/07/2010   Incidental lung nodule, > 36mm and < 45mm 08/07/2010   Bronchitis 08/07/2010   Past Medical History:  Diagnosis Date   ALLERGIC RHINITIS    Allergy to IVP dye    Coronary artery disease    Diabetes mellitus, type 2 ([place])    History of tobacco abuse    Quit 2011   Hypertension    Hypertriglyceridemia    Hypothyroidism    NSTEMI (non-ST elevated myocardial infarction) ([place]) 09/30/2011   NO CAD by cath, ? coronary vasospasm   Obesity    Sleep apnea     Family History  Problem Relation Age of Onset  Heart disease Mother        has pacemaker   Diabetes Mother    Diabetes Other        strong family history   Cancer Maternal Uncle    Hypertension Brother    Diabetes Brother    Hypertension Brother     Past Surgical History:  Procedure Laterality Date   CORONARY STENT INTERVENTION N/A 08/19/2017   Procedure: CORONARY STENT INTERVENTION;  Surgeon: [name], MD;  Location: [place] [facility];  Service: Cardiovascular;  Laterality: N/A;   LEFT HEART CATH AND CORONARY ANGIOGRAPHY N/A 08/19/2017   Procedure: LEFT HEART CATH AND CORONARY ANGIOGRAPHY;  Surgeon: [name], MD;  Location: [place] [facility];  Service: Cardiovascular;  Laterality: N/A;   LEFT HEART CATHETERIZATION WITH CORONARY ANGIOGRAM N/A  09/30/2011   Procedure: LEFT HEART CATHETERIZATION WITH CORONARY ANGIOGRAM;  Surgeon: Burnell Blanks, MD;  Location: [facility] CATH LAB;  Service: Cardiovascular;  Laterality: N/A;   LUMBAR DISC SURGERY  11/27/2020   L4-5   TUBAL LIGATION     VESICOVAGINAL FISTULA CLOSURE W/ TAH     Social History   Occupational History   Occupation: Retired  Tobacco Use   Smoking status: Former    Packs/day: 0.80    Years: 36.00    Pack years: 28.80    Types: Cigarettes    Quit date: 02/05/2011    Years since quitting: 10.0   Smokeless tobacco: Never  Vaping Use   Vaping Use: Never used  Substance and Sexual Activity   Alcohol use: Yes    Comment: VERY RARE   Drug use: No   Sexual activity: Not on file

## 2021-02-28 NOTE — Patient Instructions (Addendum)
Avoid bending, stooping and avoid lifting weights greater than 10 lbs. Avoid prolong standing and walking. Avoid frequent bending and stooping  No lifting greater than 10 lbs. May use ice or moist heat for pain. Weight loss is of benefit. Handicap license is approved. Using a cane on the left side when bearing weight on the right hip is able to reduce The stress and some pain.   Referral to Dr. Ninfa Linden for OA right hip and consideration of right THR.  The numbness in the right leg is due to the nerve changes from pinch and the surgery and should improve But it takes time 1-2 years for the nerve to regenerate, Vitamin B6 and B12 and thiamine and fish oil are building blocks for Nerve recovery.

## 2021-03-05 ENCOUNTER — Ambulatory Visit (INDEPENDENT_AMBULATORY_CARE_PROVIDER_SITE_OTHER): Payer: Medicare HMO | Admitting: Physician Assistant

## 2021-03-05 ENCOUNTER — Encounter: Payer: Self-pay | Admitting: Physician Assistant

## 2021-03-05 ENCOUNTER — Ambulatory Visit: Payer: Self-pay

## 2021-03-05 DIAGNOSIS — M25551 Pain in right hip: Secondary | ICD-10-CM | POA: Diagnosis not present

## 2021-03-05 NOTE — Progress Notes (Signed)
Office Visit Note   Patient: [name]           Date of Birth: 04-01-1960           MRN: [id number] Visit Date: 03/05/2021              Requested by: Benito Mccreedy, MD [address] [place],  [place] [address] PCP: Benito Mccreedy, MD   Assessment & Plan: Visit Diagnoses:  1. Pain in right hip     Plan: Discussed with patient that she will need to lose weight prior to undergoing surgery..  Currently her BMI is 45.16.  She will need to be around 260 pounds to have a BMI that is acceptable for surgery.  She would like to work on this with continued diet and exercise.  See her back in 3 months for height and weight.  She will continue to keep her glucose under good control.  Questions were encouraged and answered.  Also discussed with her possible intra-articular injection of the right hip she will let us know if she would like to proceed with this.  Follow-Up Instructions: Return in about 3 months (around 06/03/2021).   Orders:  Orders Placed This Encounter  Procedures   XR HIP UNILAT W OR W/O PELVIS 2-3 VIEWS RIGHT   No orders of the defined types were placed in this encounter.     Procedures: No procedures performed   Clinical Data: No additional findings.   Subjective: Chief Complaint  Patient presents with   Right Hip - Pain    HPI Mrs. [name] is a 61 year old female with known right hip osteoarthritis.  She states she is having continued right groin pain.  She is had no new injury.  She is diabetic.  Reports that her hemoglobin A1c last 7.5.  She is having difficulty exercise and lose weight.  Prior x-rays of her right hip have shown significant joint space narrowing and periarticular spurring. Review of Systems See HPI otherwise negative  Objective: Vital Signs: There were no vitals taken for this visit.  Physical Exam General well-developed well-nourished pleasant female no acute distress ambulates with a cane. Ortho Exam Right  hip good external rotation without pain.  Limited internal rotation with discomfort. Specialty Comments:  No specialty comments available.  Imaging: No results found.   PMFS History: Patient Active Problem List   Diagnosis Date Noted   Spondylolisthesis, lumbar region 11/27/2020    Class: Chronic   Spinal stenosis, lumbar region with neurogenic claudication 11/27/2020    Class: Chronic   Fusion of spine of lumbar region 11/27/2020   Severe obstructive sleep apnea 11/09/2018   Healthcare maintenance 11/09/2018   At risk for obstructive sleep apnea 04/16/2018   Acquired hypothyroidism 03/17/2018   DOE (dyspnea on exertion) 03/16/2018   NSTEMI (non-ST elevated myocardial infarction) ([place]) 08/18/2017   Right sided sciatica 07/10/2017   Right low back pain 07/10/2017   Chest pain 09/29/2011   Morbid obesity ([place]) 09/29/2011   DM type 2 (diabetes mellitus, type 2) ([place]) 09/29/2011   HTN (hypertension) 09/29/2011   Pneumonia, bacterial 08/07/2010   Incidental lung nodule, > 39mm and < 9mm 08/07/2010   Bronchitis 08/07/2010   Past Medical History:  Diagnosis Date   ALLERGIC RHINITIS    Allergy to IVP dye    Coronary artery disease    Diabetes mellitus, type 2 ([place])    History of tobacco abuse    Quit 2011   Hypertension  Hypertriglyceridemia    Hypothyroidism    NSTEMI (non-ST elevated myocardial infarction) ([place]) 09/30/2011   NO CAD by cath, ? coronary vasospasm   Obesity    Sleep apnea     Family History  Problem Relation Age of Onset   Heart disease Mother        has pacemaker   Diabetes Mother    Diabetes Other        strong family history   Cancer Maternal Uncle    Hypertension Brother    Diabetes Brother    Hypertension Brother     Past Surgical History:  Procedure Laterality Date   CORONARY STENT INTERVENTION N/A 08/19/2017   Procedure: CORONARY STENT INTERVENTION;  Surgeon: [name], MD;  Location: [facility];  Service: Cardiovascular;   Laterality: N/A;   LEFT HEART CATH AND CORONARY ANGIOGRAPHY N/A 08/19/2017   Procedure: LEFT HEART CATH AND CORONARY ANGIOGRAPHY;  Surgeon: [name], MD;  Location: [facility];  Service: Cardiovascular;  Laterality: N/A;   LEFT HEART CATHETERIZATION WITH CORONARY ANGIOGRAM N/A 09/30/2011   Procedure: LEFT HEART CATHETERIZATION WITH CORONARY ANGIOGRAM;  Surgeon: Burnell Blanks, MD;  Location: [facility] CATH LAB;  Service: Cardiovascular;  Laterality: N/A;   LUMBAR DISC SURGERY  11/27/2020   L4-5   TUBAL LIGATION     VESICOVAGINAL FISTULA CLOSURE W/ TAH     Social History   Occupational History   Occupation: Retired  Tobacco Use   Smoking status: Former    Packs/day: 0.80    Years: 36.00    Pack years: 28.80    Types: Cigarettes    Quit date: 02/05/2011    Years since quitting: 10.0   Smokeless tobacco: Never  Vaping Use   Vaping Use: Never used  Substance and Sexual Activity   Alcohol use: Yes    Comment: VERY RARE   Drug use: No   Sexual activity: Not on file

## 2021-03-08 ENCOUNTER — Ambulatory Visit
Admission: RE | Admit: 2021-03-08 | Discharge: 2021-03-08 | Disposition: A | Discharge status: Home / Self Care | Payer: Medicare HMO | Source: Ambulatory Visit | Attending: Neurology | Admitting: Neurology

## 2021-03-08 DIAGNOSIS — Z981 Arthrodesis status: Secondary | ICD-10-CM | POA: Diagnosis not present

## 2021-03-08 DIAGNOSIS — M5416 Radiculopathy, lumbar region: Secondary | ICD-10-CM | POA: Diagnosis not present

## 2021-03-08 DIAGNOSIS — M79604 Pain in right leg: Secondary | ICD-10-CM | POA: Diagnosis not present

## 2021-03-08 DIAGNOSIS — R29898 Other symptoms and signs involving the musculoskeletal system: Secondary | ICD-10-CM

## 2021-03-10 ENCOUNTER — Other Ambulatory Visit: Payer: Self-pay | Admitting: Neurology

## 2021-03-10 ENCOUNTER — Encounter: Payer: Self-pay | Admitting: Neurology

## 2021-03-10 DIAGNOSIS — G541 Lumbosacral plexus disorders: Secondary | ICD-10-CM

## 2021-03-10 DIAGNOSIS — R29898 Other symptoms and signs involving the musculoskeletal system: Secondary | ICD-10-CM

## 2021-03-12 ENCOUNTER — Other Ambulatory Visit: Payer: Self-pay | Admitting: *Deleted

## 2021-03-12 DIAGNOSIS — R531 Weakness: Secondary | ICD-10-CM

## 2021-03-12 DIAGNOSIS — M2560 Stiffness of unspecified joint, not elsewhere classified: Secondary | ICD-10-CM

## 2021-03-13 ENCOUNTER — Telehealth: Payer: Self-pay | Admitting: Neurology

## 2021-03-13 NOTE — Telephone Encounter (Signed)
[employer] pending uploaded notes on the portal  

## 2021-03-13 NOTE — Telephone Encounter (Signed)
[name]: [id number] (exp. 03/13/21 to 04/12/21) order sent to GI, they will reach out to the patient to schedule.

## 2021-03-15 ENCOUNTER — Encounter: Payer: Self-pay | Admitting: Internal Medicine

## 2021-03-15 NOTE — Progress Notes (Signed)
Name: [name]  MRN/ DOB: [id number], 20-Dec-1960   Age/ Sex: 61 y.o., female    PCP: Benito Mccreedy, MD   Reason for Endocrinology Evaluation: Type 2 Diabetes Mellitus     Date of Initial Endocrinology Visit: 03/16/2021     PATIENT IDENTIFIER: [name] is a 61 y.o. female with a past medical history of T2DM, HTN, dyslipidemia and CAD (status post PCI). The patient presented for initial endocrinology clinic visit on 03/16/2021 for consultative assistance with her diabetes management.    HPI: [name] was    Diagnosed with DM 2013 Prior Medications tried/Intolerance: Metformin- diarrhea  as well as hand and feet swelling. Ozempic - no  Currently checking blood sugars 4-5 x / day,  .  Hypoglycemia episodes : no                Hemoglobin A1c has ranged from 7.5% in 2022, peaking at 11.5% in 2013. Patient required assistance for hypoglycemia: no Patient has required hospitalization within the last 1 year from hyper or hypoglycemia: no  In terms of diet, the patient eats 3 meals , snacks 3x a day ( fruits and nuts)  No prior hx of pancreatitis    HCTZ was held due to creatinine elevation  Had back sx ( lumbar fusion ) 11/2020. Has right leg pain since 2019     THYROID HISTORY: She has been diagnosed with hypothyroid years ago ~ 2013. No prior neck sx or radiation.   Denies FH of thyroid disease    HOME ENDOCRINE REGIMEN: Lantus 15 units once daily  NovoLog 18 units TID QAC Trulicity 1.5 mg weekly ( Friday )  Levothyroxine 150 mcg daily      Statin: Yes ACE-I/ARB: No Prior Diabetic Education: yes   METER DOWNLOAD SUMMARY:  Range 120-150 mg/dL   DIABETIC COMPLICATIONS: Microvascular complications:  Neuropathy  Denies: retinopathy, CKD  Last eye exam: Completed 12/2021  Macrovascular complications:  CAD(status post PCI)  Denies: PVD, CVA   PAST HISTORY: Past Medical History:  Past Medical History:  Diagnosis Date   ALLERGIC  RHINITIS    Allergy to IVP dye    Coronary artery disease    Diabetes mellitus, type 2 ([place])    History of tobacco abuse    Quit 2011   Hypertension    Hypertriglyceridemia    Hypothyroidism    NSTEMI (non-ST elevated myocardial infarction) ([place]) 09/30/2011   NO CAD by cath, ? coronary vasospasm   Obesity    Sleep apnea    Past Surgical History:  Past Surgical History:  Procedure Laterality Date   CORONARY STENT INTERVENTION N/A 08/19/2017   Procedure: CORONARY STENT INTERVENTION;  Surgeon: [name], MD;  Location: [facility];  Service: Cardiovascular;  Laterality: N/A;   LEFT HEART CATH AND CORONARY ANGIOGRAPHY N/A 08/19/2017   Procedure: LEFT HEART CATH AND CORONARY ANGIOGRAPHY;  Surgeon: [name], MD;  Location: [facility];  Service: Cardiovascular;  Laterality: N/A;   LEFT HEART CATHETERIZATION WITH CORONARY ANGIOGRAM N/A 09/30/2011   Procedure: LEFT HEART CATHETERIZATION WITH CORONARY ANGIOGRAM;  Surgeon: Burnell Blanks, MD;  Location: [facility];  Service: Cardiovascular;  Laterality: N/A;   LUMBAR DISC SURGERY  11/27/2020   L4-5   TUBAL LIGATION     VESICOVAGINAL FISTULA CLOSURE W/ TAH      Social History:  reports that she quit smoking about 10 years ago. Her smoking use included cigarettes. She has a 28.80 pack-year smoking history.  She has never used smokeless tobacco. She reports current alcohol use. She reports that she does not use drugs. Family History:  Family History  Problem Relation Age of Onset   Heart disease Mother        has pacemaker   Diabetes Mother    Diabetes Other        strong family history   Cancer Maternal Uncle    Hypertension Brother    Diabetes Brother    Hypertension Brother      HOME MEDICATIONS: Allergies as of 03/16/2021       Reactions   Contrast Media [iodinated Contrast Media] Swelling   Skin Peeling and break out   Codeine Itching   Other    NO PLASTIC TAPE - Blisters and skin removal         Medication List        Accurate as of March 16, 2021  8:35 AM. If you have any questions, ask your nurse or doctor.          aspirin EC 81 MG tablet Take 81 mg by mouth daily.   Fish Oil 500 MG Caps Take 1 capsule by mouth daily.   gabapentin 300 MG capsule Commonly known as: NEURONTIN One po qAM,one po qPm and two po qHS   isosorbide dinitrate 30 MG tablet Commonly known as: ISORDIL Take 30 mg by mouth 2 (two) times daily.   Lantus SoloStar 100 UNIT/ML Solostar Pen Generic drug: insulin glargine Inject 15 Units into the skin at bedtime.   levothyroxine 150 MCG tablet Commonly known as: SYNTHROID Take 150 mcg by mouth daily before breakfast.   metoprolol succinate 50 MG 24 hr tablet Commonly known as: TOPROL-XL Take 50 mg by mouth daily.   Mitigare 0.6 MG Caps Generic drug: Colchicine Take 0.6 mg by mouth every other day.   nitroGLYCERIN 0.4 MG SL tablet Commonly known as: NITROSTAT Place 1 tablet (0.4 mg total) under the tongue every 5 (five) minutes as needed for chest pain (up to 3 doses).   NovoLOG FlexPen 100 UNIT/ML FlexPen Generic drug: insulin aspart Inject 15-18 Units into the skin 3 (three) times daily with meals.   rosuvastatin 40 MG tablet Commonly known as: CRESTOR Take 40 mg by mouth daily.   traZODone 50 MG tablet Commonly known as: DESYREL Take 50 mg by mouth at bedtime as needed for sleep.   Trulicity 1.5 HT/3.4KA Sopn Generic drug: Dulaglutide Inject 1.5 mg into the skin every Friday.   vitamin D3 50 MCG (2000 UT) Caps Take 1 each by mouth daily.         ALLERGIES: Allergies  Allergen Reactions   Contrast Media [Iodinated Contrast Media] Swelling    Skin Peeling and break out   Codeine Itching   Other     NO PLASTIC TAPE - Blisters and skin removal     REVIEW OF SYSTEMS: A comprehensive ROS was conducted with the patient and is negative except as per HPI and below:  Review of Systems  Gastrointestinal:  Negative  for diarrhea and nausea.  Genitourinary:  Negative for frequency and urgency.  Endo/Heme/Allergies:  Negative for polydipsia.     OBJECTIVE:   VITAL SIGNS: BP 128/70 (BP Location: Left Arm, Patient Position: Sitting, Cuff Size: Normal)    Pulse 74    Ht 5\' 8"  (1.727 m)    Wt (!) 303 lb 3.2 oz (137.5 kg)    SpO2 97%    BMI 46.10 kg/m    PHYSICAL  EXAM:  General: Pt appears well and is in NAD  Neck: General: Supple without adenopathy or carotid bruits. Thyroid: Thyroid size normal.  No goiter or nodules appreciated. No thyroid bruit.  Lungs: Clear with good BS bilat with no rales, rhonchi, or wheezes  Heart: RRR   Extremities:  Lower extremities - No pretibial edema. No lesions.  Neuro: MS is good with appropriate affect, pt is alert and Ox3    DATA REVIEWED:  Lab Results  Component Value Date   HGBA1C 7.5 (H) 11/27/2020   HGBA1C 9.4 (H) 08/18/2017   HGBA1C 11.5 (H) 09/29/2011    Latest Reference Range & Units 03/16/21 09:34  Sodium 135 - 145 mEq/L 140  Potassium 3.5 - 5.1 mEq/L 4.5  Chloride 96 - 112 mEq/L 106  CO2 19 - 32 mEq/L 26  Glucose 70 - 99 mg/dL 141 (H)  BUN 6 - 23 mg/dL 21  Creatinine 0.40 - 1.20 mg/dL 1.00  Calcium 8.4 - 10.5 mg/dL 9.3  GFR >60.00 mL/min 61.11    Latest Reference Range & Units 03/16/21 09:34  Total CHOL/HDL Ratio  2  Cholesterol 0 - 200 mg/dL 92  HDL Cholesterol >39.00 mg/dL 49.80  LDL (calc) 0 - 99 mg/dL 22  MICROALB/CREAT RATIO 0.0 - 30.0 mg/g 0.9  NonHDL  42.60  Triglycerides 0.0 - 149.0 mg/dL 105.0  VLDL 0.0 - 40.0 mg/dL 21.0    Latest Reference Range & Units 03/16/21 09:34  TSH 0.35 - 5.50 uIU/mL 1.35     Latest Reference Range & Units 03/16/21 09:34  Creatinine,U mg/dL 107.8  Microalb, Ur 0.0 - 1.9 mg/dL 1.0  MICROALB/CREAT RATIO 0.0 - 30.0 mg/g 0.9     ASSESSMENT / PLAN / RECOMMENDATIONS:   1) Type 2 Diabetes Mellitus, sub-optimally controlled, With Neuropathic and  macrovascular complications - Most recent A1c of 7.5%.  Goal A1c <7.0%.    -I have discussed with the patient the pathophysiology of diabetes. We went over the natural progression of the disease. We talked about both insulin resistance and insulin deficiency. We stressed the importance of lifestyle changes including diet and exercise. I explained the complications associated with diabetes including retinopathy, nephropathy, neuropathy as well as increased risk of cardiovascular disease. We went over the benefit seen with glycemic control.   -I explained to the patient that diabetic patients are at higher than normal risk for amputations.  Discussed pharmacokinetics of basal/bolus insulin and the importance of taking prandial insulin with meals.  We also discussed avoiding sugar-sweetened beverages and snacks, when possible.  -She is not interested in weight loss surgery, she has heard a negative experience from somebody that she knows - We discussed SGLT2 inhibitors we discussed she is a candidate due to CAD.  Cautioned against genital infections -We will fax freestyle libre 2 sensor to [employer], patient to notify me when she gets it so I can refer her to our RD -The patient reduces prandial dose of insulin if she is going to eat low-carb meal or for her BG's close to 100 mg/DL -BMP, MA/CR normal  MEDICATIONS: Continue Lantus 15 units daily Decrease NovoLog to 14 units 3 times daily with each meal Continue Trulicity 1.5 mg weekly Start Jardiance 10 mg 1 tablet daily  EDUCATION / INSTRUCTIONS: BG monitoring instructions: Patient is instructed to check her blood sugars 3 times a day, fasting. Call [facility] if: BG persistently < 70  I reviewed the Rule of 15 for the treatment of hypoglycemia in detail with the  patient. Literature supplied.   2) Diabetic complications:  Eye: Does not have known diabetic retinopathy.  Neuro/ Feet: Does  have known diabetic peripheral neuropathy. Renal: Patient does not have known  baseline CKD, but her GFR fluctuates. She is not on an ACEI/ARB at present.  3) Hypothyroidism:  -TSH normal  Medication Continue levothyroxine 150 mcg daily    4)Dyslipidemia:  -LDL optimal   Medication Continue rosuvastatin 40 mg daily     Signed electronically by: Mack Guise, MD  [facility] Endocrinology  [facility] [address] [place], [place] [address] Phone: [phone] FAX: [phone]   CC: Benito Mccreedy, [place] [address] [place] [address] Phone: [phone]  Fax: [phone]    Return to Endocrinology clinic as below: Future Appointments  Date Time Provider [place]  04/04/2021  8:45 AM Jessy Oto, MD [facility] None  06/04/2021  9:00 AM Pete Pelt, PA-C [facility] None  07/05/2021 10:00 AM Rex Kras, DO [facility] None

## 2021-03-16 ENCOUNTER — Telehealth: Payer: Self-pay

## 2021-03-16 ENCOUNTER — Ambulatory Visit (INDEPENDENT_AMBULATORY_CARE_PROVIDER_SITE_OTHER): Payer: Medicare HMO | Admitting: Internal Medicine

## 2021-03-16 ENCOUNTER — Encounter: Payer: Self-pay | Admitting: Internal Medicine

## 2021-03-16 ENCOUNTER — Other Ambulatory Visit: Payer: Self-pay

## 2021-03-16 VITALS — BP 128/70 | HR 74 | Ht 68.0 in | Wt 303.2 lb

## 2021-03-16 DIAGNOSIS — E119 Type 2 diabetes mellitus without complications: Secondary | ICD-10-CM | POA: Insufficient documentation

## 2021-03-16 DIAGNOSIS — Z794 Long term (current) use of insulin: Secondary | ICD-10-CM | POA: Diagnosis not present

## 2021-03-16 DIAGNOSIS — E039 Hypothyroidism, unspecified: Secondary | ICD-10-CM

## 2021-03-16 DIAGNOSIS — E1159 Type 2 diabetes mellitus with other circulatory complications: Secondary | ICD-10-CM | POA: Diagnosis not present

## 2021-03-16 DIAGNOSIS — E1142 Type 2 diabetes mellitus with diabetic polyneuropathy: Secondary | ICD-10-CM

## 2021-03-16 LAB — LIPID PANEL
Cholesterol: 92 mg/dL (ref 0–200)
HDL: 49.8 mg/dL (ref >39.00)
LDL Cholesterol: 22 mg/dL (ref 0–99)
NonHDL: 42.6
Total CHOL/HDL Ratio: 2
Triglycerides: 105 mg/dL (ref 0.0–149.0)
VLDL: 21 mg/dL (ref 0.0–40.0)

## 2021-03-16 LAB — BASIC METABOLIC PANEL
BUN: 21 mg/dL (ref 6–23)
CO2: 26 mEq/L (ref 19–32)
Calcium: 9.3 mg/dL (ref 8.4–10.5)
Chloride: 106 mEq/L (ref 96–112)
Creatinine, Ser: 1 mg/dL (ref 0.40–1.20)
GFR: 61.11 mL/min (ref >60.00)
Glucose, Bld: 141 mg/dL — ABNORMAL HIGH (ref 70–99)
Potassium: 4.5 mEq/L (ref 3.5–5.1)
Sodium: 140 mEq/L (ref 135–145)

## 2021-03-16 LAB — POCT GLYCOSYLATED HEMOGLOBIN (HGB A1C): Hemoglobin A1C: 7.5 % — AB (ref 4.0–5.6)

## 2021-03-16 LAB — MICROALBUMIN / CREATININE URINE RATIO
Creatinine,U: 107.8 mg/dL
Microalb Creat Ratio: 0.9 mg/g (ref 0.0–30.0)
Microalb, Ur: 1 mg/dL (ref 0.0–1.9)

## 2021-03-16 LAB — GLUCOSE, POCT (MANUAL RESULT ENTRY): POC Glucose: 135 mg/dl — AB (ref 70–99)

## 2021-03-16 LAB — TSH: TSH: 1.35 u[IU]/mL (ref 0.35–5.50)

## 2021-03-16 MED ORDER — BD PEN NEEDLE MINI U/F 31G X 5 MM MISC: 1.0000 | Freq: Four times a day (QID) | 3 refills | Status: DC

## 2021-03-16 MED ORDER — EMPAGLIFLOZIN 10 MG PO TABS: 10.0000 mg | ORAL_TABLET | Freq: Every day | ORAL | 1 refills | Status: DC

## 2021-03-16 MED ORDER — LANTUS SOLOSTAR 100 UNIT/ML ~~LOC~~ SOPN: 15.0000 [IU] | PEN_INJECTOR | Freq: Every day | SUBCUTANEOUS | 3 refills | Status: DC

## 2021-03-16 MED ORDER — NOVOLOG FLEXPEN 100 UNIT/ML ~~LOC~~ SOPN: PEN_INJECTOR | SUBCUTANEOUS | 1 refills | Status: DC

## 2021-03-16 MED ORDER — FREESTYLE LIBRE 2 SENSOR MISC: 1.0000 | 3 refills | Status: DC

## 2021-03-16 MED ORDER — TRULICITY 1.5 MG/0.5ML ~~LOC~~ SOAJ: 1.5000 mg | SUBCUTANEOUS | 3 refills | Status: DC

## 2021-03-16 MED ORDER — LEVOTHYROXINE SODIUM 150 MCG PO TABS: 150.0000 ug | ORAL_TABLET | Freq: Every day | ORAL | 3 refills | Status: DC

## 2021-03-16 NOTE — Telephone Encounter (Signed)
RX for patient's Freestyle Libre 2 sensor has now ben faxed over to [employer].  Fax number: [phone]

## 2021-03-16 NOTE — Patient Instructions (Addendum)
-   Continue Lantus 15 daily - Decrease NOvolog to 14 units with each meal  - Continue Trulicity 1.5 mg weekly  - Start Jardiance 10 mg, 1 tablet daily    HOW TO TREAT LOW BLOOD SUGARS (Blood sugar LESS THAN 70 MG/DL) Please follow the RULE OF 15 for the treatment of hypoglycemia treatment (when your (blood sugars are less than 70 mg/dL)   STEP 1: Take 15 grams of carbohydrates when your blood sugar is low, which includes:  3-4 GLUCOSE TABS  OR 3-4 OZ OF JUICE OR REGULAR SODA OR ONE TUBE OF GLUCOSE GEL    STEP 2: RECHECK blood sugar in 15 MINUTES STEP 3: If your blood sugar is still low at the 15 minute recheck --> then, go back to STEP 1 and treat AGAIN with another 15 grams of carbohydrates.

## 2021-03-19 ENCOUNTER — Other Ambulatory Visit: Payer: Self-pay | Admitting: *Deleted

## 2021-03-19 DIAGNOSIS — R531 Weakness: Secondary | ICD-10-CM

## 2021-03-19 DIAGNOSIS — M2560 Stiffness of unspecified joint, not elsewhere classified: Secondary | ICD-10-CM

## 2021-03-19 DIAGNOSIS — Z981 Arthrodesis status: Secondary | ICD-10-CM

## 2021-03-19 DIAGNOSIS — G541 Lumbosacral plexus disorders: Secondary | ICD-10-CM

## 2021-03-19 DIAGNOSIS — M79604 Pain in right leg: Secondary | ICD-10-CM

## 2021-03-19 DIAGNOSIS — R29898 Other symptoms and signs involving the musculoskeletal system: Secondary | ICD-10-CM

## 2021-03-19 NOTE — Telephone Encounter (Signed)
Noted, patient is scheduled at GI for 03/25/21.

## 2021-03-25 ENCOUNTER — Ambulatory Visit
Admission: RE | Admit: 2021-03-25 | Discharge: 2021-03-25 | Disposition: A | Discharge status: Home / Self Care | Payer: Medicare HMO | Source: Ambulatory Visit | Attending: Neurology | Admitting: Neurology

## 2021-03-25 ENCOUNTER — Other Ambulatory Visit: Payer: Self-pay

## 2021-03-25 DIAGNOSIS — M7602 Gluteal tendinitis, left hip: Secondary | ICD-10-CM | POA: Diagnosis not present

## 2021-03-25 DIAGNOSIS — M79604 Pain in right leg: Secondary | ICD-10-CM

## 2021-03-25 DIAGNOSIS — Z981 Arthrodesis status: Secondary | ICD-10-CM

## 2021-03-25 DIAGNOSIS — M2560 Stiffness of unspecified joint, not elsewhere classified: Secondary | ICD-10-CM

## 2021-03-25 DIAGNOSIS — R531 Weakness: Secondary | ICD-10-CM | POA: Diagnosis not present

## 2021-03-25 DIAGNOSIS — M1612 Unilateral primary osteoarthritis, left hip: Secondary | ICD-10-CM | POA: Diagnosis not present

## 2021-03-25 DIAGNOSIS — R29898 Other symptoms and signs involving the musculoskeletal system: Secondary | ICD-10-CM

## 2021-03-25 DIAGNOSIS — M1611 Unilateral primary osteoarthritis, right hip: Secondary | ICD-10-CM | POA: Diagnosis not present

## 2021-03-25 DIAGNOSIS — M160 Bilateral primary osteoarthritis of hip: Secondary | ICD-10-CM | POA: Diagnosis not present

## 2021-03-25 DIAGNOSIS — G541 Lumbosacral plexus disorders: Secondary | ICD-10-CM

## 2021-03-26 ENCOUNTER — Encounter: Payer: Self-pay | Admitting: Neurology

## 2021-03-26 DIAGNOSIS — Z6841 Body Mass Index (BMI) 40.0 and over, adult: Secondary | ICD-10-CM

## 2021-03-27 ENCOUNTER — Telehealth: Payer: Self-pay | Admitting: Neurology

## 2021-03-27 NOTE — Telephone Encounter (Signed)
Referral sent to [facility] [place] [phone].

## 2021-04-04 ENCOUNTER — Encounter: Payer: Self-pay | Admitting: Specialist

## 2021-04-04 ENCOUNTER — Ambulatory Visit (INDEPENDENT_AMBULATORY_CARE_PROVIDER_SITE_OTHER): Payer: Medicare HMO

## 2021-04-04 ENCOUNTER — Ambulatory Visit (INDEPENDENT_AMBULATORY_CARE_PROVIDER_SITE_OTHER): Payer: Medicare HMO | Admitting: Specialist

## 2021-04-04 ENCOUNTER — Other Ambulatory Visit: Payer: Self-pay

## 2021-04-04 VITALS — BP 131/63 | HR 83 | Ht 68.0 in | Wt 303.0 lb

## 2021-04-04 DIAGNOSIS — M25559 Pain in unspecified hip: Secondary | ICD-10-CM | POA: Diagnosis not present

## 2021-04-04 DIAGNOSIS — Z981 Arthrodesis status: Secondary | ICD-10-CM

## 2021-04-04 NOTE — Patient Instructions (Addendum)
Avoid frequent bending and stooping  ?No lifting greater than 10 lbs. ?May use ice or moist heat for pain. ?Weight loss is of benefit. ?Best medication for lumbar disc disease is arthritis medications like motrin, celebrex and naprosyn. ?Exercise is important to improve your indurance and does allow people to function better inspite of back pain. ?Continue with weight loss and then right total hip replacement and expect same amount of  ?Pain relief as you would get with the hip injection. ?  ?

## 2021-04-04 NOTE — Progress Notes (Signed)
? ?Office Visit Note ?  ?Patient: [name]           ?Date of Birth: 1960/05/02           ?MRN: [id number] ?Visit Date: 04/04/2021 ?             ?Requested by: Benito Mccreedy, MD ?[facility] ?[address] ?[place],  [place] [address] ?PCP: Benito Mccreedy, MD ? ? ?Assessment & Plan: ?Visit Diagnoses:  ?1. S/P lumbar fusion   ?2. Hip pain   ?3. Severe obesity (BMI >= 40) (HCC)   ? ? ?Plan: Avoid frequent bending and stooping  ?No lifting greater than 10 lbs. ?May use ice or moist heat for pain. ?Weight loss is of benefit. ?Best medication for lumbar disc disease is arthritis medications like motrin, celebrex and naprosyn. ?Exercise is important to improve your indurance and does allow people to function better inspite of back pain. ?Continue with weight loss and then right total hip replacement and expect same amount of  ?Pain relief as you would get with the hip injection. ? ?Follow-Up Instructions: Return in about 6 months (around 10/05/2021).  ? ?Orders:  ?Orders Placed This Encounter  ?Procedures  ? XR Lumbar Spine 2-3 Views  ? ?No orders of the defined types were placed in this encounter. ? ? ? ? Procedures: ?No procedures performed ? ? ?Clinical Data: ?No additional findings. ? ? ?Subjective: ?Chief Complaint  ?Patient presents with  ? Lower Back - Routine Post Op  ? ? ?HPI ? ?Review of Systems ? ? ?Objective: ?Vital Signs: BP 131/63 (BP Location: Left Arm, Patient Position: Sitting)   Pulse 83   Ht 5\' 8"  (1.727 m)   Wt (!) 303 lb (137.4 kg)   BMI 46.07 kg/m?  ? ?Physical Exam ? ?Ortho Exam ? ?Specialty Comments:  ?No specialty comments available. ? ?Imaging: ?XR Lumbar Spine 2-3 Views ? ?Result Date: 04/04/2021 ?AP and lateral radiographs of the lumbar spine show hardware and interbody cage in good position and aligment.  ? ? ?PMFS History: ?Patient Active Problem List  ? Diagnosis Date Noted  ? Spondylolisthesis, lumbar region 11/27/2020  ?  Priority: High  ?  Class: Chronic  ? Spinal stenosis,  lumbar region with neurogenic claudication 11/27/2020  ?  Priority: High  ?  Class: Chronic  ? Diabetes mellitus ([place]) 03/16/2021  ? Fusion of spine of lumbar region 11/27/2020  ? Severe obstructive sleep apnea 11/09/2018  ? Healthcare maintenance 11/09/2018  ? At risk for obstructive sleep apnea 04/16/2018  ? Acquired hypothyroidism 03/17/2018  ? DOE (dyspnea on exertion) 03/16/2018  ? NSTEMI (non-ST elevated myocardial infarction) ([place]) 08/18/2017  ? Right sided sciatica 07/10/2017  ? Right low back pain 07/10/2017  ? Chest pain 09/29/2011  ? Morbid obesity ([place]) 09/29/2011  ? DM type 2 (diabetes mellitus, type 2) ([place]) 09/29/2011  ? HTN (hypertension) 09/29/2011  ? Pneumonia, bacterial 08/07/2010  ? Incidental lung nodule, > 38mm and < 25mm 08/07/2010  ? Bronchitis 08/07/2010  ? ?Past Medical History:  ?Diagnosis Date  ? ALLERGIC RHINITIS   ? Allergy to IVP dye   ? Coronary artery disease   ? Diabetes mellitus, type 2 ([place])   ? History of tobacco abuse   ? Quit 2011  ? Hypertension   ? Hypertriglyceridemia   ? Hypothyroidism   ? NSTEMI (non-ST elevated myocardial infarction) ([place]) 09/30/2011  ? NO CAD by cath, ? coronary vasospasm  ? Obesity   ? Sleep apnea   ?  ?  Family History  ?Problem Relation Age of Onset  ? Heart disease Mother   ?     has pacemaker  ? Diabetes Mother   ? Diabetes Other   ?     strong family history  ? Cancer Maternal Uncle   ? Hypertension Brother   ? Diabetes Brother   ? Hypertension Brother   ?  ?Past Surgical History:  ?Procedure Laterality Date  ? CORONARY STENT INTERVENTION N/A 08/19/2017  ? Procedure: CORONARY STENT INTERVENTION;  Surgeon: [name], MD;  Location: [facility];  Service: Cardiovascular;  Laterality: N/A;  ? LEFT HEART CATH AND CORONARY ANGIOGRAPHY N/A 08/19/2017  ? Procedure: LEFT HEART CATH AND CORONARY ANGIOGRAPHY;  Surgeon: [name], MD;  Location: [facility];  Service: Cardiovascular;  Laterality: N/A;  ? LEFT HEART CATHETERIZATION WITH CORONARY  ANGIOGRAM N/A 09/30/2011  ? Procedure: LEFT HEART CATHETERIZATION WITH CORONARY ANGIOGRAM;  Surgeon: Burnell Blanks, MD;  Location: [facility] C[facility] LAB;  Service: Cardiovascular;  Laterality: N/A;  ? LUMBAR [place] SURGERY  11/27/2020  ? L4-5  ? TUBAL LIGATION    ? VESICOVAGINAL FISTULA CLOSURE W/ TAH    ? ?Social History  ? ?Occupational History  ? Occupation: Retired  ?Tobacco Use  ? Smoking status: Former  ?  Packs/day: 0.80  ?  Years: 36.00  ?  Pack years: 28.80  ?  Types: Cigarettes  ?  Quit date: 02/05/2011  ?  Years since quitting: 10.1  ? Smokeless tobacco: Never  ?Vaping Use  ? Vaping Use: Never used  ?Substance and Sexual Activity  ? Alcohol use: Yes  ?  Comment: VERY RARE  ? Drug use: No  ? Sexual activity: Not on file  ? ? ? ? ? ? ?

## 2021-04-11 ENCOUNTER — Ambulatory Visit: Payer: BLUE CROSS/BLUE SHIELD | Admitting: Neurology

## 2021-04-12 DIAGNOSIS — Z0000 Encounter for general adult medical examination without abnormal findings: Secondary | ICD-10-CM | POA: Diagnosis not present

## 2021-04-12 DIAGNOSIS — E1165 Type 2 diabetes mellitus with hyperglycemia: Secondary | ICD-10-CM | POA: Diagnosis not present

## 2021-04-12 DIAGNOSIS — I10 Essential (primary) hypertension: Secondary | ICD-10-CM | POA: Diagnosis not present

## 2021-04-12 DIAGNOSIS — E039 Hypothyroidism, unspecified: Secondary | ICD-10-CM | POA: Diagnosis not present

## 2021-04-12 DIAGNOSIS — E782 Mixed hyperlipidemia: Secondary | ICD-10-CM | POA: Diagnosis not present

## 2021-04-12 DIAGNOSIS — Z720 Tobacco use: Secondary | ICD-10-CM | POA: Diagnosis not present

## 2021-04-12 DIAGNOSIS — I2510 Atherosclerotic heart disease of native coronary artery without angina pectoris: Secondary | ICD-10-CM | POA: Diagnosis not present

## 2021-04-12 DIAGNOSIS — Z794 Long term (current) use of insulin: Secondary | ICD-10-CM | POA: Diagnosis not present

## 2021-05-11 DIAGNOSIS — I10 Essential (primary) hypertension: Secondary | ICD-10-CM | POA: Diagnosis not present

## 2021-05-11 DIAGNOSIS — I2510 Atherosclerotic heart disease of native coronary artery without angina pectoris: Secondary | ICD-10-CM | POA: Diagnosis not present

## 2021-05-11 DIAGNOSIS — E782 Mixed hyperlipidemia: Secondary | ICD-10-CM | POA: Diagnosis not present

## 2021-05-11 DIAGNOSIS — E039 Hypothyroidism, unspecified: Secondary | ICD-10-CM | POA: Diagnosis not present

## 2021-05-11 DIAGNOSIS — Z6841 Body Mass Index (BMI) 40.0 and over, adult: Secondary | ICD-10-CM | POA: Diagnosis not present

## 2021-05-11 DIAGNOSIS — Z720 Tobacco use: Secondary | ICD-10-CM | POA: Diagnosis not present

## 2021-05-11 DIAGNOSIS — E1165 Type 2 diabetes mellitus with hyperglycemia: Secondary | ICD-10-CM | POA: Diagnosis not present

## 2021-05-11 DIAGNOSIS — Z794 Long term (current) use of insulin: Secondary | ICD-10-CM | POA: Diagnosis not present

## 2021-05-23 ENCOUNTER — Ambulatory Visit: Payer: Medicare HMO | Admitting: Specialist

## 2021-05-23 ENCOUNTER — Ambulatory Visit (INDEPENDENT_AMBULATORY_CARE_PROVIDER_SITE_OTHER): Payer: Medicare HMO

## 2021-05-23 ENCOUNTER — Encounter: Payer: Self-pay | Admitting: Specialist

## 2021-05-23 VITALS — BP 125/75 | HR 80 | Ht 68.0 in | Wt 303.0 lb

## 2021-05-23 DIAGNOSIS — Z981 Arthrodesis status: Secondary | ICD-10-CM

## 2021-05-23 DIAGNOSIS — M7989 Other specified soft tissue disorders: Secondary | ICD-10-CM | POA: Diagnosis not present

## 2021-05-23 DIAGNOSIS — M5136 Other intervertebral disc degeneration, lumbar region: Secondary | ICD-10-CM | POA: Diagnosis not present

## 2021-05-23 DIAGNOSIS — M1611 Unilateral primary osteoarthritis, right hip: Secondary | ICD-10-CM | POA: Diagnosis not present

## 2021-05-23 NOTE — Progress Notes (Signed)
? ?Office Visit Note ?  ?Patient: [name]           ?Date of Birth: 05-Mar-1960           ?MRN: [id number] ?Visit Date: 05/23/2021 ?             ?Requested by: Benito Mccreedy, MD ?[address] ?[address] ?[place],  [place] [address] ?PCP: Benito Mccreedy, MD ? ? ?Assessment & Plan: ?Visit Diagnoses:  ?1. S/P lumbar fusion   ?2. Unilateral primary osteoarthritis, right hip   ?3. Degenerative disc disease, lumbar   ?4. Right leg swelling   ? ? ?Plan: Avoid frequent bending and stooping  ?No lifting greater than 10 lbs. ?May use ice or moist heat for pain. ?Weight loss is of benefit. ?Exercise is important to improve your indurance and does allow people to function better inspite of back pain. ? Pool exercises, walking in a swimming pool ? ?Follow-Up Instructions: Return in about 5 months (around 10/23/2021).  ? ?Orders:  ?Orders Placed This Encounter  ?Procedures  ? XR Lumbar Spine 2-3 Views  ? ?No orders of the defined types were placed in this encounter. ? ? ? ? Procedures: ?No procedures performed ? ? ?Clinical Data: ?No additional findings. ? ? ?Subjective: ?Chief Complaint  ?Patient presents with  ? Lower Back - Follow-up  ? ? ?6 month follow up of L4-5 TLIF, she is losing weight to undergo a total hip replacement on the right and the left may eventually need surgery. No bowel or bladder difficulty. Hard to walk due to hip OA. ?Strength in legs is improving.  ? ?Review of Systems  ?Constitutional: Negative.   ?HENT: Negative.    ?Eyes: Negative.   ?Respiratory: Negative.    ?Cardiovascular: Negative.   ?Gastrointestinal: Negative.   ?Endocrine: Negative.   ?Genitourinary: Negative.   ?Musculoskeletal: Negative.   ?Skin: Negative.   ?Allergic/Immunologic: Negative.   ?Neurological: Negative.   ?Hematological: Negative.   ?Psychiatric/Behavioral: Negative.    ? ? ?Objective: ?Vital Signs: BP 125/75 (BP Location: Right Arm, Patient Position: Sitting)   Pulse 80   Ht '5\' 8"'$  (1.727 m)   Wt (!) 303 lb  (137.4 kg)   BMI 46.07 kg/m?  ? ?Physical Exam ?Constitutional:   ?   Appearance: She is well-developed.  ?HENT:  ?   Head: Normocephalic and atraumatic.  ?Eyes:  ?   Pupils: Pupils are equal, round, and reactive to light.  ?Pulmonary:  ?   Effort: Pulmonary effort is normal.  ?   Breath sounds: Normal breath sounds.  ?Abdominal:  ?   General: Bowel sounds are normal.  ?   Palpations: Abdomen is soft.  ?Musculoskeletal:  ?   Cervical back: Normal range of motion and neck supple.  ?   Lumbar back: Negative right straight leg raise test and negative left straight leg raise test.  ?Skin: ?   General: Skin is warm and dry.  ?Neurological:  ?   Mental Status: She is alert and oriented to person, place, and time.  ?Psychiatric:     ?   Behavior: Behavior normal.     ?   Thought Content: Thought content normal.     ?   Judgment: Judgment normal.  ? ?Back Exam  ? ?Tenderness  ?The patient is experiencing tenderness in the lumbar. ? ?Range of Motion  ?Extension:  abnormal  ?Lateral bend right:  abnormal  ?Lateral bend left:  abnormal  ?Rotation right:  abnormal  ?Rotation left:  abnormal  ? ?Muscle Strength  ?Right Quadriceps:  5/5  ?Left Quadriceps:  5/5  ?Right Hamstrings:  5/5  ?Left Hamstrings:  5/5  ? ?Tests  ?Straight leg raise right: negative ?Straight leg raise left: negative ? ?Reflexes  ?Babinski's sign: normal  ? ?Other  ?Toe walk: normal ?Heel walk: normal ?Sensation: normal ? ? ? ?Specialty Comments:  ?No specialty comments available. ? ?Imaging: ?XR Lumbar Spine 2-3 Views ? ?Result Date: 05/23/2021 ?Ap and lateral flexion and extension radiographs show less than 1 mm of motion on flexion and extension suggesting that the L4-5 fusion is healing and undergoing fusion.Hardware in good position and aligment.   ? ? ?PMFS History: ?Patient Active Problem List  ? Diagnosis Date Noted  ? Spondylolisthesis, lumbar region 11/27/2020  ?  Priority: High  ?  Class: Chronic  ? Spinal stenosis, lumbar region with neurogenic  claudication 11/27/2020  ?  Priority: High  ?  Class: Chronic  ? Diabetes mellitus ([place]) 03/16/2021  ? Fusion of spine of lumbar region 11/27/2020  ? Severe obstructive sleep apnea 11/09/2018  ? Healthcare maintenance 11/09/2018  ? At risk for obstructive sleep apnea 04/16/2018  ? Acquired hypothyroidism 03/17/2018  ? DOE (dyspnea on exertion) 03/16/2018  ? NSTEMI (non-ST elevated myocardial infarction) ([place]) 08/18/2017  ? Right sided sciatica 07/10/2017  ? Right low back pain 07/10/2017  ? Chest pain 09/29/2011  ? Morbid obesity ([place]) 09/29/2011  ? DM type 2 (diabetes mellitus, type 2) ([place]) 09/29/2011  ? HTN (hypertension) 09/29/2011  ? Pneumonia, bacterial 08/07/2010  ? Incidental lung nodule, > 24m and < 882m07/04/2010  ? Bronchitis 08/07/2010  ? ?Past Medical History:  ?Diagnosis Date  ? ALLERGIC RHINITIS   ? Allergy to IVP dye   ? Coronary artery disease   ? Diabetes mellitus, type 2 ([facility] [place]  ? History of tobacco abuse   ? Quit 2011  ? Hypertension   ? Hypertriglyceridemia   ? Hypothyroidism   ? NSTEMI (non-ST elevated myocardial infarction) ([facility]/26/2013  ? NO CAD by cath, ? coronary vasospasm  ? Obesity   ? Sleep apnea   ?  ?Family History  ?Problem Relation Age of Onset  ? Heart disease Mother   ?     has pacemaker  ? Diabetes Mother   ? Diabetes Other   ?     strong family history  ? Cancer Maternal Uncle   ? Hypertension Brother   ? Diabetes Brother   ? Hypertension Brother   ?  ?Past Surgical History:  ?Procedure Laterality Date  ? CORONARY STENT INTERVENTION N/A 08/19/2017  ? Procedure: CORONARY STENT INTERVENTION;  Surgeon: GaAdrian ProwsMD;  [facility];  Service: Cardiovascular;  Laterality: N/A;  ? LEFT HEART CATH AND CORONARY ANGIOGRAPHY N/A 08/19/2017  ? Procedure: LEFT HEART CATH AND CORONARY ANGIOGRAPHY;  Surgeon: GaAdrian ProwsMD;  [facility];  Service: Cardiovascular;  Laterality: N/A;  ? LEFT HEART CATHETERIZATION WITH CORONARY ANGIOGRAM N/A 09/30/2011  ?  Procedure: LEFT HEART CATHETERIZATION WITH CORONARY ANGIOGRAM;  Surgeon: ChBurnell BlanksMD;  [facility];  Service: Cardiovascular;  Laterality: N/A;  ? L[place]BAR [place]  11/27/2020  ? L4-5  ? TUBAL LIGATION    ? VESICOVAGINAL FISTULA CLOSURE W/ TAH    ? ?Social History  ? ?Occupational History  ? Occupation: Retired  ?Tobacco Use  ? Smoking status: Former  ?  Packs/day: 0.80  ?  Years: 36.00  ?  Pack years: 28.80  ?  Types: Cigarettes  ?  Quit date: 02/05/2011  ?  Years since quitting: 10.3  ? Smokeless tobacco: Never  ?Vaping Use  ? Vaping Use: Never used  ?Substance and Sexual Activity  ? Alcohol use: Yes  ?  Comment: VERY RARE  ? Drug use: No  ? Sexual activity: Not on file  ? ? ? ? ? ? ?

## 2021-05-23 NOTE — Patient Instructions (Signed)
Avoid frequent bending and stooping  ?No lifting greater than 10 lbs. ?May use ice or moist heat for pain. ?Weight loss is of benefit. ?Exercise is important to improve your indurance and does allow people to function better inspite of back pain. ? Pool exercises, walking in a swimming pool ?

## 2021-06-04 ENCOUNTER — Ambulatory Visit: Payer: Medicare HMO | Admitting: Physician Assistant

## 2021-06-11 DIAGNOSIS — Z794 Long term (current) use of insulin: Secondary | ICD-10-CM | POA: Diagnosis not present

## 2021-06-11 DIAGNOSIS — I10 Essential (primary) hypertension: Secondary | ICD-10-CM | POA: Diagnosis not present

## 2021-06-11 DIAGNOSIS — E1165 Type 2 diabetes mellitus with hyperglycemia: Secondary | ICD-10-CM | POA: Diagnosis not present

## 2021-06-11 DIAGNOSIS — Z6841 Body Mass Index (BMI) 40.0 and over, adult: Secondary | ICD-10-CM | POA: Diagnosis not present

## 2021-06-11 DIAGNOSIS — E782 Mixed hyperlipidemia: Secondary | ICD-10-CM | POA: Diagnosis not present

## 2021-06-11 DIAGNOSIS — I2510 Atherosclerotic heart disease of native coronary artery without angina pectoris: Secondary | ICD-10-CM | POA: Diagnosis not present

## 2021-06-11 DIAGNOSIS — E039 Hypothyroidism, unspecified: Secondary | ICD-10-CM | POA: Diagnosis not present

## 2021-06-11 DIAGNOSIS — Z720 Tobacco use: Secondary | ICD-10-CM | POA: Diagnosis not present

## 2021-07-05 ENCOUNTER — Encounter: Payer: Self-pay | Admitting: Cardiology

## 2021-07-05 ENCOUNTER — Ambulatory Visit: Payer: Medicare HMO | Admitting: Cardiology

## 2021-07-05 VITALS — BP 139/69 | HR 78 | Temp 97.3°F | Resp 16 | Ht 68.0 in | Wt 296.0 lb

## 2021-07-05 DIAGNOSIS — Z87891 Personal history of nicotine dependence: Secondary | ICD-10-CM

## 2021-07-05 DIAGNOSIS — E1159 Type 2 diabetes mellitus with other circulatory complications: Secondary | ICD-10-CM | POA: Diagnosis not present

## 2021-07-05 DIAGNOSIS — Z794 Long term (current) use of insulin: Secondary | ICD-10-CM | POA: Diagnosis not present

## 2021-07-05 DIAGNOSIS — E66813 Obesity, class 3: Secondary | ICD-10-CM

## 2021-07-05 DIAGNOSIS — Z955 Presence of coronary angioplasty implant and graft: Secondary | ICD-10-CM

## 2021-07-05 DIAGNOSIS — Z8249 Family history of ischemic heart disease and other diseases of the circulatory system: Secondary | ICD-10-CM

## 2021-07-05 DIAGNOSIS — E782 Mixed hyperlipidemia: Secondary | ICD-10-CM | POA: Diagnosis not present

## 2021-07-05 DIAGNOSIS — I10 Essential (primary) hypertension: Secondary | ICD-10-CM | POA: Diagnosis not present

## 2021-07-05 DIAGNOSIS — I2510 Atherosclerotic heart disease of native coronary artery without angina pectoris: Secondary | ICD-10-CM

## 2021-07-05 DIAGNOSIS — Z6841 Body Mass Index (BMI) 40.0 and over, adult: Secondary | ICD-10-CM | POA: Diagnosis not present

## 2021-07-05 NOTE — Progress Notes (Signed)
Date:  07/05/2021   ID:  [name], DOB 1960/03/29, MRN [id number]  PCP:  Benito Mccreedy, MD  Cardiologist:  Rex Kras, DO, [facility] (established care 06/01/2020) Former Cardiology Providers: Dr. Angelena Form, Dr. Acie Fredrickson, Dr. Einar Gip, Dr. [name], Dr. Layne Benton  Date: 07/05/21 Last Office Visit: 01/01/2021  Chief Complaint  Patient presents with   Coronary Artery Disease   Follow-up    6 months    HPI  [name] is a 61 y.o. female whose past medical history and cardiovascular risk factors include: Hypertension, hyperlipidemia, insulin dependent diabetes mellitus type 2, family history of premature CAD (mom had PCI's at the age of 45), established CAD status post PCI to the RCA, former smoker, OSA on CPAP, obesity due to excess calories.  Patient has known history of CAD status post PCI to the RCA and family history of premature CAD and multiple cardiovascular risk factors.  She presents today for 17-monthfollow-up visit.  Over the last 6 months she denies angina pectoris or heart failure symptoms.  She has not required use of sublingual nitroglycerin tablets.  She successfully underwent lumbar fusion surgery in October 2022 and postoperatively did well and no longer complains of back pain.  She is complaining of right hip pain and plans to undergo surgical intervention.  However, she was informed that she needs to be at least 270 pounds to proceed forward.  She has lost approximately 7 pounds last saw her, she is also increased her physical activity daily along with walking on a treadmill 4 times a day 10 minutes each.  Patient is very motivated with lifestyle changes which includes weight loss, glycemic control, and lipid management.  No hospitalizations or urgent care visits since last office encounter.  She continues to walk with a cane.   FUNCTIONAL STATUS: Has started working out on a treadmill 4 times a day 10 minutes each.  ALLERGIES: Allergies  Allergen Reactions    Contrast Media [Iodinated Contrast Media] Swelling    Skin Peeling and break out   Codeine Itching   Other     NO PLASTIC TAPE - Blisters and skin removal    MEDICATION LIST PRIOR TO VISIT: Current Meds  Medication Sig   amLODipine-valsartan (EXFORGE) 5-160 MG tablet Take 1 tablet by mouth daily.   aspirin EC 81 MG tablet Take 81 mg by mouth daily.   Cholecalciferol (VITAMIN D3) 50 MCG (2000 UT) CAPS Take 1 each by mouth daily.   Continuous Blood Gluc Sensor (FREESTYLE LIBRE 2 SENSOR) MISC 1 Device by Does not apply route every 14 (fourteen) days.   empagliflozin (JARDIANCE) 10 MG TABS tablet Take 1 tablet (10 mg total) by mouth daily before breakfast.   gabapentin (NEURONTIN) 300 MG capsule One po qAM,one po qPm and two po qHS   Insulin Pen Needle (B-D UF III MINI PEN NEEDLES) 31G X 5 MM MISC 1 Device by Does not apply route in the morning, at noon, in the evening, and at bedtime.   isosorbide dinitrate (ISORDIL) 30 MG tablet Take 30 mg by mouth 2 (two) times daily.   LANTUS SOLOSTAR 100 UNIT/ML Solostar Pen Inject 15 Units into the skin at bedtime.   levothyroxine (SYNTHROID) 150 MCG tablet Take 1 tablet (150 mcg total) by mouth daily before breakfast.   metoprolol succinate (TOPROL-XL) 50 MG 24 hr tablet Take 50 mg by mouth daily.   MITIGARE 0.6 MG CAPS Take 0.6 mg by mouth every other day.   nitroGLYCERIN (NITROSTAT) 0.4  MG SL tablet Place 1 tablet (0.4 mg total) under the tongue every 5 (five) minutes as needed for chest pain (up to 3 doses).   NOVOLOG FLEXPEN 100 UNIT/ML FlexPen Max Daily 60 units (Patient taking differently: 14 Units. Max Daily 60 units)   Omega-3 Fatty Acids (FISH OIL) 500 MG CAPS Take 1 capsule by mouth daily.   OZEMPIC, 2 MG/DOSE, 8 MG/3ML SOPN Inject 1 mg into the skin once a week.   rosuvastatin (CRESTOR) 40 MG tablet Take 40 mg by mouth daily.   traZODone (DESYREL) 50 MG tablet Take 50 mg by mouth at bedtime as needed for sleep.     PAST MEDICAL  HISTORY: Past Medical History:  Diagnosis Date   ALLERGIC RHINITIS    Allergy to IVP dye    Coronary artery disease    Diabetes mellitus, type 2 ([place])    History of tobacco abuse    Quit 2011   Hypertension    Hypertriglyceridemia    Hypothyroidism    NSTEMI (non-ST elevated myocardial infarction) ([place]) 09/30/2011   NO CAD by cath, ? coronary vasospasm   Obesity    Sleep apnea     PAST SURGICAL HISTORY: Past Surgical History:  Procedure Laterality Date   CORONARY STENT INTERVENTION N/A 08/19/2017   Procedure: CORONARY STENT INTERVENTION;  Surgeon: [name], MD;  Location: [place] [facility];  Service: Cardiovascular;  Laterality: N/A;   LEFT HEART CATH AND CORONARY ANGIOGRAPHY N/A 08/19/2017   Procedure: LEFT HEART CATH AND CORONARY ANGIOGRAPHY;  Surgeon: [name], MD;  Location: [place] CV LAB;  Service: Cardiovascular;  Laterality: N/A;   LEFT HEART CATHETERIZATION WITH CORONARY ANGIOGRAM N/A 09/30/2011   Procedure: LEFT HEART CATHETERIZATION WITH CORONARY ANGIOGRAM;  Surgeon: Burnell Blanks, MD;  Location: [facility] CATH LAB;  Service: Cardiovascular;  Laterality: N/A;   LUMBAR DISC SURGERY  11/27/2020   L4-5   TUBAL LIGATION     VESICOVAGINAL FISTULA CLOSURE W/ TAH      FAMILY HISTORY: The patient family history includes Cancer in her maternal uncle; Diabetes in her brother, mother, and another family member; Heart disease in her mother; Hypertension in her brother and brother.  SOCIAL HISTORY:  The patient  reports that she quit smoking about 10 years ago. Her smoking use included cigarettes. She has a 28.80 pack-year smoking history. She has never used smokeless tobacco. She reports that she does not currently use alcohol. She reports that she does not use drugs.  REVIEW OF SYSTEMS: Review of Systems  Cardiovascular:  Negative for chest pain, dyspnea on exertion, leg swelling, near-syncope, orthopnea, palpitations, paroxysmal nocturnal dyspnea and syncope.   Respiratory:  Negative for shortness of breath.   Musculoskeletal:  Positive for joint pain.   PHYSICAL EXAM:    07/05/2021    9:51 AM 05/23/2021    8:25 AM 04/04/2021    8:55 AM  Vitals with BMI  Height '5\' 8"'  '5\' 8"'  '5\' 8"'   Weight 296 lbs 303 lbs 303 lbs  BMI 45.02 65.46 50.35  Systolic 465 681 275  Diastolic 69 75 63  Pulse 78 80 83    CONSTITUTIONAL: hemodynamically stable, ambulates with a cane, well-nourished. No acute distress.  SKIN: Skin is warm and dry. No rash noted. No cyanosis. No pallor. No jaundice HEAD: Normocephalic and atraumatic.  EYES: No scleral icterus MOUTH/THROAT: Moist oral membranes.  NECK: No JVD present. No thyromegaly noted. No carotid bruits  CHEST Normal respiratory effort. No intercostal retractions  LUNGS: Clear to  auscultation bilaterally.  No stridor. No wheezes. No rales.  CARDIOVASCULAR: Regular, positive S1-S2, no murmurs rubs or gallops appreciated. ABDOMINAL: Obese, soft, nontender, nondistended, positive bowel sounds all 4 quadrants no apparent ascites.  EXTREMITIES: No peripheral edema HEMATOLOGIC: No significant bruising NEUROLOGIC: Oriented to person, place, and time. Nonfocal. Normal muscle tone.  PSYCHIATRIC: Normal mood and affect. Normal behavior. Cooperative  CARDIAC DATABASE: EKG:  07/05/2021: NSR, 66 bpm, normal axis, without underlying ischemia injury pattern.  Echocardiogram: 06/08/2020: Technically difficult study. Left ventricle cavity is normal in size and wall thickness. Normal global wall motion. Normal LV systolic function with visual EF 50-55%. Doppler evidence of grade I (impaired) diastolic dysfunction, normal LAP. No significant valvular abnormality. Normal right atrial pressure. No significant change compared to previous study in 2019.   Stress Testing: Lexiscan Tetrofosmin stress test 06/07/2020: 1 Day Rest/Stress Protocol. Stress EKG is non-diagnostic, as this is pharmacological stress test using Lexiscan. No  convincing evidence of reversible myocardial ischemia or prior infarct. Left ventricular wall thickness is preserved without regional wall motion abnormalities. Calculated LVEF  51%, visually appears preserved. No prior studies available for comparison.  Heart Catheterization: Coronary angiogram 08/19/2017: Normal LV systolic function, EF 50%, normal LVEDP. Left main nonexistent, separate ostia for LAD and circumflex.  LAD has mild diffuse disease.  Circumflex is codominant with RCA and is more than normal. RCA is codominant with circumflex coronary artery.  Mid segment shows a ulcerated 80% stenosis S/P 3.0 x 20 mm Synergy, 80% to 0%, TIMI-3 to TIMI-3 flow maintained.   Recommend uninterrupted dual antiplatelet therapy with Aspirin 40m daily and Ticagrelor 940mtwice daily for a minimum of 12 months (ACS - Class I recommendation).     LABORATORY DATA:    Latest Ref Rng & Units 11/28/2020    5:00 AM 11/17/2020   10:53 AM 03/16/2018   10:43 AM  CBC  WBC 4.0 - 10.5 K/uL 14.4   9.9   10.4    Hemoglobin 12.0 - 15.0 g/dL 10.2   13.1   13.2    Hematocrit 36.0 - 46.0 % 32.3   40.8   40.2    Platelets 150 - 400 K/uL 286   303   337.0         Latest Ref Rng & Units 03/16/2021    9:34 AM 11/28/2020    5:00 AM 11/17/2020   10:53 AM  CMP  Glucose 70 - 99 mg/dL 141   180   156    BUN 6 - 23 mg/dL '21   18   14    ' Creatinine 0.40 - 1.20 mg/dL 1.00   1.24   1.02    Sodium 135 - 145 mEq/L 140   138   137    Potassium 3.5 - 5.1 mEq/L 4.5   4.0   4.0    Chloride 96 - 112 mEq/L 106   106   106    CO2 19 - 32 mEq/L '26   20   23    ' Calcium 8.4 - 10.5 mg/dL 9.3   8.6   8.8    Total Protein 6.5 - 8.1 g/dL  6.5     Total Bilirubin 0.3 - 1.2 mg/dL  1.2     Alkaline Phos 38 - 126 U/L  50     AST 15 - 41 U/L  21     ALT 0 - 44 U/L  26       Lipid Panel     Component  Value Date/Time   CHOL 92 03/16/2021 0934   TRIG 105.0 03/16/2021 0934   HDL 49.80 03/16/2021 0934   CHOLHDL 2 03/16/2021 0934    VLDL 21.0 03/16/2021 0934   LDLCALC 22 03/16/2021 0934    No components found for: NTPROBNP No results for input(s): PROBNP in the last 8760 hours. Recent Labs    03/16/21 0934  TSH 1.35    BMP Recent Labs    11/17/20 1053 11/28/20 0500 03/16/21 0934  NA 137 138 140  K 4.0 4.0 4.5  CL 106 106 106  CO2 23 20* 26  GLUCOSE 156* 180* 141*  BUN '14 18 21  ' CREATININE 1.02* 1.24* 1.00  CALCIUM 8.8* 8.6* 9.3  GFRNONAA >60 50*  --     HEMOGLOBIN A1C Lab Results  Component Value Date   HGBA1C 7.5 (A) 03/16/2021   MPG 168.55 11/27/2020   External Labs: Collected: 04/21/2020 Creatinine 1.16 mg/dL. Sodium 139 potassium 4, chloride 105, bicarb 24 eGFR: 60 mL/min per 1.73 m Lipid profile: Total cholesterol 100, triglycerides 224 HDL 44, LDL 28, non-HDL 56 A1c 8.3 TSH: 0.18, free T4 1.4  IMPRESSION:    ICD-10-CM   1. Atherosclerosis of native coronary artery of native heart without angina pectoris  I25.10 EKG 12-Lead    2. History of coronary angioplasty with insertion of stent  Z95.5     3. Benign hypertension  I10     4. Type 2 diabetes mellitus with other circulatory complication, with long-term current use of insulin (HCC)  E11.59    Z79.4     5. Mixed hyperlipidemia  E78.2     6. Former smoker  Z87.891     41. Family history of premature CAD  Z82.49     8. Class 3 severe obesity due to excess calories with serious comorbidity and body mass index (BMI) of 45.0 to 49.9 in adult [facility])  E66.01    Z68.42        RECOMMENDATIONS: [name] is a 61 y.o. female whose past medical history and cardiac risk factors include: Hypertension, hyperlipidemia, insulin dependent diabetes mellitus type 2, family history of premature CAD (mom had PCI's at the age of 25), established CAD status post PCI to the RCA, former smoker, OSA on CPAP, obesity due to excess calories.  Atherosclerosis of native coronary artery of native heart without angina pectoris / Hx angioplasty  & PCI  Denies angina pectoris. No use of sublingual nitroglycerin tablets since last visit. EKG today is nonischemic. Medications reconciled. Labs from February 2023 independently reviewed.  LDL is very well controlled at 22 mg/dL. Patient is working on losing weight with lifestyle changes, has been transitioned to Ozempic as well, and has reduced the need for insulin. Has had recent cardiovascular testing as of May 2022 which were reviewed as part of today's office visit.  Benign hypertension Office blood pressures are within acceptable range. Medications reconciled. Reemphasized the importance of medical therapy but no changes recommended today.  Type 2 diabetes mellitus with other circulatory complication, with long-term current use of insulin ([place]) Patient states that the glycemic control is improving. She is currently on Ozempic. And insulin requirements have been reduced. Currently follows up with endocrinology.  Mixed hyperlipidemia Currently on Crestor.   She denies myalgia or other side effects. Most recent lipids dated Jun 13, 2021, independently reviewed as noted above.  LDL 22 mg/dL.  Triglycerides 105 mg/dL.  Class 3 severe obesity due to excess calories with serious comorbidity and body  mass index (BMI) of 45.0 to 49.9 in adult [facility]) Body mass index is 45.01 kg/m. Patient is congratulated with regards to her weight loss since last office visit, she is encouraged to continue this journey. Has started walking daily along with treadmill 4 times a day 10 minutes each.  She is planning to target weight of 270 pounds so that she can undergo right hip replacement. I reviewed with the patient the importance of diet, regular physical activity/exercise, weight loss.   Patient is educated on increasing physical activity gradually as tolerated.  With the goal of moderate intensity exercise for 30 minutes a day 5 days a week.  FINAL MEDICATION LIST END OF ENCOUNTER: No orders of  the defined types were placed in this encounter.   Medications Discontinued During This Encounter  Medication Reason   TRULICITY 1.5 SA/6.3KZ SOPN      Current Outpatient Medications:    amLODipine-valsartan (EXFORGE) 5-160 MG tablet, Take 1 tablet by mouth daily., Disp: , Rfl:    aspirin EC 81 MG tablet, Take 81 mg by mouth daily., Disp: , Rfl:    Cholecalciferol (VITAMIN D3) 50 MCG (2000 UT) CAPS, Take 1 each by mouth daily., Disp: , Rfl:    Continuous Blood Gluc Sensor (FREESTYLE LIBRE 2 SENSOR) MISC, 1 Device by Does not apply route every 14 (fourteen) days., Disp: 6 each, Rfl: 3   empagliflozin (JARDIANCE) 10 MG TABS tablet, Take 1 tablet (10 mg total) by mouth daily before breakfast., Disp: 90 tablet, Rfl: 1   gabapentin (NEURONTIN) 300 MG capsule, One po qAM,one po qPm and two po qHS, Disp: 120 capsule, Rfl: 5   Insulin Pen Needle (B-D UF III MINI PEN NEEDLES) 31G X 5 MM MISC, 1 Device by Does not apply route in the morning, at noon, in the evening, and at bedtime., Disp: 400 each, Rfl: 3   isosorbide dinitrate (ISORDIL) 30 MG tablet, Take 30 mg by mouth 2 (two) times daily., Disp: , Rfl:    LANTUS SOLOSTAR 100 UNIT/ML Solostar Pen, Inject 15 Units into the skin at bedtime., Disp: 15 mL, Rfl: 3   levothyroxine (SYNTHROID) 150 MCG tablet, Take 1 tablet (150 mcg total) by mouth daily before breakfast., Disp: 90 tablet, Rfl: 3   metoprolol succinate (TOPROL-XL) 50 MG 24 hr tablet, Take 50 mg by mouth daily., Disp: , Rfl:    MITIGARE 0.6 MG CAPS, Take 0.6 mg by mouth every other day., Disp: , Rfl:    nitroGLYCERIN (NITROSTAT) 0.4 MG SL tablet, Place 1 tablet (0.4 mg total) under the tongue every 5 (five) minutes as needed for chest pain (up to 3 doses)., Disp: 25 tablet, Rfl: 3   NOVOLOG FLEXPEN 100 UNIT/ML FlexPen, Max Daily 60 units (Patient taking differently: 14 Units. Max Daily 60 units), Disp: 30 mL, Rfl: 1   Omega-3 Fatty Acids (FISH OIL) 500 MG CAPS, Take 1 capsule by mouth daily.,  Disp: , Rfl:    OZEMPIC, 2 MG/DOSE, 8 MG/3ML SOPN, Inject 1 mg into the skin once a week., Disp: , Rfl:    rosuvastatin (CRESTOR) 40 MG tablet, Take 40 mg by mouth daily., Disp: , Rfl:    traZODone (DESYREL) 50 MG tablet, Take 50 mg by mouth at bedtime as needed for sleep., Disp: , Rfl:   Orders Placed This Encounter  Procedures   EKG 12-Lead     There are no Patient Instructions on file for this visit.   --Continue cardiac medications as reconciled in final medication list. --  Return in about 6 months (around 01/04/2022) for Follow up, CAD. Or sooner if needed. --Continue follow-up with your primary care physician regarding the management of your other chronic comorbid conditions.  Patient's questions and concerns were addressed to her satisfaction. She voices understanding of the instructions provided during this encounter.   This note was created using a voice recognition software as a result there may be grammatical errors inadvertently enclosed that do not reflect the nature of this encounter. Every attempt is made to correct such errors.  Rex Kras, [place], [facility]  Pager: [phone] Office: [phone]

## 2021-07-23 ENCOUNTER — Ambulatory Visit (INDEPENDENT_AMBULATORY_CARE_PROVIDER_SITE_OTHER): Payer: Medicare HMO | Admitting: Internal Medicine

## 2021-07-23 ENCOUNTER — Encounter: Payer: Self-pay | Admitting: Internal Medicine

## 2021-07-23 VITALS — BP 110/76 | HR 71 | Ht 68.0 in | Wt 295.0 lb

## 2021-07-23 DIAGNOSIS — E1159 Type 2 diabetes mellitus with other circulatory complications: Secondary | ICD-10-CM | POA: Diagnosis not present

## 2021-07-23 DIAGNOSIS — E039 Hypothyroidism, unspecified: Secondary | ICD-10-CM

## 2021-07-23 DIAGNOSIS — E1142 Type 2 diabetes mellitus with diabetic polyneuropathy: Secondary | ICD-10-CM

## 2021-07-23 DIAGNOSIS — Z794 Long term (current) use of insulin: Secondary | ICD-10-CM | POA: Diagnosis not present

## 2021-07-23 LAB — BASIC METABOLIC PANEL
BUN: 19 mg/dL (ref 6–23)
CO2: 21 mEq/L (ref 19–32)
Calcium: 9.3 mg/dL (ref 8.4–10.5)
Chloride: 108 mEq/L (ref 96–112)
Creatinine, Ser: 1.19 mg/dL (ref 0.40–1.20)
GFR: 49.48 mL/min — ABNORMAL LOW (ref >60.00)
Glucose, Bld: 137 mg/dL — ABNORMAL HIGH (ref 70–99)
Potassium: 3.8 mEq/L (ref 3.5–5.1)
Sodium: 139 mEq/L (ref 135–145)

## 2021-07-23 LAB — POCT GLYCOSYLATED HEMOGLOBIN (HGB A1C): Hemoglobin A1C: 6.9 % — AB (ref 4.0–5.6)

## 2021-07-23 LAB — TSH: TSH: 0.9 u[IU]/mL (ref 0.35–5.50)

## 2021-07-23 LAB — POCT GLUCOSE (DEVICE FOR HOME USE): Glucose Fasting, POC: 140 mg/dL — AB (ref 70–99)

## 2021-07-23 MED ORDER — EMPAGLIFLOZIN 25 MG PO TABS: 25.0000 mg | ORAL_TABLET | Freq: Every day | ORAL | 3 refills | Status: DC

## 2021-07-23 NOTE — Progress Notes (Signed)
Name: [name]  MRN/ DOB: [id number], [birth year]   Age/ Sex: 61 y.o., female    PCP: Benito Mccreedy, MD   Reason for Endocrinology Evaluation: Type 2 Diabetes Mellitus     Date of Initial Endocrinology Visit: 03/16/2021    PATIENT IDENTIFIER: [name] is a 61 y.o. female with a past medical history of T2DM, HTN, dyslipidemia and CAD (status post PCI). The patient presented for initial endocrinology clinic visit on 03/16/2021 for consultative assistance with her diabetes management.    HPI: [name] was    Diagnosed with DM 2013 Prior Medications tried/Intolerance: Metformin- diarrhea  as well as hand and feet swelling. Ozempic - no  Currently checking blood sugars 4-5 x / day,  .  Hypoglycemia episodes : no                Hemoglobin A1c has ranged from 7.5% in 2022, peaking at 11.5% in 2013.  No prior hx of pancreatitis     On her initial visit to our clinic her A1c was 7.5%, we adjusted MDI regimen and started Jardiance.  Trulicity was continued  THYROID HISTORY: She has been diagnosed with hypothyroid years ago ~ 2013. No prior neck sx or radiation.   Denies FH of thyroid disease      SUBJECTIVE:   During the last visit (03/16/2021): A1c 7.5%, adjusted MDI regimen, continue Trulicity, started Jardiance  Today (07/23/21): [name] is here for follow-up on diabetes management.she checks her blood sugars 4 times daily. Has not been using The patient has been cost prohibitive  . She denies any hypoglycemic episodes since the last clinic visit.   Weight is gradually trending down  PCP's office changed Trulicity to Ozempic , tolerating well  Pending right hip sx    HOME ENDOCRINE REGIMEN: Lantus 15 units once daily  NovoLog 14 units TID QAC Ozempic 1 mg weekly ( Friday )  Jardiance 10 mg daily Levothyroxine 150 mcg daily      Statin: Yes ACE-I/ARB: No Prior Diabetic Education: yes   METER DOWNLOAD SUMMARY:  Range 120-150 mg/dL    DIABETIC COMPLICATIONS: Microvascular complications:  Neuropathy  Denies: retinopathy, CKD  Last eye exam: Completed 12/2020  Macrovascular complications:  CAD(status post PCI)  Denies: PVD, CVA   PAST HISTORY: Past Medical History:  Past Medical History:  Diagnosis Date   ALLERGIC RHINITIS    Allergy to IVP dye    Coronary artery disease    Diabetes mellitus, type 2 ([place])    History of tobacco abuse    Quit 2011   Hypertension    Hypertriglyceridemia    Hypothyroidism    NSTEMI (non-ST elevated myocardial infarction) ([place]) 09/30/2011   NO CAD by cath, ? coronary vasospasm   Obesity    Sleep apnea    Past Surgical History:  Past Surgical History:  Procedure Laterality Date   CORONARY STENT INTERVENTION N/A 08/19/2017   Procedure: CORONARY STENT INTERVENTION;  Surgeon: [name], MD;  Location: [facility];  Service: Cardiovascular;  Laterality: N/A;   LEFT HEART CATH AND CORONARY ANGIOGRAPHY N/A 08/19/2017   Procedure: LEFT HEART CATH AND CORONARY ANGIOGRAPHY;  Surgeon: [name], MD;  Location: [facility];  Service: Cardiovascular;  Laterality: N/A;   LEFT HEART CATHETERIZATION WITH CORONARY ANGIOGRAM N/A 09/30/2011   Procedure: LEFT HEART CATHETERIZATION WITH CORONARY ANGIOGRAM;  Surgeon: Burnell Blanks, MD;  Location: [facility];  Service: Cardiovascular;  Laterality: N/A;   LUMBAR DISC SURGERY  11/27/2020   L4-5   TUBAL LIGATION     VESICOVAGINAL FISTULA CLOSURE W/ TAH      Social History:  reports that she quit smoking about 10 years ago. Her smoking use included cigarettes. She has a 28.80 pack-year smoking history. She has never used smokeless tobacco. She reports that she does not currently use alcohol. She reports that she does not use drugs. Family History:  Family History  Problem Relation Age of Onset   Heart disease Mother        has pacemaker   Diabetes Mother    Hypertension Brother    Diabetes Brother    Hypertension  Brother    Cancer Maternal Uncle    Diabetes Other        strong family history     HOME MEDICATIONS: Allergies as of 07/23/2021       Reactions   Contrast Media [iodinated Contrast Media] Swelling   Skin Peeling and break out   Codeine Itching   Other    NO PLASTIC TAPE - Blisters and skin removal        Medication List        Accurate as of July 23, 2021  8:40 AM. If you have any questions, ask your nurse or doctor.          amLODipine-valsartan 5-160 MG tablet Commonly known as: EXFORGE Take 1 tablet by mouth daily.   aspirin EC 81 MG tablet Take 81 mg by mouth daily.   B-D UF III MINI PEN NEEDLES 31G X 5 MM Misc Generic drug: Insulin Pen Needle 1 Device by Does not apply route in the morning, at noon, in the evening, and at bedtime.   empagliflozin 10 MG Tabs tablet Commonly known as: Jardiance Take 1 tablet (10 mg total) by mouth daily before breakfast.   Fish Oil 500 MG Caps Take 1 capsule by mouth daily.   FreeStyle Libre 2 Sensor Misc 1 Device by Does not apply route every 14 (fourteen) days.   gabapentin 300 MG capsule Commonly known as: NEURONTIN One po qAM,one po qPm and two po qHS   isosorbide dinitrate 30 MG tablet Commonly known as: ISORDIL Take 30 mg by mouth 2 (two) times daily.   Lantus SoloStar 100 UNIT/ML Solostar Pen Generic drug: insulin glargine Inject 15 Units into the skin at bedtime.   levothyroxine 150 MCG tablet Commonly known as: SYNTHROID Take 1 tablet (150 mcg total) by mouth daily before breakfast.   metoprolol succinate 50 MG 24 hr tablet Commonly known as: TOPROL-XL Take 50 mg by mouth daily.   Mitigare 0.6 MG Caps Generic drug: Colchicine Take 0.6 mg by mouth every other day.   nitroGLYCERIN 0.4 MG SL tablet Commonly known as: NITROSTAT Place 1 tablet (0.4 mg total) under the tongue every 5 (five) minutes as needed for chest pain (up to 3 doses).   NovoLOG FlexPen 100 UNIT/ML FlexPen Generic drug: insulin  aspart Max Daily 60 units What changed: how much to take   Ozempic (2 MG/DOSE) 8 MG/3ML Sopn Generic drug: Semaglutide (2 MG/DOSE) Inject 1 mg into the skin once a week.   rosuvastatin 40 MG tablet Commonly known as: CRESTOR Take 40 mg by mouth daily.   traZODone 50 MG tablet Commonly known as: DESYREL Take 50 mg by mouth at bedtime as needed for sleep.   vitamin D3 50 MCG (2000 UT) Caps Take 1 each by mouth daily.         ALLERGIES: Allergies  Allergen Reactions   Contrast Media [Iodinated Contrast Media] Swelling    Skin Peeling and break out   Codeine Itching   Other     NO PLASTIC TAPE - Blisters and skin removal     REVIEW OF SYSTEMS: A comprehensive ROS was conducted with the patient and is negative except as per HPI and    OBJECTIVE:   VITAL SIGNS: BP 110/76 (BP Location: Left Arm, Patient Position: Sitting, Cuff Size: Large)   Pulse 71   Ht '5\' 8"'$  (1.727 m)   Wt 295 lb (133.8 kg)   SpO2 96%   BMI 44.85 kg/m    PHYSICAL EXAM:  General: [name] appears well and is in NAD  Neck: General: Supple without adenopathy or carotid bruits. Thyroid: Thyroid size normal.  No goiter or nodules appreciated. No thyroid bruit.  Lungs: Clear with good BS bilat with no rales, rhonchi, or wheezes  Heart: RRR   Extremities:  Lower extremities - No pretibial edema. No lesions.  Neuro: MS is good with appropriate affect, [name] is alert and Ox3   DM Foot Exam 07/23/2021   The skin of the feet is intact without sores or ulcerations. The pedal pulses are 2+ on right and 2+ on left. The sensation is intact to a screening 5.07, 10 gram monofilament bilaterally   DATA REVIEWED:  Lab Results  Component Value Date   HGBA1C 6.9 (A) 07/23/2021   HGBA1C 7.5 (A) 03/16/2021   HGBA1C 7.5 (H) 11/27/2020     Latest Reference Range & Units 07/23/21 09:04  Sodium 135 - 145 mEq/L 139  Potassium 3.5 - 5.1 mEq/L 3.8  Chloride 96 - 112 mEq/L 108  CO2 19 - 32 mEq/L 21  Glucose 70 - 99  mg/dL 137 (H)  BUN 6 - 23 mg/dL 19  Creatinine 0.40 - 1.20 mg/dL 1.19  Calcium 8.4 - 10.5 mg/dL 9.3  GFR >60.00 mL/min 49.48 (L)    Latest Reference Range & Units 07/23/21 09:04  TSH 0.35 - 5.50 uIU/mL 0.90        Latest Reference Range & Units 03/16/21 09:34  Creatinine,U mg/dL 107.8  Microalb, Ur 0.0 - 1.9 mg/dL 1.0  MICROALB/CREAT RATIO 0.0 - 30.0 mg/g 0.9     ASSESSMENT / PLAN / RECOMMENDATIONS:   1) Type 2 Diabetes Mellitus, Optimally controlled, With Neuropathic and  macrovascular complications - Most recent A1c of 6.9%. Goal A1c <7.0%.    - Praised the [name] on optimizing glucose control  -GFR is lower than usual, we will continue to monitor -We will reduce prandial dose of insulin, she will be provided with a correction scale to be used as needed for NovoLog -I am going to increase the Jardiance  MEDICATIONS: Continue Lantus 15 units daily Decrease NovoLog to 10units 3 times daily with each meal Continue Ozempic 1 mg mg weekly Increase Jardiance 15 mg 1 tablet daily Correction factor: NovoLog (BG -130/35)   EDUCATION / INSTRUCTIONS: BG monitoring instructions: Patient is instructed to check her blood sugars 3 times a day, fasting. Call [facility] if: BG persistently < 70  I reviewed the Rule of 15 for the treatment of hypoglycemia in detail with the patient. Literature supplied.   2) Diabetic complications:  Eye: Does not have known diabetic retinopathy.  Neuro/ Feet: Does  have known diabetic peripheral neuropathy. Renal: Patient does not have known baseline CKD, but her GFR fluctuates. She is not on an ACEI/ARB at present.  3) Hypothyroidism:  -TSH normal  Medication  Continue levothyroxine 150 mcg daily    4) Dyslipidemia :  -LDL optimal at 22 mg/dL    Medication Continue rosuvastatin 40 mg daily     Signed electronically by: Mack Guise, MD  [facility]  [facility] [place], [place] [address] Phone: [phone] FAX: [phone]   CC: Benito Mccreedy, [address] [place] [address] Phone: [phone]  Fax: [phone]    Return to Endocrinology clinic as below: Future Appointments  Date Time Provider [place]  10/24/2021  8:30 AM Jessy Oto, MD [facility] None  01/02/2022 10:30 AM Rex Kras, DO [facility] None

## 2021-07-23 NOTE — Patient Instructions (Addendum)
-   Continue Lantus 15 daily - Decrease NOvolog to 10 units with each meal  - Continue Ozempic 1 mg weekly  - Increase Jardiance 25 mg, 1 tablet daily  -Novolog correctional insulin: ADD extra units on insulin to your meal-time Novolog dose if your blood sugars are higher than 165. Use the scale below to help guide you:   Blood sugar before meal Number of units to inject  Less than 165 0 unit  166 -  200 1 units  201 -  235 2 units  236 -  270 3 units  271 -  305 4 units  306 -  340 5 units  341 -  375 6 units  376 -  410 7 units      HOW TO TREAT LOW BLOOD SUGARS (Blood sugar LESS THAN 70 MG/DL) Please follow the RULE OF 15 for the treatment of hypoglycemia treatment (when your (blood sugars are less than 70 mg/dL)   STEP 1: Take 15 grams of carbohydrates when your blood sugar is low, which includes:  3-4 GLUCOSE TABS  OR 3-4 OZ OF JUICE OR REGULAR SODA OR ONE TUBE OF GLUCOSE GEL    STEP 2: RECHECK blood sugar in 15 MINUTES STEP 3: If your blood sugar is still low at the 15 minute recheck --> then, go back to STEP 1 and treat AGAIN with another 15 grams of carbohydrates.

## 2021-08-13 DIAGNOSIS — E039 Hypothyroidism, unspecified: Secondary | ICD-10-CM | POA: Diagnosis not present

## 2021-08-13 DIAGNOSIS — E782 Mixed hyperlipidemia: Secondary | ICD-10-CM | POA: Diagnosis not present

## 2021-08-13 DIAGNOSIS — Z794 Long term (current) use of insulin: Secondary | ICD-10-CM | POA: Diagnosis not present

## 2021-08-13 DIAGNOSIS — Z720 Tobacco use: Secondary | ICD-10-CM | POA: Diagnosis not present

## 2021-08-13 DIAGNOSIS — I10 Essential (primary) hypertension: Secondary | ICD-10-CM | POA: Diagnosis not present

## 2021-08-13 DIAGNOSIS — E1165 Type 2 diabetes mellitus with hyperglycemia: Secondary | ICD-10-CM | POA: Diagnosis not present

## 2021-08-13 DIAGNOSIS — I2510 Atherosclerotic heart disease of native coronary artery without angina pectoris: Secondary | ICD-10-CM | POA: Diagnosis not present

## 2021-08-13 DIAGNOSIS — Z0000 Encounter for general adult medical examination without abnormal findings: Secondary | ICD-10-CM | POA: Diagnosis not present

## 2021-08-15 ENCOUNTER — Emergency Department (HOSPITAL_COMMUNITY)
Admission: EM | Admit: 2021-08-15 | Discharge: 2021-08-15 | Disposition: A | Discharge status: Home / Self Care | Payer: Medicare HMO | Attending: Emergency Medicine | Admitting: Emergency Medicine

## 2021-08-15 ENCOUNTER — Other Ambulatory Visit: Payer: Self-pay

## 2021-08-15 ENCOUNTER — Encounter (HOSPITAL_COMMUNITY): Payer: Self-pay

## 2021-08-15 ENCOUNTER — Emergency Department (HOSPITAL_COMMUNITY)
Admission: EM | Admit: 2021-08-15 | Discharge: 2021-08-15 | Discharge status: Against Medical Advice | Payer: Medicare HMO

## 2021-08-15 ENCOUNTER — Emergency Department (HOSPITAL_COMMUNITY): Payer: Medicare HMO

## 2021-08-15 DIAGNOSIS — R519 Headache, unspecified: Secondary | ICD-10-CM | POA: Insufficient documentation

## 2021-08-15 DIAGNOSIS — Z79899 Other long term (current) drug therapy: Secondary | ICD-10-CM | POA: Insufficient documentation

## 2021-08-15 DIAGNOSIS — Z7984 Long term (current) use of oral hypoglycemic drugs: Secondary | ICD-10-CM | POA: Diagnosis not present

## 2021-08-15 DIAGNOSIS — M542 Cervicalgia: Secondary | ICD-10-CM | POA: Insufficient documentation

## 2021-08-15 DIAGNOSIS — I10 Essential (primary) hypertension: Secondary | ICD-10-CM | POA: Diagnosis not present

## 2021-08-15 DIAGNOSIS — Z7982 Long term (current) use of aspirin: Secondary | ICD-10-CM | POA: Insufficient documentation

## 2021-08-15 DIAGNOSIS — R110 Nausea: Secondary | ICD-10-CM | POA: Insufficient documentation

## 2021-08-15 DIAGNOSIS — I2510 Atherosclerotic heart disease of native coronary artery without angina pectoris: Secondary | ICD-10-CM | POA: Diagnosis not present

## 2021-08-15 DIAGNOSIS — E119 Type 2 diabetes mellitus without complications: Secondary | ICD-10-CM | POA: Insufficient documentation

## 2021-08-15 DIAGNOSIS — M546 Pain in thoracic spine: Secondary | ICD-10-CM | POA: Insufficient documentation

## 2021-08-15 DIAGNOSIS — Z794 Long term (current) use of insulin: Secondary | ICD-10-CM | POA: Diagnosis not present

## 2021-08-15 DIAGNOSIS — E038 Other specified hypothyroidism: Secondary | ICD-10-CM | POA: Diagnosis not present

## 2021-08-15 MED ORDER — DIPHENHYDRAMINE HCL 25 MG PO CAPS
25.0000 mg | ORAL_CAPSULE | Freq: Once | ORAL | Status: AC
  Administered 2021-08-15: 25 mg via ORAL
  Filled 2021-08-15: qty 1

## 2021-08-15 MED ORDER — METHOCARBAMOL 500 MG PO TABS: 1000.0000 mg | ORAL_TABLET | Freq: Three times a day (TID) | ORAL | 0 refills | Status: DC | PRN

## 2021-08-15 MED ORDER — METOCLOPRAMIDE HCL 5 MG/ML IJ SOLN
10.0000 mg | Freq: Once | INTRAMUSCULAR | Status: AC
Start: 2021-08-15 — End: 2021-08-15
  Administered 2021-08-15: 10 mg via INTRAMUSCULAR
  Filled 2021-08-15: qty 2

## 2021-08-15 NOTE — ED Triage Notes (Signed)
Pt states that she began having a headache yesterday. Intermittent nausea. Pt denies vision changes.

## 2021-08-15 NOTE — Discharge Instructions (Signed)
Please read and follow all provided instructions.  Your diagnoses today include:  1. Acute nonintractable headache, unspecified headache type     Tests performed today include: CT of your head which was normal and did not show any serious cause of your headache Vital signs. See below for your results today.   Medications:  In the Emergency Department you received: Reglan - antinausea/headache medication Benadryl - antihistamine to counteract potential side effects of reglan  Robaxin (methocarbamol) - muscle relaxer medication  DO NOT drive or perform any activities that require you to be awake and alert because this medicine can make you drowsy.    Take any prescribed medications only as directed.  Additional information:  Follow any educational materials contained in this packet.  You are having a headache. No specific cause was found today for your headache. It may have been a migraine or other cause of headache. Stress, anxiety, fatigue, and depression are common triggers for headaches.   Your headache today does not appear to be life-threatening or require hospitalization, but often the exact cause of headaches is not determined in the emergency department. Therefore, follow-up with your doctor is very important to find out what may have caused your headache and whether or not you need any further diagnostic testing or treatment.   Sometimes headaches can appear benign (not harmful), but then more serious symptoms can develop which should prompt an immediate re-evaluation by your doctor or the emergency department.  BE VERY CAREFUL not to take multiple medicines containing Tylenol (also called acetaminophen). Doing so can lead to an overdose which can damage your liver and cause liver failure and possibly death.   Follow-up instructions: Please follow-up with your primary care provider in the next 3 days for further evaluation of your symptoms.   Return instructions:  Please  return to the Emergency Department if you experience worsening symptoms. Return if the medications do not resolve your headache, if it recurs, or if you have multiple episodes of vomiting or cannot keep down fluids. Return if you have a change from the usual headache. RETURN IMMEDIATELY IF you: Develop a sudden, severe headache Develop confusion or become poorly responsive or faint Develop a fever above 100.5F or problem breathing Have a change in speech, vision, swallowing, or understanding Develop new weakness, numbness, tingling, incoordination in your arms or legs Have a seizure Please return if you have any other emergent concerns.  Additional Information:  Your vital signs today were: BP (!) 156/87 (BP Location: Right Arm)   Pulse 82   Temp 98.3 F (36.8 C) (Oral)   Resp 17   SpO2 100%  If your blood pressure (BP) was elevated above 135/85 this visit, please have this repeated by your doctor within one month. --------------

## 2021-08-15 NOTE — ED Notes (Signed)
Patient stated she is leaving the ED and going to [facility]. Pt encouraged to stay; however, pt  declined.

## 2021-08-15 NOTE — ED Provider Notes (Signed)
[facility] DEPT Provider Note   CSN: [id number] Arrival date & time: 08/15/21  1628     History  Chief Complaint  Patient presents with   Headache    [name] is a 61 y.o. female.  [name], a 61 y.o. female was evaluated in triage by myself on arrival.  She has a past medical history of diabetes, coronary artery disease status post stenting, hypertension.  Denies current anticoagulation or antiplatelet meds.  Pt complains of headache and neck pain.  Symptoms started yesterday morning when she awoke.  She reports pain across the top of her head as well as bilateral neck.  She has had some nausea and light sensitivity, but states that this feels different than headaches that she has had in the past.  Reports a history of remote migraine.  No vision changes, weakness, difficulty with speech or with ambulation.  She states that it hurts more to turn her head and neck.  No fevers.  Upon awaking this morning, pain was more severe, prompting ED visit today.  She rates the pain 100 out of 10.      Home Medications Prior to Admission medications   Medication Sig Start Date End Date Taking? Authorizing Provider  amLODipine-valsartan (EXFORGE) 5-160 MG tablet Take 1 tablet by mouth daily.    [provider]  aspirin EC 81 MG tablet Take 81 mg by mouth daily.    [provider]  Cholecalciferol (VITAMIN D3) 50 MCG (2000 UT) CAPS Take 1 each by mouth daily.    [provider]  Continuous Blood Gluc Sensor (FREESTYLE LIBRE 2 SENSOR) MISC 1 Device by Does not apply route every 14 (fourteen) days. 03/16/21   Shamleffer, Melanie Crazier, MD  empagliflozin (JARDIANCE) 25 MG TABS tablet Take 1 tablet (25 mg total) by mouth daily before breakfast. 07/23/21   Shamleffer, Melanie Crazier, MD  gabapentin (NEURONTIN) 300 MG capsule One po qAM,one po qPm and two po qHS 02/21/21   Sater, Nanine Means, MD  Insulin Pen Needle (B-D UF III MINI  PEN NEEDLES) 31G X 5 MM MISC 1 Device by Does not apply route in the morning, at noon, in the evening, and at bedtime. 03/16/21   Shamleffer, Melanie Crazier, MD  isosorbide dinitrate (ISORDIL) 30 MG tablet Take 30 mg by mouth 2 (two) times daily. 11/02/20   [provider]  LANTUS SOLOSTAR 100 UNIT/ML Solostar Pen Inject 15 Units into the skin at bedtime. 03/16/21   Shamleffer, Melanie Crazier, MD  levothyroxine (SYNTHROID) 150 MCG tablet Take 1 tablet (150 mcg total) by mouth daily before breakfast. 03/16/21   Shamleffer, Melanie Crazier, MD  metoprolol succinate (TOPROL-XL) 50 MG 24 hr tablet Take 50 mg by mouth daily. 08/26/18   [provider]  MITIGARE 0.6 MG CAPS Take 0.6 mg by mouth every other day. 03/01/18   [provider]  nitroGLYCERIN (NITROSTAT) 0.4 MG SL tablet Place 1 tablet (0.4 mg total) under the tongue every 5 (five) minutes as needed for chest pain (up to 3 doses). 10/01/11 08/18/25  Hope, Jessica A, PA-C  NOVOLOG FLEXPEN 100 UNIT/ML FlexPen Max Daily 60 units Patient taking differently: 14 Units. Max Daily 60 units 03/16/21   Shamleffer, Melanie Crazier, MD  Omega-3 Fatty Acids (FISH OIL) 500 MG CAPS Take 1 capsule by mouth daily.    [provider]  OZEMPIC, 2 MG/DOSE, 8 MG/3ML SOPN Inject 1 mg into the skin once a week. 06/12/21   [provider]  rosuvastatin (CRESTOR) 40 MG tablet Take 40 mg by mouth daily.    [provider]  traZODone (DESYREL) 50 MG tablet Take 50 mg by mouth at bedtime as needed for sleep. 12/25/19   [provider]      Allergies    Contrast media [iodinated contrast media], Codeine, and Other    Review of Systems   Review of Systems  Physical Exam Updated Vital Signs BP (!) 156/87 (BP Location: Right Arm)   Pulse 82   Temp 98.3 F (36.8 C) (Oral)   Resp 17   SpO2 100%  Physical Exam Vitals and nursing note reviewed.  Constitutional:      Appearance: She is well-developed.  HENT:      Head: Normocephalic and atraumatic.     Right Ear: Tympanic membrane, ear canal and external ear normal.     Left Ear: Tympanic membrane, ear canal and external ear normal.     Nose: Nose normal.     Mouth/Throat:     Pharynx: Uvula midline.  Eyes:     General: Lids are normal.     Extraocular Movements:     Right eye: No nystagmus.     Left eye: No nystagmus.     Conjunctiva/sclera: Conjunctivae normal.     Pupils: Pupils are equal, round, and reactive to light.  Cardiovascular:     Rate and Rhythm: Normal rate and regular rhythm.  Pulmonary:     Effort: Pulmonary effort is normal.     Breath sounds: Normal breath sounds.  Abdominal:     Palpations: Abdomen is soft.     Tenderness: There is no abdominal tenderness.  Musculoskeletal:     Cervical back: Neck supple. Tenderness present. No bony tenderness. Decreased range of motion.     Thoracic back: Tenderness present. No bony tenderness. Normal range of motion.  Skin:    General: Skin is warm and dry.  Neurological:     Mental Status: She is alert and oriented to person, place, and time.     GCS: GCS eye subscore is 4. GCS verbal subscore is 5. GCS motor subscore is 6.     Cranial Nerves: No cranial nerve deficit.     Sensory: No sensory deficit.     Motor: No weakness.     Coordination: Coordination normal.     Gait: Gait normal.     Comments: Upper extremity myotomes tested bilaterally:  C5 Shoulder abduction 5/5 C6 Elbow flexion/wrist extension 5/5 C7 Elbow extension 5/5 C8 Finger flexion 5/5 T1 Finger abduction 5/5  Lower extremity myotomes tested bilaterally: L2 Hip flexion 5/5 L3 Knee extension 5/5 L4 Ankle dorsiflexion 5/5 S1 Ankle plantar flexion 5/5     ED Results / Procedures / Treatments   Labs (all labs ordered are listed, but only abnormal results are displayed) Labs Reviewed - No data to display  EKG None  Radiology CT HEAD WO CONTRAST (5MM)  Result Date: 08/15/2021 CLINICAL DATA:  Headache.  EXAM: CT HEAD WITHOUT CONTRAST TECHNIQUE: Contiguous axial images were obtained from the base of the skull through the vertex without intravenous contrast. RADIATION DOSE REDUCTION: This exam was performed according to the departmental dose-optimization program which includes automated exposure control, adjustment of the mA and/or kV according to patient size and/or use of iterative reconstruction technique. COMPARISON:  Jun 22, 2010 FINDINGS: Brain: No evidence of acute infarction, hemorrhage, hydrocephalus, extra-axial collection or mass lesion/mass effect. Vascular: No hyperdense vessel or unexpected calcification. Skull: Normal.  Negative for fracture or focal lesion. Sinuses/Orbits: No acute finding. Other: None. IMPRESSION: No acute intracranial abnormality. Electronically Signed   By: Fidela Salisbury M.D.   On: 08/15/2021 17:03    Procedures Procedures    Medications Ordered in ED Medications  metoCLOPramide (REGLAN) injection 10 mg (has no administration in time range)  diphenhydrAMINE (BENADRYL) capsule 25 mg (has no administration in time range)    ED Course/ Medical Decision Making/ A&P    Patient seen and examined. History obtained directly from patient. Work-up including labs, imaging, EKG ordered in triage, if performed, were reviewed.    Labs/EKG: None ordered  Imaging: Independently visualized and interpreted.  This included: CT of the head without contrast, agree negative.  Medications/Fluids: Ordered: IM Reglan, p.o. Benadryl.  Most recent vital signs reviewed and are as follows: BP (!) 156/87 (BP Location: Right Arm)   Pulse 82   Temp 98.3 F (36.8 C) (Oral)   Resp 17   SpO2 100%   Initial impression: Headache, no red flags other than age.  Patient has been in the emergency department for 3 and half hours.  Her general and neurologic exams are stable since arrival.  9:43 PM Reassessment performed. Patient appears stable.  Tolerated medications well.  Plan:  Discharge to home.  She is calling her ride.  Prescriptions written for: Robaxin.  Patient counseled on proper use of muscle relaxant medication.  They were told not to drink alcohol, drive any vehicle, or do any dangerous activities while taking this medication.  Patient verbalized understanding.  Other home care instructions discussed: Rest, avoidance of triggers  ED return instructions discussed: Patient counseled to return if they have weakness in their arms or legs, slurred speech, trouble walking or talking, confusion, trouble with their balance, or if they have any other concerns. Patient verbalizes understanding and agrees with plan.   Follow-up instructions discussed: Patient encouraged to follow-up with their PCP in 3 days.                           Medical Decision Making Amount and/or Complexity of Data Reviewed Radiology: ordered.  Risk Prescription drug management.   In regards to the patient's headache, critical differentials were considered including subarachnoid hemorrhage, intracerebral hemorrhage, epidural/subdural hematoma, pituitary apoplexy, vertebral/carotid artery dissection, giant cell arteritis, central venous thrombosis, reversible cerebral vasoconstriction, acute angle closure glaucoma, idiopathic intracranial hypertension, bacterial meningitis, viral encephalitis, carbon monoxide poisoning, posterior reversible encephalopathy syndrome, pre-eclampsia.   Reg flag symptoms related to these causes were considered including systemic symptoms (fever, weight loss), neurologic symptoms (confusion, mental status change, vision change, associated seizure), acute or sudden "thunderclap" onset, patient age 58 or older with new or progressive headache, patient of any age with first headache or change in headache pattern, pregnant or postpartum status, history of HIV or other immunocompromise, history of cancer, headache occurring with exertion, associated neck or shoulder pain,  associated traumatic injury, concurrent use of anticoagulation, family history of spontaneous SAH, and concurrent drug use.    Other benign, more common causes of headache were considered including migraine, tension-type headache, cluster headache, referred pain from other cause such as sinus infection, dental pain, trigeminal neuralgia.   On exam, patient has a reassuring neuro exam including baseline mental status, no meningeal signs, no signs of severe infection or fever.   The patient's vital signs, pertinent lab work and imaging were reviewed and interpreted as discussed in the ED course. Hospitalization was considered for  further testing, treatments, or serial exams/observation. However as patient is well-appearing, has a stable exam over the course of their evaluation, and reassuring studies today, I do not feel that they warrant admission at this time. This plan was discussed with the patient who verbalizes agreement and comfort with this plan and seems reliable and able to return to the Emergency Department with worsening or changing symptoms.         Final Clinical Impression(s) / ED Diagnoses Final diagnoses:  Acute nonintractable headache, unspecified headache type    Rx / DC Orders ED Discharge Orders          Ordered    methocarbamol (ROBAXIN) 500 MG tablet  Every 8 hours PRN        08/15/21 2114              Carlisle Cater, PA-C 08/15/21 2144    Drenda Freeze, MD 08/15/21 2328

## 2021-08-15 NOTE — ED Provider Triage Note (Signed)
Emergency Medicine Provider Triage Evaluation Note  [name] , a 61 y.o. female  was evaluated in triage.  Pt complains of headache and neck pain.  Symptoms started yesterday morning when she awoke.  She reports pain across the top of her head as well as bilateral neck.  She has had some nausea and light sensitivity, but states that this feels different than headaches that she has had in the past.  No vision changes, weakness, difficulty with speech or with ambulation.  She states that it hurts more to turn her head and neck.  No fevers.  Upon awaking this morning, pain was more severe, prompting ED visit today.  She rates the pain 100 out of 10.  Review of Systems  Positive: Headache Negative: Fever, vomiting  Physical Exam  There were no vitals taken for this visit. Gen:   Awake, no distress   Resp:  Normal effort  MSK:   Moves extremities without difficulty  Other:  Patient tender to palpation over the paraspinous musculature of the cervical spine, no distress.  No temporal tenderness to palpation.  Medical Decision Making  Medically screening exam initiated at 4:47 PM.  Appropriate orders placed.  [name] was informed that the remainder of the evaluation will be completed by another provider, this initial triage assessment does not replace that evaluation, and the importance of remaining in the ED until their evaluation is complete.     Carlisle Cater, PA-C 08/15/21 1648

## 2021-09-06 ENCOUNTER — Other Ambulatory Visit: Payer: Self-pay | Admitting: Internal Medicine

## 2021-09-10 ENCOUNTER — Telehealth: Payer: Self-pay | Admitting: Orthopaedic Surgery

## 2021-09-10 NOTE — Telephone Encounter (Signed)
Called patient left message to return call to schedule an appointment with Dr. Ninfa Linden per [place] message

## 2021-09-10 NOTE — Telephone Encounter (Signed)
Patient left mychart message stating she has lost 47 pounds so far and need a hip replacement mobility is limited.  I called patient left message to return call.   Number to contact patient is [phone]

## 2021-09-13 ENCOUNTER — Ambulatory Visit (INDEPENDENT_AMBULATORY_CARE_PROVIDER_SITE_OTHER): Payer: Medicare HMO | Admitting: Physician Assistant

## 2021-09-13 ENCOUNTER — Encounter: Payer: Self-pay | Admitting: Physician Assistant

## 2021-09-13 VITALS — Ht 68.0 in | Wt 285.0 lb

## 2021-09-13 DIAGNOSIS — Z6841 Body Mass Index (BMI) 40.0 and over, adult: Secondary | ICD-10-CM | POA: Diagnosis not present

## 2021-09-13 DIAGNOSIS — M1611 Unilateral primary osteoarthritis, right hip: Secondary | ICD-10-CM

## 2021-09-13 NOTE — Progress Notes (Signed)
HPI: [name] comes in today for height and weight check.  She has known right hip osteoarthritis and has been working on getting her weight down so she can undergo a right total hip arthroplasty.  She has had no falls or injuries.  She does report that she has lost weight and has gained better control of her glucose levels.  Reports her last hemoglobin A1c was 6.7.  She has signed up for Silver sneakers.  She is using the treadmill for exercise at this point.  Review of systems see HPI otherwise negative or noncontributory.  Physical exam: Height 5 foot 8 inches weight 285 pounds BMI 43.4.  General: Well-developed well-nourished female no acute distress mood and affect appropriate. Right hip: Pain with internal/external rotation and slightly limited internal rotation.  She ambulates with a cane.  Impression: Right hip osteoarthritis Obesity with a BMI of 43.34  Plan: Discussed with [name] that she still needs to lose another 23 pounds before she will be under the BMI of 40.  Congratulated her on her weight loss and encouraged her to continue to work on weight loss and exercise.  Also continue to keep her glucose levels under good control that she has been doing.  Will see her back in 3 months for height and weight check.  Questions were encouraged and answered.

## 2021-09-18 DIAGNOSIS — Z20822 Contact with and (suspected) exposure to covid-19: Secondary | ICD-10-CM | POA: Diagnosis not present

## 2021-09-19 DIAGNOSIS — Z20822 Contact with and (suspected) exposure to covid-19: Secondary | ICD-10-CM | POA: Diagnosis not present

## 2021-09-22 DIAGNOSIS — Z20822 Contact with and (suspected) exposure to covid-19: Secondary | ICD-10-CM | POA: Diagnosis not present

## 2021-09-23 DIAGNOSIS — Z20822 Contact with and (suspected) exposure to covid-19: Secondary | ICD-10-CM | POA: Diagnosis not present

## 2021-09-26 DIAGNOSIS — Z20822 Contact with and (suspected) exposure to covid-19: Secondary | ICD-10-CM | POA: Diagnosis not present

## 2021-09-27 DIAGNOSIS — Z20822 Contact with and (suspected) exposure to covid-19: Secondary | ICD-10-CM | POA: Diagnosis not present

## 2021-09-30 DIAGNOSIS — Z20822 Contact with and (suspected) exposure to covid-19: Secondary | ICD-10-CM | POA: Diagnosis not present

## 2021-10-01 DIAGNOSIS — Z20822 Contact with and (suspected) exposure to covid-19: Secondary | ICD-10-CM | POA: Diagnosis not present

## 2021-10-04 DIAGNOSIS — Z20822 Contact with and (suspected) exposure to covid-19: Secondary | ICD-10-CM | POA: Diagnosis not present

## 2021-10-05 DIAGNOSIS — Z20822 Contact with and (suspected) exposure to covid-19: Secondary | ICD-10-CM | POA: Diagnosis not present

## 2021-10-24 ENCOUNTER — Ambulatory Visit: Payer: Medicare HMO | Admitting: Specialist

## 2021-11-05 DIAGNOSIS — J302 Other seasonal allergic rhinitis: Secondary | ICD-10-CM | POA: Diagnosis not present

## 2021-11-05 DIAGNOSIS — Z720 Tobacco use: Secondary | ICD-10-CM | POA: Diagnosis not present

## 2021-11-05 DIAGNOSIS — E1165 Type 2 diabetes mellitus with hyperglycemia: Secondary | ICD-10-CM | POA: Diagnosis not present

## 2021-11-05 DIAGNOSIS — I2510 Atherosclerotic heart disease of native coronary artery without angina pectoris: Secondary | ICD-10-CM | POA: Diagnosis not present

## 2021-11-05 DIAGNOSIS — Z794 Long term (current) use of insulin: Secondary | ICD-10-CM | POA: Diagnosis not present

## 2021-11-05 DIAGNOSIS — R300 Dysuria: Secondary | ICD-10-CM | POA: Diagnosis not present

## 2021-11-05 DIAGNOSIS — I10 Essential (primary) hypertension: Secondary | ICD-10-CM | POA: Diagnosis not present

## 2021-11-05 DIAGNOSIS — E782 Mixed hyperlipidemia: Secondary | ICD-10-CM | POA: Diagnosis not present

## 2021-11-05 DIAGNOSIS — E039 Hypothyroidism, unspecified: Secondary | ICD-10-CM | POA: Diagnosis not present

## 2021-11-19 ENCOUNTER — Ambulatory Visit (INDEPENDENT_AMBULATORY_CARE_PROVIDER_SITE_OTHER): Payer: Medicare HMO | Admitting: Internal Medicine

## 2021-11-19 ENCOUNTER — Encounter: Payer: Self-pay | Admitting: Internal Medicine

## 2021-11-19 VITALS — BP 134/70 | HR 68 | Ht 68.0 in | Wt 282.0 lb

## 2021-11-19 DIAGNOSIS — E039 Hypothyroidism, unspecified: Secondary | ICD-10-CM

## 2021-11-19 DIAGNOSIS — Z794 Long term (current) use of insulin: Secondary | ICD-10-CM | POA: Diagnosis not present

## 2021-11-19 DIAGNOSIS — E1142 Type 2 diabetes mellitus with diabetic polyneuropathy: Secondary | ICD-10-CM | POA: Diagnosis not present

## 2021-11-19 DIAGNOSIS — E1159 Type 2 diabetes mellitus with other circulatory complications: Secondary | ICD-10-CM | POA: Diagnosis not present

## 2021-11-19 LAB — POCT GLYCOSYLATED HEMOGLOBIN (HGB A1C): Hemoglobin A1C: 6.7 % — AB (ref 4.0–5.6)

## 2021-11-19 MED ORDER — OZEMPIC (2 MG/DOSE) 8 MG/3ML ~~LOC~~ SOPN: 2.0000 mg | PEN_INJECTOR | SUBCUTANEOUS | 3 refills | Status: DC

## 2021-11-19 MED ORDER — LEVOTHYROXINE SODIUM 150 MCG PO TABS: 150.0000 ug | ORAL_TABLET | Freq: Every day | ORAL | 3 refills | Status: DC

## 2021-11-19 MED ORDER — NOVOLOG FLEXPEN 100 UNIT/ML ~~LOC~~ SOPN: PEN_INJECTOR | SUBCUTANEOUS | 3 refills | Status: DC

## 2021-11-19 MED ORDER — LANTUS SOLOSTAR 100 UNIT/ML ~~LOC~~ SOPN
10.0000 [IU] | PEN_INJECTOR | Freq: Every day | SUBCUTANEOUS | 3 refills | Status: DC
Start: 2021-11-19 — End: 2022-05-22

## 2021-11-19 NOTE — Patient Instructions (Signed)
-   Continue Lantus 10 daily - Decrease NOvolog to 8 units with each meal  - Increase Ozempic 2 mg weekly  -Novolog correctional insulin: ADD extra units on insulin to your meal-time Novolog dose if your blood sugars are higher than 165. Use the scale below to help guide you:   Blood sugar before meal Number of units to inject  Less than 165 0 unit  166 -  200 1 units  201 -  235 2 units  236 -  270 3 units  271 -  305 4 units  306 -  340 5 units  341 -  375 6 units  376 -  410 7 units      HOW TO TREAT LOW BLOOD SUGARS (Blood sugar LESS THAN 70 MG/DL) Please follow the RULE OF 15 for the treatment of hypoglycemia treatment (when your (blood sugars are less than 70 mg/dL)   STEP 1: Take 15 grams of carbohydrates when your blood sugar is low, which includes:  3-4 GLUCOSE TABS  OR 3-4 OZ OF JUICE OR REGULAR SODA OR ONE TUBE OF GLUCOSE GEL    STEP 2: RECHECK blood sugar in 15 MINUTES STEP 3: If your blood sugar is still low at the 15 minute recheck --> then, go back to STEP 1 and treat AGAIN with another 15 grams of carbohydrates.

## 2021-11-19 NOTE — Progress Notes (Signed)
Name: [name]  MRN/ DOB: [id number], [birth year]   Age/ Sex: 61 y.o., female    PCP: Benito Mccreedy, MD   Reason for Endocrinology Evaluation: Type 2 Diabetes Mellitus     Date of Initial Endocrinology Visit: 03/16/2021    PATIENT IDENTIFIER: [name] is a 61 y.o. female with a past medical history of T2DM, HTN, dyslipidemia and CAD (status post PCI). The patient presented for initial endocrinology clinic visit on 03/16/2021 for consultative assistance with her diabetes management.    HPI: [name] was    Diagnosed with DM 2013 Prior Medications tried/Intolerance: Metformin- diarrhea  as well as hand and feet swelling. Ozempic - no  Currently checking blood sugars 4-5 x / day,  .  Hypoglycemia episodes : no                Hemoglobin A1c has ranged from 7.5% in 2022, peaking at 11.5% in 2013.  No prior hx of pancreatitis     On her initial visit to our clinic her A1c was 7.5%, we adjusted MDI regimen and started Jardiance.  Trulicity was continued  THYROID HISTORY: She has been diagnosed with hypothyroid years ago ~ 2013. No prior neck sx or radiation.   Denies FH of thyroid disease   SUBJECTIVE:   During the last visit (07/23/2021): A1c 6.9 %  Today (11/19/21): [name] is here for follow-up on diabetes management.she checks her blood sugars 1-3 times daily. Has not been using The patient has been cost prohibitive  . She denies any hypoglycemic episodes since the last clinic visit.   Pt continues with weight loss  She is not on Jardiance , due to recurrent yeast infection  Denies nausea, vomiting or diarrhea , has been having issues with supply  Dexcom is cost prohibitive   HOME ENDOCRINE REGIMEN: Lantus 10 units once daily  NovoLog 10 units TID QAC Ozempic 2 mg weekly ( Friday ) but she has been taking 1 mg  Jardiance 25 mg daily- not taking  Levothyroxine 150 mcg daily      Statin: Yes ACE-I/ARB: No Prior Diabetic Education:  yes   METER DOWNLOAD SUMMARY: unable to download 87- 202 mg/dL   DIABETIC COMPLICATIONS: Microvascular complications:  Neuropathy  Denies: retinopathy, CKD  Last eye exam: Completed 12/2020  Macrovascular complications:  CAD(status post PCI)  Denies: PVD, CVA   PAST HISTORY: Past Medical History:  Past Medical History:  Diagnosis Date   ALLERGIC RHINITIS    Allergy to IVP dye    Coronary artery disease    Diabetes mellitus, type 2 ([place])    History of tobacco abuse    Quit 2011   Hypertension    Hypertriglyceridemia    Hypothyroidism    NSTEMI (non-ST elevated myocardial infarction) ([place]) 09/30/2011   NO CAD by cath, ? coronary vasospasm   Obesity    Sleep apnea    Past Surgical History:  Past Surgical History:  Procedure Laterality Date   CORONARY STENT INTERVENTION N/A 08/19/2017   Procedure: CORONARY STENT INTERVENTION;  Surgeon: [name], MD;  Location: [facility];  Service: Cardiovascular;  Laterality: N/A;   LEFT HEART CATH AND CORONARY ANGIOGRAPHY N/A 08/19/2017   Procedure: LEFT HEART CATH AND CORONARY ANGIOGRAPHY;  Surgeon: [name], MD;  Location: [facility];  Service: Cardiovascular;  Laterality: N/A;   LEFT HEART CATHETERIZATION WITH CORONARY ANGIOGRAM N/A 09/30/2011   Procedure: LEFT HEART CATHETERIZATION WITH CORONARY ANGIOGRAM;  Surgeon: Burnell Blanks,  MD;  Location: [facility];  Service: Cardiovascular;  Laterality: N/A;   LUMBAR DISC SURGERY  11/27/2020   L4-5   TUBAL LIGATION     VESICOVAGINAL FISTULA CLOSURE W/ TAH      Social History:  reports that she quit smoking about 10 years ago. Her smoking use included cigarettes. She has a 28.80 pack-year smoking history. She has never used smokeless tobacco. She reports that she does not currently use alcohol. She reports that she does not use drugs. Family History:  Family History  Problem Relation Age of Onset   Heart disease Mother        has pacemaker   Diabetes  Mother    Hypertension Brother    Diabetes Brother    Hypertension Brother    Cancer Maternal Uncle    Diabetes Other        strong family history     HOME MEDICATIONS: Allergies as of 11/19/2021       Reactions   Contrast Media [iodinated Contrast Media] Swelling   Skin Peeling and break out   Codeine Itching   Other    NO PLASTIC TAPE - Blisters and skin removal        Medication List        Accurate as of November 19, 2021  8:05 AM. If you have any questions, ask your nurse or doctor.          STOP taking these medications    FreeStyle Libre 2 Sensor Misc Stopped by: Dorita Sciara, MD   gabapentin 300 MG capsule Commonly known as: NEURONTIN Stopped by: Dorita Sciara, MD   methocarbamol 500 MG tablet Commonly known as: ROBAXIN Stopped by: Dorita Sciara, MD       TAKE these medications    amLODipine-valsartan 5-160 MG tablet Commonly known as: EXFORGE Take 1 tablet by mouth daily.   aspirin EC 81 MG tablet Take 81 mg by mouth daily.   B-D UF III MINI PEN NEEDLES 31G X 5 MM Misc Generic drug: Insulin Pen Needle 1 Device by Does not apply route in the morning, at noon, in the evening, and at bedtime.   ciprofloxacin 500 MG tablet Commonly known as: CIPRO Take 500 mg by mouth 2 (two) times daily.   empagliflozin 25 MG Tabs tablet Commonly known as: Jardiance Take 1 tablet (25 mg total) by mouth daily before breakfast.   Fish Oil 500 MG Caps Take 1 capsule by mouth daily.   isosorbide dinitrate 30 MG tablet Commonly known as: ISORDIL Take 30 mg by mouth 2 (two) times daily.   Lantus SoloStar 100 UNIT/ML Solostar Pen Generic drug: insulin glargine Inject 15 Units into the skin at bedtime. What changed: how much to take   levothyroxine 150 MCG tablet Commonly known as: SYNTHROID Take 1 tablet (150 mcg total) by mouth daily before breakfast.   metoprolol succinate 50 MG 24 hr tablet Commonly known as: TOPROL-XL Take  50 mg by mouth daily.   Mitigare 0.6 MG Caps Generic drug: Colchicine Take 0.6 mg by mouth every other day.   nitroGLYCERIN 0.4 MG SL tablet Commonly known as: NITROSTAT Place 1 tablet (0.4 mg total) under the tongue every 5 (five) minutes as needed for chest pain (up to 3 doses).   NovoLOG FlexPen 100 UNIT/ML FlexPen Generic drug: insulin aspart Max Daily 60 units What changed: how much to take   Ozempic (2 MG/DOSE) 8 MG/3ML Sopn Generic drug: Semaglutide (2 MG/DOSE) Inject 1 mg  into the skin once a week.   rosuvastatin 40 MG tablet Commonly known as: CRESTOR Take 40 mg by mouth daily.   traZODone 50 MG tablet Commonly known as: DESYREL Take 50 mg by mouth at bedtime as needed for sleep.   vitamin D3 50 MCG (2000 UT) Caps Take 1 each by mouth daily.         ALLERGIES: Allergies  Allergen Reactions   Contrast Media [Iodinated Contrast Media] Swelling    Skin Peeling and break out   Codeine Itching   Other     NO PLASTIC TAPE - Blisters and skin removal     REVIEW OF SYSTEMS: A comprehensive ROS was conducted with the patient and is negative except as per HPI and    OBJECTIVE:   VITAL SIGNS: BP 134/70 (BP Location: Left Arm, Patient Position: Sitting, Cuff Size: Large)   Pulse 68   Ht '5\' 8"'$  (1.727 m)   Wt 282 lb (127.9 kg)   SpO2 99%   BMI 42.88 kg/m    PHYSICAL EXAM:  General: Pt appears well and is in NAD  Neck: General: Supple without adenopathy or carotid bruits. Thyroid: Thyroid size normal.  No goiter or nodules appreciated. No thyroid bruit.  Lungs: Clear with good BS bilat with no rales, rhonchi, or wheezes  Heart: RRR   Extremities:  Lower extremities - No pretibial edema. No lesions.  Neuro: MS is good with appropriate affect, pt is alert and Ox3   DM Foot Exam 07/23/2021   The skin of the feet is intact without sores or ulcerations. The pedal pulses are 2+ on right and 2+ on left. The sensation is intact to a screening 5.07, 10 gram  monofilament bilaterally   DATA REVIEWED:  Lab Results  Component Value Date   HGBA1C 6.9 (A) 07/23/2021   HGBA1C 7.5 (A) 03/16/2021   HGBA1C 7.5 (H) 11/27/2020     Latest Reference Range & Units 07/23/21 09:04  Sodium 135 - 145 mEq/L 139  Potassium 3.5 - 5.1 mEq/L 3.8  Chloride 96 - 112 mEq/L 108  CO2 19 - 32 mEq/L 21  Glucose 70 - 99 mg/dL 137 (H)  BUN 6 - 23 mg/dL 19  Creatinine 0.40 - 1.20 mg/dL 1.19  Calcium 8.4 - 10.5 mg/dL 9.3  GFR >60.00 mL/min 49.48 (L)    Latest Reference Range & Units 07/23/21 09:04  TSH 0.35 - 5.50 uIU/mL 0.90        Latest Reference Range & Units 03/16/21 09:34  Creatinine,U mg/dL 107.8  Microalb, Ur 0.0 - 1.9 mg/dL 1.0  MICROALB/CREAT RATIO 0.0 - 30.0 mg/g 0.9     ASSESSMENT / PLAN / RECOMMENDATIONS:   1) Type 2 Diabetes Mellitus, Optimally controlled, With Neuropathic and  macrovascular complications - Most recent A1c of 6.7%. Goal A1c <7.0%.    - Praised the pt on optimizing glucose control and weight loss  - Intolerant to [place] due to recurrent genital infections  - Dexcom cost prohibitive  -I will increase her Ozempic as below, and decrease prandial insulin as below   MEDICATIONS: Continue Lantus 10 units daily Decrease NovoLog to 8 units 3 times daily with each meal Increase Ozempic 2 mg mg weekly Continue correction factor: NovoLog (BG -130/35)   EDUCATION / INSTRUCTIONS: BG monitoring instructions: Patient is instructed to check her blood sugars 3 times a day, fasting. Call [facility] if: BG persistently < 70  I reviewed the Rule of 15 for the treatment of hypoglycemia in  detail with the patient. Literature supplied.   2) Diabetic complications:  Eye: Does not have known diabetic retinopathy.  Neuro/ Feet: Does  have known diabetic peripheral neuropathy. Renal: Patient does not have known baseline CKD, but her GFR fluctuates. She is not on an ACEI/ARB at present.  3) Hypothyroidism:  -TSH has  been normal in the past on current dose - No change   Medication Continue levothyroxine 150 mcg daily   F/U in 6 months     Signed electronically by: Mack Guise, MD  [facility] End[facility]  [facility] [address] [place], [place] [address] Phone: [phone] FAX: [phone]   CC: Benito Mccreedy, [place] [address] [place] [address] Phone: [phone]  Fax: [phone]    Return to Endocrinology clinic as below: Future Appointments  Date Time Provider [place]  11/19/2021  8:10 AM Dareth Andrew, Melanie Crazier, MD [facility] None  12/17/2021  8:15 AM Pete Pelt, PA-C [facility] None  01/02/2022 10:30 AM Rex Kras, DO [facility] None

## 2021-12-17 ENCOUNTER — Ambulatory Visit: Payer: Medicare HMO | Admitting: Physician Assistant

## 2021-12-24 ENCOUNTER — Other Ambulatory Visit: Payer: Self-pay | Admitting: Physician Assistant

## 2021-12-24 DIAGNOSIS — Z1231 Encounter for screening mammogram for malignant neoplasm of breast: Secondary | ICD-10-CM

## 2022-01-02 ENCOUNTER — Ambulatory Visit: Payer: Medicare HMO | Admitting: Cardiology

## 2022-01-10 DIAGNOSIS — E119 Type 2 diabetes mellitus without complications: Secondary | ICD-10-CM | POA: Diagnosis not present

## 2022-01-14 ENCOUNTER — Ambulatory Visit: Payer: Medicare HMO | Admitting: Cardiology

## 2022-01-14 ENCOUNTER — Ambulatory Visit (INDEPENDENT_AMBULATORY_CARE_PROVIDER_SITE_OTHER): Payer: Medicare HMO | Admitting: Physician Assistant

## 2022-01-14 ENCOUNTER — Encounter: Payer: Self-pay | Admitting: Physician Assistant

## 2022-01-14 VITALS — Ht 68.0 in | Wt 271.0 lb

## 2022-01-14 DIAGNOSIS — M1611 Unilateral primary osteoarthritis, right hip: Secondary | ICD-10-CM | POA: Diagnosis not present

## 2022-01-14 NOTE — Progress Notes (Signed)
HPI: [name] returns today for height and weight check.  She states that she has been losing weight.  Notes that her blood pressure and diabetes are under good control.  She reports she is going to have an EKG performed today and is for clearance.  She is using a cane.  States the pain in the right hip is getting worse.  She has had no new injury to the hip.   Review of systems: See HPI otherwise negative  Physical exam: Height 5 foot 8 inches weight 271 pounds BMI 41.22  Right hip: Limited internal and external rotation secondary to pain.  Patient ambulates with a cane and is able to get on and off the exam table on her.  Is able to lie down on the table today and although her thigh and small she does have slight pannus that overhangs the thigh area where the incision of the knee pain is mobile and remains itself to anterior hip surgical approach.  Impression: Right hip osteoarthritis Obesity with a BMI of 41.22  Plan: Discussed with her and still needs to lose more weight but congratulated her on weight loss.  She would like to see [place] back in 1 month to see what her weight is at that time.  Questions were encouraged and answered at length.

## 2022-01-16 DIAGNOSIS — H52209 Unspecified astigmatism, unspecified eye: Secondary | ICD-10-CM | POA: Diagnosis not present

## 2022-01-16 DIAGNOSIS — H524 Presbyopia: Secondary | ICD-10-CM | POA: Diagnosis not present

## 2022-01-16 DIAGNOSIS — H5213 Myopia, bilateral: Secondary | ICD-10-CM | POA: Diagnosis not present

## 2022-02-07 DIAGNOSIS — I2510 Atherosclerotic heart disease of native coronary artery without angina pectoris: Secondary | ICD-10-CM | POA: Diagnosis not present

## 2022-02-07 DIAGNOSIS — E039 Hypothyroidism, unspecified: Secondary | ICD-10-CM | POA: Diagnosis not present

## 2022-02-07 DIAGNOSIS — Z794 Long term (current) use of insulin: Secondary | ICD-10-CM | POA: Diagnosis not present

## 2022-02-07 DIAGNOSIS — E782 Mixed hyperlipidemia: Secondary | ICD-10-CM | POA: Diagnosis not present

## 2022-02-07 DIAGNOSIS — E1165 Type 2 diabetes mellitus with hyperglycemia: Secondary | ICD-10-CM | POA: Diagnosis not present

## 2022-02-07 DIAGNOSIS — Z720 Tobacco use: Secondary | ICD-10-CM | POA: Diagnosis not present

## 2022-02-07 DIAGNOSIS — I10 Essential (primary) hypertension: Secondary | ICD-10-CM | POA: Diagnosis not present

## 2022-02-07 DIAGNOSIS — J302 Other seasonal allergic rhinitis: Secondary | ICD-10-CM | POA: Diagnosis not present

## 2022-02-07 DIAGNOSIS — Z6841 Body Mass Index (BMI) 40.0 and over, adult: Secondary | ICD-10-CM | POA: Diagnosis not present

## 2022-02-15 ENCOUNTER — Ambulatory Visit
Admission: RE | Admit: 2022-02-15 | Discharge: 2022-02-15 | Disposition: A | Discharge status: Home / Self Care | Payer: Medicare HMO | Source: Ambulatory Visit | Attending: Physician Assistant | Admitting: Physician Assistant

## 2022-02-15 DIAGNOSIS — Z1231 Encounter for screening mammogram for malignant neoplasm of breast: Secondary | ICD-10-CM

## 2022-02-18 ENCOUNTER — Ambulatory Visit: Payer: Medicare HMO | Admitting: Physician Assistant

## 2022-02-19 ENCOUNTER — Other Ambulatory Visit: Payer: Self-pay | Admitting: Physician Assistant

## 2022-02-19 DIAGNOSIS — R928 Other abnormal and inconclusive findings on diagnostic imaging of breast: Secondary | ICD-10-CM

## 2022-02-28 ENCOUNTER — Ambulatory Visit
Admission: RE | Admit: 2022-02-28 | Discharge: 2022-02-28 | Disposition: A | Discharge status: Home / Self Care | Payer: Medicare HMO | Source: Ambulatory Visit | Attending: Physician Assistant | Admitting: Physician Assistant

## 2022-02-28 DIAGNOSIS — R928 Other abnormal and inconclusive findings on diagnostic imaging of breast: Secondary | ICD-10-CM

## 2022-02-28 DIAGNOSIS — N6452 Nipple discharge: Secondary | ICD-10-CM | POA: Diagnosis not present

## 2022-03-01 ENCOUNTER — Other Ambulatory Visit: Payer: Self-pay | Admitting: Physician Assistant

## 2022-03-01 DIAGNOSIS — N6320 Unspecified lump in the left breast, unspecified quadrant: Secondary | ICD-10-CM

## 2022-03-05 ENCOUNTER — Ambulatory Visit
Admission: RE | Admit: 2022-03-05 | Discharge: 2022-03-05 | Disposition: A | Discharge status: Home / Self Care | Payer: Medicare HMO | Source: Ambulatory Visit | Attending: Physician Assistant | Admitting: Physician Assistant

## 2022-03-05 DIAGNOSIS — N6042 Mammary duct ectasia of left breast: Secondary | ICD-10-CM | POA: Diagnosis not present

## 2022-03-05 DIAGNOSIS — N6320 Unspecified lump in the left breast, unspecified quadrant: Secondary | ICD-10-CM

## 2022-03-05 DIAGNOSIS — D242 Benign neoplasm of left breast: Secondary | ICD-10-CM | POA: Diagnosis not present

## 2022-03-05 DIAGNOSIS — N6321 Unspecified lump in the left breast, upper outer quadrant: Secondary | ICD-10-CM | POA: Diagnosis not present

## 2022-03-05 HISTORY — PX: BREAST BIOPSY: SHX20

## 2022-03-11 ENCOUNTER — Ambulatory Visit (INDEPENDENT_AMBULATORY_CARE_PROVIDER_SITE_OTHER): Payer: Medicare HMO | Admitting: Physician Assistant

## 2022-03-11 ENCOUNTER — Encounter: Payer: Self-pay | Admitting: Physician Assistant

## 2022-03-11 VITALS — Ht 68.0 in | Wt 264.8 lb

## 2022-03-11 DIAGNOSIS — M1611 Unilateral primary osteoarthritis, right hip: Secondary | ICD-10-CM | POA: Diagnosis not present

## 2022-03-11 DIAGNOSIS — Z6841 Body Mass Index (BMI) 40.0 and over, adult: Secondary | ICD-10-CM

## 2022-03-11 NOTE — Progress Notes (Signed)
HPI: [name] comes in today for weight check.  Again she has known right hip osteoarthritis.  She has been working on weight loss in order to undergo a right total hip arthroplasty.  She has had no new injuries.  No health status changes.  Physical exam: Height 5 foot 8 weight 264.8 pounds calculated BMI 40.3  General well-developed well-nourished female no acute distress.  Ambulates with a cane. Right hip pain any attempts at range of motion causes significant pain.  Right calf supple nontender.  Left shoulder pain with overhead activity passively I am able to bring her to approximately 170 degrees.   Impression: Right hip osteoarthritis Obesity and BMI 40.3 Left shoulder pain.  Plan: She will continue to work on weight loss.  Will see her back in approximately 2 weeks that she feels that she can lose the additional weight she needs to lose to undergo surgery.  In regards to her left shoulder she is shown shoulder exercises.  Will reevaluate her shoulder at that time.  Questions were encouraged

## 2022-03-21 ENCOUNTER — Ambulatory Visit: Payer: Self-pay | Admitting: General Surgery

## 2022-03-21 DIAGNOSIS — D242 Benign neoplasm of left breast: Secondary | ICD-10-CM | POA: Diagnosis not present

## 2022-03-26 ENCOUNTER — Telehealth: Payer: Self-pay | Admitting: Orthopaedic Surgery

## 2022-03-26 ENCOUNTER — Ambulatory Visit: Payer: Medicare HMO | Admitting: Cardiology

## 2022-03-26 ENCOUNTER — Encounter: Payer: Self-pay | Admitting: Cardiology

## 2022-03-26 VITALS — BP 136/72 | HR 76 | Ht 68.0 in | Wt 262.0 lb

## 2022-03-26 DIAGNOSIS — I10 Essential (primary) hypertension: Secondary | ICD-10-CM

## 2022-03-26 DIAGNOSIS — Z794 Long term (current) use of insulin: Secondary | ICD-10-CM

## 2022-03-26 DIAGNOSIS — Z01810 Encounter for preprocedural cardiovascular examination: Secondary | ICD-10-CM | POA: Diagnosis not present

## 2022-03-26 DIAGNOSIS — I2510 Atherosclerotic heart disease of native coronary artery without angina pectoris: Secondary | ICD-10-CM

## 2022-03-26 DIAGNOSIS — E782 Mixed hyperlipidemia: Secondary | ICD-10-CM

## 2022-03-26 DIAGNOSIS — Z6839 Body mass index (BMI) 39.0-39.9, adult: Secondary | ICD-10-CM | POA: Diagnosis not present

## 2022-03-26 DIAGNOSIS — Z955 Presence of coronary angioplasty implant and graft: Secondary | ICD-10-CM | POA: Diagnosis not present

## 2022-03-26 DIAGNOSIS — E1159 Type 2 diabetes mellitus with other circulatory complications: Secondary | ICD-10-CM | POA: Diagnosis not present

## 2022-03-26 NOTE — Progress Notes (Signed)
Date:  03/26/2022   ID:  ROMEY SOBIERAJ, DOB 1960/12/08, MRN [id number]  PCP:  Benito Mccreedy, MD  Cardiologist:  Rex Kras, DO, [facility] (established care 06/01/2020) Former Cardiology Providers: Dr. Angelena Form, Dr. Acie Fredrickson, Dr. Einar Gip, Dr. [name], Dr. Layne Benton  Date: 03/26/22 Last Office Visit: 07/05/2021  Chief Complaint  Patient presents with   Atherosclerosis of native coronary artery of native heart w   Preoperative cardiovascular examination    HPI  [name] is a 62 y.o. female whose past medical history and cardiovascular risk factors include: Hypertension, hyperlipidemia, insulin dependent diabetes mellitus type 2, family history of premature CAD (mom had PCI's at the age of 22), established CAD status post PCI to the RCA, former smoker, OSA on CPAP, obesity due to excess calories.  Patient presents today for a 69-monthfollow-up visit given her underlying CAD and preoperative risk stratification.  Patient has known history of CAD status post PCI to the RCA and family history of premature CAD with multiple cardiovascular risk factors.  She has undergone ischemic workup as outlined above and have been focusing on improving her modifiable cardiovascular risk factors thereafter.  Since last office visit she is lost approximately 34 pounds due to lifestyle changes, Ozempic, and walking at least 60 minutes/day.  She is being considered for left breast lumpectomy w/ Dr. PAutumn MessingIII according to her as well as right hip replacement with Dr. CCarlis Abbott  She denies anginal discomfort, no heart failure symptoms, no history of significant valvular heart disease, no history of CAD with history of percutaneous intervention, insulin-dependent diabetes, and serum creatinine level based on last labs were less than 2 mg/dL.   Patient is accompanied by her daughter and grandchild at today's office visit.  FUNCTIONAL STATUS: Has started working out on a treadmill 4 times a day 10 minutes  each.  ALLERGIES: Allergies  Allergen Reactions   Contrast Media [Iodinated Contrast Media] Swelling    Skin Peeling and break out   Codeine Itching   Other     NO PLASTIC TAPE - Blisters and skin removal    MEDICATION LIST PRIOR TO VISIT: Current Meds  Medication Sig   amLODipine-valsartan (EXFORGE) 5-160 MG tablet Take 1 tablet by mouth daily.   aspirin EC 81 MG tablet Take 81 mg by mouth daily.   Cholecalciferol (VITAMIN D3) 50 MCG (2000 UT) CAPS Take 1 each by mouth daily.   Insulin Pen Needle (B-D UF III MINI PEN NEEDLES) 31G X 5 MM MISC 1 Device by Does not apply route in the morning, at noon, in the evening, and at bedtime.   isosorbide dinitrate (ISORDIL) 30 MG tablet Take 30 mg by mouth 2 (two) times daily.   LANTUS SOLOSTAR 100 UNIT/ML Solostar Pen Inject 10 Units into the skin at bedtime.   levothyroxine (SYNTHROID) 150 MCG tablet Take 1 tablet (150 mcg total) by mouth daily before breakfast.   metoprolol succinate (TOPROL-XL) 50 MG 24 hr tablet Take 50 mg by mouth daily.   MITIGARE 0.6 MG CAPS Take 0.6 mg by mouth every other day.   nitroGLYCERIN (NITROSTAT) 0.4 MG SL tablet Place 1 tablet (0.4 mg total) under the tongue every 5 (five) minutes as needed for chest pain (up to 3 doses).   NOVOLOG FLEXPEN 100 UNIT/ML FlexPen Max Daily 30 units   OZEMPIC, 2 MG/DOSE, 8 MG/3ML SOPN Inject 2 mg into the skin once a week.   rosuvastatin (CRESTOR) 40 MG tablet Take 40 mg by mouth daily.  traZODone (DESYREL) 50 MG tablet Take 50 mg by mouth at bedtime as needed for sleep.     PAST MEDICAL HISTORY: Past Medical History:  Diagnosis Date   ALLERGIC RHINITIS    Allergy to IVP dye    Coronary artery disease    Diabetes mellitus, type 2 ([place])    History of tobacco abuse    Quit 2011   Hypertension    Hypertriglyceridemia    Hypothyroidism    NSTEMI (non-ST elevated myocardial infarction) ([place]) 09/30/2011   NO CAD by cath, ? coronary vasospasm   Obesity    Sleep apnea      PAST SURGICAL HISTORY: Past Surgical History:  Procedure Laterality Date   BREAST BIOPSY Left 03/05/2022   [place] LT BREAST BX W LOC DEV 1ST LESION IMG BX [place] US GUIDE 03/05/2022 [facility] MAMMOGRAPHY   CORONARY STENT INTERVENTION N/A 08/19/2017   Procedure: CORONARY STENT INTERVENTION;  Surgeon: [name], MD;  Location: [place] [facility];  Service: Cardiovascular;  Laterality: N/A;   LEFT HEART CATH AND CORONARY ANGIOGRAPHY N/A 08/19/2017   Procedure: LEFT HEART CATH AND CORONARY ANGIOGRAPHY;  Surgeon: [name], MD;  Location: [place] [facility];  Service: Cardiovascular;  Laterality: N/A;   LEFT HEART CATHETERIZATION WITH CORONARY ANGIOGRAM N/A 09/30/2011   Procedure: LEFT HEART CATHETERIZATION WITH CORONARY ANGIOGRAM;  Surgeon: Burnell Blanks, MD;  Location[facility];  Service: Cardiovascular;  Laterality: N/A;   LUMBAR DISC SURGERY  11/27/2020   L4-5   TUBAL LIGATION     VESICOVAGINAL FISTULA CLOSURE W/ TAH      FAMILY HISTORY: The patient family history includes Cancer in her maternal uncle; Diabetes in her brother, mother, and another family member; Heart disease in her mother; Hypertension in her brother and brother.  SOCIAL HISTORY:  The patient  reports that she quit smoking about 11 years ago. Her smoking use included cigarettes. She has a 28.80 pack-year smoking history. She has never used smokeless tobacco. She reports that she does not currently use alcohol. She reports that she does not use drugs.  REVIEW OF SYSTEMS: Review of Systems  Constitutional: Positive for weight loss.  Cardiovascular:  Negative for chest pain, dyspnea on exertion, leg swelling, near-syncope, orthopnea, palpitations, paroxysmal nocturnal dyspnea and syncope.  Respiratory:  Negative for shortness of breath.   Musculoskeletal:  Positive for joint pain.    PHYSICAL EXAM:    03/26/2022   11:42 AM 03/26/2022   11:37 AM 03/11/2022    8:20 AM  Vitals with BMI  Height  5' 8"$  5' 8"$    Weight  262 lbs 264 lbs 13 oz  BMI  0000000 [id number]  Systolic [id number]   Diastolic 72 74   Pulse 76 77     Physical Exam  Constitutional: No distress.  Age appropriate, hemodynamically stable, ambulates w/ cane  Neck: No JVD present.  Cardiovascular: Normal rate, regular rhythm, S1 normal, S2 normal, intact distal pulses and normal pulses. Exam reveals no gallop, no S3 and no S4.  No murmur heard. Pulmonary/Chest: Effort normal and breath sounds normal. No stridor. She has no wheezes. She has no rales.  Abdominal: Soft. Bowel sounds are normal. She exhibits no distension. There is no abdominal tenderness.  Musculoskeletal:        General: No edema.     Cervical back: Neck supple.  Neurological: She is alert and oriented to person, place, and time. She has intact cranial nerves (2-12).  Skin: Skin is warm and moist.  I think my last CARDIAC DATABASE: EKG: 03/26/2022: Sinus rhythm, 68 bpm, without underlying injury pattern.  Echocardiogram: 06/08/2020: Technically difficult study. Left ventricle cavity is normal in size and wall thickness. Normal global wall motion. Normal LV systolic function with visual EF 50-55%. Doppler evidence of grade I (impaired) diastolic dysfunction, normal LAP. No significant valvular abnormality. Normal right atrial pressure. No significant change compared to previous study in 2019.   Stress Testing: Lexiscan Tetrofosmin stress test 06/07/2020: 1 Day Rest/Stress Protocol. Stress EKG is non-diagnostic, as this is pharmacological stress test using Lexiscan. No convincing evidence of reversible myocardial ischemia or prior infarct. Left ventricular wall thickness is preserved without regional wall motion abnormalities. Calculated LVEF  51%, visually appears preserved. No prior studies available for comparison.  Heart Catheterization: Coronary angiogram 08/19/2017: Normal LV systolic function, EF [id number], normal LVEDP. Left main nonexistent, separate ostia  for LAD and circumflex.  LAD has mild diffuse disease.  Circumflex is codominant with RCA and is more than normal. RCA is codominant with circumflex coronary artery.  Mid segment shows a ulcerated 80% stenosis S/P 3.0 x 20 mm Synergy, 80% to 0%, TIMI-3 to TIMI-3 flow maintained.   Recommend uninterrupted dual antiplatelet therapy with Aspirin 63m daily and Ticagrelor 954mtwice daily for a minimum of 12 months (ACS - Class I recommendation).     LABORATORY DATA:    Latest Ref Rng & Units 11/28/2020    5:00 AM 11/17/2020   10:53 AM 03/16/2018   10:43 AM  CBC  WBC 4.0 - 10.5 K/uL 14.4  9.9  10.4   Hemoglobin 12.0 - 15.0 g/dL 10.2  13.1  13.2   Hematocrit 36.0 - 46.0 % 32.3  40.8  40.2   Platelets 150 - 400 K/uL 286  303  337.0        Latest Ref Rng & Units 07/23/2021    9:04 AM 03/16/2021    9:34 AM 11/28/2020    5:00 AM  CMP  Glucose 70 - 99 mg/dL 137  141  180   BUN 6 - 23 mg/dL 19  21  18   $ Creatinine 0.40 - 1.20 mg/dL 1.19  1.00  1.24   Sodium 135 - 145 mEq/L 139  140  138   Potassium 3.5 - 5.1 mEq/L 3.8  4.5  4.0   Chloride 96 - 112 mEq/L 108  106  106   CO2 19 - 32 mEq/L 21  26  20   $ Calcium 8.4 - 10.5 mg/dL 9.3  9.3  8.6   Total Protein 6.5 - 8.1 g/dL   6.5   Total Bilirubin 0.3 - 1.2 mg/dL   1.2   Alkaline Phos 38 - 126 U/L   50   AST 15 - 41 U/L   21   ALT 0 - 44 U/L   26     Lipid Panel     Component Value Date/Time   CHOL 92 03/16/2021 0934   TRIG 105.0 03/16/2021 0934   HDL 49.80 03/16/2021 0934   CHOLHDL 2 03/16/2021 0934   VLDL 21.0 03/16/2021 0934   LDLCALC 22 03/16/2021 0934    No components found for: "NTPROBNP" No results for input(s): "PROBNP" in the last 8760 hours. Recent Labs    07/23/21 0904  TSH 0.90    BMP Recent Labs    07/23/21 0904  NA 139  K 3.8  CL 108  CO2 21  GLUCOSE 137*  BUN 19  CREATININE 1.19  CALCIUM 9.3  HEMOGLOBIN A1C Lab Results  Component Value Date   HGBA1C 6.7 (A) 11/19/2021   MPG 168.55 11/27/2020    External Labs: Collected: 04/21/2020 Creatinine 1.16 mg/dL. Sodium 139 potassium 4, chloride 105, bicarb 24 eGFR: 60 mL/min per 1.73 m Lipid profile: Total cholesterol 100, triglycerides 224 HDL 44, LDL 28, non-HDL 56 A1c 8.3 TSH: 0.18, free T4 1.4    IMPRESSION:    ICD-10-CM   1. Preoperative cardiovascular examination  Z01.810     2. Atherosclerosis of native coronary artery of native heart without angina pectoris  I25.10 EKG 12-Lead    3. History of coronary angioplasty with insertion of stent  Z95.5     4. Benign hypertension  I10     5. Type 2 diabetes mellitus with other circulatory complication, with long-term current use of insulin (HCC)  E11.59    Z79.4     6. Mixed hyperlipidemia  E78.2     7. Class 2 severe obesity due to excess calories with serious comorbidity and body mass index (BMI) of 39.0 to 39.9 in adult [facility])  E66.01    Z68.39        RECOMMENDATIONS: [name] is a 62 y.o. female whose past medical history and cardiac risk factors include: Hypertension, hyperlipidemia, insulin dependent diabetes mellitus type 2, family history of premature CAD (mom had PCI's at the age of 63), established CAD status post PCI to the RCA, former smoker, OSA on CPAP, obesity due to excess calories.  Preoperative cardiovascular examination Planning to undergo left breast lumpectomy as well as right hip replacement. No anginal discomfort or heart failure symptoms. EKG nonischemic. Most recent echo and stress test results reviewed.  Stress test was low risk. With lifestyle changes and pharmacological therapy and increasing physical activity she has managed to lose 34 pounds. She walks at least 60 minutes/day. Her past history is significant for CAD with prior coronary interventions, and insulin-dependent diabetes. Patient is acceptable risk for upcoming noncardiac surgery.  She finds her risk stratification acceptable.  No additional testing warranted at this  time.  Atherosclerosis of native coronary artery of native heart without angina pectoris History of coronary angioplasty with insertion of stent Denies angina pectoris. No use of sublingual nitroglycerin tablets since the last office visit. Medications reconciled. Reemphasized the importance of improving her modifiable cardiovascular risk factors. Last ischemic workup in May 2022. Monitor for now.  Benign hypertension Office blood pressures are well-controlled. No changes warranted at this time.  Type 2 diabetes mellitus with other circulatory complication, with long-term current use of insulin ([place]) Reemphasized importance of glycemic control. Currently on insulin, Ozempic, ARB, statin therapy.  FINAL MEDICATION LIST END OF ENCOUNTER: No orders of the defined types were placed in this encounter.   Medications Discontinued During This Encounter  Medication Reason   empagliflozin (JARDIANCE) 25 MG TABS tablet Patient Preference   ciprofloxacin (CIPRO) 500 MG tablet Patient Preference   Omega-3 Fatty Acids (FISH OIL) 500 MG CAPS Patient Preference     Current Outpatient Medications:    amLODipine-valsartan (EXFORGE) 5-160 MG tablet, Take 1 tablet by mouth daily., Disp: , Rfl:    aspirin EC 81 MG tablet, Take 81 mg by mouth daily., Disp: , Rfl:    Cholecalciferol (VITAMIN D3) 50 MCG (2000 UT) CAPS, Take 1 each by mouth daily., Disp: , Rfl:    Insulin Pen Needle (B-D UF III MINI PEN NEEDLES) 31G X 5 MM MISC, 1 Device by Does not apply route in the morning, at  noon, in the evening, and at bedtime., Disp: 400 each, Rfl: 3   isosorbide dinitrate (ISORDIL) 30 MG tablet, Take 30 mg by mouth 2 (two) times daily., Disp: , Rfl:    LANTUS SOLOSTAR 100 UNIT/ML Solostar Pen, Inject 10 Units into the skin at bedtime., Disp: 15 mL, Rfl: 3   levothyroxine (SYNTHROID) 150 MCG tablet, Take 1 tablet (150 mcg total) by mouth daily before breakfast., Disp: 90 tablet, Rfl: 3   metoprolol succinate  (TOPROL-XL) 50 MG 24 hr tablet, Take 50 mg by mouth daily., Disp: , Rfl:    MITIGARE 0.6 MG CAPS, Take 0.6 mg by mouth every other day., Disp: , Rfl:    nitroGLYCERIN (NITROSTAT) 0.4 MG SL tablet, Place 1 tablet (0.4 mg total) under the tongue every 5 (five) minutes as needed for chest pain (up to 3 doses)., Disp: 25 tablet, Rfl: 3   NOVOLOG FLEXPEN 100 UNIT/ML FlexPen, Max Daily 30 units, Disp: 30 mL, Rfl: 3   OZEMPIC, 2 MG/DOSE, 8 MG/3ML SOPN, Inject 2 mg into the skin once a week., Disp: 9 mL, Rfl: 3   rosuvastatin (CRESTOR) 40 MG tablet, Take 40 mg by mouth daily., Disp: , Rfl:    traZODone (DESYREL) 50 MG tablet, Take 50 mg by mouth at bedtime as needed for sleep., Disp: , Rfl:   Orders Placed This Encounter  Procedures   EKG 12-Lead     There are no Patient Instructions on file for this visit.   --Continue cardiac medications as reconciled in final medication list. --Return in about 6 months (around 09/24/2022) for Follow up, CAD,post-op. Or sooner if needed. --Continue follow-up with your primary care physician regarding the management of your other chronic comorbid conditions.  Patient's questions and concerns were addressed to her satisfaction. She voices understanding of the instructions provided during this encounter.   This note was created using a voice recognition software as a result there may be grammatical errors inadvertently enclosed that do not reflect the nature of this encounter. Every attempt is made to correct such errors.  Rex Kras, [place], [facility]  Pager: [phone] Office: [phone]

## 2022-04-01 ENCOUNTER — Encounter: Payer: Self-pay | Admitting: Physician Assistant

## 2022-04-01 ENCOUNTER — Ambulatory Visit (INDEPENDENT_AMBULATORY_CARE_PROVIDER_SITE_OTHER): Payer: Medicare HMO | Admitting: Physician Assistant

## 2022-04-01 DIAGNOSIS — M1611 Unilateral primary osteoarthritis, right hip: Secondary | ICD-10-CM | POA: Diagnosis not present

## 2022-04-01 NOTE — Progress Notes (Signed)
HPI: Mrs. Bostian returns today for height and weight check.  She states her right hip is really bothering her.  She has been working on weight loss.  She states her glucose is under good control and that her last hemoglobin A1c was 6.7.  She denies any fevers chills shortness of breath chest pain.  She also denies left shoulder pain which was dictated last time that is being problematic.  Her weight loss has been a journey for starting at 331 pounds.  She is finding sedentary exercises she can do to try to lose weight and also has been dieting.  Review of systems: See HPI otherwise negative  Physical exam: Height 5 foot 8 inches weight 262 pounds BMI 39.85 General well-developed well-nourished female no acute distress mood and affect appropriate.  Right hip: Pain with internal/external rotation. Fluid motion.  Dorsiflexion plantarflexion bilateral ankles intact.  Ambulates with an antalgic gait and uses a cane.   Impression: Right hip osteoarthritis  Plan: Discussed with her right total hip arthroplasty handout was given.  Risk benefits of surgery discussed.  Risk include but not limited to acute leg length discrepancy, nerve vessel injury, blood loss, wound healing problems, infection, dislocation and DVT/PE.  Will work on getting her set up for right total hip arthroplasty in the near future.  Questions were encouraged and answered at length.  Encouraged her to continue to work on weight loss and keeping her glucose levels under good control.

## 2022-04-04 ENCOUNTER — Other Ambulatory Visit: Payer: Self-pay | Admitting: General Surgery

## 2022-04-04 DIAGNOSIS — D242 Benign neoplasm of left breast: Secondary | ICD-10-CM

## 2022-04-08 DIAGNOSIS — Z0000 Encounter for general adult medical examination without abnormal findings: Secondary | ICD-10-CM | POA: Diagnosis not present

## 2022-04-08 DIAGNOSIS — Z794 Long term (current) use of insulin: Secondary | ICD-10-CM | POA: Diagnosis not present

## 2022-04-08 DIAGNOSIS — Z720 Tobacco use: Secondary | ICD-10-CM | POA: Diagnosis not present

## 2022-04-08 DIAGNOSIS — E782 Mixed hyperlipidemia: Secondary | ICD-10-CM | POA: Diagnosis not present

## 2022-04-08 DIAGNOSIS — E039 Hypothyroidism, unspecified: Secondary | ICD-10-CM | POA: Diagnosis not present

## 2022-04-08 DIAGNOSIS — E1165 Type 2 diabetes mellitus with hyperglycemia: Secondary | ICD-10-CM | POA: Diagnosis not present

## 2022-04-08 DIAGNOSIS — I10 Essential (primary) hypertension: Secondary | ICD-10-CM | POA: Diagnosis not present

## 2022-04-08 DIAGNOSIS — I2510 Atherosclerotic heart disease of native coronary artery without angina pectoris: Secondary | ICD-10-CM | POA: Diagnosis not present

## 2022-04-11 ENCOUNTER — Encounter: Payer: Self-pay | Admitting: Radiology

## 2022-04-17 ENCOUNTER — Other Ambulatory Visit: Payer: Self-pay

## 2022-04-19 ENCOUNTER — Other Ambulatory Visit: Payer: Self-pay

## 2022-04-19 ENCOUNTER — Encounter (HOSPITAL_BASED_OUTPATIENT_CLINIC_OR_DEPARTMENT_OTHER): Payer: Self-pay | Admitting: General Surgery

## 2022-04-19 NOTE — Progress Notes (Signed)
Spoke with Abigail Butts at Dr. Ethlyn Gallery office regarding surgery scheduled 04/26/22, ok for patient to continue aspirin as prescribed

## 2022-04-19 NOTE — Progress Notes (Signed)
   04/19/22 1527  PAT Phone Screen  Is the patient taking a GLP-1 receptor agonist? Yes  Has the patient been informed on holding medication? Yes  Do You Have Diabetes? Yes  Do You Have Hypertension? Yes  Have You Ever Been to the ER for Asthma? No  Have You Taken Oral Steroids in the Past 3 Months? No  Do you Take Phenteramine or any Other Diet Drugs? No  Recent  Lab Work, EKG, CXR? Yes  Where was this test performed? EKG 03/26/22  Do you have a history of heart problems? Yes  Cardiologist Name Rex Kras, DO  Have you ever had tests on your heart? Yes  What cardiac tests were performed? Echo  What date/year were cardiac tests completed? Echo 2022 EF 50-55%  Results viewable: [facility] Media Tab  Any Recent Hospitalizations? No  Height 5\' 8"  (1.727 m)  Weight 118.8 kg  Pat Appointment Scheduled Yes

## 2022-04-22 ENCOUNTER — Encounter (HOSPITAL_BASED_OUTPATIENT_CLINIC_OR_DEPARTMENT_OTHER)
Admission: RE | Admit: 2022-04-22 | Discharge: 2022-04-22 | Disposition: A | Discharge status: Home / Self Care | Payer: Medicare HMO | Source: Ambulatory Visit | Attending: General Surgery | Admitting: General Surgery

## 2022-04-22 DIAGNOSIS — Z01812 Encounter for preprocedural laboratory examination: Secondary | ICD-10-CM | POA: Insufficient documentation

## 2022-04-22 LAB — BASIC METABOLIC PANEL
Anion gap: 8 (ref 5–15)
BUN: 15 mg/dL (ref 8–23)
CO2: 25 mmol/L (ref 22–32)
Calcium: 8.8 mg/dL — ABNORMAL LOW (ref 8.9–10.3)
Chloride: 108 mmol/L (ref 98–111)
Creatinine, Ser: 0.85 mg/dL (ref 0.44–1.00)
GFR, Estimated: >60 mL/min (ref >60)
Glucose, Bld: 100 mg/dL — ABNORMAL HIGH (ref 70–99)
Potassium: 3.8 mmol/L (ref 3.5–5.1)
Sodium: 141 mmol/L (ref 135–145)

## 2022-04-22 NOTE — Progress Notes (Signed)

## 2022-04-25 ENCOUNTER — Ambulatory Visit
Admission: RE | Admit: 2022-04-25 | Discharge: 2022-04-25 | Disposition: A | Discharge status: Home / Self Care | Payer: Medicare HMO | Source: Ambulatory Visit | Attending: General Surgery | Admitting: General Surgery

## 2022-04-25 DIAGNOSIS — R928 Other abnormal and inconclusive findings on diagnostic imaging of breast: Secondary | ICD-10-CM | POA: Diagnosis not present

## 2022-04-25 DIAGNOSIS — D242 Benign neoplasm of left breast: Secondary | ICD-10-CM

## 2022-04-25 HISTORY — PX: BREAST BIOPSY: SHX20

## 2022-04-26 ENCOUNTER — Other Ambulatory Visit: Payer: Self-pay

## 2022-04-26 ENCOUNTER — Encounter (HOSPITAL_BASED_OUTPATIENT_CLINIC_OR_DEPARTMENT_OTHER): Payer: Self-pay | Admitting: General Surgery

## 2022-04-26 ENCOUNTER — Encounter (HOSPITAL_BASED_OUTPATIENT_CLINIC_OR_DEPARTMENT_OTHER)
Admission: RE | Disposition: A | Discharge status: Home / Self Care | Payer: Self-pay | Source: Ambulatory Visit | Attending: General Surgery

## 2022-04-26 ENCOUNTER — Ambulatory Visit (HOSPITAL_BASED_OUTPATIENT_CLINIC_OR_DEPARTMENT_OTHER): Payer: Medicare HMO | Admitting: Anesthesiology

## 2022-04-26 ENCOUNTER — Ambulatory Visit (HOSPITAL_BASED_OUTPATIENT_CLINIC_OR_DEPARTMENT_OTHER)
Admission: RE | Admit: 2022-04-26 | Discharge: 2022-04-26 | Disposition: A | Discharge status: Home / Self Care | Payer: Medicare HMO | Source: Ambulatory Visit | Attending: General Surgery | Admitting: General Surgery

## 2022-04-26 ENCOUNTER — Ambulatory Visit
Admission: RE | Admit: 2022-04-26 | Discharge: 2022-04-26 | Disposition: A | Discharge status: Home / Self Care | Payer: Medicare HMO | Source: Ambulatory Visit | Attending: General Surgery | Admitting: General Surgery

## 2022-04-26 DIAGNOSIS — I2510 Atherosclerotic heart disease of native coronary artery without angina pectoris: Secondary | ICD-10-CM

## 2022-04-26 DIAGNOSIS — E119 Type 2 diabetes mellitus without complications: Secondary | ICD-10-CM | POA: Insufficient documentation

## 2022-04-26 DIAGNOSIS — I10 Essential (primary) hypertension: Secondary | ICD-10-CM | POA: Insufficient documentation

## 2022-04-26 DIAGNOSIS — Z79899 Other long term (current) drug therapy: Secondary | ICD-10-CM | POA: Insufficient documentation

## 2022-04-26 DIAGNOSIS — Z7985 Long-term (current) use of injectable non-insulin antidiabetic drugs: Secondary | ICD-10-CM | POA: Diagnosis not present

## 2022-04-26 DIAGNOSIS — G4730 Sleep apnea, unspecified: Secondary | ICD-10-CM | POA: Insufficient documentation

## 2022-04-26 DIAGNOSIS — Z955 Presence of coronary angioplasty implant and graft: Secondary | ICD-10-CM | POA: Diagnosis not present

## 2022-04-26 DIAGNOSIS — Z794 Long term (current) use of insulin: Secondary | ICD-10-CM

## 2022-04-26 DIAGNOSIS — D242 Benign neoplasm of left breast: Secondary | ICD-10-CM | POA: Insufficient documentation

## 2022-04-26 DIAGNOSIS — N6042 Mammary duct ectasia of left breast: Secondary | ICD-10-CM | POA: Diagnosis not present

## 2022-04-26 DIAGNOSIS — Z87891 Personal history of nicotine dependence: Secondary | ICD-10-CM | POA: Diagnosis not present

## 2022-04-26 DIAGNOSIS — R928 Other abnormal and inconclusive findings on diagnostic imaging of breast: Secondary | ICD-10-CM | POA: Diagnosis not present

## 2022-04-26 DIAGNOSIS — E785 Hyperlipidemia, unspecified: Secondary | ICD-10-CM | POA: Diagnosis not present

## 2022-04-26 DIAGNOSIS — E039 Hypothyroidism, unspecified: Secondary | ICD-10-CM | POA: Diagnosis not present

## 2022-04-26 DIAGNOSIS — I252 Old myocardial infarction: Secondary | ICD-10-CM | POA: Diagnosis not present

## 2022-04-26 DIAGNOSIS — Z91041 Radiographic dye allergy status: Secondary | ICD-10-CM | POA: Diagnosis not present

## 2022-04-26 DIAGNOSIS — Z01818 Encounter for other preprocedural examination: Secondary | ICD-10-CM

## 2022-04-26 DIAGNOSIS — Z79890 Hormone replacement therapy (postmenopausal): Secondary | ICD-10-CM | POA: Insufficient documentation

## 2022-04-26 DIAGNOSIS — Z6841 Body Mass Index (BMI) 40.0 and over, adult: Secondary | ICD-10-CM | POA: Insufficient documentation

## 2022-04-26 HISTORY — PX: BREAST LUMPECTOMY WITH RADIOACTIVE SEED LOCALIZATION: SHX6424

## 2022-04-26 LAB — GLUCOSE, CAPILLARY
Glucose-Capillary: 100 mg/dL — ABNORMAL HIGH (ref 70–99)
Glucose-Capillary: 97 mg/dL (ref 70–99)

## 2022-04-26 SURGERY — BREAST LUMPECTOMY WITH RADIOACTIVE SEED LOCALIZATION
Anesthesia: General | Site: Breast | Laterality: Left

## 2022-04-26 MED ORDER — TRAMADOL HCL 50 MG PO TABS: 50.0000 mg | ORAL_TABLET | Freq: Four times a day (QID) | ORAL | 0 refills | Status: DC | PRN

## 2022-04-26 MED ORDER — ONDANSETRON HCL 4 MG/2ML IJ SOLN
INTRAMUSCULAR | Status: DC | PRN
  Administered 2022-04-26: 4 mg via INTRAVENOUS

## 2022-04-26 MED ORDER — PROMETHAZINE HCL 25 MG/ML IJ SOLN: 6.2500 mg | INTRAMUSCULAR | Status: DC | PRN

## 2022-04-26 MED ORDER — EPHEDRINE 5 MG/ML INJ
INTRAVENOUS | Status: AC
  Filled 2022-04-26: qty 5

## 2022-04-26 MED ORDER — AMISULPRIDE (ANTIEMETIC) 5 MG/2ML IV SOLN: 10.0000 mg | Freq: Once | INTRAVENOUS | Status: DC | PRN

## 2022-04-26 MED ORDER — CHLORHEXIDINE GLUCONATE CLOTH 2 % EX PADS: 6.0000 | MEDICATED_PAD | Freq: Once | CUTANEOUS | Status: DC

## 2022-04-26 MED ORDER — LIDOCAINE 2% (20 MG/ML) 5 ML SYRINGE
INTRAMUSCULAR | Status: AC
  Filled 2022-04-26: qty 5

## 2022-04-26 MED ORDER — PROPOFOL 10 MG/ML IV BOLUS
INTRAVENOUS | Status: DC | PRN
  Administered 2022-04-26: 200 mg via INTRAVENOUS

## 2022-04-26 MED ORDER — KETOROLAC TROMETHAMINE 30 MG/ML IJ SOLN: 30.0000 mg | Freq: Once | INTRAMUSCULAR | Status: DC | PRN

## 2022-04-26 MED ORDER — BUPIVACAINE HCL (PF) 0.25 % IJ SOLN
INTRAMUSCULAR | Status: DC | PRN
  Administered 2022-04-26: 20 mL

## 2022-04-26 MED ORDER — FENTANYL CITRATE (PF) 100 MCG/2ML IJ SOLN: 25.0000 ug | INTRAMUSCULAR | Status: DC | PRN

## 2022-04-26 MED ORDER — DEXAMETHASONE SODIUM PHOSPHATE 10 MG/ML IJ SOLN
INTRAMUSCULAR | Status: AC
  Filled 2022-04-26: qty 1

## 2022-04-26 MED ORDER — FENTANYL CITRATE (PF) 100 MCG/2ML IJ SOLN
INTRAMUSCULAR | Status: AC
  Filled 2022-04-26: qty 2

## 2022-04-26 MED ORDER — MIDAZOLAM HCL 2 MG/2ML IJ SOLN
INTRAMUSCULAR | Status: AC
  Filled 2022-04-26: qty 2

## 2022-04-26 MED ORDER — LACTATED RINGERS IV SOLN: INTRAVENOUS | Status: DC

## 2022-04-26 MED ORDER — OXYCODONE HCL 5 MG/5ML PO SOLN: 5.0000 mg | Freq: Once | ORAL | Status: DC | PRN

## 2022-04-26 MED ORDER — OXYCODONE HCL 5 MG PO TABS: 5.0000 mg | ORAL_TABLET | Freq: Once | ORAL | Status: DC | PRN

## 2022-04-26 MED ORDER — ACETAMINOPHEN 500 MG PO TABS
ORAL_TABLET | ORAL | Status: AC
  Filled 2022-04-26: qty 2

## 2022-04-26 MED ORDER — MIDAZOLAM HCL 5 MG/5ML IJ SOLN
INTRAMUSCULAR | Status: DC | PRN
  Administered 2022-04-26: 2 mg via INTRAVENOUS

## 2022-04-26 MED ORDER — ACETAMINOPHEN 500 MG PO TABS
1000.0000 mg | ORAL_TABLET | ORAL | Status: AC
  Administered 2022-04-26: 1000 mg via ORAL

## 2022-04-26 MED ORDER — LIDOCAINE HCL (CARDIAC) PF 100 MG/5ML IV SOSY
PREFILLED_SYRINGE | INTRAVENOUS | Status: DC | PRN
  Administered 2022-04-26: 60 mg via INTRAVENOUS

## 2022-04-26 MED ORDER — PROPOFOL 10 MG/ML IV BOLUS
INTRAVENOUS | Status: AC
  Filled 2022-04-26: qty 20

## 2022-04-26 MED ORDER — CEFAZOLIN IN SODIUM CHLORIDE 3-0.9 GM/100ML-% IV SOLN
INTRAVENOUS | Status: AC
  Filled 2022-04-26: qty 100

## 2022-04-26 MED ORDER — CEFAZOLIN SODIUM-DEXTROSE 2-4 GM/100ML-% IV SOLN
2.0000 g | INTRAVENOUS | Status: AC
  Administered 2022-04-26: 3 g via INTRAVENOUS

## 2022-04-26 MED ORDER — GABAPENTIN 300 MG PO CAPS
ORAL_CAPSULE | ORAL | Status: AC
  Filled 2022-04-26: qty 1

## 2022-04-26 MED ORDER — DEXAMETHASONE SODIUM PHOSPHATE 4 MG/ML IJ SOLN
INTRAMUSCULAR | Status: DC | PRN
  Administered 2022-04-26: 5 mg via INTRAVENOUS

## 2022-04-26 MED ORDER — GABAPENTIN 300 MG PO CAPS
300.0000 mg | ORAL_CAPSULE | ORAL | Status: AC
  Administered 2022-04-26: 300 mg via ORAL

## 2022-04-26 MED ORDER — FENTANYL CITRATE (PF) 100 MCG/2ML IJ SOLN
INTRAMUSCULAR | Status: DC | PRN
  Administered 2022-04-26: 50 ug via INTRAVENOUS
  Administered 2022-04-26 (×2): 25 ug via INTRAVENOUS

## 2022-04-26 MED ORDER — ONDANSETRON HCL 4 MG/2ML IJ SOLN
INTRAMUSCULAR | Status: AC
  Filled 2022-04-26: qty 2

## 2022-04-26 SURGICAL SUPPLY — 39 items
ADH SKN CLS APL DERMABOND .7 (GAUZE/BANDAGES/DRESSINGS) ×1
APL PRP STRL LF DISP 70% ISPRP (MISCELLANEOUS) ×1
APPLIER CLIP 9.375 MED OPEN (MISCELLANEOUS)
APR CLP MED 9.3 20 MLT OPN (MISCELLANEOUS)
BLADE SURG 15 STRL LF DISP TIS (BLADE) ×1 IMPLANT
BLADE SURG 15 STRL SS (BLADE) ×1
CANISTER SUC SOCK COL 7IN (MISCELLANEOUS) ×1 IMPLANT
CANISTER SUCT 1200ML W/VALVE (MISCELLANEOUS) ×1 IMPLANT
CHLORAPREP W/TINT 26 (MISCELLANEOUS) ×1 IMPLANT
CLIP APPLIE 9.375 MED OPEN (MISCELLANEOUS) IMPLANT
COVER BACK TABLE 60X90IN (DRAPES) ×1 IMPLANT
COVER MAYO STAND STRL (DRAPES) ×1 IMPLANT
COVER PROBE CYLINDRICAL 5X96 (MISCELLANEOUS) ×1 IMPLANT
DERMABOND ADVANCED .7 DNX12 (GAUZE/BANDAGES/DRESSINGS) ×1 IMPLANT
DRAPE LAPAROSCOPIC ABDOMINAL (DRAPES) ×1 IMPLANT
DRAPE UTILITY XL STRL (DRAPES) ×1 IMPLANT
ELECT COATED BLADE 2.86 ST (ELECTRODE) ×1 IMPLANT
ELECT REM PT RETURN 9FT ADLT (ELECTROSURGICAL) ×1
ELECTRODE REM PT RTRN 9FT ADLT (ELECTROSURGICAL) ×1 IMPLANT
GLOVE BIO SURGEON STRL SZ7.5 (GLOVE) ×2 IMPLANT
GOWN STRL REUS W/ TWL LRG LVL3 (GOWN DISPOSABLE) ×2 IMPLANT
GOWN STRL REUS W/TWL LRG LVL3 (GOWN DISPOSABLE) ×2
KIT MARKER MARGIN INK (KITS) ×1 IMPLANT
NDL HYPO 25X1 1.5 SAFETY (NEEDLE) IMPLANT
NEEDLE HYPO 25X1 1.5 SAFETY (NEEDLE) ×1 IMPLANT
NS IRRIG 1000ML POUR BTL (IV SOLUTION) IMPLANT
PACK BASIN DAY SURGERY FS (CUSTOM PROCEDURE TRAY) ×1 IMPLANT
PENCIL SMOKE EVACUATOR (MISCELLANEOUS) ×1 IMPLANT
SLEEVE SCD COMPRESS KNEE MED (STOCKING) ×1 IMPLANT
SPIKE FLUID TRANSFER (MISCELLANEOUS) IMPLANT
SPONGE T-LAP 18X18 ~~LOC~~+RFID (SPONGE) ×1 IMPLANT
SUT MON AB 4-0 PC3 18 (SUTURE) ×1 IMPLANT
SUT SILK 2 0 SH (SUTURE) IMPLANT
SUT VICRYL 3-0 CR8 SH (SUTURE) ×1 IMPLANT
SYR CONTROL 10ML LL (SYRINGE) IMPLANT
TOWEL GREEN STERILE FF (TOWEL DISPOSABLE) ×1 IMPLANT
TRAY FAXITRON CT DISP (TRAY / TRAY PROCEDURE) ×1 IMPLANT
TUBE CONNECTING 20X1/4 (TUBING) ×1 IMPLANT
YANKAUER SUCT BULB TIP NO VENT (SUCTIONS) IMPLANT

## 2022-04-26 NOTE — Anesthesia Procedure Notes (Signed)
Procedure Name: LMA Insertion Date/Time: 04/26/2022 11:00 AM  Performed by: Tawni Millers, CRNAPre-anesthesia Checklist: Patient identified, Emergency Drugs available, Suction available and Patient being monitored Patient Re-evaluated:Patient Re-evaluated prior to induction Oxygen Delivery Method: Circle system utilized Preoxygenation: Pre-oxygenation with 100% oxygen Induction Type: IV induction Ventilation: Mask ventilation without difficulty LMA: LMA inserted LMA Size: 4.0 Number of attempts: 1 Airway Equipment and Method: Bite block Placement Confirmation: positive ETCO2 Tube secured with: Tape Dental Injury: Teeth and Oropharynx as per pre-operative assessment

## 2022-04-26 NOTE — Interval H&P Note (Signed)
History and Physical Interval Note:  04/26/2022 10:23 AM  Ricci Barker Eiche  has presented today for surgery, with the diagnosis of LEFT BREAST PAPILLOMA.  The various methods of treatment have been discussed with the patient and family. After consideration of risks, benefits and other options for treatment, the patient has consented to  Procedure(s): LEFT BREAST LUMPECTOMY WITH RADIOACTIVE SEED LOCALIZATION (Left) as a surgical intervention.  The patient's history has been reviewed, patient examined, no change in status, stable for surgery.  I have reviewed the patient's chart and labs.  Questions were answered to the patient's satisfaction.     Autumn Messing III

## 2022-04-26 NOTE — Anesthesia Preprocedure Evaluation (Addendum)
Anesthesia Evaluation  Patient identified by MRN, date of birth, ID band Patient awake    Reviewed: Allergy & Precautions, NPO status , Patient's Chart, lab work & pertinent test results  Airway Mallampati: II  TM Distance: >3 FB Neck ROM: Full    Dental no notable dental hx.    Pulmonary sleep apnea , former smoker   Pulmonary exam normal        Cardiovascular hypertension, Pt. on medications and Pt. on home beta blockers + CAD, + Past MI and + Cardiac Stents  Normal cardiovascular exam  coronary vasospasm    Neuro/Psych negative neurological ROS  negative psych ROS   GI/Hepatic negative GI ROS, Neg liver ROS,,,  Endo/Other  diabetes, Insulin DependentHypothyroidism  Morbid obesityPatient on GLP-1 Agonist. Last dose 3.12.24  Renal/GU negative Renal ROS     Musculoskeletal negative musculoskeletal ROS (+)    Abdominal   Peds  Hematology negative hematology ROS (+)   Anesthesia Other Findings LEFT BREAST PAPILLOMA  Reproductive/Obstetrics                             Anesthesia Physical Anesthesia Plan  ASA: 3  Anesthesia Plan: General   Post-op Pain Management:    Induction: Intravenous  PONV Risk Score and Plan: 3 and Ondansetron, Dexamethasone, Midazolam and Treatment may vary due to age or medical condition  Airway Management Planned: LMA  Additional Equipment:   Intra-op Plan:   Post-operative Plan: Extubation in OR  Informed Consent: I have reviewed the patients History and Physical, chart, labs and discussed the procedure including the risks, benefits and alternatives for the proposed anesthesia with the patient or authorized representative who has indicated his/her understanding and acceptance.     Dental advisory given  Plan Discussed with: CRNA  Anesthesia Plan Comments:        Anesthesia Quick Evaluation

## 2022-04-26 NOTE — H&P (Signed)
REFERRING PHYSICIAN: Trey Sailors, [place] PROVIDER: Landry Corporal, MD MRN: [id number] DOB: 02/01/1961 DATE OF ENCOUNTER: 03/21/2022 Subjective   Chief Complaint: New Consultation (Left Breast )  History of Present Illness: [name] is a 62 y.o. female who is seen today as an office consultation for evaluation of New Consultation (Left Breast )  We are asked to see the patient in consultation by Dr. Arletta Bale to evaluate her for a papilloma of the left breast. The patient is a 62 year old black female who recently went for a routine screening mammogram. At that time she was found to have a 9 mm mass behind the left nipple. This was biopsied and came back as an intraductal papilloma. She denies any breast pain or discharge from the nipple. She has no family history of breast cancer. She is followed by Dr. Chrisandra Carota and cardiology for a stent that was placed in her heart many years ago  Review of Systems: A complete review of systems was obtained from the patient. I have reviewed this information and discussed as appropriate with the patient. See HPI as well for other ROS.  ROS   Medical History: Past Medical History:  Diagnosis Date  Arthritis  Diabetes mellitus without complication (CMS-HCC)  Hyperlipidemia  Hypertension  Thyroid disease   Patient Active Problem List  Diagnosis  Intraductal papilloma of breast, left   History reviewed. No pertinent surgical history.   Allergies  Allergen Reactions  Codeine Itching  Iodinated Contrast Media Swelling  "skin came off"  Skin Peeling and break out  Other Rash  NO PLASTIC TAPE  NO PLASTIC TAPE - Blisters and skin removal   Current Outpatient Medications on File Prior to Visit  Medication Sig Dispense Refill  amLODIPine-valsartan (EXFORGE) 5-160 mg tablet Take 1 tablet by mouth once daily  aspirin 81 MG chewable tablet Take 81 mg by mouth once daily  LANTUS SOLOSTAR U-100 INSULIN pen injector (concentration 100  units/mL) Inject subcutaneously  levothyroxine (SYNTHROID) 200 MCG tablet Take 1 tablet by mouth once daily  metoprolol succinate (TOPROL-XL) 50 MG XL tablet Take 1 tablet by mouth once daily  MITIGARE 0.6 mg capsule Take 1 capsule by mouth once daily  NOVOLOG FLEXPEN U-100 INSULIN pen injector (concentration 100 units/mL) INJECT 2 TO 15 UNITS UNDER THE SKIN THREE TIMES DAILY BEFORE MEALS  OZEMPIC 0.25 mg or 0.5 mg (2 mg/3 mL) pen injector INJECT 0.5 MG INTO THE SKIN ONCE A WEEK  rosuvastatin (CRESTOR) 40 MG tablet Take 1 tablet by mouth once daily   No current facility-administered medications on file prior to visit.   History reviewed. No pertinent family history.   Social History   Tobacco Use  Smoking Status Former  Years: 15  Types: Cigarettes  Smokeless Tobacco Never    Social History   Socioeconomic History  Marital status: Divorced  Tobacco Use  Smoking status: Former  Years: 15  Types: Cigarettes  Smokeless tobacco: Never  Substance and Sexual Activity  Alcohol use: Yes  Drug use: Never   Objective:  There were no vitals filed for this visit.  There is no height or weight on file to calculate BMI.  Physical Exam Vitals reviewed.  Constitutional:  General: She is not in acute distress. Appearance: Normal appearance.  HENT:  Head: Normocephalic and atraumatic.  Right Ear: External ear normal.  Left Ear: External ear normal.  Nose: Nose normal.  Mouth/Throat:  Mouth: Mucous membranes are moist.  Pharynx: Oropharynx is clear.  Eyes:  General:  No scleral icterus. Extraocular Movements: Extraocular movements intact.  Conjunctiva/sclera: Conjunctivae normal.  Pupils: Pupils are equal, round, and reactive to light.  Cardiovascular:  Rate and Rhythm: Normal rate and regular rhythm.  Pulses: Normal pulses.  Heart sounds: Normal heart sounds.  Pulmonary:  Effort: Pulmonary effort is normal. No respiratory distress.  Breath sounds: Normal breath sounds.   Abdominal:  General: Bowel sounds are normal.  Palpations: Abdomen is soft.  Tenderness: There is no abdominal tenderness.  Musculoskeletal:  General: No swelling, tenderness or deformity. Normal range of motion.  Cervical back: Normal range of motion and neck supple.  Skin: General: Skin is warm and dry.  Coloration: Skin is not jaundiced.  Neurological:  General: No focal deficit present.  Mental Status: She is alert and oriented to person, place, and time.  Psychiatric:  Mood and Affect: Mood normal.  Behavior: Behavior normal.     Breast: There is no palpable mass in either breast. There is no palpable axillary, supraclavicular, or cervical lymphadenopathy.  Labs, Imaging and Diagnostic Testing:  Assessment and Plan:   Diagnoses and all orders for this visit:  Intraductal papilloma of breast, left   The patient appears to have a 9 mm intraductal papilloma behind the nipple of the left breast. Because of its appearance and because there is a 5 to 10% chance of missing something more significant the recommendation is to have this area removed. She would also like to have this done. I have discussed with her in detail the risks and benefits of the operation as well as some of the technical aspects including use of a radioactive seed for localization and she understands and wishes to proceed. We will contact her cardiologist for clearance prior to surgery although she was recently cleared for back surgery so I do not think this will be a problem.

## 2022-04-26 NOTE — Op Note (Signed)
04/26/2022  11:25 AM  PATIENT:  [name]  62 y.o. female  PRE-OPERATIVE DIAGNOSIS:  LEFT BREAST PAPILLOMA  POST-OPERATIVE DIAGNOSIS:  LEFT BREAST PAPILLOMA  PROCEDURE:  Procedure(s): LEFT BREAST LUMPECTOMY WITH RADIOACTIVE SEED LOCALIZATION (Left)  SURGEON:  Surgeon(s) and Role:    * Jovita Kussmaul, MD - Primary  PHYSICIAN ASSISTANT:   ASSISTANTS: none   ANESTHESIA:   local and general  EBL:  10 mL   BLOOD ADMINISTERED:none  DRAINS: none   LOCAL MEDICATIONS USED:  MARCAINE     SPECIMEN:  Source of Specimen:  left breast tissue  DISPOSITION OF SPECIMEN:  PATHOLOGY  COUNTS:  YES  TOURNIQUET:  * No tourniquets in log *  DICTATION: .Dragon Dictation  After informed consent was obtained the patient was brought to the operating room and placed in the supine position on the operating table.  After adequate induction of general anesthesia the patient's left breast was prepped with ChloraPrep, allowed to dry, and draped in usual sterile manner.  An appropriate timeout was performed.  Previously an I-125 seed was placed in the outer subareolar left breast to mark an area of intraductal papilloma.  The neoprobe was set to I-125 in the area of radioactivity was readily identified.  The area around this was infiltrated with quarter percent Marcaine.  A curvilinear incision was made along the upper outer edge of the areola of the left breast with a 15 blade knife.  The incision was carried through the skin and subcutaneous tissue sharply with the electrocautery.  Dissection was then carried towards the radioactive seed under the direction of the neoprobe.  Once I more closely approach the radioactive seed I then removed a circular portion of breast tissue sharply with the electrocautery around the radioactive seed while checking the area of radioactivity frequently.  Once the specimen was removed it was oriented with the appropriate paint colors.  A specimen radiograph was obtained  that showed the clip and seed to be within the specimen.  The specimen was then sent to pathology for further evaluation.  Hemostasis was achieved using the Bovie electrocautery.  The wound was irrigated with saline and infiltrated with more quarter percent Marcaine.  The deep layer of the wound was then closed with layers of interrupted 3-0 Vicryl stitches.  The skin was then closed with interrupted 4-0 Monocryl subcuticular stitches.  Dermabond dressings were applied.  The patient tolerated the procedure well.  At the end of the case all needle sponge and instrument counts were correct.  The patient was then awakened and taken to recovery in stable condition.  PLAN OF CARE: Discharge to home after PACU  PATIENT DISPOSITION:  PACU - hemodynamically stable.   Delay start of Pharmacological VTE agent (>24hrs) due to surgical blood loss or risk of bleeding: not applicable

## 2022-04-26 NOTE — Transfer of Care (Signed)
Immediate Anesthesia Transfer of Care Note  Patient: [name]  Procedure(s) Performed: LEFT BREAST LUMPECTOMY WITH RADIOACTIVE SEED LOCALIZATION (Left: Breast)  Patient Location: PACU  Anesthesia Type:General  Level of Consciousness: awake  Airway & Oxygen Therapy: Patient Spontanous Breathing and Patient connected to face mask oxygen  Post-op Assessment: Report given to RN and Post -op Vital signs reviewed and stable  Post vital signs: Reviewed and stable  Last Vitals:  Vitals Value Taken Time  BP    Temp    Pulse 76 04/26/22 1131  Resp 6 04/26/22 1131  SpO2 98 % 04/26/22 1131  Vitals shown include unvalidated device data.  Last Pain:  Vitals:   04/26/22 0937  TempSrc: Oral  PainSc: 0-No pain      Patients Stated Pain Goal: 5 ([id number])  Complications: No notable events documented.

## 2022-04-26 NOTE — Discharge Instructions (Addendum)
*  NO ADDITIONAL TYLENOL UNTIL AFTER 3:44PM  Post Anesthesia Home Care Instructions  Activity: Get plenty of rest for the remainder of the day. A responsible individual must stay with you for 24 hours following the procedure.  For the next 24 hours, DO NOT: -Drive a car -Paediatric nurse -Drink alcoholic beverages -Take any medication unless instructed by your physician -Make any legal decisions or sign important papers.  Meals: Start with liquid foods such as gelatin or soup. Progress to regular foods as tolerated. Avoid greasy, spicy, heavy foods. If nausea and/or vomiting occur, drink only clear liquids until the nausea and/or vomiting subsides. Call your physician if vomiting continues.  Special Instructions/Symptoms: Your throat may feel dry or sore from the anesthesia or the breathing tube placed in your throat during surgery. If this causes discomfort, gargle with warm salt water. The discomfort should disappear within 24 hours.  If you had a scopolamine patch placed behind your ear for the management of post- operative nausea and/or vomiting:  1. The medication in the patch is effective for 72 hours, after which it should be removed.  Wrap patch in a tissue and discard in the trash. Wash hands thoroughly with soap and water. 2. You may remove the patch earlier than 72 hours if you experience unpleasant side effects which may include dry mouth, dizziness or visual disturbances. 3. Avoid touching the patch. Wash your hands with soap and water after contact with the patch.

## 2022-04-27 ENCOUNTER — Other Ambulatory Visit: Payer: Self-pay | Admitting: Physician Assistant

## 2022-04-27 DIAGNOSIS — Z01818 Encounter for other preprocedural examination: Secondary | ICD-10-CM

## 2022-04-29 ENCOUNTER — Encounter (HOSPITAL_BASED_OUTPATIENT_CLINIC_OR_DEPARTMENT_OTHER): Payer: Self-pay | Admitting: General Surgery

## 2022-04-29 LAB — SURGICAL PATHOLOGY

## 2022-05-06 NOTE — Anesthesia Postprocedure Evaluation (Signed)
Anesthesia Post Note  Patient: [name]  Procedure(s) Performed: LEFT BREAST LUMPECTOMY WITH RADIOACTIVE SEED LOCALIZATION (Left: Breast)     Patient location during evaluation: PACU Anesthesia Type: General Level of consciousness: awake Pain management: pain level controlled Vital Signs Assessment: post-procedure vital signs reviewed and stable Respiratory status: spontaneous breathing, nonlabored ventilation and respiratory function stable Cardiovascular status: blood pressure returned to baseline and stable Postop Assessment: no apparent nausea or vomiting Anesthetic complications: no   No notable events documented.  Last Vitals:  Vitals:   04/26/22 1153 04/26/22 1205  BP:  132/74  Pulse: 74 79  Resp: (!) 27 20  Temp:  (!) 36.3 C  SpO2: 94% 94%    Last Pain:  Vitals:   04/26/22 1205  TempSrc:   PainSc: 0-No pain                 Marrisa Kimber P Kiev Labrosse

## 2022-05-09 ENCOUNTER — Encounter (HOSPITAL_COMMUNITY): Payer: Self-pay

## 2022-05-20 NOTE — Pre-Procedure Instructions (Signed)
Surgical Instructions    Your procedure is scheduled on Tuesday, April 23.  Report to [facility] Main Entrance "A" at 5:30 A.M., then check in with the Admitting office.  Call this number if you have problems the morning of surgery:  [phone]   If you have any questions prior to your surgery date call [phone]: Open Monday-Friday 8am-4pm If you experience any cold or flu symptoms such as cough, fever, chills, shortness of breath, etc. between now and your scheduled surgery, please notify us at the above number     Remember:  Do not eat after midnight the night before your surgery  You may drink clear liquids until 4:30AM the morning of your surgery.   Clear liquids allowed are: Water, Non-Citrus Juices (without pulp), Carbonated Beverages, Clear Tea, Black Coffee ONLY (NO MILK, CREAM OR POWDERED CREAMER of any kind), and Gatorade  Patient Instructions  The night before surgery:  No food after midnight. ONLY clear liquids after midnight    The day of surgery (if you have diabetes): Drink ONE (1) 12 oz G2 given to you in your pre admission testing appointment by 4:30AM the morning of surgery. Drink in one sitting. Do not sip.  This drink was given to you during your hospital  pre-op appointment visit.  Nothing else to drink after completing the  12 oz bottle of G2.         If you have questions, please contact your surgeon's office.     Take these medicines the morning of surgery with A SIP OF WATER:  isosorbide dinitrate (ISORDIL)  levothyroxine (SYNTHROID) metoprolol succinate (TOPROL-XL)  Mitigare rosuvastatin (CRESTOR)   IF Needed: traMADol (ULTRAM)   Follow your surgeon's instructions on when to stop Aspirin.  If no instructions were given by your surgeon then you will need to call the office to get those instructions.    As of today, STOP taking any Aleve, Naproxen, Ibuprofen, Motrin, Advil, Goody's, BC's, all herbal medications, fish oil, and all  vitamins.         WHAT DO I DO ABOUT MY DIABETES MEDICATION?   Do not take oral diabetes medicines (pills) the morning of surgery.  THE NIGHT BEFORE SURGERY, take 5 units (half dose) of Lantus insulin  DO NOT TAKE Ozempic for 7 days prior to surgery. Do not take Ozempic after 05/20/22  DO NOT TAKE night time dose of Novolog the night before surgery.    The day of surgery, do not take other diabetes injectables, including Byetta (exenatide), Bydureon (exenatide ER), Victoza (liraglutide), or Trulicity (dulaglutide).  The day of surgery, If your CBG is greater than 220 mg/dL, you may take  of your sliding scale (correction) dose of Novolog insulin.   HOW TO MANAGE YOUR DIABETES BEFORE AND AFTER SURGERY  Why is it important to control my blood sugar before and after surgery? Improving blood sugar levels before and after surgery helps healing and can limit problems. A way of improving blood sugar control is eating a healthy diet by:  Eating less sugar and carbohydrates  Increasing activity/exercise  Talking with your doctor about reaching your blood sugar goals High blood sugars (greater than 180 mg/dL) can raise your risk of infections and slow your recovery, so you will need to focus on controlling your diabetes during the weeks before surgery. Make sure that the doctor who takes care of your diabetes knows about your planned surgery including the date and location.  How do I manage my blood sugar  before surgery? Check your blood sugar at least 4 times a day, starting 2 days before surgery, to make sure that the level is not too high or low.  Check your blood sugar the morning of your surgery when you wake up and every 2 hours until you get to the Short Stay unit.  If your blood sugar is less than 70 mg/dL, you will need to treat for low blood sugar: Do not take insulin. Treat a low blood sugar (less than 70 mg/dL) with  cup of clear juice (cranberry or apple), 4 glucose tablets,  OR glucose gel. Recheck blood sugar in 15 minutes after treatment (to make sure it is greater than 70 mg/dL). If your blood sugar is not greater than 70 mg/dL on recheck, call [phone] for further instructions. Report your blood sugar to the short stay nurse when you get to Short Stay.  If you are admitted to the hospital after surgery: Your blood sugar will be checked by the staff and you will probably be given insulin after surgery (instead of oral diabetes medicines) to make sure you have good blood sugar levels. The goal for blood sugar control after surgery is 80-180 mg/dL.    [facility] is not responsible for any belongings or valuables.    Do NOT Smoke (Tobacco/Vaping)  24 hours prior to your procedure  If you use a CPAP at night, you may bring your mask for your overnight stay.   Contacts, glasses, hearing aids, dentures or partials may not be worn into surgery, please bring cases for these belongings   For patients admitted to the hospital, discharge time will be determined by your treatment team.   Patients discharged the day of surgery will not be allowed to drive home, and someone needs to stay with them for 24 hours.   SURGICAL WAITING ROOM VISITATION Patients having surgery or a procedure may have no more than 2 support people in the waiting area - these visitors may rotate.   Children under the age of 63 must have an adult with them who is not the patient. If the patient needs to stay at the hospital during part of their recovery, the visitor guidelines for inpatient rooms apply. Pre-op nurse will coordinate an appropriate time for 1 support person to accompany patient in pre-op.  This support person may not rotate.   Please refer to [email or web address] for the visitor guidelines for Inpatients (after your surgery is over and you are in a regular room).    Special instructions:    Oral Hygiene is also important to  reduce your risk of infection.  Remember - BRUSH YOUR TEETH THE MORNING OF SURGERY WITH YOUR REGULAR TOOTHPASTE    PLEASE follow attached instructions on using CHG soap prior to surgery.   Day of Surgery:  Take a shower with CHG soap. Wear Clean/Comfortable clothing the morning of surgery Do not wear jewelry or makeup. Do not wear lotions, powders, perfumes/cologne or deodorant. Do not shave 48 hours prior to surgery.  Men may shave face and neck. Do not bring valuables to the hospital. Do not wear nail polish, gel polish, artificial nails, or any other type of covering on natural nails (fingers and toes) If you have artificial nails or gel coating that need to be removed by a nail salon, please have this removed prior to surgery. Artificial nails or gel coating may interfere with anesthesia's ability to adequately monitor your vital signs. Remember to brush your teeth WITH YOUR REGULAR  TOOTHPASTE.    If you received a COVID test during your pre-op visit, it is requested that you wear a mask when out in public, stay away from anyone that may not be feeling well, and notify your surgeon if you develop symptoms. If you have been in contact with anyone that has tested positive in the last 10 days, please notify your surgeon.    Please read over the following fact sheets that you were given.

## 2022-05-21 ENCOUNTER — Encounter (HOSPITAL_COMMUNITY)
Admission: RE | Admit: 2022-05-21 | Discharge: 2022-05-21 | Disposition: A | Discharge status: Home / Self Care | Payer: Medicare HMO | Source: Ambulatory Visit | Attending: Orthopaedic Surgery | Admitting: Orthopaedic Surgery

## 2022-05-21 ENCOUNTER — Other Ambulatory Visit: Payer: Self-pay

## 2022-05-21 ENCOUNTER — Encounter (HOSPITAL_COMMUNITY): Payer: Self-pay

## 2022-05-21 VITALS — BP 128/63 | HR 59 | Temp 98.2°F | Resp 17 | Ht 68.0 in | Wt 256.8 lb

## 2022-05-21 DIAGNOSIS — G4733 Obstructive sleep apnea (adult) (pediatric): Secondary | ICD-10-CM | POA: Diagnosis not present

## 2022-05-21 DIAGNOSIS — I2510 Atherosclerotic heart disease of native coronary artery without angina pectoris: Secondary | ICD-10-CM | POA: Diagnosis not present

## 2022-05-21 DIAGNOSIS — Z853 Personal history of malignant neoplasm of breast: Secondary | ICD-10-CM | POA: Diagnosis not present

## 2022-05-21 DIAGNOSIS — Z955 Presence of coronary angioplasty implant and graft: Secondary | ICD-10-CM | POA: Diagnosis not present

## 2022-05-21 DIAGNOSIS — Z794 Long term (current) use of insulin: Secondary | ICD-10-CM | POA: Diagnosis not present

## 2022-05-21 DIAGNOSIS — M1611 Unilateral primary osteoarthritis, right hip: Secondary | ICD-10-CM | POA: Insufficient documentation

## 2022-05-21 DIAGNOSIS — Z01812 Encounter for preprocedural laboratory examination: Secondary | ICD-10-CM | POA: Insufficient documentation

## 2022-05-21 DIAGNOSIS — E669 Obesity, unspecified: Secondary | ICD-10-CM | POA: Diagnosis not present

## 2022-05-21 DIAGNOSIS — I252 Old myocardial infarction: Secondary | ICD-10-CM | POA: Diagnosis not present

## 2022-05-21 DIAGNOSIS — E1159 Type 2 diabetes mellitus with other circulatory complications: Secondary | ICD-10-CM | POA: Insufficient documentation

## 2022-05-21 DIAGNOSIS — Z87891 Personal history of nicotine dependence: Secondary | ICD-10-CM | POA: Diagnosis not present

## 2022-05-21 DIAGNOSIS — Z01818 Encounter for other preprocedural examination: Secondary | ICD-10-CM

## 2022-05-21 HISTORY — DX: Unspecified osteoarthritis, unspecified site: M19.90

## 2022-05-21 LAB — COMPREHENSIVE METABOLIC PANEL
ALT: 26 U/L (ref 0–44)
AST: 18 U/L (ref 15–41)
Albumin: 3.7 g/dL (ref 3.5–5.0)
Alkaline Phosphatase: 61 U/L (ref 38–126)
Anion gap: 10 (ref 5–15)
BUN: 19 mg/dL (ref 8–23)
CO2: 23 mmol/L (ref 22–32)
Calcium: 9.2 mg/dL (ref 8.9–10.3)
Chloride: 107 mmol/L (ref 98–111)
Creatinine, Ser: 1.11 mg/dL — ABNORMAL HIGH (ref 0.44–1.00)
GFR, Estimated: 57 mL/min — ABNORMAL LOW (ref >60)
Glucose, Bld: 130 mg/dL — ABNORMAL HIGH (ref 70–99)
Potassium: 3.9 mmol/L (ref 3.5–5.1)
Sodium: 140 mmol/L (ref 135–145)
Total Bilirubin: 0.8 mg/dL (ref 0.3–1.2)
Total Protein: 7.4 g/dL (ref 6.5–8.1)

## 2022-05-21 LAB — CBC
HCT: 46.2 % — ABNORMAL HIGH (ref 36.0–46.0)
Hemoglobin: 14.5 g/dL (ref 12.0–15.0)
MCH: 28.8 pg (ref 26.0–34.0)
MCHC: 31.4 g/dL (ref 30.0–36.0)
MCV: 91.7 fL (ref 80.0–100.0)
Platelets: 315 10*3/uL (ref 150–400)
RBC: 5.04 MIL/uL (ref 3.87–5.11)
RDW: 14.7 % (ref 11.5–15.5)
WBC: 9.5 10*3/uL (ref 4.0–10.5)
nRBC: 0 % (ref 0.0–0.2)

## 2022-05-21 LAB — HEMOGLOBIN A1C
Hgb A1c MFr Bld: 6.3 % — ABNORMAL HIGH (ref 4.8–5.6)
Mean Plasma Glucose: 134.11 mg/dL

## 2022-05-21 LAB — TYPE AND SCREEN
ABO/RH(D): B POS
Antibody Screen: NEGATIVE

## 2022-05-21 LAB — SURGICAL PCR SCREEN
MRSA, PCR: NEGATIVE
Staphylococcus aureus: NEGATIVE

## 2022-05-21 LAB — GLUCOSE, CAPILLARY: Glucose-Capillary: 126 mg/dL — ABNORMAL HIGH (ref 70–99)

## 2022-05-21 NOTE — Progress Notes (Signed)
PCP - Jackie Plum, MD Cardiologist - Marga Hoots, MD  PPM/ICD - Denies  Chest x-ray - Denies EKG - 03/26/2022 Stress Test - 06/07/2020 ECHO - 06/10/2020 Cardiac Cath - 08/19/2017  Sleep Study - On 08/17/2018 CPAP - Patient states she has lost over 70 lbs and does not currently wear her CPAP. Patient states sleep has improved since weight loss.  DM: Type II Fasting Blood Sugar ranges between 105-118 Checks Blood Sugar five times a day  Last dose of GLP1 agonist-  05/14/2022 GLP1 instructions: Hold Ozempic seven days prior to Surgery.  Blood Thinner Instructions: N/A Aspirin Instructions: Call surgeons office on when to hold aspirin prior to procedure.  ERAS Protcol - Yes PRE-SURGERY Ensure or G2- G2  COVID TEST- N/A   Anesthesia review: Yes, cardiac History.  Patient denies shortness of breath, fever, cough and chest pain at PAT appointment   All instructions explained to the patient, with a verbal understanding of the material. Patient agrees to go over the instructions while at home for a better understanding. The opportunity to ask questions was provided.

## 2022-05-21 NOTE — Pre-Procedure Instructions (Signed)
Surgical Instructions    Your procedure is scheduled on Tuesday, May 28, 2022  Report to [facility] Main Entrance "A" at 5:30 A.M., then check in with the Admitting office.  Call this number if you have problems the morning of surgery:  [phone]   If you have any questions prior to your surgery date call [phone]: Open Monday-Friday 8am-4pm If you experience any cold or flu symptoms such as cough, fever, chills, shortness of breath, etc. between now and your scheduled surgery, please notify us at the above number     Remember:  Do not eat after midnight the night before your surgery  You may drink clear liquids until 4:30AM the morning of your surgery.   Clear liquids allowed are: Water, Non-Citrus Juices (without pulp), Carbonated Beverages, Clear Tea, Black Coffee ONLY (NO MILK, CREAM OR POWDERED CREAMER of any kind), and Gatorade   Enhanced Recovery after Surgery for Orthopedics Enhanced Recovery after Surgery is a protocol used to improve the stress on your body and your recovery after surgery.  Patient Instructions  The day of surgery (if you have diabetes):  Drink ONE small 10 oz bottle of water by 4:30 am the morning of surgery This bottle was given to you during your hospital  pre-op appointment visit.  Nothing else to drink after completing the  Small 10 oz bottle of water.         If you have questions, please contact your surgeon's office.     Take these medicines the morning of surgery with A SIP OF WATER:  isosorbide dinitrate (ISORDIL)  levothyroxine (SYNTHROID) metoprolol succinate (TOPROL-XL)  Mitigare rosuvastatin (CRESTOR)  isosorbide mononitrate (IMDUR)   If Needed: traMADol (ULTRAM)  nitroGLYCERIN (NITROSTAT)   Follow your surgeon's instructions on when to stop Aspirin.  If no instructions were given by your surgeon then you will need to call the office to get those instructions.    As of today, STOP taking any Aleve, Naproxen, Ibuprofen,  Motrin, Advil, Goody's, BC's, all herbal medications, fish oil, and all vitamins.         WHAT DO I DO ABOUT MY DIABETES MEDICATION?   Do not take oral diabetes medicines (pills) the morning of surgery.  THE NIGHT BEFORE SURGERY, take 5 units (half dose) of Lantus insulin  DO NOT TAKE Ozempic for 7 days prior to surgery. Do not take Ozempic after 05/21/22  DO NOT TAKE night time dose of Novolog the night before surgery.    The day of surgery, do not take other diabetes injectables, including Byetta (exenatide), Bydureon (exenatide ER), Victoza (liraglutide), or Trulicity (dulaglutide).  The day of surgery, If your CBG is greater than 220 mg/dL, you may take  of your sliding scale (correction) dose of Novolog insulin.   HOW TO MANAGE YOUR DIABETES BEFORE AND AFTER SURGERY  Why is it important to control my blood sugar before and after surgery? Improving blood sugar levels before and after surgery helps healing and can limit problems. A way of improving blood sugar control is eating a healthy diet by:  Eating less sugar and carbohydrates  Increasing activity/exercise  Talking with your doctor about reaching your blood sugar goals High blood sugars (greater than 180 mg/dL) can raise your risk of infections and slow your recovery, so you will need to focus on controlling your diabetes during the weeks before surgery. Make sure that the doctor who takes care of your diabetes knows about your planned surgery including the date and location.  How do I manage my blood sugar before surgery? Check your blood sugar at least 4 times a day, starting 2 days before surgery, to make sure that the level is not too high or low.  Check your blood sugar the morning of your surgery when you wake up and every 2 hours until you get to the Short Stay unit.  If your blood sugar is less than 70 mg/dL, you will need to treat for low blood sugar: Do not take insulin. Treat a low blood sugar (less than 70  mg/dL) with  cup of clear juice (cranberry or apple), 4 glucose tablets, OR glucose gel. Recheck blood sugar in 15 minutes after treatment (to make sure it is greater than 70 mg/dL). If your blood sugar is not greater than 70 mg/dL on recheck, call [phone] for further instructions. Report your blood sugar to the short stay nurse when you get to Short Stay.  If you are admitted to the hospital after surgery: Your blood sugar will be checked by the staff and you will probably be given insulin after surgery (instead of oral diabetes medicines) to make sure you have good blood sugar levels. The goal for blood sugar control after surgery is 80-180 mg/dL.    [facility] is not responsible for any belongings or valuables.    Do NOT Smoke (Tobacco/Vaping)  24 hours prior to your procedure  If you use a CPAP at night, you may bring your mask for your overnight stay.   Contacts, glasses, hearing aids, dentures or partials may not be worn into surgery, please bring cases for these belongings   For patients admitted to the hospital, discharge time will be determined by your treatment team.   Patients discharged the day of surgery will not be allowed to drive home, and someone needs to stay with them for 24 hours.   SURGICAL WAITING ROOM VISITATION Patients having surgery or a procedure may have no more than 2 support people in the waiting area - these visitors may rotate.   Children under the age of 41 must have an adult with them who is not the patient. If the patient needs to stay at the hospital during part of their recovery, the visitor guidelines for inpatient rooms apply. Pre-op nurse will coordinate an appropriate time for 1 support person to accompany patient in pre-op.  This support person may not rotate.   Please refer to [email or web address] for the visitor guidelines for Inpatients (after your surgery is over and you are in a regular  room).    Special instructions:    Oral Hygiene is also important to reduce your risk of infection.  Remember - BRUSH YOUR TEETH THE MORNING OF SURGERY WITH YOUR REGULAR TOOTHPASTE    PLEASE follow attached instructions on using CHG soap prior to surgery.   Day of Surgery:  Take a shower with CHG soap. Wear Clean/Comfortable clothing the morning of surgery Do not wear jewelry or makeup. Do not wear lotions, powders, perfumes/cologne or deodorant. Do not shave 48 hours prior to surgery.  Men may shave face and neck. Do not bring valuables to the hospital. Do not wear nail polish, gel polish, artificial nails, or any other type of covering on natural nails (fingers and toes) If you have artificial nails or gel coating that need to be removed by a nail salon, please have this removed prior to surgery. Artificial nails or gel coating may interfere with anesthesia's ability to adequately monitor your vital signs. Remember  to brush your teeth WITH YOUR REGULAR TOOTHPASTE.    If you received a COVID test during your pre-op visit, it is requested that you wear a mask when out in public, stay away from anyone that may not be feeling well, and notify your surgeon if you develop symptoms. If you have been in contact with anyone that has tested positive in the last 10 days, please notify your surgeon.    Please read over the following fact sheets that you were given.

## 2022-05-21 NOTE — Progress Notes (Unsigned)
Name: [name]  MRN/ DOB: [id number], 04-05-1960   Age/ Sex: 62 y.o., female    PCP: Jackie Plum, MD   Reason for Endocrinology Evaluation: Type 2 Diabetes Mellitus     Date of Initial Endocrinology Visit: 03/16/2021    PATIENT IDENTIFIER: [name] is a 62 y.o. female with a past medical history of T2DM, HTN, dyslipidemia and CAD (status post PCI). The patient presented for initial endocrinology clinic visit on 03/16/2021 for consultative assistance with her diabetes management.    HPI: [name] was    Diagnosed with DM 2013 Prior Medications tried/Intolerance: Metformin- diarrhea  as well as hand and feet swelling. Ozempic - no  Currently checking blood sugars 4-5 x / day,  .  Hypoglycemia episodes : no                Hemoglobin A1c has ranged from 7.5% in 2022, peaking at 11.5% in 2013.  No prior hx of pancreatitis     On her initial visit to our clinic her A1c was 7.5%, we adjusted MDI regimen and started Jardiance.  Trulicity was continued  THYROID HISTORY: She has been diagnosed with hypothyroid years ago ~ 2013. No prior neck sx or radiation.   Denies FH of thyroid disease   SUBJECTIVE:   During the last visit (11/19/2021): A1c 6.7 %  Today (05/21/22): [name] is here for follow-up on diabetes management.she checks her blood sugars 1-3 times daily.    She is s/p left breast lumpectomy with radioactive seed localization 04/2022 with benign papilloma She is scheduled for right hip surgery/20 04/2022   Denies nausea, vomiting or diarrhea Dexcom is cost prohibitive   HOME ENDOCRINE REGIMEN: Lantus 10 units once daily  NovoLog 8 units TID QAC Ozempic 2 mg weekly Levothyroxine 150 mcg daily      Statin: Yes ACE-I/ARB: No Prior Diabetic Education: yes   METER DOWNLOAD SUMMARY: unable to download 87- 202 mg/dL   DIABETIC COMPLICATIONS: Microvascular complications:  Neuropathy  Denies: retinopathy, CKD  Last eye exam:  Completed 01/10/2022  Macrovascular complications:  CAD(status post PCI)  Denies: PVD, CVA   PAST HISTORY: Past Medical History:  Past Medical History:  Diagnosis Date   ALLERGIC RHINITIS    Allergy to IVP dye    Arthritis    Coronary artery disease    Diabetes mellitus, type 2    History of tobacco abuse    Quit 2011   Hypertension    Hypertriglyceridemia    Hypothyroidism    NSTEMI (non-ST elevated myocardial infarction) 09/30/2011   NO CAD by cath, ? coronary vasospasm   Obesity    Sleep apnea    Past Surgical History:  Past Surgical History:  Procedure Laterality Date   BREAST BIOPSY Left 03/05/2022   [place] LT BREAST BX W LOC DEV 1ST LESION IMG BX SPEC US GUIDE 03/05/2022 [facility]   BREAST BIOPSY  04/25/2022   MM LT RADIOACTIVE SEED LOC MAMMO GUIDE 04/25/2022 [facility]   BREAST LUMPECTOMY WITH RADIOACTIVE SEED LOCALIZATION Left 04/26/2022   Procedure: LEFT BREAST LUMPECTOMY WITH RADIOACTIVE SEED LOCALIZATION;  Surgeon: Griselda Miner, MD;  Location: [facility];  Service: General;  Laterality: Left;   CORONARY STENT INTERVENTION N/A 08/19/2017   Procedure: CORONARY STENT INTERVENTION;  Surgeon: Yates Decamp, MD;  Location: [facility];  Service: Cardiovascular;  Laterality: N/A;   KNEE ARTHROSCOPY W/ MENISCAL REPAIR Left 2009   KNEE ARTHROSCOPY W/ MENISCAL REPAIR Right 2014  LEFT HEART CATH AND CORONARY ANGIOGRAPHY N/A 08/19/2017   Procedure: LEFT HEART CATH AND CORONARY ANGIOGRAPHY;  Surgeon: Yates Decamp, MD;  Location: [facility];  Service: Cardiovascular;  Laterality: N/A;   LEFT HEART CATHETERIZATION WITH CORONARY ANGIOGRAM N/A 09/30/2011   Procedure: LEFT HEART CATHETERIZATION WITH CORONARY ANGIOGRAM;  Surgeon: Kathleene Hazel, MD;  Location: [facility] CATH LAB;  Service: Cardiovascular;  Laterality: N/A;   LUMBAR DISC SURGERY  11/27/2020   L4-5   TUBAL LIGATION     VESICOVAGINAL FISTULA CLOSURE W/ TAH      Social  History:  reports that she quit smoking about 11 years ago. Her smoking use included cigarettes. She has a 28.80 pack-year smoking history. She has never used smokeless tobacco. She reports that she does not currently use alcohol. She reports that she does not use drugs. Family History:  Family History  Problem Relation Age of Onset   Heart disease Mother        has pacemaker   Diabetes Mother    Hypertension Brother    Diabetes Brother    Hypertension Brother    Cancer Maternal Uncle    Diabetes Other        strong family history     HOME MEDICATIONS: Allergies as of 05/22/2022       Reactions   Contrast Media [iodinated Contrast Media] Swelling   Skin Peeling and break out   Codeine Itching   Other    NO PLASTIC TAPE - Blisters and skin removal        Medication List        Accurate as of May 21, 2022 11:40 AM. If you have any questions, ask your nurse or doctor.          amLODipine-valsartan 5-160 MG tablet Commonly known as: EXFORGE Take 1 tablet by mouth at bedtime.   aspirin EC 81 MG tablet Take 81 mg by mouth daily after breakfast.   B-D UF III MINI PEN NEEDLES 31G X 5 MM Misc Generic drug: Insulin Pen Needle 1 Device by Does not apply route in the morning, at noon, in the evening, and at bedtime.   cyanocobalamin 1000 MCG tablet Commonly known as: VITAMIN B12 Take 1,000 mcg by mouth in the morning.   docusate sodium 100 MG capsule Commonly known as: COLACE Take 100 mg by mouth daily after breakfast.   ibuprofen 200 MG tablet Commonly known as: ADVIL Take 200-400 mg by mouth every 8 (eight) hours as needed (pain.).   isosorbide mononitrate 30 MG 24 hr tablet Commonly known as: IMDUR Take 30 mg by mouth in the morning and at bedtime.   Lantus SoloStar 100 UNIT/ML Solostar Pen Generic drug: insulin glargine Inject 10 Units into the skin at bedtime.   levothyroxine 150 MCG tablet Commonly known as: SYNTHROID Take 1 tablet (150 mcg total) by  mouth daily before breakfast.   metoprolol succinate 50 MG 24 hr tablet Commonly known as: TOPROL-XL Take 50 mg by mouth daily after breakfast.   Mitigare 0.6 MG Caps Generic drug: Colchicine Take 0.6 mg by mouth daily after breakfast.   nitroGLYCERIN 0.4 MG SL tablet Commonly known as: NITROSTAT Place 1 tablet (0.4 mg total) under the tongue every 5 (five) minutes as needed for chest pain (up to 3 doses).   NovoLOG FlexPen 100 UNIT/ML FlexPen Generic drug: insulin aspart Max Daily 30 units What changed:  how much to take how to take this when to take this additional instructions  Ozempic (2 MG/DOSE) 8 MG/3ML Sopn Generic drug: Semaglutide (2 MG/DOSE) Inject 2 mg into the skin once a week. What changed: when to take this   rosuvastatin 40 MG tablet Commonly known as: CRESTOR Take 40 mg by mouth daily after breakfast.   traMADol 50 MG tablet Commonly known as: Ultram Take 1 tablet (50 mg total) by mouth every 6 (six) hours as needed.   traZODone 50 MG tablet Commonly known as: DESYREL Take 50 mg by mouth at bedtime as needed for sleep.         ALLERGIES: Allergies  Allergen Reactions   Contrast Media [Iodinated Contrast Media] Swelling    Skin Peeling and break out   Codeine Itching   Other     NO PLASTIC TAPE - Blisters and skin removal     REVIEW OF SYSTEMS: A comprehensive ROS was conducted with the patient and is negative except as per HPI and    OBJECTIVE:   VITAL SIGNS: There were no vitals taken for this visit.   PHYSICAL EXAM:  General: Pt appears well and is in NAD  Neck: General: Supple without adenopathy or carotid bruits. Thyroid: Thyroid size normal.  No goiter or nodules appreciated. No thyroid bruit.  Lungs: Clear with good BS bilat with no rales, rhonchi, or wheezes  Heart: RRR   Extremities:  Lower extremities - No pretibial edema. No lesions.  Neuro: MS is good with appropriate affect, pt is alert and Ox3   DM Foot Exam  07/23/2021   The skin of the feet is intact without sores or ulcerations. The pedal pulses are 2+ on right and 2+ on left. The sensation is intact to a screening 5.07, 10 gram monofilament bilaterally   DATA REVIEWED:  Lab Results  Component Value Date   HGBA1C 6.3 (H) 05/21/2022   HGBA1C 6.7 (A) 11/19/2021   HGBA1C 6.9 (A) 07/23/2021    Latest Reference Range & Units 05/21/22 10:25  Sodium 135 - 145 mmol/L 140  Potassium 3.5 - 5.1 mmol/L 3.9  Chloride 98 - 111 mmol/L 107  CO2 22 - 32 mmol/L 23  Glucose 70 - 99 mg/dL 161 (H)  Mean Plasma Glucose mg/dL 096.04  BUN 8 - 23 mg/dL 19  Creatinine 5.40 - 9.81 mg/dL 1.91 (H)  Calcium 8.9 - 10.3 mg/dL 9.2  Anion gap 5 - 15  10  Alkaline Phosphatase 38 - 126 U/L 61  Albumin 3.5 - 5.0 g/dL 3.7  AST 15 - 41 U/L 18  ALT 0 - 44 U/L 26  Total Protein 6.5 - 8.1 g/dL 7.4  Total Bilirubin 0.3 - 1.2 mg/dL 0.8  GFR, Estimated >47 mL/min 57 (L)  (H): Data is abnormally high (L): Data is abnormally low   Latest Reference Range & Units 03/16/21 09:34  Creatinine,U mg/dL 829.5  Microalb, Ur 0.0 - 1.9 mg/dL 1.0  MICROALB/CREAT RATIO 0.0 - 30.0 mg/g 0.9     ASSESSMENT / PLAN / RECOMMENDATIONS:   1) Type 2 Diabetes Mellitus, Optimally controlled, With Neuropathic and  macrovascular complications - Most recent A1c of 6.3%. Goal A1c <7.0%.    - Praised the pt on optimizing glucose control and weight loss  - Intolerant to Startex due to recurrent genital infections  - Dexcom cost prohibitive  -I will increase her Ozempic as below, and decrease prandial insulin as below   MEDICATIONS: Continue Lantus 10 units daily Decrease NovoLog to 8 units 3 times daily with each meal Increase Ozempic 2 mg mg weekly Continue  correction factor: NovoLog (BG -130/35)   EDUCATION / INSTRUCTIONS: BG monitoring instructions: Patient is instructed to check her blood sugars 3 times a day, fasting. Call [facility] if: BG persistently < 70   I reviewed the Rule of 15 for the treatment of hypoglycemia in detail with the patient. Literature supplied.   2) Diabetic complications:  Eye: Does not have known diabetic retinopathy.  Neuro/ Feet: Does  have known diabetic peripheral neuropathy. Renal: Patient does not have known baseline CKD, but her GFR fluctuates. She is not on an ACEI/ARB at present.  3) Hypothyroidism:  -TSH has been normal in the past on current dose - No change   Medication Continue levothyroxine 150 mcg daily   F/U in 6 months     Signed electronically by: Lyndle Herrlich, MD  [facility] Endocrinology  [facility] [address] [place], [place] [address] Phone: [phone] FAX: [phone]   CC: Jackie Plum, MD [address] [place] [address] Phone: [phone]  Fax: [phone]    Return to Endocrinology clinic as below: Future Appointments  Date Time Provider Department Center  05/22/2022  7:50 AM Darlen Gledhill, Konrad Dolores, MD [facility] None  06/10/2022  8:30 AM [name], MD [facility] None  09/24/2022  9:45 AM Tessa Lerner, DO [facility] None

## 2022-05-22 ENCOUNTER — Ambulatory Visit (INDEPENDENT_AMBULATORY_CARE_PROVIDER_SITE_OTHER): Payer: Medicare HMO | Admitting: Internal Medicine

## 2022-05-22 ENCOUNTER — Encounter: Payer: Self-pay | Admitting: Internal Medicine

## 2022-05-22 VITALS — BP 132/80 | HR 68 | Ht 68.0 in | Wt 258.0 lb

## 2022-05-22 DIAGNOSIS — Z794 Long term (current) use of insulin: Secondary | ICD-10-CM

## 2022-05-22 DIAGNOSIS — E1142 Type 2 diabetes mellitus with diabetic polyneuropathy: Secondary | ICD-10-CM

## 2022-05-22 DIAGNOSIS — E039 Hypothyroidism, unspecified: Secondary | ICD-10-CM

## 2022-05-22 DIAGNOSIS — E1159 Type 2 diabetes mellitus with other circulatory complications: Secondary | ICD-10-CM | POA: Diagnosis not present

## 2022-05-22 LAB — POCT GLUCOSE (DEVICE FOR HOME USE): Glucose Fasting, POC: 138 mg/dL — AB (ref 70–99)

## 2022-05-22 MED ORDER — OZEMPIC (2 MG/DOSE) 8 MG/3ML ~~LOC~~ SOPN: 2.0000 mg | PEN_INJECTOR | SUBCUTANEOUS | 3 refills | Status: DC

## 2022-05-22 MED ORDER — NOVOLOG FLEXPEN 100 UNIT/ML ~~LOC~~ SOPN: PEN_INJECTOR | SUBCUTANEOUS | 3 refills | Status: DC

## 2022-05-22 MED ORDER — BD PEN NEEDLE MINI U/F 31G X 5 MM MISC: 1.0000 | Freq: Four times a day (QID) | 3 refills | Status: DC

## 2022-05-22 MED ORDER — LANTUS SOLOSTAR 100 UNIT/ML ~~LOC~~ SOPN: 10.0000 [IU] | PEN_INJECTOR | Freq: Every day | SUBCUTANEOUS | 3 refills | Status: DC

## 2022-05-22 MED ORDER — LEVOTHYROXINE SODIUM 150 MCG PO TABS: 150.0000 ug | ORAL_TABLET | Freq: Every day | ORAL | 3 refills | Status: DC

## 2022-05-22 NOTE — Progress Notes (Addendum)
Case: [id number] Date/Time: 05/28/22 0715   Procedure: RIGHT TOTAL HIP ARTHROPLASTY ANTERIOR APPROACH (Right: Hip)   Anesthesia type: Choice   Pre-op diagnosis: right hip osteoarthritis   [facility]: [facility]OM 09 / [facility]   Surgeons: [name], MD       DISCUSSION: [name] is a 62 year old female who presents for surgery listed above on 05/28/2022.  Past medical history significant for insulin-dependent type 2 diabetes, history of breast cancer status post lumpectomy with radioactive seed placement on 04/26/2022, NSTEMI, CAD s/p PCI (2019), former smoker (quit 2013), OSA, obesity. No prior anesthesia complications. ASA 3  Patient last saw cardiology on 03/26/2022 prior to left breast lumpectomy.  She received preoperative cardiac clearance at that time by Dr. Odis Hollingshead:  "Planning to undergo left breast lumpectomy as well as right hip replacement. No anginal discomfort or heart failure symptoms. EKG nonischemic. Most recent echo and stress test results reviewed.  Stress test was low risk. With lifestyle changes and pharmacological therapy and increasing physical activity she has managed to lose 34 pounds. She walks at least 60 minutes/day. Her past history is significant for CAD with prior coronary interventions, and insulin-dependent diabetes. Patient is acceptable risk for upcoming noncardiac surgery.  She finds her risk stratification acceptable.  No additional testing warranted at this time."  The patient also is an insulin dependent diabetic and follows with endocrinology.  Diabetes is well-controlled on current regimen.  Her current A1c is 6.3 on 05/21/2022. Ozempic has been held since 05/14/22  For her breast cancer she will be followed by primary care with yearly mammograms..  Her OSA has resolved after weight loss.   VS: BP 128/63   Pulse (!) 59   Temp 36.8 C   Resp 17   Ht  (1.727 m)   Wt 116.5 kg   SpO2 100%   BMI 39.05 kg/m   PROVIDERS: PCP:  Jackie Plum, MD Cardiologist: Dr. Tessa Lerner  LABS: Labs reviewed: Acceptable for surgery. (all labs ordered are listed, but only abnormal results are displayed)  Labs Reviewed  GLUCOSE, CAPILLARY - Abnormal; Notable for the following components:      Result Value   Glucose-Capillary 126 (*)    All other components within normal limits  CBC - Abnormal; Notable for the following components:   HCT 46.2 (*)    All other components within normal limits  COMPREHENSIVE METABOLIC PANEL - Abnormal; Notable for the following components:   Glucose, Bld 130 (*)    Creatinine, Ser 1.11 (*)    GFR, Estimated 57 (*)    All other components within normal limits  HEMOGLOBIN A1C - Abnormal; Notable for the following components:   Hgb A1c MFr Bld 6.3 (*)    All other components within normal limits  SURGICAL PCR SCREEN  TYPE AND SCREEN     IMAGES: n/a   EKG 03/26/22:  NSR   CV:  Echo 06/08/2020:  Technically difficult study.  Left ventricle cavity is normal in size and wall thickness. Normal global  wall motion. Normal LV systolic function with visual EF 50-55%. Doppler  evidence of grade I (impaired) diastolic dysfunction, normal LAP.  No significant valvular abnormality.  Normal right atrial pressure.  No significant change compared to previous study in 2019.   Lexiscan Tetrofosmin stress test 06/07/2020: 1 Day Rest/Stress Protocol. Stress EKG is non-diagnostic, as this is pharmacological stress test using Lexiscan. No convincing evidence of reversible myocardial ischemia or prior infarct. Left ventricular wall thickness is  preserved without regional wall motion abnormalities. Calculated LVEF  51%, visually appears preserved. No prior studies available for comparison.  Coronary angiogram 08/19/2017: Normal LV systolic function, EF 55%, normal LVEDP. Left main nonexistent, separate ostia for LAD and circumflex.  LAD has mild diffuse disease.  Circumflex is codominant with  RCA and is more than normal. RCA is codominant with circumflex coronary artery.  Mid segment shows a ulcerated 80% stenosis S/P 3.0 x 20 mm Synergy, 80% to 0%, TIMI-3 to TIMI-3 flow maintained.   Recommend uninterrupted dual antiplatelet therapy with Aspirin 81mg  daily and Ticagrelor 90mg  twice daily for a minimum of 12 months (ACS - Class I recommendation).  Aggressive risk factor modification is also indicated.  Patient be discharged home in the morning.      Past Medical History:  Diagnosis Date   ALLERGIC RHINITIS    Allergy to IVP dye    Arthritis    Coronary artery disease    Diabetes mellitus, type 2    History of tobacco abuse    Quit 2011   Hypertension    Hypertriglyceridemia    Hypothyroidism    NSTEMI (non-ST elevated myocardial infarction) 09/30/2011   NO CAD by cath, ? coronary vasospasm   Obesity    Sleep apnea     Past Surgical History:  Procedure Laterality Date   BREAST BIOPSY Left 03/05/2022   [place] LT BREAST BX W LOC DEV 1ST LESION IMG BX SPEC US GUIDE 03/05/2022 [facility]   BREAST BIOPSY  04/25/2022   MM LT RADIOACTIVE SEED LOC MAMMO GUIDE 04/25/2022 [facility]   BREAST LUMPECTOMY WITH RADIOACTIVE SEED LOCALIZATION Left 04/26/2022   Procedure: LEFT BREAST LUMPECTOMY WITH RADIOACTIVE SEED LOCALIZATION;  Surgeon: Griselda Miner, MD;  Location: [facility];  Service: General;  Laterality: Left;   CORONARY STENT INTERVENTION N/A 08/19/2017   Procedure: CORONARY STENT INTERVENTION;  Surgeon: Yates Decamp, MD;  [facility];  Service: Cardiovascular;  Laterality: N/A;   KNEE ARTHROSCOPY W/ MENISCAL REPAIR Left 2009   KNEE ARTHROSCOPY W/ MENISCAL REPAIR Right 2014   LEFT HEART CATH AND CORONARY ANGIOGRAPHY N/A 08/19/2017   Procedure: LEFT HEART CATH AND CORONARY ANGIOGRAPHY;  Surgeon: Yates Decamp, MD;  [facility];  Service: Cardiovascular;  Laterality: N/A;   LEFT HEART CATHETERIZATION WITH CORONARY  ANGIOGRAM N/A 09/30/2011   Procedure: LEFT HEART CATHETERIZATION WITH CORONARY ANGIOGRAM;  Surgeon: Kathleene Hazel, MD;  Location: [facility] C[facility];  Service: Cardiovascular;  Laterality: N/A;   LUMBAR DISC SURGERY  11/27/2020   L4-5   TUBAL LIGATION     VESICOVAGINAL FISTULA CLOSURE W/ TAH      MEDICATIONS:  amLODipine-valsartan (EXFORGE) 5-160 MG tablet   aspirin EC 81 MG tablet   cyanocobalamin (VITAMIN B12) 1000 MCG tablet   docusate sodium (COLACE) 100 MG capsule   ibuprofen (ADVIL) 200 MG tablet   Insulin Pen Needle (B-D UF III MINI PEN NEEDLES) 31G X 5 MM MISC   isosorbide mononitrate (IMDUR) 30 MG 24 hr tablet   LANTUS SOLOSTAR 100 UNIT/ML Solostar Pen   levothyroxine (SYNTHROID) 150 MCG tablet   metoprolol succinate (TOPROL-XL) 50 MG 24 hr tablet   MITIGARE 0.6 MG CAPS   nitroGLYCERIN (NITROSTAT) 0.4 MG SL tablet   NOVOLOG FLEXPEN 100 UNIT/ML FlexPen   OZEMPIC, 2 MG/DOSE, 8 MG/3ML SOPN   rosuvastatin (CRESTOR) 40 MG tablet   traMADol (ULTRAM) 50 MG tablet   traZODone (DESYREL) 50 MG tablet   No  current facility-administered medications for this encounter.    Marcille Blanco [facility] Short Stay/Anesthesiology [facility] Phone [phone] 05/22/2022 11:57 AM

## 2022-05-22 NOTE — Patient Instructions (Signed)
-   Continue Lantus 10 daily - Continue  NOvolog 10 units with each meal  - Continue Ozempic 2 mg weekly  -Novolog correctional insulin: ADD extra units on insulin to your meal-time Novolog dose if your blood sugars are higher than 165. Use the scale below to help guide you:   Blood sugar before meal Number of units to inject  Less than 165 0 unit  166 -  200 1 units  201 -  235 2 units  236 -  270 3 units  271 -  305 4 units  306 -  340 5 units  341 -  375 6 units  376 -  410 7 units      HOW TO TREAT LOW BLOOD SUGARS (Blood sugar LESS THAN 70 MG/DL) Please follow the RULE OF 15 for the treatment of hypoglycemia treatment (when your (blood sugars are less than 70 mg/dL)   STEP 1: Take 15 grams of carbohydrates when your blood sugar is low, which includes:  3-4 GLUCOSE TABS  OR 3-4 OZ OF JUICE OR REGULAR SODA OR ONE TUBE OF GLUCOSE GEL    STEP 2: RECHECK blood sugar in 15 MINUTES STEP 3: If your blood sugar is still low at the 15 minute recheck --> then, go back to STEP 1 and treat AGAIN with another 15 grams of carbohydrates.

## 2022-05-22 NOTE — Anesthesia Preprocedure Evaluation (Addendum)
Anesthesia Evaluation  Patient identified by MRN, date of birth, ID band Patient awake    Reviewed: Allergy & Precautions, NPO status , Patient's Chart, lab work & pertinent test results  Airway Mallampati: II  TM Distance: >3 FB Neck ROM: Full    Dental  (+) Dental Advisory Given, Teeth Intact   Pulmonary sleep apnea , pneumonia, resolved, former smoker   Pulmonary exam normal breath sounds clear to auscultation       Cardiovascular hypertension, Pt. on medications and Pt. on home beta blockers + CAD, + Past MI, + Cardiac Stents and + DOE  Normal cardiovascular exam Rhythm:Regular Rate:Normal  Echocardiogram 06/08/2020: Technically difficult study. Left ventricle cavity is normal in size and wall thickness. Normal global wall motion. Normal LV systolic function with visual EF 50-55%. Doppler evidence of grade I (impaired) diastolic dysfunction, normal LAP. No significant valvular abnormality. Normal right atrial pressure. No significant change compared to previous study in 2019.  Lexiscan Tetrofosmin stress test 06/07/2020: 1 Day Rest/Stress Protocol. Stress EKG is non-diagnostic, as this is pharmacological stress test using Lexiscan. No convincing evidence of reversible myocardial ischemia or prior infarct. Left ventricular wall thickness is preserved without regional wall motion abnormalities. Calculated LVEF  51%, visually appears preserved. No prior studies available for comparison.    coronary vasospasm    Neuro/Psych negative neurological ROS  negative psych ROS   GI/Hepatic negative GI ROS, Neg liver ROS,,,  Endo/Other  diabetes, Insulin DependentHypothyroidism  Morbid obesityPatient on GLP-1 Agonist. Last dose 3.12.24  Renal/GU negative Renal ROS     Musculoskeletal  (+) Arthritis ,    Abdominal  (+) + obese  Peds  Hematology negative hematology ROS (+)   Anesthesia Other Findings LEFT BREAST  PAPILLOMA  Reproductive/Obstetrics                             Anesthesia Physical Anesthesia Plan  ASA: 3  Anesthesia Plan: General   Post-op Pain Management: Tylenol PO (pre-op)* and Gabapentin PO (pre-op)*   Induction: Intravenous, Rapid sequence and Cricoid pressure planned  PONV Risk Score and Plan: 3 and Ondansetron, Dexamethasone, Midazolam and Treatment may vary due to age or medical condition  Airway Management Planned: Oral ETT  Additional Equipment:   Intra-op Plan:   Post-operative Plan: Extubation in OR  Informed Consent: I have reviewed the patients History and Physical, chart, labs and discussed the procedure including the risks, benefits and alternatives for the proposed anesthesia with the patient or authorized representative who has indicated his/her understanding and acceptance.     Dental advisory given  Plan Discussed with: CRNA  Anesthesia Plan Comments: (Pt became nauseated and vomited in short stay prior to entering the OR. Plan to change from spinal to GETA. Discussed with patient.   See PAT note from 4/16)       Anesthesia Quick Evaluation

## 2022-05-27 ENCOUNTER — Telehealth: Payer: Self-pay | Admitting: *Deleted

## 2022-05-27 DIAGNOSIS — M1611 Unilateral primary osteoarthritis, right hip: Secondary | ICD-10-CM | POA: Insufficient documentation

## 2022-05-27 NOTE — Telephone Encounter (Signed)
Attempted pre-op calls x 3 to patient prior to surgery. Left VM requesting call back.

## 2022-05-27 NOTE — H&P (Signed)
TOTAL HIP ADMISSION H&P  Patient is admitted for right total hip arthroplasty.  Subjective:  Chief Complaint: right hip pain  HPI: [name], 62 y.o. female, has a history of pain and functional disability in the right hip(s) due to arthritis and patient has failed non-surgical conservative treatments for greater than 12 weeks to include NSAID's and/or analgesics, corticosteriod injections, flexibility and strengthening excercises, supervised PT with diminished ADL's post treatment, use of assistive devices, weight reduction as appropriate, and activity modification.  Onset of symptoms was gradual starting 3 years ago with gradually worsening course since that time.The patient noted no past surgery on the right hip(s).  Patient currently rates pain in the right hip at 10 out of 10 with activity. Patient has night pain, worsening of pain with activity and weight bearing, trendelenberg gait, pain that interfers with activities of daily living, and pain with passive range of motion. Patient has evidence of subchondral sclerosis, periarticular osteophytes, and joint space narrowing by imaging studies. This condition presents safety issues increasing the risk of falls.  There is no current active infection.  Patient Active Problem List   Diagnosis Date Noted   Unilateral primary osteoarthritis, right hip 05/27/2022   Diabetes mellitus 03/16/2021   Spondylolisthesis, lumbar region 11/27/2020   Spinal stenosis, lumbar region with neurogenic claudication 11/27/2020   Fusion of spine of lumbar region 11/27/2020   Severe obstructive sleep apnea 11/09/2018   Healthcare maintenance 11/09/2018   At risk for obstructive sleep apnea 04/16/2018   Acquired hypothyroidism 03/17/2018   DOE (dyspnea on exertion) 03/16/2018   NSTEMI (non-ST elevated myocardial infarction) 08/18/2017   Right sided sciatica 07/10/2017   Right low back pain 07/10/2017   Chest pain 09/29/2011   Morbid obesity (HCC)  09/29/2011   DM type 2 (diabetes mellitus, type 2) 09/29/2011   HTN (hypertension) 09/29/2011   Pneumonia, bacterial 08/07/2010   Incidental lung nodule, > 3mm and < 8mm 08/07/2010   Bronchitis 08/07/2010   Past Medical History:  Diagnosis Date   ALLERGIC RHINITIS    Allergy to IVP dye    Arthritis    Coronary artery disease    Diabetes mellitus, type 2    History of tobacco abuse    Quit 2011   Hypertension    Hypertriglyceridemia    Hypothyroidism    NSTEMI (non-ST elevated myocardial infarction) 09/30/2011   NO CAD by cath, ? coronary vasospasm   Obesity    Sleep apnea     Past Surgical History:  Procedure Laterality Date   BREAST BIOPSY Left 03/05/2022   [place] LT BREAST BX W LOC DEV 1ST LESION IMG BX SPEC US GUIDE 03/05/2022 [facility]   BREAST BIOPSY  04/25/2022   MM LT RADIOACTIVE SEED LOC MAMMO GUIDE 04/25/2022 [facility]   BREAST LUMPECTOMY WITH RADIOACTIVE SEED LOCALIZATION Left 04/26/2022   Procedure: LEFT BREAST LUMPECTOMY WITH RADIOACTIVE SEED LOCALIZATION;  Surgeon: Griselda Miner, MD;  Location: [facility];  Service: General;  Laterality: Left;   CORONARY STENT INTERVENTION N/A 08/19/2017   Procedure: CORONARY STENT INTERVENTION;  Surgeon: Yates Decamp, MD;  [facility];  Service: Cardiovascular;  Laterality: N/A;   KNEE ARTHROSCOPY W/ MENISCAL REPAIR Left 2009   KNEE ARTHROSCOPY W/ MENISCAL REPAIR Right 2014   LEFT HEART CATH AND CORONARY ANGIOGRAPHY N/A 08/19/2017   Procedure: LEFT HEART CATH AND CORONARY ANGIOGRAPHY;  Surgeon: Yates Decamp, MD;  [facility];  Service: Cardiovascular;  Laterality: N/A;  LEFT HEART CATHETERIZATION WITH CORONARY ANGIOGRAM N/A 09/30/2011   Procedure: LEFT HEART CATHETERIZATION WITH CORONARY ANGIOGRAM;  Surgeon: Kathleene Hazel, MD;  Location: [facility] CATH LAB;  Service: Cardiovascular;  Laterality: N/A;   LUMBAR DISC SURGERY  11/27/2020   L4-5   TUBAL LIGATION      VESICOVAGINAL FISTULA CLOSURE W/ TAH      No current facility-administered medications for this encounter.   Current Outpatient Medications  Medication Sig Dispense Refill Last Dose   amLODipine-valsartan (EXFORGE) 5-160 MG tablet Take 1 tablet by mouth at bedtime.      aspirin EC 81 MG tablet Take 81 mg by mouth daily after breakfast.      cyanocobalamin (VITAMIN B12) 1000 MCG tablet Take 1,000 mcg by mouth in the morning.      docusate sodium (COLACE) 100 MG capsule Take 100 mg by mouth daily after breakfast.      isosorbide mononitrate (IMDUR) 30 MG 24 hr tablet Take 30 mg by mouth in the morning and at bedtime.      metoprolol succinate (TOPROL-XL) 50 MG 24 hr tablet Take 50 mg by mouth daily after breakfast.      MITIGARE 0.6 MG CAPS Take 0.6 mg by mouth daily after breakfast.      nitroGLYCERIN (NITROSTAT) 0.4 MG SL tablet Place 1 tablet (0.4 mg total) under the tongue every 5 (five) minutes as needed for chest pain (up to 3 doses). 25 tablet 3    rosuvastatin (CRESTOR) 40 MG tablet Take 40 mg by mouth daily after breakfast.      traZODone (DESYREL) 50 MG tablet Take 50 mg by mouth at bedtime as needed for sleep.      ibuprofen (ADVIL) 200 MG tablet Take 200-400 mg by mouth every 8 (eight) hours as needed (pain.).      Insulin Pen Needle (B-D UF III MINI PEN NEEDLES) 31G X 5 MM MISC 1 Device by Does not apply route in the morning, at noon, in the evening, and at bedtime. 400 each 3    LANTUS SOLOSTAR 100 UNIT/ML Solostar Pen Inject 10 Units into the skin at bedtime. 15 mL 3    levothyroxine (SYNTHROID) 150 MCG tablet Take 1 tablet (150 mcg total) by mouth daily before breakfast. 90 tablet 3    NOVOLOG FLEXPEN 100 UNIT/ML FlexPen Max Daily 30 units 30 mL 3    OZEMPIC, 2 MG/DOSE, 8 MG/3ML SOPN Inject 2 mg into the skin once a week. 9 mL 3    traMADol (ULTRAM) 50 MG tablet Take 1 tablet (50 mg total) by mouth every 6 (six) hours as needed. 15 tablet 0 Not Taking   Allergies  Allergen  Reactions   Contrast Media [Iodinated Contrast Media] Swelling    Skin Peeling and break out   Codeine Itching   Other     NO PLASTIC TAPE - Blisters and skin removal    Social History   Tobacco Use   Smoking status: Former    Packs/day: 0.80    Years: 36.00    Additional pack years: 0.00    Total pack years: 28.80    Types: Cigarettes    Quit date: 02/05/2011    Years since quitting: 11.3   Smokeless tobacco: Never  Substance Use Topics   Alcohol use: Not Currently    Comment: VERY RARE    Family History  Problem Relation Age of Onset   Heart disease Mother        has pacemaker  Diabetes Mother    Hypertension Brother    Diabetes Brother    Hypertension Brother    Cancer Maternal Uncle    Diabetes Other        strong family history     Review of Systems  Objective:  Physical Exam Vitals reviewed.  Constitutional:      Appearance: Normal appearance. She is obese.  HENT:     Head: Normocephalic and atraumatic.  Eyes:     Extraocular Movements: Extraocular movements intact.     Pupils: Pupils are equal, round, and reactive to light.  Cardiovascular:     Rate and Rhythm: Normal rate and regular rhythm.     Pulses: Normal pulses.  Pulmonary:     Effort: Pulmonary effort is normal.     Breath sounds: Normal breath sounds.  Abdominal:     Palpations: Abdomen is soft.  Musculoskeletal:     Cervical back: Normal range of motion and neck supple.     Right hip: Tenderness and bony tenderness present. Decreased range of motion. Decreased strength.  Neurological:     Mental Status: She is alert and oriented to person, place, and time.  Psychiatric:        Behavior: Behavior normal.     Vital signs in last 24 hours:    Labs:   Estimated body mass index is 39.23 kg/m as calculated from the following:   Height as of 05/22/22: 5\' 8"  (1.727 m).   Weight as of 05/22/22: 117 kg.   Imaging Review Plain radiographs demonstrate severe degenerative joint disease  of the right hip(s). The bone quality appears to be good for age and reported activity level.      Assessment/Plan:  End stage arthritis, right hip(s)  The patient history, physical examination, clinical judgement of the provider and imaging studies are consistent with end stage degenerative joint disease of the right hip(s) and total hip arthroplasty is deemed medically necessary. The treatment options including medical management, injection therapy, arthroscopy and arthroplasty were discussed at length. The risks and benefits of total hip arthroplasty were presented and reviewed. The risks due to aseptic loosening, infection, stiffness, dislocation/subluxation,  thromboembolic complications and other imponderables were discussed.  The patient acknowledged the explanation, agreed to proceed with the plan and consent was signed. Patient is being admitted for inpatient treatment for surgery, pain control, PT, OT, prophylactic antibiotics, VTE prophylaxis, progressive ambulation and ADL's and discharge planning.The patient is planning to be discharged home with home health services

## 2022-05-28 ENCOUNTER — Encounter (HOSPITAL_COMMUNITY): Payer: Self-pay | Admitting: Orthopaedic Surgery

## 2022-05-28 ENCOUNTER — Observation Stay (HOSPITAL_COMMUNITY)
Admission: RE | Admit: 2022-05-28 | Discharge: 2022-05-29 | Disposition: A | Discharge status: Home Health | Payer: Medicare HMO | Source: Ambulatory Visit | Attending: Orthopaedic Surgery | Admitting: Orthopaedic Surgery

## 2022-05-28 ENCOUNTER — Ambulatory Visit (HOSPITAL_COMMUNITY): Payer: Medicare HMO

## 2022-05-28 ENCOUNTER — Encounter (HOSPITAL_COMMUNITY)
Admission: RE | Disposition: A | Discharge status: Home Health | Payer: Self-pay | Source: Ambulatory Visit | Attending: Orthopaedic Surgery

## 2022-05-28 ENCOUNTER — Observation Stay (HOSPITAL_COMMUNITY): Payer: Medicare HMO

## 2022-05-28 ENCOUNTER — Other Ambulatory Visit: Payer: Self-pay

## 2022-05-28 ENCOUNTER — Ambulatory Visit (HOSPITAL_BASED_OUTPATIENT_CLINIC_OR_DEPARTMENT_OTHER): Payer: Medicare HMO | Admitting: Anesthesiology

## 2022-05-28 ENCOUNTER — Ambulatory Visit (HOSPITAL_COMMUNITY): Payer: Medicare HMO | Admitting: Medical

## 2022-05-28 DIAGNOSIS — E119 Type 2 diabetes mellitus without complications: Secondary | ICD-10-CM | POA: Diagnosis not present

## 2022-05-28 DIAGNOSIS — Z87891 Personal history of nicotine dependence: Secondary | ICD-10-CM

## 2022-05-28 DIAGNOSIS — Z471 Aftercare following joint replacement surgery: Secondary | ICD-10-CM | POA: Diagnosis not present

## 2022-05-28 DIAGNOSIS — M1611 Unilateral primary osteoarthritis, right hip: Secondary | ICD-10-CM

## 2022-05-28 DIAGNOSIS — Z79899 Other long term (current) drug therapy: Secondary | ICD-10-CM | POA: Insufficient documentation

## 2022-05-28 DIAGNOSIS — Z96641 Presence of right artificial hip joint: Secondary | ICD-10-CM

## 2022-05-28 DIAGNOSIS — I10 Essential (primary) hypertension: Secondary | ICD-10-CM | POA: Insufficient documentation

## 2022-05-28 DIAGNOSIS — Z955 Presence of coronary angioplasty implant and graft: Secondary | ICD-10-CM | POA: Insufficient documentation

## 2022-05-28 DIAGNOSIS — I2510 Atherosclerotic heart disease of native coronary artery without angina pectoris: Secondary | ICD-10-CM

## 2022-05-28 DIAGNOSIS — Z794 Long term (current) use of insulin: Secondary | ICD-10-CM | POA: Insufficient documentation

## 2022-05-28 DIAGNOSIS — E039 Hypothyroidism, unspecified: Secondary | ICD-10-CM | POA: Diagnosis not present

## 2022-05-28 DIAGNOSIS — I252 Old myocardial infarction: Secondary | ICD-10-CM | POA: Diagnosis not present

## 2022-05-28 DIAGNOSIS — Z7982 Long term (current) use of aspirin: Secondary | ICD-10-CM | POA: Insufficient documentation

## 2022-05-28 HISTORY — PX: TOTAL HIP ARTHROPLASTY: SHX124

## 2022-05-28 LAB — GLUCOSE, CAPILLARY
Glucose-Capillary: 108 mg/dL — ABNORMAL HIGH (ref 70–99)
Glucose-Capillary: 113 mg/dL — ABNORMAL HIGH (ref 70–99)
Glucose-Capillary: 128 mg/dL — ABNORMAL HIGH (ref 70–99)
Glucose-Capillary: 164 mg/dL — ABNORMAL HIGH (ref 70–99)
Glucose-Capillary: 175 mg/dL — ABNORMAL HIGH (ref 70–99)

## 2022-05-28 SURGERY — ARTHROPLASTY, HIP, TOTAL, ANTERIOR APPROACH
Anesthesia: Spinal | Site: Hip | Laterality: Right

## 2022-05-28 MED ORDER — FENTANYL CITRATE (PF) 250 MCG/5ML IJ SOLN
INTRAMUSCULAR | Status: AC
  Filled 2022-05-28: qty 5

## 2022-05-28 MED ORDER — DEXMEDETOMIDINE HCL IN NACL 80 MCG/20ML IV SOLN
INTRAVENOUS | Status: DC | PRN
  Administered 2022-05-28 (×2): 8 ug via BUCCAL

## 2022-05-28 MED ORDER — ONDANSETRON HCL 4 MG/2ML IJ SOLN: 4.0000 mg | Freq: Once | INTRAMUSCULAR | Status: AC

## 2022-05-28 MED ORDER — HYDROMORPHONE HCL 1 MG/ML IJ SOLN
INTRAMUSCULAR | Status: AC
  Filled 2022-05-28: qty 0.5

## 2022-05-28 MED ORDER — ROCURONIUM BROMIDE 10 MG/ML (PF) SYRINGE
PREFILLED_SYRINGE | INTRAVENOUS | Status: DC | PRN
  Administered 2022-05-28: 40 mg via INTRAVENOUS

## 2022-05-28 MED ORDER — MIDAZOLAM HCL 2 MG/2ML IJ SOLN
INTRAMUSCULAR | Status: AC
  Filled 2022-05-28: qty 2

## 2022-05-28 MED ORDER — LACTATED RINGERS IV SOLN: INTRAVENOUS | Status: DC

## 2022-05-28 MED ORDER — INSULIN ASPART 100 UNIT/ML IJ SOLN: 0.0000 [IU] | Freq: Three times a day (TID) | INTRAMUSCULAR | Status: DC

## 2022-05-28 MED ORDER — MENTHOL 3 MG MT LOZG: 1.0000 | LOZENGE | OROMUCOSAL | Status: DC | PRN

## 2022-05-28 MED ORDER — ONDANSETRON HCL 4 MG PO TABS: 4.0000 mg | ORAL_TABLET | Freq: Four times a day (QID) | ORAL | Status: DC | PRN

## 2022-05-28 MED ORDER — ACETAMINOPHEN 325 MG PO TABS: 325.0000 mg | ORAL_TABLET | Freq: Four times a day (QID) | ORAL | Status: DC | PRN

## 2022-05-28 MED ORDER — SODIUM CHLORIDE 0.9 % IV SOLN: INTRAVENOUS | Status: DC

## 2022-05-28 MED ORDER — COLCHICINE 0.6 MG PO TABS
0.6000 mg | ORAL_TABLET | Freq: Every day | ORAL | Status: DC
  Filled 2022-05-28: qty 1

## 2022-05-28 MED ORDER — PROMETHAZINE HCL 25 MG/ML IJ SOLN: 6.2500 mg | INTRAMUSCULAR | Status: DC | PRN

## 2022-05-28 MED ORDER — SUCCINYLCHOLINE CHLORIDE 200 MG/10ML IV SOSY
PREFILLED_SYRINGE | INTRAVENOUS | Status: DC | PRN
  Administered 2022-05-28: 110 mg via INTRAVENOUS

## 2022-05-28 MED ORDER — METOPROLOL SUCCINATE ER 50 MG PO TB24: 50.0000 mg | ORAL_TABLET | Freq: Every day | ORAL | Status: DC

## 2022-05-28 MED ORDER — METHOCARBAMOL 1000 MG/10ML IJ SOLN: 500.0000 mg | Freq: Four times a day (QID) | INTRAVENOUS | Status: DC | PRN

## 2022-05-28 MED ORDER — MEPERIDINE HCL 25 MG/ML IJ SOLN: 6.2500 mg | INTRAMUSCULAR | Status: DC | PRN

## 2022-05-28 MED ORDER — METHOCARBAMOL 500 MG PO TABS
500.0000 mg | ORAL_TABLET | Freq: Four times a day (QID) | ORAL | Status: DC | PRN
  Administered 2022-05-28 – 2022-05-29 (×2): 500 mg via ORAL
  Filled 2022-05-28 (×3): qty 1

## 2022-05-28 MED ORDER — GLYCOPYRROLATE PF 0.2 MG/ML IJ SOSY
PREFILLED_SYRINGE | INTRAMUSCULAR | Status: DC | PRN
  Administered 2022-05-28: .2 mg via INTRAVENOUS

## 2022-05-28 MED ORDER — LEVOTHYROXINE SODIUM 75 MCG PO TABS
150.0000 ug | ORAL_TABLET | Freq: Every day | ORAL | Status: DC
  Administered 2022-05-29: 150 ug via ORAL
  Filled 2022-05-28: qty 2

## 2022-05-28 MED ORDER — IRBESARTAN 150 MG PO TABS
150.0000 mg | ORAL_TABLET | Freq: Every day | ORAL | Status: DC
  Administered 2022-05-28: 150 mg via ORAL
  Filled 2022-05-28: qty 1

## 2022-05-28 MED ORDER — PANTOPRAZOLE SODIUM 40 MG PO TBEC
40.0000 mg | DELAYED_RELEASE_TABLET | Freq: Every day | ORAL | Status: DC
  Administered 2022-05-28: 40 mg via ORAL
  Filled 2022-05-28: qty 1

## 2022-05-28 MED ORDER — SUGAMMADEX SODIUM 200 MG/2ML IV SOLN
INTRAVENOUS | Status: DC | PRN
  Administered 2022-05-28: 232.2 mg via INTRAVENOUS

## 2022-05-28 MED ORDER — TRAZODONE HCL 50 MG PO TABS
50.0000 mg | ORAL_TABLET | Freq: Every evening | ORAL | Status: DC | PRN
  Filled 2022-05-28: qty 1

## 2022-05-28 MED ORDER — ROSUVASTATIN CALCIUM 20 MG PO TABS: 40.0000 mg | ORAL_TABLET | Freq: Every day | ORAL | Status: DC

## 2022-05-28 MED ORDER — OXYCODONE HCL 5 MG PO TABS
10.0000 mg | ORAL_TABLET | ORAL | Status: DC | PRN
  Administered 2022-05-28 – 2022-05-29 (×3): 10 mg via ORAL
  Filled 2022-05-28: qty 2

## 2022-05-28 MED ORDER — DOCUSATE SODIUM 100 MG PO CAPS
100.0000 mg | ORAL_CAPSULE | Freq: Two times a day (BID) | ORAL | Status: DC
  Administered 2022-05-28 (×2): 100 mg via ORAL
  Filled 2022-05-28 (×2): qty 1

## 2022-05-28 MED ORDER — ORAL CARE MOUTH RINSE: 15.0000 mL | Freq: Once | OROMUCOSAL | Status: AC

## 2022-05-28 MED ORDER — INSULIN ASPART 100 UNIT/ML IJ SOLN: 0.0000 [IU] | INTRAMUSCULAR | Status: DC | PRN

## 2022-05-28 MED ORDER — GABAPENTIN 300 MG PO CAPS
300.0000 mg | ORAL_CAPSULE | Freq: Once | ORAL | Status: AC
  Administered 2022-05-28: 300 mg via ORAL
  Filled 2022-05-28: qty 1

## 2022-05-28 MED ORDER — ISOSORBIDE MONONITRATE ER 60 MG PO TB24
30.0000 mg | ORAL_TABLET | Freq: Two times a day (BID) | ORAL | Status: DC
  Administered 2022-05-28 (×2): 30 mg via ORAL
  Filled 2022-05-28 (×2): qty 1

## 2022-05-28 MED ORDER — PROPOFOL 10 MG/ML IV BOLUS
INTRAVENOUS | Status: DC | PRN
  Administered 2022-05-28: 160 mg via INTRAVENOUS
  Administered 2022-05-28: 150 ug/kg/min via INTRAVENOUS

## 2022-05-28 MED ORDER — SODIUM CHLORIDE 0.9 % IR SOLN
Status: DC | PRN
  Administered 2022-05-28: 1000 mL

## 2022-05-28 MED ORDER — PHENYLEPHRINE 80 MCG/ML (10ML) SYRINGE FOR IV PUSH (FOR BLOOD PRESSURE SUPPORT)
PREFILLED_SYRINGE | INTRAVENOUS | Status: AC
  Filled 2022-05-28: qty 10

## 2022-05-28 MED ORDER — PHENYLEPHRINE 80 MCG/ML (10ML) SYRINGE FOR IV PUSH (FOR BLOOD PRESSURE SUPPORT)
PREFILLED_SYRINGE | INTRAVENOUS | Status: DC | PRN
  Administered 2022-05-28 (×4): 80 ug via INTRAVENOUS

## 2022-05-28 MED ORDER — CEFAZOLIN SODIUM-DEXTROSE 2-4 GM/100ML-% IV SOLN
2.0000 g | Freq: Four times a day (QID) | INTRAVENOUS | Status: AC
  Administered 2022-05-28 (×2): 2 g via INTRAVENOUS
  Filled 2022-05-28 (×2): qty 100

## 2022-05-28 MED ORDER — VITAMIN B-12 1000 MCG PO TABS
1000.0000 ug | ORAL_TABLET | Freq: Every morning | ORAL | Status: DC
  Administered 2022-05-29: 1000 ug via ORAL
  Filled 2022-05-28 (×2): qty 1

## 2022-05-28 MED ORDER — POVIDONE-IODINE 10 % EX SWAB: 2.0000 | Freq: Once | CUTANEOUS | Status: DC

## 2022-05-28 MED ORDER — LIDOCAINE 2% (20 MG/ML) 5 ML SYRINGE
INTRAMUSCULAR | Status: DC | PRN
  Administered 2022-05-28: 100 mg via INTRAVENOUS

## 2022-05-28 MED ORDER — ACETAMINOPHEN 500 MG PO TABS
1000.0000 mg | ORAL_TABLET | Freq: Once | ORAL | Status: AC
  Administered 2022-05-28: 1000 mg via ORAL
  Filled 2022-05-28: qty 2

## 2022-05-28 MED ORDER — NITROGLYCERIN 0.4 MG SL SUBL: 0.4000 mg | SUBLINGUAL_TABLET | SUBLINGUAL | Status: DC | PRN

## 2022-05-28 MED ORDER — FENTANYL CITRATE (PF) 250 MCG/5ML IJ SOLN
INTRAMUSCULAR | Status: DC | PRN
  Administered 2022-05-28: 50 ug via INTRAVENOUS
  Administered 2022-05-28: 100 ug via INTRAVENOUS
  Administered 2022-05-28 (×2): 50 ug via INTRAVENOUS

## 2022-05-28 MED ORDER — 0.9 % SODIUM CHLORIDE (POUR BTL) OPTIME
TOPICAL | Status: DC | PRN
  Administered 2022-05-28: 1000 mL

## 2022-05-28 MED ORDER — ONDANSETRON HCL 4 MG/2ML IJ SOLN
INTRAMUSCULAR | Status: DC | PRN
  Administered 2022-05-28: 4 mg via INTRAVENOUS

## 2022-05-28 MED ORDER — AMLODIPINE BESYLATE-VALSARTAN 5-160 MG PO TABS: 1.0000 | ORAL_TABLET | Freq: Every day | ORAL | Status: DC

## 2022-05-28 MED ORDER — DEXAMETHASONE SODIUM PHOSPHATE 10 MG/ML IJ SOLN
INTRAMUSCULAR | Status: DC | PRN
  Administered 2022-05-28: 10 mg via INTRAVENOUS

## 2022-05-28 MED ORDER — DIPHENHYDRAMINE HCL 12.5 MG/5ML PO ELIX: 12.5000 mg | ORAL_SOLUTION | ORAL | Status: DC | PRN

## 2022-05-28 MED ORDER — ONDANSETRON HCL 4 MG/2ML IJ SOLN
INTRAMUSCULAR | Status: AC
  Administered 2022-05-28: 4 mg via INTRAVENOUS
  Filled 2022-05-28: qty 2

## 2022-05-28 MED ORDER — HYDROMORPHONE HCL 1 MG/ML IJ SOLN
INTRAMUSCULAR | Status: AC
  Filled 2022-05-28: qty 1

## 2022-05-28 MED ORDER — HYDROMORPHONE HCL 1 MG/ML IJ SOLN
INTRAMUSCULAR | Status: DC | PRN
  Administered 2022-05-28 (×2): .25 mg via INTRAVENOUS

## 2022-05-28 MED ORDER — TRANEXAMIC ACID-NACL 1000-0.7 MG/100ML-% IV SOLN
1000.0000 mg | INTRAVENOUS | Status: AC
  Administered 2022-05-28: 1000 mg via INTRAVENOUS
  Filled 2022-05-28: qty 100

## 2022-05-28 MED ORDER — PHENOL 1.4 % MT LIQD: 1.0000 | OROMUCOSAL | Status: DC | PRN

## 2022-05-28 MED ORDER — ASPIRIN 81 MG PO CHEW
81.0000 mg | CHEWABLE_TABLET | Freq: Two times a day (BID) | ORAL | Status: DC
  Administered 2022-05-28: 81 mg via ORAL
  Filled 2022-05-28: qty 1

## 2022-05-28 MED ORDER — OXYCODONE HCL 5 MG PO TABS
ORAL_TABLET | ORAL | Status: AC
  Filled 2022-05-28: qty 2

## 2022-05-28 MED ORDER — POLYETHYLENE GLYCOL 3350 17 G PO PACK: 17.0000 g | PACK | Freq: Every day | ORAL | Status: DC | PRN

## 2022-05-28 MED ORDER — AMLODIPINE BESYLATE 5 MG PO TABS
5.0000 mg | ORAL_TABLET | Freq: Every day | ORAL | Status: DC
  Administered 2022-05-28: 5 mg via ORAL
  Filled 2022-05-28: qty 1

## 2022-05-28 MED ORDER — HYDROMORPHONE HCL 1 MG/ML IJ SOLN: 0.5000 mg | INTRAMUSCULAR | Status: DC | PRN

## 2022-05-28 MED ORDER — INSULIN GLARGINE-YFGN 100 UNIT/ML ~~LOC~~ SOLN
10.0000 [IU] | Freq: Every day | SUBCUTANEOUS | Status: DC
  Filled 2022-05-28 (×2): qty 0.1

## 2022-05-28 MED ORDER — ALBUTEROL SULFATE HFA 108 (90 BASE) MCG/ACT IN AERS
INHALATION_SPRAY | RESPIRATORY_TRACT | Status: DC | PRN
  Administered 2022-05-28 (×2): 2 via RESPIRATORY_TRACT

## 2022-05-28 MED ORDER — METOCLOPRAMIDE HCL 5 MG PO TABS: 5.0000 mg | ORAL_TABLET | Freq: Three times a day (TID) | ORAL | Status: DC | PRN

## 2022-05-28 MED ORDER — ALUM & MAG HYDROXIDE-SIMETH 200-200-20 MG/5ML PO SUSP: 30.0000 mL | ORAL | Status: DC | PRN

## 2022-05-28 MED ORDER — CEFAZOLIN IN SODIUM CHLORIDE 3-0.9 GM/100ML-% IV SOLN
3.0000 g | INTRAVENOUS | Status: AC
  Administered 2022-05-28: 3 g via INTRAVENOUS
  Filled 2022-05-28: qty 100

## 2022-05-28 MED ORDER — AMISULPRIDE (ANTIEMETIC) 5 MG/2ML IV SOLN: 10.0000 mg | Freq: Once | INTRAVENOUS | Status: DC | PRN

## 2022-05-28 MED ORDER — METOCLOPRAMIDE HCL 5 MG/ML IJ SOLN: 5.0000 mg | Freq: Three times a day (TID) | INTRAMUSCULAR | Status: DC | PRN

## 2022-05-28 MED ORDER — ONDANSETRON HCL 4 MG/2ML IJ SOLN: 4.0000 mg | Freq: Four times a day (QID) | INTRAMUSCULAR | Status: DC | PRN

## 2022-05-28 MED ORDER — CHLORHEXIDINE GLUCONATE 0.12 % MT SOLN
15.0000 mL | Freq: Once | OROMUCOSAL | Status: AC
  Administered 2022-05-28: 15 mL via OROMUCOSAL
  Filled 2022-05-28: qty 15

## 2022-05-28 MED ORDER — HYDROMORPHONE HCL 1 MG/ML IJ SOLN
0.2500 mg | INTRAMUSCULAR | Status: DC | PRN
  Administered 2022-05-28 (×2): 0.25 mg via INTRAVENOUS
  Administered 2022-05-28: 0.5 mg via INTRAVENOUS

## 2022-05-28 MED ORDER — OXYCODONE HCL 5 MG PO TABS
5.0000 mg | ORAL_TABLET | ORAL | Status: DC | PRN
  Administered 2022-05-28: 5 mg via ORAL
  Administered 2022-05-28: 10 mg via ORAL
  Filled 2022-05-28 (×3): qty 2

## 2022-05-28 SURGICAL SUPPLY — 56 items
APL SKNCLS STERI-STRIP NONHPOA (GAUZE/BANDAGES/DRESSINGS) ×1
BAG COUNTER SPONGE SURGICOUNT (BAG) ×1 IMPLANT
BAG SPNG CNTER NS LX DISP (BAG) ×1
BENZOIN TINCTURE PRP APPL 2/3 (GAUZE/BANDAGES/DRESSINGS) ×1 IMPLANT
BLADE CLIPPER SURG (BLADE) IMPLANT
BLADE SAW SGTL 18X1.27X75 (BLADE) ×1 IMPLANT
COVER SURGICAL LIGHT HANDLE (MISCELLANEOUS) ×1 IMPLANT
CUP SECTOR GRIPTON 50MM (Cup) IMPLANT
DRAPE C-ARM 42X72 X-RAY (DRAPES) ×1 IMPLANT
DRAPE STERI IOBAN 125X83 (DRAPES) ×1 IMPLANT
DRAPE U-SHAPE 47X51 STRL (DRAPES) ×3 IMPLANT
DRSG AQUACEL AG ADV 3.5X10 (GAUZE/BANDAGES/DRESSINGS) ×1 IMPLANT
DURAPREP 26ML APPLICATOR (WOUND CARE) ×1 IMPLANT
ELECT BLADE 4.0 EZ CLEAN MEGAD (MISCELLANEOUS) ×1
ELECT BLADE 6.5 EXT (BLADE) IMPLANT
ELECT REM PT RETURN 9FT ADLT (ELECTROSURGICAL) ×1
ELECTRODE BLDE 4.0 EZ CLN MEGD (MISCELLANEOUS) ×1 IMPLANT
ELECTRODE REM PT RTRN 9FT ADLT (ELECTROSURGICAL) ×1 IMPLANT
FACESHIELD WRAPAROUND (MASK) ×2 IMPLANT
FACESHIELD WRAPAROUND OR TEAM (MASK) ×2 IMPLANT
FEM STEM 12/14 TAPER SZ 4 HIP (Orthopedic Implant) ×1 IMPLANT
FEMORAL STEM 12/14 TPR SZ4 HIP (Orthopedic Implant) IMPLANT
GLOVE BIOGEL PI IND STRL 8 (GLOVE) ×2 IMPLANT
GLOVE ECLIPSE 8.0 STRL XLNG CF (GLOVE) ×1 IMPLANT
GLOVE ORTHO TXT STRL SZ7.5 (GLOVE) ×2 IMPLANT
GOWN STRL REUS W/ TWL LRG LVL3 (GOWN DISPOSABLE) ×2 IMPLANT
GOWN STRL REUS W/ TWL XL LVL3 (GOWN DISPOSABLE) ×2 IMPLANT
GOWN STRL REUS W/TWL LRG LVL3 (GOWN DISPOSABLE) ×2
GOWN STRL REUS W/TWL XL LVL3 (GOWN DISPOSABLE) ×2
HANDPIECE INTERPULSE COAX TIP (DISPOSABLE) ×1
HEAD FEMORAL 32 CERAMIC (Hips) IMPLANT
KIT BASIN OR (CUSTOM PROCEDURE TRAY) ×1 IMPLANT
KIT TURNOVER KIT B (KITS) ×1 IMPLANT
LINER ACET PNNCL PLUS4 NEUTRAL (Hips) IMPLANT
MANIFOLD NEPTUNE II (INSTRUMENTS) ×1 IMPLANT
NS IRRIG 1000ML POUR BTL (IV SOLUTION) ×1 IMPLANT
PACK TOTAL JOINT (CUSTOM PROCEDURE TRAY) ×1 IMPLANT
PAD ARMBOARD 7.5X6 YLW CONV (MISCELLANEOUS) ×1 IMPLANT
PINNACLE PLUS 4 NEUTRAL (Hips) ×1 IMPLANT
SET HNDPC FAN SPRY TIP SCT (DISPOSABLE) ×1 IMPLANT
STAPLER VISISTAT 35W (STAPLE) IMPLANT
STRIP CLOSURE SKIN 1/2X4 (GAUZE/BANDAGES/DRESSINGS) ×2 IMPLANT
SUT ETHIBOND NAB CT1 #1 30IN (SUTURE) ×1 IMPLANT
SUT MNCRL AB 4-0 PS2 18 (SUTURE) IMPLANT
SUT VIC AB 0 CT1 27 (SUTURE) ×1
SUT VIC AB 0 CT1 27XBRD ANBCTR (SUTURE) ×1 IMPLANT
SUT VIC AB 1 CT1 27 (SUTURE) ×1
SUT VIC AB 1 CT1 27XBRD ANBCTR (SUTURE) ×1 IMPLANT
SUT VIC AB 2-0 CT1 27 (SUTURE) ×1
SUT VIC AB 2-0 CT1 TAPERPNT 27 (SUTURE) ×1 IMPLANT
TOWEL GREEN STERILE (TOWEL DISPOSABLE) ×1 IMPLANT
TOWEL GREEN STERILE FF (TOWEL DISPOSABLE) ×1 IMPLANT
TRAY CATH INTERMITTENT SS 16FR (CATHETERS) IMPLANT
TRAY FOLEY W/BAG SLVR 16FR (SET/KITS/TRAYS/PACK)
TRAY FOLEY W/BAG SLVR 16FR ST (SET/KITS/TRAYS/PACK) IMPLANT
WATER STERILE IRR 1000ML POUR (IV SOLUTION) ×2 IMPLANT

## 2022-05-28 NOTE — Transfer of Care (Signed)
Immediate Anesthesia Transfer of Care Note  Patient: [name]  Procedure(s) Performed: RIGHT TOTAL HIP ARTHROPLASTY ANTERIOR APPROACH (Right: Hip)  Patient Location: PACU  Anesthesia Type:General  Level of Consciousness: awake  Airway & Oxygen Therapy: Patient Spontanous Breathing and Patient connected to face mask oxygen  Post-op Assessment: Report given to RN and Post -op Vital signs reviewed and stable  Post vital signs: Reviewed and stable  Last Vitals:  Vitals Value Taken Time  BP 104/65 05/28/22 0923  Temp 36.3 C 05/28/22 0923  Pulse 77 05/28/22 0926  Resp 23 05/28/22 0926  SpO2 93 % 05/28/22 0926  Vitals shown include unvalidated device data.  Last Pain:  Vitals:   05/28/22 0625  TempSrc:   PainSc: 0-No pain         Complications: No notable events documented.

## 2022-05-28 NOTE — Anesthesia Procedure Notes (Signed)
Procedure Name: Intubation Date/Time: 05/28/2022 7:40 AM  Performed by: Loleta Ragena Fiola, CRNAPre-anesthesia Checklist: Patient identified, Patient being monitored, Timeout performed, Emergency Drugs available and Suction available Patient Re-evaluated:Patient Re-evaluated prior to induction Oxygen Delivery Method: Circle system utilized Preoxygenation: Pre-oxygenation with 100% oxygen Induction Type: IV induction Ventilation: Mask ventilation without difficulty Laryngoscope Size: Mac and 3 Grade View: Grade II Tube type: Oral Tube size: 7.5 mm Number of attempts: 1 Airway Equipment and Method: Stylet Placement Confirmation: ETT inserted through vocal cords under direct vision, positive ETCO2 and breath sounds checked- equal and bilateral Secured at: 21 cm Tube secured with: Tape Dental Injury: Teeth and Oropharynx as per pre-operative assessment

## 2022-05-28 NOTE — Anesthesia Postprocedure Evaluation (Signed)
Anesthesia Post Note  Patient: [name]  Procedure(s) Performed: RIGHT TOTAL HIP ARTHROPLASTY ANTERIOR APPROACH (Right: Hip)     Patient location during evaluation: PACU Anesthesia Type: General Level of consciousness: sedated and patient cooperative Pain management: pain level controlled Vital Signs Assessment: post-procedure vital signs reviewed and stable Respiratory status: spontaneous breathing Cardiovascular status: stable Anesthetic complications: no   No notable events documented.  Last Vitals:  Vitals:   05/28/22 1425 05/28/22 2024  BP: (!) 155/90 128/60  Pulse: 66 75  Resp: 18 20  Temp: 36.4 C 37.1 C  SpO2: 98% 98%    Last Pain:  Vitals:   05/28/22 2024  TempSrc: Oral  PainSc:                  Lewie Loron

## 2022-05-28 NOTE — Progress Notes (Signed)
Patient ID: [name], female   DOB: May 13, 1960, 62 y.o.   MRN: [id number] Patient comfortable post op. Dressing clean dry and intact right hip. Dorsi/plantar flexion right ankle intact.

## 2022-05-28 NOTE — Evaluation (Signed)
Physical Therapy Evaluation Patient Details Name: [name] MRN: [id number] DOB: May 10, 1960 Today's Date: 05/28/2022  History of Present Illness  62 y.o. female presents to [facility] on 05/28/2022 for elective R THA. PMH includes DM, spinal stenosis, NSTEMI, HTN.  Clinical Impression  Pt presents to PT with deficits in functional mobility, gait, balance, strength, power, endurance. Pt is able to ambulate well with support of RW, requiring some assistance to mobilize RLE at bed level initially but progressing well during session. PT encourages frequent ambulation with staff assistance. PT provides education on surgical hip exercise packet. PT will follow up tomorrow morning for further gait and stair training.       Recommendations for follow up therapy are one component of a multi-disciplinary discharge planning process, led by the attending physician.  Recommendations may be updated based on patient status, additional functional criteria and insurance authorization.  Follow Up Recommendations       Assistance Recommended at Discharge PRN  Patient can return home with the following  A little help with bathing/dressing/bathroom;Assistance with cooking/housework;Assist for transportation;Help with stairs or ramp for entrance    Equipment Recommendations Rolling walker (2 wheels)  Recommendations for Other Services       Functional Status Assessment Patient has had a recent decline in their functional status and demonstrates the ability to make significant improvements in function in a reasonable and predictable amount of time.     Precautions / Restrictions Precautions Precautions: Fall (direct anterior) Restrictions Weight Bearing Restrictions: Yes RLE Weight Bearing: Weight bearing as tolerated      Mobility  Bed Mobility Overal bed mobility: Needs Assistance Bed Mobility: Supine to Sit     Supine to sit: Min assist     General bed mobility comments: assist for  RLE    Transfers Overall transfer level: Needs assistance Equipment used: Rolling walker (2 wheels) Transfers: Sit to/from Stand Sit to Stand: Min guard                Ambulation/Gait Ambulation/Gait assistance: Supervision Gait Distance (Feet): 80 Feet Assistive device: Rolling walker (2 wheels) Gait Pattern/deviations: Step-through pattern Gait velocity: reduced Gait velocity interpretation: <1.8 ft/sec, indicate of risk for recurrent falls   General Gait Details: slowed step-through gait, reduced stance time on RLE  Stairs            Wheelchair Mobility    Modified Rankin (Stroke Patients Only)       Balance Overall balance assessment: Needs assistance Sitting-balance support: No upper extremity supported, Feet supported Sitting balance-Leahy Scale: Good     Standing balance support: Single extremity supported, Reliant on assistive device for balance Standing balance-Leahy Scale: Poor                               Pertinent Vitals/Pain Pain Assessment Pain Assessment: 0-10 Pain Score: 5  Pain Location: R hip Pain Descriptors / Indicators: Sore Pain Intervention(s): Monitored during session    Home Living Family/patient expects to be discharged to:: Private residence Living Arrangements: Children Available Help at Discharge: Family Type of Home: House Home Access: Stairs to enter Entrance Stairs-Rails: Can reach both Entrance Stairs-Number of Steps: 2   Home Layout: One level Home Equipment: Rollator (4 wheels);Cane - quad;Adaptive equipment;Shower seat;BSC/3in1      Prior Function Prior Level of Function : Independent/Modified Independent;Driving             Mobility Comments: ambulates with quad cane  in the home, rollator for longer distances ADLs Comments: intermittent assistance with cooking and IADLs     Hand Dominance   Dominant Hand: Right    Extremity/Trunk Assessment   Upper Extremity Assessment Upper  Extremity Assessment: Overall WFL for tasks assessed    Lower Extremity Assessment Lower Extremity Assessment: RLE deficits/detail RLE Deficits / Details: post-op weakness as expected s/p THA    Cervical / Trunk Assessment Cervical / Trunk Assessment: Normal  Communication   Communication: No difficulties  Cognition Arousal/Alertness: Awake/alert Behavior During Therapy: WFL for tasks assessed/performed Overall Cognitive Status: Within Functional Limits for tasks assessed                                          General Comments General comments (skin integrity, edema, etc.): VSS on RA    Exercises Other Exercises Other Exercises: PT provides demonstration and verbal education on surgical hip exercises   Assessment/Plan    PT Assessment Patient needs continued PT services  PT Problem List Decreased strength;Decreased activity tolerance;Decreased balance;Decreased mobility;Pain       PT Treatment Interventions DME instruction;Gait training;Stair training;Functional mobility training;Therapeutic activities;Therapeutic exercise;Balance training;Neuromuscular re-education;Patient/family education    PT Goals (Current goals can be found in the Care Plan section)  Acute Rehab PT Goals Patient Stated Goal: to return to independence, keep up with grandkids PT Goal Formulation: With patient Time For Goal Achievement: 06/01/22 Potential to Achieve Goals: Good    Frequency 7X/week     Co-evaluation               AM-PAC PT "6 Clicks" Mobility  Outcome Measure Help needed turning from your back to your side while in a flat bed without using bedrails?: A Little Help needed moving from lying on your back to sitting on the side of a flat bed without using bedrails?: A Little Help needed moving to and from a bed to a chair (including a wheelchair)?: A Little Help needed standing up from a chair using your arms (e.g., wheelchair or bedside chair)?: A  Little Help needed to walk in hospital room?: A Little Help needed climbing 3-5 steps with a railing? : A Lot 6 Click Score: 17    End of Session   Activity Tolerance: Patient tolerated treatment well Patient left: in bed;with call bell/phone within reach;with family/visitor present Nurse Communication: Mobility status PT Visit Diagnosis: Other abnormalities of gait and mobility (R26.89);Muscle weakness (generalized) (M62.81);Pain Pain - Right/Left: Right Pain - part of body: Hip    Time: 4540-9811 PT Time Calculation (min) (ACUTE ONLY): 19 min   Charges:   PT Evaluation $PT Eval Low Complexity: 1 Low          Arlyss Gandy, PT, DPT Acute Rehabilitation Office [phone]   Arlyss Gandy 05/28/2022, 4:13 PM

## 2022-05-28 NOTE — Interval H&P Note (Signed)
History and Physical Interval Note: The patient understands that she is here today for a right total hip replacement to treat her severe right hip arthritis.  There has been no acute or interval change in her medical status.  See H&P.  The risks and benefits of surgery have been explained in detail and informed consent was obtained.  The right operative hip has been marked.  05/28/2022 7:26 AM  [name]  has presented today for surgery, with the diagnosis of right hip osteoarthritis.  The various methods of treatment have been discussed with the patient and family. After consideration of risks, benefits and other options for treatment, the patient has consented to  Procedure(s): RIGHT TOTAL HIP ARTHROPLASTY ANTERIOR APPROACH (Right) as a surgical intervention.  The patient's history has been reviewed, patient examined, no change in status, stable for surgery.  I have reviewed the patient's chart and labs.  Questions were answered to the patient's satisfaction.     [name]

## 2022-05-28 NOTE — Op Note (Signed)
Operative Note  Date of operation: 05/28/2022 Preoperative diagnosis: Right hip primary osteoarthritis Postoperative diagnosis: Same  Procedure: Right direct anterior total hip arthroplasty  Implants: Implant Name Type Inv. Item Serial No. Manufacturer Lot No. LRB No. Used Action  CUP SECTOR GRIPTON - [id number] Cup CUP SECTOR GRIPTON  [employer]PU[employer] [id number] Right 1 Implanted  PINNACLE PLUS 4 NEUTRAL - [id number] Hips PINNACLE PLUS 4 NEUTRAL  DEPUY ORTHOPAEDICS [id number] Right 1 Implanted  HEAD FEMORAL 32 CERAMIC - [id number] Hips HEAD FEMORAL 32 CERAMIC  DEPUY [employer]TH[employer] [id number] Right 1 Implanted  FEM STEM 12/14 TAPER SZ 4 HIP - [id number] Orthopedic Implant FEM STEM 12/14 TAPER SZ 4 HIP  DEPUY ORTH[employer] [id number] Right 1 Implanted   Surgeon: Vanita Panda. Magnus Ivan, MD Assistant: Rexene Edison, PA-C  Anesthesia: General EBL: 200 cc Antibiotics: 3 g IV Ancef Complications: None  Indications: The patient is a 62 year old female with debilitating arthritis involving her right hip.  This is been well-documented with clinical exam and x-ray findings.  At this point her right hip pain is daily and it is detrimentally affecting her mobility, her quality of life, and her actives daily living and she wished to proceed with a hip replacement.  She is someone who is morbidly obese with a high BMI but it is under 40.  She understands that with her weight there is certainly heightened risk of acute blood loss anemia, nerve vessel injury, fracture, infection, dislocation, DVT, implant failure, wound healing issues and leg length differences.  She understands her goals are hopefully decrease pain, improve mobility, and improve quality of life.  Procedure description: After informed consent was obtained and the appropriate right hip was marked, the patient was brought to the operating room and general anesthesia was obtained while she is on a stretcher because she got nauseated and almost  vomited in the short stay area so they wanted to make sure that there was good control of her airway.  She understands this as well.  Once general anesthesia was obtained traction boots were placed on both her feet and she was placed supine on the Hana fracture table with a perineal post in place in both legs and inline skeletal traction vices but no traction applied.  Her right operative hip was assessed radiographically.  He was then prepped and draped in DuraPrep and sterile drapes.  A timeout was called and she identified scared patient correct right hip.  An incision was then made just inferior and posterior to the ASIS and carried slightly obliquely down the leg.  Dissection was carried down to the tensor fascia lata muscle and tensor fascia was then divided longitudinally to proceed with a return to broach the hip.  Circumflex vessels were identified and cauterized and the hip capsule was identified and opened up in L-type format.  Cobra retractors were placed around the medial and lateral femoral neck and a femoral neck cut was made with an oscillating saw just proximal to the lesser trochanter and completed with an osteotome.  A corkscrew guide was placed in the femoral head and the femoral head was removed in its entirety and there was a wide area devoid of cartilage.  A bent Hohmann was then placed over the medial acetabular rim and remnants of the acetabular labrum and other debris removed.  Reaming was then initiated from a size 43 reamer and stepwise increments going up to a size 49 reamer with all reamers placed under direct visualization and the last reamer also placed under  direct fluoroscopy in order to obtain the depth of reaming, the inclination and anteversion.  The real DePuy sector GRIPTION acetabular opponent size 50 was then placed without difficulty followed by a 32+4 polythene liner.  Attention was then turned to the femur.  With the right leg externally rotated to 120 degrees, extended  and adducted, a Mueller retractor was placed medially and a Hohmann retractors placed behind the greater trochanter.  The lateral joint capsule was released and a box cutting osteotome was used in her femoral canal.  Broaching was then initiated from a size 0 broach using the Actis broaching system going up to a size 4 broach.  With a size 4 broach in place we trialed a standard offset femoral neck and a 32+1 trial hip ball.  Based on radiographic and clinical assessment we are pleased with leg length and stability but we felt like we needed more offset.  We then dislocated the hip and remove the trial components.  We placed the real Actis femoral component size 4 but 1 with high offset.  And we went with the real 32+1 ceramic head ball.  Again this was reduced in the acetabulum and we are pleased with range of motion, offset, leg length and stability assessed mechanically and radiographically.  The soft tissue was then irrigated with normal saline solution.  Some remnants of the joint capsule were closed with interrupted #1 Ethibond suture followed by #1 Vicryl to close the tensor fascia.  0 Vicryl is used to close the deep tissue and 2-0 Vicryl used to close subcutaneous tissue.  The skin was closed with staples.  An Aquacel dressing was applied.  The patient was taken off the Hana table, awakened, extubated and taken recovery in stable addition.  Rexene Edison, PA-C did assist during the entire case from beginning to end and his assistance was medically necessary and crucial for soft tissue management and retraction, helping guide implant placement and a layered closure of the wound.

## 2022-05-29 ENCOUNTER — Encounter (HOSPITAL_COMMUNITY): Payer: Self-pay | Admitting: Orthopaedic Surgery

## 2022-05-29 DIAGNOSIS — M1611 Unilateral primary osteoarthritis, right hip: Secondary | ICD-10-CM | POA: Diagnosis not present

## 2022-05-29 DIAGNOSIS — Z955 Presence of coronary angioplasty implant and graft: Secondary | ICD-10-CM | POA: Diagnosis not present

## 2022-05-29 DIAGNOSIS — E119 Type 2 diabetes mellitus without complications: Secondary | ICD-10-CM | POA: Diagnosis not present

## 2022-05-29 DIAGNOSIS — I2510 Atherosclerotic heart disease of native coronary artery without angina pectoris: Secondary | ICD-10-CM | POA: Diagnosis not present

## 2022-05-29 DIAGNOSIS — E039 Hypothyroidism, unspecified: Secondary | ICD-10-CM | POA: Diagnosis not present

## 2022-05-29 DIAGNOSIS — I10 Essential (primary) hypertension: Secondary | ICD-10-CM | POA: Diagnosis not present

## 2022-05-29 DIAGNOSIS — Z79899 Other long term (current) drug therapy: Secondary | ICD-10-CM | POA: Diagnosis not present

## 2022-05-29 DIAGNOSIS — Z7982 Long term (current) use of aspirin: Secondary | ICD-10-CM | POA: Diagnosis not present

## 2022-05-29 DIAGNOSIS — Z794 Long term (current) use of insulin: Secondary | ICD-10-CM | POA: Diagnosis not present

## 2022-05-29 LAB — CBC
HCT: 34.4 % — ABNORMAL LOW (ref 36.0–46.0)
Hemoglobin: 11.4 g/dL — ABNORMAL LOW (ref 12.0–15.0)
MCH: 29.2 pg (ref 26.0–34.0)
MCHC: 33.1 g/dL (ref 30.0–36.0)
MCV: 88.2 fL (ref 80.0–100.0)
Platelets: 215 10*3/uL (ref 150–400)
RBC: 3.9 MIL/uL (ref 3.87–5.11)
RDW: 13.7 % (ref 11.5–15.5)
WBC: 14.5 10*3/uL — ABNORMAL HIGH (ref 4.0–10.5)
nRBC: 0 % (ref 0.0–0.2)

## 2022-05-29 LAB — BASIC METABOLIC PANEL
Anion gap: 10 (ref 5–15)
BUN: 13 mg/dL (ref 8–23)
CO2: 23 mmol/L (ref 22–32)
Calcium: 8.6 mg/dL — ABNORMAL LOW (ref 8.9–10.3)
Chloride: 104 mmol/L (ref 98–111)
Creatinine, Ser: 1 mg/dL (ref 0.44–1.00)
GFR, Estimated: >60 mL/min (ref >60)
Glucose, Bld: 127 mg/dL — ABNORMAL HIGH (ref 70–99)
Potassium: 3.5 mmol/L (ref 3.5–5.1)
Sodium: 137 mmol/L (ref 135–145)

## 2022-05-29 LAB — GLUCOSE, CAPILLARY
Glucose-Capillary: 148 mg/dL — ABNORMAL HIGH (ref 70–99)
Glucose-Capillary: 119 mg/dL — ABNORMAL HIGH (ref 70–99)

## 2022-05-29 MED ORDER — METHOCARBAMOL 500 MG PO TABS: 500.0000 mg | ORAL_TABLET | Freq: Four times a day (QID) | ORAL | 1 refills | Status: DC | PRN

## 2022-05-29 MED ORDER — OXYCODONE HCL 5 MG PO TABS: 5.0000 mg | ORAL_TABLET | ORAL | 0 refills | Status: DC | PRN

## 2022-05-29 MED ORDER — ASPIRIN 81 MG PO CHEW: 81.0000 mg | CHEWABLE_TABLET | Freq: Two times a day (BID) | ORAL | 0 refills | Status: AC

## 2022-05-29 NOTE — Progress Notes (Signed)
Subjective: 1 Day Post-Op Procedure(s) (LRB): RIGHT TOTAL HIP ARTHROPLASTY ANTERIOR APPROACH (Right) Patient reports pain as mild.   No complaints.  Objective: Vital signs in last 24 hours: Temp:  [97.3 F (36.3 C)-99 F (37.2 C)] 98.1 F (36.7 C) (04/24 0752) Pulse Rate:  [55-76] 73 (04/24 0752) Resp:  [11-20] 20 (04/24 0752) BP: (104-155)/(58-90) 124/59 (04/24 0752) SpO2:  [92 %-100 %] 98 % (04/24 0752)  Intake/Output from previous day: 04/23 0701 - 04/24 0700 In: 3151.3 [P.O.:720; I.V.:2231.3; IV Piggyback:200] Out: 200 [Blood:200] Intake/Output this shift: No intake/output data recorded.  Recent Labs    05/29/22 0652  HGB 11.4*   Recent Labs    05/29/22 0652  WBC 14.5*  RBC 3.90  HCT 34.4*  PLT 215   No results for input(s): "NA", "K", "CL", "CO2", "BUN", "CREATININE", "GLUCOSE", "CALCIUM" in the last 72 hours. No results for input(s): "LABPT", "INR" in the last 72 hours.  Sensation intact distally Dorsiflexion/Plantar flexion intact Incision: dressing C/D/I Compartment soft   Assessment/Plan: 1 Day Post-Op Procedure(s) (LRB): RIGHT TOTAL HIP ARTHROPLASTY ANTERIOR APPROACH (Right) Up with therapy Discharge home with home health      Richardean Canal 05/29/2022, 7:55 AM

## 2022-05-29 NOTE — Discharge Instructions (Signed)
Dental Antibiotics:  In most cases prophylactic antibiotics for Dental procdeures after total joint surgery are not necessary.  Exceptions are as follows:  1. History of prior total joint infection  2. Severely immunocompromised (Organ Transplant, cancer chemotherapy, Rheumatoid biologic meds such as Humera)  3. Poorly controlled diabetes (A1C &gt; 8.0, blood glucose over 200)  If you have one of these conditions, contact your surgeon for an antibiotic prescription, prior to your dental procedure. INSTRUCTIONS AFTER JOINT REPLACEMENT   Remove items at home which could result in a fall. This includes throw rugs or furniture in walking pathways ICE to the affected joint every three hours while awake for 30 minutes at a time, for at least the first 3-5 days, and then as needed for pain and swelling.  Continue to use ice for pain and swelling. You may notice swelling that will progress down to the foot and ankle.  This is normal after surgery.  Elevate your leg when you are not up walking on it.   Continue to use the breathing machine you got in the hospital (incentive spirometer) which will help keep your temperature down.  It is common for your temperature to cycle up and down following surgery, especially at night when you are not up moving around and exerting yourself.  The breathing machine keeps your lungs expanded and your temperature down.   DIET:  As you were doing prior to hospitalization, we recommend a well-balanced diet.  DRESSING / WOUND CARE / SHOWERING  Keep the surgical dressing until follow up.  The dressing is water proof, so you can shower without any extra covering.  IF THE DRESSING FALLS OFF or the wound gets wet inside, change the dressing with sterile gauze.  Please use good hand washing techniques before changing the dressing.  Do not use any lotions or creams on the incision until instructed by your surgeon.    ACTIVITY  Increase activity slowly as tolerated, but  follow the weight bearing instructions below.   No driving for 6 weeks or until further direction given by your physician.  You cannot drive while taking narcotics.  No lifting or carrying greater than 10 lbs. until further directed by your surgeon. Avoid periods of inactivity such as sitting longer than an hour when not asleep. This helps prevent blood clots.  You may return to work once you are authorized by your doctor.     WEIGHT BEARING   Weight bearing as tolerated with assist device (walker, cane, etc) as directed, use it as long as suggested by your surgeon or therapist, typically at least 4-6 weeks.   EXERCISES  Results after joint replacement surgery are often greatly improved when you follow the exercise, range of motion and muscle strengthening exercises prescribed by your doctor. Safety measures are also important to protect the joint from further injury. Any time any of these exercises cause you to have increased pain or swelling, decrease what you are doing until you are comfortable again and then slowly increase them. If you have problems or questions, call your caregiver or physical therapist for advice.   Rehabilitation is important following a joint replacement. After just a few days of immobilization, the muscles of the leg can become weakened and shrink (atrophy).  These exercises are designed to build up the tone and strength of the thigh and leg muscles and to improve motion. Often times heat used for twenty to thirty minutes before working out will loosen up your tissues and help with   improving the range of motion but do not use heat for the first two weeks following surgery (sometimes heat can increase post-operative swelling).   These exercises can be done on a training (exercise) mat, on the floor, on a table or on a bed. Use whatever works the best and is most comfortable for you.    Use music or television while you are exercising so that the exercises are a pleasant  break in your day. This will make your life better with the exercises acting as a break in your routine that you can look forward to.   Perform all exercises about fifteen times, three times per day or as directed.  You should exercise both the operative leg and the other leg as well.  Exercises include:   Quad Sets - Tighten up the muscle on the front of the thigh (Quad) and hold for 5-10 seconds.   Straight Leg Raises - With your knee straight (if you were given a brace, keep it on), lift the leg to 60 degrees, hold for 3 seconds, and slowly lower the leg.  Perform this exercise against resistance later as your leg gets stronger.  Leg Slides: Lying on your back, slowly slide your foot toward your buttocks, bending your knee up off the floor (only go as far as is comfortable). Then slowly slide your foot back down until your leg is flat on the floor again.  Angel Wings: Lying on your back spread your legs to the side as far apart as you can without causing discomfort.  Hamstring Strength:  Lying on your back, push your heel against the floor with your leg straight by tightening up the muscles of your buttocks.  Repeat, but this time bend your knee to a comfortable angle, and push your heel against the floor.  You may put a pillow under the heel to make it more comfortable if necessary.   A rehabilitation program following joint replacement surgery can speed recovery and prevent re-injury in the future due to weakened muscles. Contact your doctor or a physical therapist for more information on knee rehabilitation.    CONSTIPATION  Constipation is defined medically as fewer than three stools per week and severe constipation as less than one stool per week.  Even if you have a regular bowel pattern at home, your normal regimen is likely to be disrupted due to multiple reasons following surgery.  Combination of anesthesia, postoperative narcotics, change in appetite and fluid intake all can affect your  bowels.   YOU MUST use at least one of the following options; they are listed in order of increasing strength to get the job done.  They are all available over the counter, and you may need to use some, POSSIBLY even all of these options:    Drink plenty of fluids (prune juice may be helpful) and high fiber foods Colace 100 mg by mouth twice a day  Senokot for constipation as directed and as needed Dulcolax (bisacodyl), take with full glass of water  Miralax (polyethylene glycol) once or twice a day as needed.  If you have tried all these things and are unable to have a bowel movement in the first 3-4 days after surgery call either your surgeon or your primary doctor.    If you experience loose stools or diarrhea, hold the medications until you stool forms back up.  If your symptoms do not get better within 1 week or if they get worse, check with your doctor.    If you experience "the worst abdominal pain ever" or develop nausea or vomiting, please contact the office immediately for further recommendations for treatment.   ITCHING:  If you experience itching with your medications, try taking only a single pain pill, or even half a pain pill at a time.  You can also use Benadryl over the counter for itching or also to help with sleep.   TED HOSE STOCKINGS:  Use stockings on both legs until for at least 2 weeks or as directed by physician office. They may be removed at night for sleeping.  MEDICATIONS:  See your medication summary on the "After Visit Summary" that nursing will review with you.  You may have some home medications which will be placed on hold until you complete the course of blood thinner medication.  It is important for you to complete the blood thinner medication as prescribed.  PRECAUTIONS:  If you experience chest pain or shortness of breath - call 911 immediately for transfer to the hospital emergency department.   If you develop a fever greater that 101 F, purulent drainage  from wound, increased redness or drainage from wound, foul odor from the wound/dressing, or calf pain - CONTACT YOUR SURGEON.                                                   FOLLOW-UP APPOINTMENTS:  If you do not already have a post-op appointment, please call the office for an appointment to be seen by your surgeon.  Guidelines for how soon to be seen are listed in your "After Visit Summary", but are typically between 1-4 weeks after surgery.  OTHER INSTRUCTIONS:   Knee Replacement:  Do not place pillow under knee, focus on keeping the knee straight while resting. CPM instructions: 0-90 degrees, 2 hours in the morning, 2 hours in the afternoon, and 2 hours in the evening. Place foam block, curve side up under heel at all times except when in CPM or when walking.  DO NOT modify, tear, cut, or change the foam block in any way.  POST-OPERATIVE OPIOID TAPER INSTRUCTIONS: It is important to wean off of your opioid medication as soon as possible. If you do not need pain medication after your surgery it is ok to stop day one. Opioids include: Codeine, Hydrocodone(Norco, Vicodin), Oxycodone(Percocet, oxycontin) and hydromorphone amongst others.  Long term and even short term use of opiods can cause: Increased pain response Dependence Constipation Depression Respiratory depression And more.  Withdrawal symptoms can include Flu like symptoms Nausea, vomiting And more Techniques to manage these symptoms Hydrate well Eat regular healthy meals Stay active Use relaxation techniques(deep breathing, meditating, yoga) Do Not substitute Alcohol to help with tapering If you have been on opioids for less than two weeks and do not have pain than it is ok to stop all together.  Plan to wean off of opioids This plan should start within one week post op of your joint replacement. Maintain the same interval or time between taking each dose and first decrease the dose.  Cut the total daily intake of  opioids by one tablet each day Next start to increase the time between doses. The last dose that should be eliminated is the evening dose.   MAKE SURE YOU:  Understand these instructions.  Get help right away if you are not doing   well or get worse.    Thank you for letting us be a part of your medical care team.  It is a privilege we respect greatly.  We hope these instructions will help you stay on track for a fast and full recovery!       

## 2022-05-29 NOTE — Plan of Care (Signed)
Pt given D/C instructions with verbal understanding. Rx's were sent to the pharmacy by MD. Pt's incision is clean and dry with no sign of infection. Pt's IV was removed prior to D/C. Pt D/C'd home via wheelchair per MD order. Pt is stable @ D/C and has no other needs at this time. Devyn Sheerin, RN  

## 2022-05-29 NOTE — Discharge Summary (Signed)
Patient ID: [name] MRN: [id number] DOB/AGE: Mar 25, 1960 62 y.o.  Admit date: 05/28/2022 Discharge date: 05/29/2022  Admission Diagnoses:  Principal Problem:   Unilateral primary osteoarthritis, right hip Active Problems:   Status post total replacement of right hip   Discharge Diagnoses:  Same  Past Medical History:  Diagnosis Date   ALLERGIC RHINITIS    Allergy to IVP dye    Arthritis    Coronary artery disease    Diabetes mellitus, type 2    History of tobacco abuse    Quit 2011   Hypertension    Hypertriglyceridemia    Hypothyroidism    NSTEMI (non-ST elevated myocardial infarction) 09/30/2011   NO CAD by cath, ? coronary vasospasm   Obesity    Sleep apnea     Surgeries: Procedure(s): RIGHT TOTAL HIP ARTHROPLASTY ANTERIOR APPROACH on 05/28/2022   Consultants:   Discharged Condition: Improved  Hospital Course: [name] is an 62 y.o. female who was admitted 05/28/2022 for operative treatment ofUnilateral primary osteoarthritis, right hip. Patient has severe unremitting pain that affects sleep, daily activities, and work/hobbies. After pre-op clearance the patient was taken to the operating room on 05/28/2022 and underwent  Procedure(s): RIGHT TOTAL HIP ARTHROPLASTY ANTERIOR APPROACH.    Patient was given perioperative antibiotics:  Anti-infectives (From admission, onward)    Start     Dose/Rate Route Frequency Ordered Stop   05/28/22 1515  ceFAZolin (ANCEF) IVPB 2g/100 mL premix        2 g 200 mL/hr over 30 Minutes Intravenous Every 6 hours 05/28/22 1427 05/28/22 2230   05/28/22 0600  ceFAZolin (ANCEF) IVPB 3g/100 mL premix        3 g 200 mL/hr over 30 Minutes Intravenous On call to O.R. 05/28/22 4098 05/28/22 0815        Patient was given sequential compression devices, early ambulation, and chemoprophylaxis to prevent DVT.  Patient benefited maximally from hospital stay and there were no complications.    Recent vital signs: Patient  Vitals for the past 24 hrs:  BP Temp Temp src Pulse Resp SpO2  05/29/22 0752 (!) 124/59 98.1 F (36.7 C) Oral 73 20 98 %  05/29/22 0409 (!) 155/79 99 F (37.2 C) Oral 72 20 100 %  05/28/22 2337 136/65 98.7 F (37.1 C) Oral 73 20 100 %  05/28/22 2024 128/60 98.8 F (37.1 C) Oral 75 20 98 %  05/28/22 1425 (!) 155/90 97.6 F (36.4 C) Oral 66 18 98 %  05/28/22 1315 126/78 -- -- (!) 58 11 95 %  05/28/22 1300 136/80 -- -- (!) 59 16 95 %  05/28/22 1200 139/71 -- -- (!) 59 14 94 %  05/28/22 1100 (!) 142/64 98 F (36.7 C) -- (!) 58 11 93 %  05/28/22 1045 131/73 -- -- 67 16 92 %  05/28/22 1023 (!) 128/58 -- -- (!) 55 11 92 %  05/28/22 1008 117/70 -- -- (!) 57 14 93 %  05/28/22 0953 129/70 -- -- 69 14 93 %  05/28/22 0938 (!) 144/74 -- -- 72 17 94 %  05/28/22 0923 104/65 (!) 97.3 F (36.3 C) -- 76 19 94 %     Recent laboratory studies:  Recent Labs    05/29/22 0652  WBC 14.5*  HGB 11.4*  HCT 34.4*  PLT 215  NA 137  K 3.5  CL 104  CO2 23  BUN 13  CREATININE 1.00  GLUCOSE 127*  CALCIUM 8.6*     Discharge Medications:  Allergies as of 05/29/2022       Reactions   Contrast Media [iodinated Contrast Media] Swelling   Skin Peeling and break out   Codeine Itching   Other    NO PLASTIC TAPE - Blisters and skin removal        Medication List     STOP taking these medications    aspirin EC 81 MG tablet Replaced by: aspirin 81 MG chewable tablet   ibuprofen 200 MG tablet Commonly known as: ADVIL   traMADol 50 MG tablet Commonly known as: Ultram       TAKE these medications    amLODipine-valsartan 5-160 MG tablet Commonly known as: EXFORGE Take 1 tablet by mouth at bedtime.   aspirin 81 MG chewable tablet Chew 1 tablet (81 mg total) by mouth 2 (two) times daily. Replaces: aspirin EC 81 MG tablet   B-D UF III MINI PEN NEEDLES 31G X 5 MM Misc Generic drug: Insulin Pen Needle 1 Device by Does not apply route in the morning, at noon, in the evening, and at  bedtime.   cyanocobalamin 1000 MCG tablet Commonly known as: VITAMIN B12 Take 1,000 mcg by mouth in the morning.   docusate sodium 100 MG capsule Commonly known as: COLACE Take 100 mg by mouth daily after breakfast.   isosorbide mononitrate 30 MG 24 hr tablet Commonly known as: IMDUR Take 30 mg by mouth in the morning and at bedtime.   Lantus SoloStar 100 UNIT/ML Solostar Pen Generic drug: insulin glargine Inject 10 Units into the skin at bedtime.   levothyroxine 150 MCG tablet Commonly known as: SYNTHROID Take 1 tablet (150 mcg total) by mouth daily before breakfast.   methocarbamol 500 MG tablet Commonly known as: ROBAXIN Take 1 tablet (500 mg total) by mouth every 6 (six) hours as needed for muscle spasms.   metoprolol succinate 50 MG 24 hr tablet Commonly known as: TOPROL-XL Take 50 mg by mouth daily after breakfast.   Mitigare 0.6 MG Caps Generic drug: Colchicine Take 0.6 mg by mouth daily after breakfast.   nitroGLYCERIN 0.4 MG SL tablet Commonly known as: NITROSTAT Place 1 tablet (0.4 mg total) under the tongue every 5 (five) minutes as needed for chest pain (up to 3 doses).   NovoLOG FlexPen 100 UNIT/ML FlexPen Generic drug: insulin aspart Max Daily 30 units   oxyCODONE 5 MG immediate release tablet Commonly known as: Oxy IR/ROXICODONE Take 1-2 tablets (5-10 mg total) by mouth every 4 (four) hours as needed for moderate pain (pain score 4-6).   Ozempic (2 MG/DOSE) 8 MG/3ML Sopn Generic drug: Semaglutide (2 MG/DOSE) Inject 2 mg into the skin once a week.   rosuvastatin 40 MG tablet Commonly known as: CRESTOR Take 40 mg by mouth daily after breakfast.   traZODone 50 MG tablet Commonly known as: DESYREL Take 50 mg by mouth at bedtime as needed for sleep.               Durable Medical Equipment  (From admission, onward)           Start     Ordered   05/28/22 1428  DME 3 n 1  Once        05/28/22 1427   05/28/22 1428  DME Walker rolling   Once       Question Answer Comment  Walker: With 5 Inch Wheels   Patient needs a walker to treat with the following condition Status post total replacement of right hip  05/28/22 1427            Diagnostic Studies: DG Pelvis Portable  Result Date: 05/28/2022 CLINICAL DATA:  Status post replacement of the right hip. EXAM: PORTABLE PELVIS 1-2 VIEWS COMPARISON:  Intraoperative images 05/28/2022 FINDINGS: Single view of the pelvis demonstrates a right hip arthroplasty. The right hip appears located on this single view. No evidence for a periprosthetic fracture. Surgical skin staples along the lateral aspect of the right hip/upper thigh. Left hip appears located on this single view. IMPRESSION: Right hip arthroplasty without complicating features. Electronically Signed   By: Richarda Overlie M.D.   On: 05/28/2022 11:11   DG HIP UNILAT WITH PELVIS 1V RIGHT  Result Date: 05/28/2022 CLINICAL DATA:  Anterior right hip replacement. Intraoperative fluoroscopy. EXAM: DG HIP (WITH OR WITHOUT PELVIS) 1V RIGHT COMPARISON:  Right hip radiographs 03/05/2021 FINDINGS: Images were performed intraoperatively without the presence of a radiologist. The patient is undergoing total right hip arthroplasty. No hardware complication is seen. Total fluoroscopy images: 5 Total fluoroscopy time: 18 seconds Total dose: Radiation Exposure Index (as provided by the fluoroscopic device): 3.78 mGy air Kerma Please see intraoperative findings for further detail. IMPRESSION: Intraoperative fluoroscopy for total right hip arthroplasty. Electronically Signed   By: Neita Garnet M.D.   On: 05/28/2022 10:54   DG C-Arm 1-60 Min-No Report  Result Date: 05/28/2022 Fluoroscopy was utilized by the requesting physician.  No radiographic interpretation.    Disposition: Discharge disposition: 06-Home-Health Care Svc          Follow-up Information     [facility] Follow up.   Specialty: Home Health Services Why: The  home health agency will contact you for the first home visit Contact information: [address] [place] [address] [phone]         [name], MD Follow up in 2 week(s).   Specialty: Orthopedic Surgery Contact information: [address] [place] [address] [phone]                  Signed: Richardean Canal 05/29/2022, 8:46 AM

## 2022-05-29 NOTE — Progress Notes (Signed)
Physical Therapy Treatment Patient Details Name: [name] MRN: [id number] DOB: 1960/09/30 Today's Date: 05/29/2022   History of Present Illness 62 y.o. female presents to [facility] hospital on 05/28/2022 for elective R THA. PMH includes DM, spinal stenosis, NSTEMI, HTN.    PT Comments    Pt greeted supine in bed and eager for mobility with continued progress towards acute goals. Pt requiring grossly supervision assist with RW support for OOB mobility with light cues during gait for technique and increased heel strike. Pt able to ascend/descend 5 steps in stairwell without fault. Pt was educated on continued walker use to maximize functional independence, safety, and decrease risk for falls as well as safe car entry/exit, importance of continued mobility, HEP and compliance and ice. Pt without questions or concerns and anticipate safe discharge, with assist level outlined below, once medically cleared, will continue to follow acutely.    Recommendations for follow up therapy are one component of a multi-disciplinary discharge planning process, led by the attending physician.  Recommendations may be updated based on patient status, additional functional criteria and insurance authorization.  Follow Up Recommendations       Assistance Recommended at Discharge PRN  Patient can return home with the following A little help with bathing/dressing/bathroom;Assistance with cooking/housework;Assist for transportation;Help with stairs or ramp for entrance   Equipment Recommendations  Rolling walker (2 wheels)    Recommendations for Other Services       Precautions / Restrictions Precautions Precautions: Fall (direct anterior) Restrictions Weight Bearing Restrictions: Yes RLE Weight Bearing: Weight bearing as tolerated     Mobility  Bed Mobility Overal bed mobility: Needs Assistance Bed Mobility: Supine to Sit, Sit to Supine     Supine to sit: Min guard Sit to supine: Min guard    General bed mobility comments: pt able to self mobilize RLE with UEs    Transfers Overall transfer level: Needs assistance Equipment used: Rolling walker (2 wheels) Transfers: Sit to/from Stand Sit to Stand: Min guard           General transfer comment: good hand placement and steady rise    Ambulation/Gait Ambulation/Gait assistance: Supervision Gait Distance (Feet): 350 Feet Assistive device: Rolling walker (2 wheels) Gait Pattern/deviations: Step-through pattern Gait velocity: reduced     General Gait Details: slowed step-through gait, reduced stance time on RLE, cues for heel strike and upright posture   Stairs Stairs: Yes Stairs assistance: Min guard Stair Management: One rail Right, Step to pattern, Forwards Number of Stairs: 5 General stair comments: up/down without fault with cues for sequencing   Wheelchair Mobility    Modified Rankin (Stroke Patients Only)       Balance Overall balance assessment: Needs assistance Sitting-balance support: No upper extremity supported, Feet supported Sitting balance-Leahy Scale: Good     Standing balance support: Single extremity supported, Reliant on assistive device for balance Standing balance-Leahy Scale: Poor Standing balance comment: reliant on UE support during dynamic activity                            Cognition Arousal/Alertness: Awake/alert Behavior During Therapy: WFL for tasks assessed/performed Overall Cognitive Status: Within Functional Limits for tasks assessed                                          Exercises Other Exercises Other Exercises: verbally reviewed  all surgical hip exercises    General Comments General comments (skin integrity, edema, etc.): VSS on RA      Pertinent Vitals/Pain Pain Assessment Pain Assessment: Faces Faces Pain Scale: Hurts little more Pain Location: R hip Pain Descriptors / Indicators: Sore Pain Intervention(s): Monitored  during session, Limited activity within patient's tolerance    Home Living                          Prior Function            PT Goals (current goals can now be found in the care plan section) Acute Rehab PT Goals Patient Stated Goal: to return to independence, keep up with grandkids PT Goal Formulation: With patient Time For Goal Achievement: 06/01/22 Progress towards PT goals: Progressing toward goals    Frequency    7X/week      PT Plan      Co-evaluation              AM-PAC PT "6 Clicks" Mobility   Outcome Measure  Help needed turning from your back to your side while in a flat bed without using bedrails?: A Little Help needed moving from lying on your back to sitting on the side of a flat bed without using bedrails?: A Little Help needed moving to and from a bed to a chair (including a wheelchair)?: A Little Help needed standing up from a chair using your arms (e.g., wheelchair or bedside chair)?: A Little Help needed to walk in hospital room?: A Little Help needed climbing 3-5 steps with a railing? : A Little 6 Click Score: 18    End of Session Equipment Utilized During Treatment: Gait belt Activity Tolerance: Patient tolerated treatment well Patient left: in bed;with call bell/phone within reach;with family/visitor present Nurse Communication: Mobility status PT Visit Diagnosis: Other abnormalities of gait and mobility (R26.89);Muscle weakness (generalized) (M62.81);Pain Pain - Right/Left: Right Pain - part of body: Hip     Time: 1610-9604 PT Time Calculation (min) (ACUTE ONLY): 18 min  Charges:  $Gait Training: 8-22 mins                     Isela Stantz R. PTA Acute Rehabilitation Services Office: [phone]    Catalina Antigua 05/29/2022, 11:05 AM

## 2022-05-30 ENCOUNTER — Telehealth: Payer: Self-pay | Admitting: *Deleted

## 2022-05-30 DIAGNOSIS — I2510 Atherosclerotic heart disease of native coronary artery without angina pectoris: Secondary | ICD-10-CM | POA: Diagnosis not present

## 2022-05-30 DIAGNOSIS — M5431 Sciatica, right side: Secondary | ICD-10-CM | POA: Diagnosis not present

## 2022-05-30 DIAGNOSIS — E119 Type 2 diabetes mellitus without complications: Secondary | ICD-10-CM | POA: Diagnosis not present

## 2022-05-30 DIAGNOSIS — I10 Essential (primary) hypertension: Secondary | ICD-10-CM | POA: Diagnosis not present

## 2022-05-30 DIAGNOSIS — Z471 Aftercare following joint replacement surgery: Secondary | ICD-10-CM | POA: Diagnosis not present

## 2022-05-30 DIAGNOSIS — I252 Old myocardial infarction: Secondary | ICD-10-CM | POA: Diagnosis not present

## 2022-05-30 DIAGNOSIS — M48062 Spinal stenosis, lumbar region with neurogenic claudication: Secondary | ICD-10-CM | POA: Diagnosis not present

## 2022-05-30 DIAGNOSIS — M1990 Unspecified osteoarthritis, unspecified site: Secondary | ICD-10-CM | POA: Diagnosis not present

## 2022-05-30 DIAGNOSIS — G4733 Obstructive sleep apnea (adult) (pediatric): Secondary | ICD-10-CM | POA: Diagnosis not present

## 2022-05-30 NOTE — Telephone Encounter (Signed)
Attempted call to patient; no answer and left VM requesting call back. 

## 2022-06-01 DIAGNOSIS — M1990 Unspecified osteoarthritis, unspecified site: Secondary | ICD-10-CM | POA: Diagnosis not present

## 2022-06-01 DIAGNOSIS — I10 Essential (primary) hypertension: Secondary | ICD-10-CM | POA: Diagnosis not present

## 2022-06-01 DIAGNOSIS — I252 Old myocardial infarction: Secondary | ICD-10-CM | POA: Diagnosis not present

## 2022-06-01 DIAGNOSIS — Z471 Aftercare following joint replacement surgery: Secondary | ICD-10-CM | POA: Diagnosis not present

## 2022-06-01 DIAGNOSIS — I2510 Atherosclerotic heart disease of native coronary artery without angina pectoris: Secondary | ICD-10-CM | POA: Diagnosis not present

## 2022-06-01 DIAGNOSIS — M48062 Spinal stenosis, lumbar region with neurogenic claudication: Secondary | ICD-10-CM | POA: Diagnosis not present

## 2022-06-01 DIAGNOSIS — G4733 Obstructive sleep apnea (adult) (pediatric): Secondary | ICD-10-CM | POA: Diagnosis not present

## 2022-06-01 DIAGNOSIS — M5431 Sciatica, right side: Secondary | ICD-10-CM | POA: Diagnosis not present

## 2022-06-01 DIAGNOSIS — E119 Type 2 diabetes mellitus without complications: Secondary | ICD-10-CM | POA: Diagnosis not present

## 2022-06-03 DIAGNOSIS — G4733 Obstructive sleep apnea (adult) (pediatric): Secondary | ICD-10-CM | POA: Diagnosis not present

## 2022-06-03 DIAGNOSIS — M1990 Unspecified osteoarthritis, unspecified site: Secondary | ICD-10-CM | POA: Diagnosis not present

## 2022-06-03 DIAGNOSIS — M5431 Sciatica, right side: Secondary | ICD-10-CM | POA: Diagnosis not present

## 2022-06-03 DIAGNOSIS — I252 Old myocardial infarction: Secondary | ICD-10-CM | POA: Diagnosis not present

## 2022-06-03 DIAGNOSIS — I2510 Atherosclerotic heart disease of native coronary artery without angina pectoris: Secondary | ICD-10-CM | POA: Diagnosis not present

## 2022-06-03 DIAGNOSIS — E119 Type 2 diabetes mellitus without complications: Secondary | ICD-10-CM | POA: Diagnosis not present

## 2022-06-03 DIAGNOSIS — Z471 Aftercare following joint replacement surgery: Secondary | ICD-10-CM | POA: Diagnosis not present

## 2022-06-03 DIAGNOSIS — M48062 Spinal stenosis, lumbar region with neurogenic claudication: Secondary | ICD-10-CM | POA: Diagnosis not present

## 2022-06-03 DIAGNOSIS — I10 Essential (primary) hypertension: Secondary | ICD-10-CM | POA: Diagnosis not present

## 2022-06-05 DIAGNOSIS — E119 Type 2 diabetes mellitus without complications: Secondary | ICD-10-CM | POA: Diagnosis not present

## 2022-06-05 DIAGNOSIS — I2510 Atherosclerotic heart disease of native coronary artery without angina pectoris: Secondary | ICD-10-CM | POA: Diagnosis not present

## 2022-06-05 DIAGNOSIS — I10 Essential (primary) hypertension: Secondary | ICD-10-CM | POA: Diagnosis not present

## 2022-06-05 DIAGNOSIS — M48062 Spinal stenosis, lumbar region with neurogenic claudication: Secondary | ICD-10-CM | POA: Diagnosis not present

## 2022-06-05 DIAGNOSIS — Z471 Aftercare following joint replacement surgery: Secondary | ICD-10-CM | POA: Diagnosis not present

## 2022-06-05 DIAGNOSIS — M5431 Sciatica, right side: Secondary | ICD-10-CM | POA: Diagnosis not present

## 2022-06-05 DIAGNOSIS — M1990 Unspecified osteoarthritis, unspecified site: Secondary | ICD-10-CM | POA: Diagnosis not present

## 2022-06-05 DIAGNOSIS — G4733 Obstructive sleep apnea (adult) (pediatric): Secondary | ICD-10-CM | POA: Diagnosis not present

## 2022-06-05 DIAGNOSIS — I252 Old myocardial infarction: Secondary | ICD-10-CM | POA: Diagnosis not present

## 2022-06-06 DIAGNOSIS — Z794 Long term (current) use of insulin: Secondary | ICD-10-CM | POA: Diagnosis not present

## 2022-06-06 DIAGNOSIS — Z6841 Body Mass Index (BMI) 40.0 and over, adult: Secondary | ICD-10-CM | POA: Diagnosis not present

## 2022-06-06 DIAGNOSIS — I10 Essential (primary) hypertension: Secondary | ICD-10-CM | POA: Diagnosis not present

## 2022-06-06 DIAGNOSIS — E039 Hypothyroidism, unspecified: Secondary | ICD-10-CM | POA: Diagnosis not present

## 2022-06-06 DIAGNOSIS — I2510 Atherosclerotic heart disease of native coronary artery without angina pectoris: Secondary | ICD-10-CM | POA: Diagnosis not present

## 2022-06-06 DIAGNOSIS — E782 Mixed hyperlipidemia: Secondary | ICD-10-CM | POA: Diagnosis not present

## 2022-06-06 DIAGNOSIS — Z720 Tobacco use: Secondary | ICD-10-CM | POA: Diagnosis not present

## 2022-06-06 DIAGNOSIS — E1165 Type 2 diabetes mellitus with hyperglycemia: Secondary | ICD-10-CM | POA: Diagnosis not present

## 2022-06-07 DIAGNOSIS — I2510 Atherosclerotic heart disease of native coronary artery without angina pectoris: Secondary | ICD-10-CM | POA: Diagnosis not present

## 2022-06-07 DIAGNOSIS — G4733 Obstructive sleep apnea (adult) (pediatric): Secondary | ICD-10-CM | POA: Diagnosis not present

## 2022-06-07 DIAGNOSIS — M48062 Spinal stenosis, lumbar region with neurogenic claudication: Secondary | ICD-10-CM | POA: Diagnosis not present

## 2022-06-07 DIAGNOSIS — Z471 Aftercare following joint replacement surgery: Secondary | ICD-10-CM | POA: Diagnosis not present

## 2022-06-07 DIAGNOSIS — I252 Old myocardial infarction: Secondary | ICD-10-CM | POA: Diagnosis not present

## 2022-06-07 DIAGNOSIS — M5431 Sciatica, right side: Secondary | ICD-10-CM | POA: Diagnosis not present

## 2022-06-07 DIAGNOSIS — M1990 Unspecified osteoarthritis, unspecified site: Secondary | ICD-10-CM | POA: Diagnosis not present

## 2022-06-07 DIAGNOSIS — E119 Type 2 diabetes mellitus without complications: Secondary | ICD-10-CM | POA: Diagnosis not present

## 2022-06-07 DIAGNOSIS — I10 Essential (primary) hypertension: Secondary | ICD-10-CM | POA: Diagnosis not present

## 2022-06-10 ENCOUNTER — Encounter: Payer: Self-pay | Admitting: Orthopaedic Surgery

## 2022-06-10 ENCOUNTER — Other Ambulatory Visit: Payer: Self-pay

## 2022-06-10 ENCOUNTER — Ambulatory Visit (INDEPENDENT_AMBULATORY_CARE_PROVIDER_SITE_OTHER): Payer: Medicare HMO | Admitting: Orthopaedic Surgery

## 2022-06-10 DIAGNOSIS — Z96641 Presence of right artificial hip joint: Secondary | ICD-10-CM

## 2022-06-10 NOTE — Progress Notes (Signed)
The patient is here for first postoperative visit status post a right total hip arthroplasty.  She is only taken ibuprofen for pain and Robaxin.  She has been compliant with a baby aspirin twice daily.  She was on 1 aspirin a day before surgery.  I did let her know she can go back to once a day.  She has home therapy has been working with her.  She would like some sessions of outpatient therapy to work on her balance and coordination and her mobility.  She reports that she is doing well.  Her right hip incision looks good in terms of the staples are removed and Steri-Strips applied.  She does have a large seroma and aspirated probably over 200 cc of fluid from around the incision area.  There is no evidence of infection on.  This was thin fluid.  She would like a few sessions of outpatient physical therapy so we will work with case manager on getting this set up.  From my standpoint we will see her back in 4 weeks to see how she is doing overall.  No x-rays will be needed.

## 2022-06-11 DIAGNOSIS — Z471 Aftercare following joint replacement surgery: Secondary | ICD-10-CM | POA: Diagnosis not present

## 2022-06-11 DIAGNOSIS — M1990 Unspecified osteoarthritis, unspecified site: Secondary | ICD-10-CM | POA: Diagnosis not present

## 2022-06-11 DIAGNOSIS — M48062 Spinal stenosis, lumbar region with neurogenic claudication: Secondary | ICD-10-CM | POA: Diagnosis not present

## 2022-06-11 DIAGNOSIS — G4733 Obstructive sleep apnea (adult) (pediatric): Secondary | ICD-10-CM | POA: Diagnosis not present

## 2022-06-11 DIAGNOSIS — I2510 Atherosclerotic heart disease of native coronary artery without angina pectoris: Secondary | ICD-10-CM | POA: Diagnosis not present

## 2022-06-11 DIAGNOSIS — I10 Essential (primary) hypertension: Secondary | ICD-10-CM | POA: Diagnosis not present

## 2022-06-11 DIAGNOSIS — I252 Old myocardial infarction: Secondary | ICD-10-CM | POA: Diagnosis not present

## 2022-06-11 DIAGNOSIS — M5431 Sciatica, right side: Secondary | ICD-10-CM | POA: Diagnosis not present

## 2022-06-11 DIAGNOSIS — E119 Type 2 diabetes mellitus without complications: Secondary | ICD-10-CM | POA: Diagnosis not present

## 2022-06-13 ENCOUNTER — Ambulatory Visit (INDEPENDENT_AMBULATORY_CARE_PROVIDER_SITE_OTHER): Payer: Medicare HMO | Admitting: Orthopaedic Surgery

## 2022-06-13 ENCOUNTER — Telehealth: Payer: Self-pay | Admitting: *Deleted

## 2022-06-13 DIAGNOSIS — Z96641 Presence of right artificial hip joint: Secondary | ICD-10-CM

## 2022-06-13 NOTE — Telephone Encounter (Signed)
Pt states she has another hematoma around her incision site and painful and wants to know if can get drained again. Please advise

## 2022-06-13 NOTE — Progress Notes (Signed)
The patient is returning for follow-up after a right hip replacement.  We just saw her last week and she did have a large postoperative seroma that we aspirated about 200 cc of fluid from around her hip.  She is walking excellent without any assistive ice and doing great but she does have recurrence of her seroma so she did come in earlier today to have it drained.  She denies any fever chills or other issues.  She does have a moderate seroma and I was able to aspirate another 200 cc of fluid off of her hip.  This gave her immediate relief.  She understands that this will likely reaccumulate and we can always aspirated again next week if needed.  If not we will see her at her regular follow-up later in June.

## 2022-06-13 NOTE — Telephone Encounter (Signed)
Can work in this afternoon for this. Lvm to discuss time with pt

## 2022-06-13 NOTE — Telephone Encounter (Signed)
Called and worked in 

## 2022-06-17 NOTE — Therapy (Unsigned)
OUTPATIENT PHYSICAL THERAPY LOWER EXTREMITY EVALUATION   Patient Name: [name] MRN: [id number] DOB:03-Mar-1960, 62 y.o., female Today's Date: 06/19/2022  END OF SESSION:  PT End of Session - 06/19/22 1053     Visit Number 1    Number of Visits 8    Date for PT Re-Evaluation 08/14/22    Authorization Type [employer] MCR    PT Start Time 1045    PT Stop Time 1130    PT Time Calculation (min) 45 min    Activity Tolerance Patient tolerated treatment well    Behavior During Therapy WFL for tasks assessed/performed             Past Medical History:  Diagnosis Date   ALLERGIC RHINITIS    Allergy to IVP dye    Arthritis    Coronary artery disease    Diabetes mellitus, type 2 (HCC)    History of tobacco abuse    Quit 2011   Hypertension    Hypertriglyceridemia    Hypothyroidism    NSTEMI (non-ST elevated myocardial infarction) (HCC) 09/30/2011   NO CAD by cath, ? coronary vasospasm   Obesity    Sleep apnea    Past Surgical History:  Procedure Laterality Date   BREAST BIOPSY Left 03/05/2022   [place] LT BREAST BX W LOC DEV 1ST LESION IMG BX SPEC US GUIDE 03/05/2022 [facility]   BREAST BIOPSY  04/25/2022   MM LT RADIOACTIVE SEED LOC MAMMO GUIDE 04/25/2022 [facility]   BREAST LUMPECTOMY WITH RADIOACTIVE SEED LOCALIZATION Left 04/26/2022   Procedure: LEFT BREAST LUMPECTOMY WITH RADIOACTIVE SEED LOCALIZATION;  Surgeon: Griselda Miner, MD;  Location: [facility];  Service: General;  Laterality: Left;   CORONARY STENT INTERVENTION N/A 08/19/2017   Procedure: CORONARY STENT INTERVENTION;  Surgeon: Yates Decamp, MD;  Location: [facility];  Service: Cardiovascular;  Laterality: N/A;   KNEE ARTHROSCOPY W/ MENISCAL REPAIR Left 2009   KNEE ARTHROSCOPY W/ MENISCAL REPAIR Right 2014   LEFT HEART CATH AND CORONARY ANGIOGRAPHY N/A 08/19/2017   Procedure: LEFT HEART CATH AND CORONARY ANGIOGRAPHY;  Surgeon: Yates Decamp, MD;  Location: [facility];   Service: Cardiovascular;  Laterality: N/A;   LEFT HEART CATHETERIZATION WITH CORONARY ANGIOGRAM N/A 09/30/2011   Procedure: LEFT HEART CATHETERIZATION WITH CORONARY ANGIOGRAM;  Surgeon: Kathleene Hazel, MD;  Location: [facility];  Service: Cardiovascular;  Laterality: N/A;   LUMBAR DISC SURGERY  11/27/2020   L4-5   TOTAL HIP ARTHROPLASTY Right 05/28/2022   Procedure: RIGHT TOTAL HIP ARTHROPLASTY ANTERIOR APPROACH;  Surgeon: [name], MD;  Location: [facility];  Service: Orthopedics;  Laterality: Right;   TUBAL LIGATION     VESICOVAGINAL FISTULA CLOSURE W/ TAH     Patient Active Problem List   Diagnosis Date Noted   Status post total replacement of right hip 05/28/2022   Diabetes mellitus (HCC) 03/16/2021   Spondylolisthesis, lumbar region 11/27/2020    Class: Chronic   Spinal stenosis, lumbar region with neurogenic claudication 11/27/2020    Class: Chronic   Fusion of spine of lumbar region 11/27/2020   Severe obstructive sleep apnea 11/09/2018   Healthcare maintenance 11/09/2018   At risk for obstructive sleep apnea 04/16/2018   Acquired hypothyroidism 03/17/2018   DOE (dyspnea on exertion) 03/16/2018   NSTEMI (non-ST elevated myocardial infarction) (HCC) 08/18/2017   Right sided sciatica 07/10/2017   Right low back pain 07/10/2017   Chest pain 09/29/2011   Morbid obesity (HCC) 09/29/2011  DM type 2 (diabetes mellitus, type 2) (HCC) 09/29/2011   HTN (hypertension) 09/29/2011   Pneumonia, bacterial 08/07/2010   Incidental lung nodule, > 3mm and < 8mm 08/07/2010   Bronchitis 08/07/2010    PCP: Jackie Plum, MD   REFERRING PROVIDER: [name], MD   REFERRING DIAG: [phone] (ICD-10-CM) - Status post total replacement of right hip  THERAPY DIAG:  Pain in right hip - Plan: PT plan of care cert/re-cert  Unsteadiness on feet - Plan: PT plan of care cert/re-cert  Status post total replacement of right hip  Rationale for Evaluation and  Treatment: Rehabilitation  ONSET DATE: 05/28/22 surgery  SUBJECTIVE:   SUBJECTIVE STATEMENT: Doing well following R THA anterior approach.  Pain minimal, has returned to ambulation in home w/o AD, driving w/o setback, negotiating 3 stairs at home.  Main concern is hip flexor weakness.  PERTINENT HISTORY: The patient is here for first postoperative visit status post a right total hip arthroplasty.  She is only taken ibuprofen for pain and Robaxin.  She has been compliant with a baby aspirin twice daily.  She was on 1 aspirin a day before surgery.  I did let her know she can go back to once a day.  She has home therapy has been working with her.  She would like some sessions of outpatient therapy to work on her balance and coordination and her mobility.  She reports that she is doing well.   Her right hip incision looks good in terms of the staples are removed and Steri-Strips applied.  She does have a large seroma and aspirated probably over 200 cc of fluid from around the incision area.  There is no evidence of infection on.  This was thin fluid. PAIN:  Are you having pain? Yes: NPRS scale: 2/10 Pain location: R thigh region Pain description: ache Aggravating factors: activity Relieving factors: rest  PRECAUTIONS: Anterior hip  WEIGHT BEARING RESTRICTIONS: No  FALLS:  Has patient fallen in last 6 months? No  OCCUPATION: retired  PLOF: Independent  PATIENT GOALS: To regain my hip strength and function  NEXT MD VISIT: 4 weeks  OBJECTIVE:   DIAGNOSTIC FINDINGS: FINDINGS: Single view of the pelvis demonstrates a right hip arthroplasty. The right hip appears located on this single view. No evidence for a periprosthetic fracture. Surgical skin staples along the lateral aspect of the right hip/upper thigh. Left hip appears located on this single view.   IMPRESSION: Right hip arthroplasty without complicating features.     Electronically Signed   By: Richarda Overlie M.D.   On:  05/28/2022 11:11  PATIENT SURVEYS:  FOTO 51(64 predicted)  MUSCLE LENGTH: Hamstrings: Right 80 deg; Left 80 deg  POSTURE: No Significant postural limitations  PALPATION: deferred  LOWER EXTREMITY ROM:  A/PROM Right eval Left eval  Hip flexion 90/110d WNL  Hip extension 0/10d WNL  Hip abduction 30/30d WNL  Hip adduction    Hip internal rotation    Hip external rotation    Knee flexion WNL WNL  Knee extension WNL WNL  Ankle dorsiflexion    Ankle plantarflexion    Ankle inversion    Ankle eversion     (Blank rows = not tested)  LOWER EXTREMITY MMT:  MMT Right eval Left eval  Hip flexion 3 5  Hip extension 3+ 5  Hip abduction 3+ 5  Hip adduction    Hip internal rotation    Hip external rotation    Knee flexion 4 5  Knee  extension 4 5  Ankle dorsiflexion    Ankle plantarflexion 4 5  Ankle inversion    Ankle eversion     (Blank rows = not tested)  LOWER EXTREMITY SPECIAL TESTS:  Deferred post-op  FUNCTIONAL TESTS:  5 times sit to stand: 18s arms crossed  GAIT: Distance walked: 116ftx2 Assistive device utilized: Quad cane small base Level of assistance: Complete Independence Comments: mild R lateral WS   TODAY'S TREATMENT:                                                                                                                              DATE: 06/19/22 Eval and HEP    PATIENT EDUCATION:  Education details: Discussed eval findings, rehab rationale and POC and patient is in agreement  Person educated: Patient Education method: Explanation Education comprehension: verbalized understanding and needs further education  HOME EXERCISE PROGRAM: Access Code: [email or web address] URL: [email or web address] Date: 06/19/2022 Prepared by: Gustavus Bryant  Exercises - Modified Erby Pian  - 2 x daily - 5 x weekly - 1 sets - 2 reps - 30s hold - Supine Bridge with Mini Swiss Ball Between Knees  - 2 x daily - 5 x weekly - 1 sets - 15 reps -  Hooklying Single Leg Bent Knee Fallouts with Resistance  - 2 x daily - 5 x weekly - 1 sets - 15 reps - Standing Heel Raise with Chair Support  - 2 x daily - 5 x weekly - 1 sets - 15 reps - Standing [employer] at [employer]  - 2 x daily - 5 x weekly - 1 sets - 10 reps  ASSESSMENT:  CLINICAL IMPRESSION: Patient is a 62 y.o. female who was seen today for physical therapy evaluation and treatment s/p R THA anterior approach approximately 3 weeks ago, undergoing a short course of HHPT.  She reports minimal pain, has returned to in house ambulation w/o AD, has returned to driving and can negotiate 3 steps at home.  Objective findings show mild ROM and strength deficits,  18s 5x STS score ambulation with LBQC, minimal pain levels and FOTO score of 51.    OBJECTIVE IMPAIRMENTS: Abnormal gait, decreased activity tolerance, decreased endurance, decreased mobility, difficulty walking, decreased ROM, decreased strength, and impaired perceived functional ability.   ACTIVITY LIMITATIONS: carrying, lifting, sitting, standing, squatting, and stairs  PERSONAL FACTORS: Age, Behavior pattern, Fitness, and 1 comorbidity: DM  are also affecting patient's functional outcome.   REHAB POTENTIAL: Good  CLINICAL DECISION MAKING: Stable/uncomplicated  EVALUATION COMPLEXITY: Low   GOALS: Goals reviewed with patient? No  SHORT TERM GOALS: Target date: 07/03/2022   Patient to demonstrate independence in HEP  Baseline: 4KEYT2X4 Goal status: INITIAL  LONG TERM GOALS: Target date: 07/17/2022  110d AROM R hip flexion, 10d extension Baseline:  A/PROM Right eval Left eval  Hip flexion 90/110d WNL  Hip extension 0/10d WNL  Hip abduction 30/30d WNL   Goal status: INITIAL  2.  Increase  R hip strength to 4/5 Baseline:  MMT Right eval Left eval  Hip flexion 3 5  Hip extension 3+ 5  Hip abduction 3+ 5   Goal status: INITIAL  3.  Increase FOTO score to 64 Baseline: 51 Goal status: INITIAL  4.  259ft  ambulation w/o need of AD Baseline: 137ft with LBQC Goal status: INITIAL     PLAN:  PT FREQUENCY: 1-2x/week  PT DURATION: 4 weeks  PLANNED INTERVENTIONS: Therapeutic exercises, Therapeutic activity, Neuromuscular re-education, Balance training, Gait training, Patient/Family education, Self Care, Joint mobilization, Stair training, Dry Needling, Manual therapy, and Re-evaluation  PLAN FOR NEXT SESSION: HEP review and update, manual techniques as appropriate, aerobic tasks, ROM and flexibility activities, strengthening and PREs, TPDN, gait and balance training as needed     Hildred Laser, PT 06/19/2022, 11:59 AM

## 2022-06-19 ENCOUNTER — Other Ambulatory Visit: Payer: Self-pay

## 2022-06-19 ENCOUNTER — Ambulatory Visit: Payer: Medicare HMO | Attending: Orthopaedic Surgery

## 2022-06-19 DIAGNOSIS — M25551 Pain in right hip: Secondary | ICD-10-CM | POA: Diagnosis not present

## 2022-06-19 DIAGNOSIS — R2681 Unsteadiness on feet: Secondary | ICD-10-CM | POA: Diagnosis not present

## 2022-06-19 DIAGNOSIS — Z96641 Presence of right artificial hip joint: Secondary | ICD-10-CM | POA: Insufficient documentation

## 2022-06-20 DIAGNOSIS — I2510 Atherosclerotic heart disease of native coronary artery without angina pectoris: Secondary | ICD-10-CM | POA: Diagnosis not present

## 2022-06-20 DIAGNOSIS — Z794 Long term (current) use of insulin: Secondary | ICD-10-CM | POA: Diagnosis not present

## 2022-06-20 DIAGNOSIS — R110 Nausea: Secondary | ICD-10-CM | POA: Diagnosis not present

## 2022-06-20 DIAGNOSIS — E039 Hypothyroidism, unspecified: Secondary | ICD-10-CM | POA: Diagnosis not present

## 2022-06-20 DIAGNOSIS — E782 Mixed hyperlipidemia: Secondary | ICD-10-CM | POA: Diagnosis not present

## 2022-06-20 DIAGNOSIS — I10 Essential (primary) hypertension: Secondary | ICD-10-CM | POA: Diagnosis not present

## 2022-06-20 DIAGNOSIS — J302 Other seasonal allergic rhinitis: Secondary | ICD-10-CM | POA: Diagnosis not present

## 2022-06-20 DIAGNOSIS — Z720 Tobacco use: Secondary | ICD-10-CM | POA: Diagnosis not present

## 2022-06-20 DIAGNOSIS — E1165 Type 2 diabetes mellitus with hyperglycemia: Secondary | ICD-10-CM | POA: Diagnosis not present

## 2022-06-22 ENCOUNTER — Encounter: Payer: Self-pay | Admitting: Physical Therapy

## 2022-06-22 ENCOUNTER — Ambulatory Visit: Payer: Medicare HMO | Admitting: Physical Therapy

## 2022-06-22 DIAGNOSIS — Z96641 Presence of right artificial hip joint: Secondary | ICD-10-CM

## 2022-06-22 DIAGNOSIS — M25551 Pain in right hip: Secondary | ICD-10-CM | POA: Diagnosis not present

## 2022-06-22 DIAGNOSIS — R2681 Unsteadiness on feet: Secondary | ICD-10-CM | POA: Diagnosis not present

## 2022-06-22 NOTE — Therapy (Signed)
OUTPATIENT PHYSICAL THERAPY TREATMENT NOTE   Patient Name: [name] MRN: [id number] DOB:Jul 04, 1960, 62 y.o., female Today's Date: 06/22/2022  PCP: Jackie Plum, MD    REFERRING PROVIDER: [name], MD   PT End of Session - 06/22/22 [phone]     Visit Number 2    Number of Visits 8    Date for PT Re-Evaluation 08/14/22    Authorization Type [employer] MCR    Activity Tolerance Patient tolerated treatment well    Behavior During Therapy [facility] for tasks assessed/performed             Past Medical History:  Diagnosis Date   ALLERGIC RHINITIS    Allergy to IVP dye    Arthritis    Coronary artery disease    Diabetes mellitus, type 2 (HCC)    History of tobacco abuse    Quit 2011   Hypertension    Hypertriglyceridemia    Hypothyroidism    NSTEMI (non-ST elevated myocardial infarction) (HCC) 09/30/2011   NO CAD by cath, ? coronary vasospasm   Obesity    Sleep apnea    Past Surgical History:  Procedure Laterality Date   BREAST BIOPSY Left 03/05/2022   [place] LT BREAST BX W LOC DEV 1ST LESION IMG BX SPEC US GUIDE 03/05/2022 [facility]   BREAST BIOPSY  04/25/2022   MM LT RADIOACTIVE SEED LOC MAMMO GUIDE 04/25/2022 [facility]   BREAST LUMPECTOMY WITH RADIOACTIVE SEED LOCALIZATION Left 04/26/2022   Procedure: LEFT BREAST LUMPECTOMY WITH RADIOACTIVE SEED LOCALIZATION;  Surgeon: Griselda Miner, MD;  Location: [facility];  Service: General;  Laterality: Left;   CORONARY STENT INTERVENTION N/A 08/19/2017   Procedure: CORONARY STENT INTERVENTION;  Surgeon: Yates Decamp, MD;  Location: [facility];  Service: Cardiovascular;  Laterality: N/A;   KNEE ARTHROSCOPY W/ MENISCAL REPAIR Left 2009   KNEE ARTHROSCOPY W/ MENISCAL REPAIR Right 2014   LEFT HEART CATH AND CORONARY ANGIOGRAPHY N/A 08/19/2017   Procedure: LEFT HEART CATH AND CORONARY ANGIOGRAPHY;  Surgeon: Yates Decamp, MD;  Location: [facility];  Service: Cardiovascular;   Laterality: N/A;   LEFT HEART CATHETERIZATION WITH CORONARY ANGIOGRAM N/A 09/30/2011   Procedure: LEFT HEART CATHETERIZATION WITH CORONARY ANGIOGRAM;  Surgeon: Kathleene Hazel, MD;  [facility]: [facility]ATH [facility];  Service: Cardiovascular;  Laterality: N/A;   LUMBAR DISC SURGERY  11/27/2020   L4-5   TOTAL HIP ARTHROPLASTY Right 05/28/2022   Procedure: RIGHT TOTAL HIP ARTHROPLASTY ANTERIOR APPROACH;  Surgeon: [name], MD;  Location: [facility];  Service: Orthopedics;  Laterality: Right;   TUBAL LIGATION     VESICOVAGINAL FISTULA CLOSURE W/ TAH     Patient Active Problem List   Diagnosis Date Noted   Status post total replacement of right hip 05/28/2022   Diabetes mellitus (HCC) 03/16/2021   Spondylolisthesis, lumbar region 11/27/2020    Class: Chronic   Spinal stenosis, lumbar region with neurogenic claudication 11/27/2020    Class: Chronic   Fusion of spine of lumbar region 11/27/2020   Severe obstructive sleep apnea 11/09/2018   Healthcare maintenance 11/09/2018   At risk for obstructive sleep apnea 04/16/2018   Acquired hypothyroidism 03/17/2018   DOE (dyspnea on exertion) 03/16/2018   NSTEMI (non-ST elevated myocardial infarction) (HCC) 08/18/2017   Right sided sciatica 07/10/2017   Right low back pain 07/10/2017   Chest pain 09/29/2011   Morbid obesity (HCC) 09/29/2011   DM type 2 (diabetes mellitus, type 2) (HCC) 09/29/2011   HTN (  hypertension) 09/29/2011   Pneumonia, bacterial 08/07/2010   Incidental lung nodule, > 3mm and < 8mm 08/07/2010   Bronchitis 08/07/2010    THERAPY DIAG:  Pain in right hip  Unsteadiness on feet  Status post total replacement of right hip   Rationale for Evaluation and Treatment: Rehabilitation  REFERRING DIAG: Z96.641 (ICD-10-CM) - Status post total replacement of right hip   PERTINENT HISTORY: spinal stenosis, DM T2, NSTEMI  PRECAUTIONS/RESTRICTIONS:   R A THA 4/23  SUBJECTIVE:  Pt reports that her hips are a little stiff  this morning, possibly d/t the the rain.  Overall she see's "90%" improvement in her hip since surgery.   PAIN:  Are you having pain? Yes: NPRS scale: 2/10 Pain location: R thigh region Pain description: ache Aggravating factors: activity Relieving factors: rest  OBJECTIVE: (objective measures completed at initial evaluation unless otherwise dated)  DIAGNOSTIC FINDINGS: FINDINGS: Single view of the pelvis demonstrates a right hip arthroplasty. The right hip appears located on this single view. No evidence for a periprosthetic fracture. Surgical skin staples along the lateral aspect of the right hip/upper thigh. Left hip appears located on this single view.   IMPRESSION: Right hip arthroplasty without complicating features.     Electronically Signed   By: Richarda Overlie M.D.   On: 05/28/2022 11:11   PATIENT SURVEYS:  FOTO 51(64 predicted)   MUSCLE LENGTH: Hamstrings: Right 80 deg; Left 80 deg   POSTURE: No Significant postural limitations   PALPATION: deferred   LOWER EXTREMITY ROM:   A/PROM Right eval Left eval  Hip flexion 90/110d WNL  Hip extension 0/10d WNL  Hip abduction 30/30d WNL  Hip adduction      Hip internal rotation      Hip external rotation      Knee flexion WNL WNL  Knee extension WNL WNL  Ankle dorsiflexion      Ankle plantarflexion      Ankle inversion      Ankle eversion       (Blank rows = not tested)   LOWER EXTREMITY MMT:   MMT Right eval Left eval  Hip flexion 3 5  Hip extension 3+ 5  Hip abduction 3+ 5  Hip adduction      Hip internal rotation      Hip external rotation      Knee flexion 4 5  Knee extension 4 5  Ankle dorsiflexion      Ankle plantarflexion 4 5  Ankle inversion      Ankle eversion       (Blank rows = not tested)   LOWER EXTREMITY SPECIAL TESTS:  Deferred post-op   FUNCTIONAL TESTS:  5 times sit to stand: 18s arms crossed   GAIT: Distance walked: 142ftx2 Assistive device utilized: Quad cane small  base Level of assistance: Complete Independence Comments: mild R lateral WS     TODAY'S TREATMENT:  DATE: 06/19/22 Eval and HEP      PATIENT EDUCATION:  Education details: Discussed eval findings, rehab rationale and POC and patient is in agreement  Person educated: Patient Education method: Explanation Education comprehension: verbalized understanding and needs further education   HOME EXERCISE PROGRAM: Access Code: [email or web address] URL: [email or web address] Date: 06/22/2022 Prepared by: Alphonzo Severance  Exercises - Modified Thomas Stretch  - 2 x daily - 5 x weekly - 1 sets - 2 reps - 30s hold - Supine Bridge with Mini Swiss Ball Between Knees  - 2 x daily - 5 x weekly - 1 sets - 15 reps - Hooklying Single Leg Bent Knee Fallouts with Resistance  - 2 x daily - 5 x weekly - 1 sets - 15 reps - Standing Heel Raise with Chair Support  - 2 x daily - 5 x weekly - 1 sets - 15 reps - Standing [employer] at [employer]  - 2 x daily - 5 x weekly - 1 sets - 10 reps - Seated March with Resistance  - 1 x daily - 7 x weekly - 3 sets - 10 reps  TREATMENT 5/18:  Therapeutic Exercise: - nu-step L5 3m LE only while taking subjective and planning session with patient - LTR - 20x - supine march - RTB - 2x10 ea - Alternating clam GTB - 2x10 ea - bridge on ball - short lever arm - 3x10 - SLR with strap assist - 3x10 - knee ext machine - 3x10 bil   CLINICAL IMPRESSION: Ashani tolerated session well with no adverse reaction.  Concentrated on basic mat hip strengthening to good effect.  Hip flexion is a challenge for her and should continue to be addressed.  Updated HEP.   OBJECTIVE IMPAIRMENTS: Abnormal gait, decreased activity tolerance, decreased endurance, decreased mobility, difficulty walking, decreased ROM, decreased strength, and impaired perceived functional  ability.    ACTIVITY LIMITATIONS: carrying, lifting, sitting, standing, squatting, and stairs   PERSONAL FACTORS: Age, Behavior pattern, Fitness, and 1 comorbidity: DM  are also affecting patient's functional outcome.    REHAB POTENTIAL: Good   CLINICAL DECISION MAKING: Stable/uncomplicated   EVALUATION COMPLEXITY: Low     GOALS: Goals reviewed with patient? No   SHORT TERM GOALS: Target date: 07/03/2022   Patient to demonstrate independence in HEP  Baseline: 4KEYT2X4 Goal status: INITIAL   LONG TERM GOALS: Target date: 07/17/2022   110d AROM R hip flexion, 10d extension Baseline:  A/PROM Right eval Left eval  Hip flexion 90/110d WNL  Hip extension 0/10d WNL  Hip abduction 30/30d WNL    Goal status: INITIAL   2.  Increase R hip strength to 4/5 Baseline:  MMT Right eval Left eval  Hip flexion 3 5  Hip extension 3+ 5  Hip abduction 3+ 5    Goal status: INITIAL   3.  Increase FOTO score to 64 Baseline: 51 Goal status: INITIAL   4.  273ft ambulation w/o need of AD Baseline: 156ft with LBQC Goal status: INITIAL         PLAN:   PT FREQUENCY: 1-2x/week   PT DURATION: 4 weeks   PLANNED INTERVENTIONS: Therapeutic exercises, Therapeutic activity, Neuromuscular re-education, Balance training, Gait training, Patient/Family education, Self Care, Joint mobilization, Stair training, Dry Needling, Manual therapy, and Re-evaluation   PLAN FOR NEXT SESSION: HEP review and update, manual techniques as appropriate, aerobic tasks, ROM and flexibility activities, strengthening and PREs, TPDN, gait and balance training as needed       Kimberlee Nearing  Coleston Dirosa PT 06/22/2022, 9:02 AM

## 2022-06-26 ENCOUNTER — Ambulatory Visit: Payer: Medicare HMO

## 2022-06-26 DIAGNOSIS — M25551 Pain in right hip: Secondary | ICD-10-CM | POA: Diagnosis not present

## 2022-06-26 DIAGNOSIS — R2681 Unsteadiness on feet: Secondary | ICD-10-CM

## 2022-06-26 DIAGNOSIS — Z96641 Presence of right artificial hip joint: Secondary | ICD-10-CM | POA: Diagnosis not present

## 2022-06-26 NOTE — Therapy (Signed)
OUTPATIENT PHYSICAL THERAPY TREATMENT NOTE   Patient Name: [name] MRN: [id number] DOB:[birth year], 62 y.o., female Today's Date: 06/26/2022  PCP: Jackie Plum, MD    REFERRING PROVIDER: [name], MD   PT End of Session - 06/26/22 0827     Visit Number 3    Number of Visits 8    Date for PT Re-Evaluation 08/14/22    Authorization Type [employer] MCR    PT Start Time 0830    PT Stop Time 0908    PT Time Calculation (min) 38 min    Activity Tolerance Patient tolerated treatment well    Behavior During Therapy WFL for tasks assessed/performed              Past Medical History:  Diagnosis Date   ALLERGIC RHINITIS    Allergy to IVP dye    Arthritis    Coronary artery disease    Diabetes mellitus, type 2 (HCC)    History of tobacco abuse    Quit 2011   Hypertension    Hypertriglyceridemia    Hypothyroidism    NSTEMI (non-ST elevated myocardial infarction) (HCC) 09/30/2011   NO CAD by cath, ? coronary vasospasm   Obesity    Sleep apnea    Past Surgical History:  Procedure Laterality Date   BREAST BIOPSY Left 03/05/2022   [place] LT BREAST BX W LOC DEV 1ST LESION IMG BX SPEC US GUIDE 03/05/2022 [facility]   BREAST BIOPSY  04/25/2022   MM LT RADIOACTIVE SEED LOC MAMMO GUIDE 04/25/2022 [facility]   BREAST LUMPECTOMY WITH RADIOACTIVE SEED LOCALIZATION Left 04/26/2022   Procedure: LEFT BREAST LUMPECTOMY WITH RADIOACTIVE SEED LOCALIZATION;  Surgeon: Griselda Miner, MD;  Location: [facility];  Service: General;  Laterality: Left;   CORONARY STENT INTERVENTION N/A 08/19/2017   Procedure: CORONARY STENT INTERVENTION;  Surgeon: Yates Decamp, MD;  Location: [facility];  Service: Cardiovascular;  Laterality: N/A;   KNEE ARTHROSCOPY W/ MENISCAL REPAIR Left 2009   KNEE ARTHROSCOPY W/ MENISCAL REPAIR Right 2014   LEFT HEART CATH AND CORONARY ANGIOGRAPHY N/A 08/19/2017   Procedure: LEFT HEART CATH AND CORONARY ANGIOGRAPHY;   Surgeon: Yates Decamp, MD;  Location: [facility];  Service: Cardiovascular;  Laterality: N/A;   LEFT HEART CATHETERIZATION WITH CORONARY ANGIOGRAM N/A 09/30/2011   Procedure: LEFT HEART CATHETERIZATION WITH CORONARY ANGIOGRAM;  Surgeon: Kathleene Hazel, MD;  [facility];  Service: Cardiovascular;  Laterality: N/A;   LUMBAR DISC SURGERY  11/27/2020   L4-5   TOTAL HIP ARTHROPLASTY Right 05/28/2022   Procedure: RIGHT TOTAL HIP ARTHROPLASTY ANTERIOR APPROACH;  Surgeon: [name], MD;  Location: [facility];  Service: Orthopedics;  Laterality: Right;   TUBAL LIGATION     VESICOVAGINAL FISTULA CLOSURE W/ TAH     Patient Active Problem List   Diagnosis Date Noted   Status post total replacement of right hip 05/28/2022   Diabetes mellitus (HCC) 03/16/2021   Spondylolisthesis, lumbar region 11/27/2020    Class: Chronic   Spinal stenosis, lumbar region with neurogenic claudication 11/27/2020    Class: Chronic   Fusion of spine of lumbar region 11/27/2020   Severe obstructive sleep apnea 11/09/2018   Healthcare maintenance 11/09/2018   At risk for obstructive sleep apnea 04/16/2018   Acquired hypothyroidism 03/17/2018   DOE (dyspnea on exertion) 03/16/2018   NSTEMI (non-ST elevated myocardial infarction) (HCC) 08/18/2017   Right sided sciatica 07/10/2017   Right low back pain 07/10/2017  Chest pain 09/29/2011   Morbid obesity (HCC) 09/29/2011   DM type 2 (diabetes mellitus, type 2) (HCC) 09/29/2011   HTN (hypertension) 09/29/2011   Pneumonia, bacterial 08/07/2010   Incidental lung nodule, > 3mm and < 8mm 08/07/2010   Bronchitis 08/07/2010    THERAPY DIAG:  Pain in right hip  Unsteadiness on feet  Rationale for Evaluation and Treatment: Rehabilitation  REFERRING DIAG: Z96.641 (ICD-10-CM) - Status post total replacement of right hip   PERTINENT HISTORY: spinal stenosis, DM T2, NSTEMI  PRECAUTIONS/RESTRICTIONS:   R A THA 4/23  SUBJECTIVE:  Pt  presents to PT with reports of R hip stiffness, very little pain. Has been compliant with HEP with no adverse effect.   PAIN:  Are you having pain? Yes: NPRS scale: 2/10 Pain location: R thigh region Pain description: ache Aggravating factors: activity Relieving factors: rest  OBJECTIVE: (objective measures completed at initial evaluation unless otherwise dated)  PATIENT SURVEYS:  FOTO 51(64 predicted)   MUSCLE LENGTH: Hamstrings: Right 80 deg; Left 80 deg   POSTURE: No Significant postural limitations   PALPATION: deferred   LOWER EXTREMITY ROM:   A/PROM Right eval Left eval  Hip flexion 90/110d WNL  Hip extension 0/10d WNL  Hip abduction 30/30d WNL  Hip adduction      Hip internal rotation      Hip external rotation      Knee flexion WNL WNL  Knee extension WNL WNL  Ankle dorsiflexion      Ankle plantarflexion      Ankle inversion      Ankle eversion       (Blank rows = not tested)   LOWER EXTREMITY MMT:   MMT Right eval Left eval  Hip flexion 3 5  Hip extension 3+ 5  Hip abduction 3+ 5  Hip adduction      Hip internal rotation      Hip external rotation      Knee flexion 4 5  Knee extension 4 5  Ankle dorsiflexion      Ankle plantarflexion 4 5  Ankle inversion      Ankle eversion       (Blank rows = not tested)   LOWER EXTREMITY SPECIAL TESTS:  Deferred post-op   FUNCTIONAL TESTS:  5 times sit to stand: 18s arms crossed   GAIT: Distance walked: 153ftx2 Assistive device utilized: Quad cane small base Level of assistance: Complete Independence Comments: mild R lateral WS   TREATMENT: [facility] Adult PT Treatment:                                                DATE: 06/26/22 Therapeutic Exercise: NuStep lvl 5 LE only x 4 min while taking subjective Bridge 2x10  SLR (strap assist) 2x10 R STS no UE 2x10 Seated knee ext 2x10 B LE 10# Seated knee ext R only x 10 5# Step up R leading 2x10 8 in Lateral walk GTB 3x laps in // Standing hip abd/ext  2x10 GTB Standing mini squat wit UE support 2x10  TREATMENT 5/18: Therapeutic Exercise: NuStep L5 25m LE only while taking subjective and planning session with patient LTR - 20x Supine march - RTB - 2x10 ea Alternating clam GTB - 2x10 ea Bridge on ball - short lever arm - 3x10 SLR with strap assist - 3x10 Knee ext machine - 3x10 bil  PATIENT EDUCATION:  Education details: continue HEP Person educated: Patient Education method: Explanation Education comprehension: verbalized understanding and needs further education   HOME EXERCISE PROGRAM: Access Code: [email or web address] URL: [email or web address] Date: 06/22/2022 Prepared by: Alphonzo Severance  Exercises - Modified Thomas Stretch  - 2 x daily - 5 x weekly - 1 sets - 2 reps - 30s hold - Supine Bridge with Mini Swiss Ball Between Knees  - 2 x daily - 5 x weekly - 1 sets - 15 reps - Hooklying Single Leg Bent Knee Fallouts with Resistance  - 2 x daily - 5 x weekly - 1 sets - 15 reps - Standing Heel Raise with Chair Support  - 2 x daily - 5 x weekly - 1 sets - 15 reps - Standing [employer] at [employer]  - 2 x daily - 5 x weekly - 1 sets - 10 reps - Seated March with Resistance  - 1 x daily - 7 x weekly - 3 sets - 10 reps  TREATMENT 5/18:  Therapeutic Exercise: - nu-step L5 21m LE only while taking subjective and planning session with patient - LTR - 20x - supine march - RTB - 2x10 ea - Alternating clam GTB - 2x10 ea - bridge on ball - short lever arm - 3x10 - SLR with strap assist - 3x10 - knee ext machine - 3x10 bil   CLINICAL IMPRESSION: Pt was able to complete all prescribed exercises with no adverse effect or increase in pain. Therapy today focused on proximal hip strengthening and improving functional mobility post surgery. Pt is progressing well with therapy, will continue per POC as prescribed.    OBJECTIVE IMPAIRMENTS: Abnormal gait, decreased activity tolerance, decreased endurance, decreased mobility,  difficulty walking, decreased ROM, decreased strength, and impaired perceived functional ability.    ACTIVITY LIMITATIONS: carrying, lifting, sitting, standing, squatting, and stairs   PERSONAL FACTORS: Age, Behavior pattern, Fitness, and 1 comorbidity: DM  are also affecting patient's functional outcome.     GOALS: Goals reviewed with patient? No   SHORT TERM GOALS: Target date: 07/03/2022   Patient to demonstrate independence in HEP  Baseline: [id number] Goal status: INITIAL   LONG TERM GOALS: Target date: 07/17/2022   110d AROM R hip flexion, 10d extension Baseline:  A/PROM Right eval Left eval  Hip flexion 90/110d WNL  Hip extension 0/10d WNL  Hip abduction 30/30d WNL    Goal status: INITIAL   2.  Increase R hip strength to 4/5 Baseline:  MMT Right eval Left eval  Hip flexion 3 5  Hip extension 3+ 5  Hip abduction 3+ 5    Goal status: INITIAL   3.  Increase FOTO score to 64 Baseline: 51 Goal status: INITIAL   4.  220ft ambulation w/o need of AD Baseline: 124ft with LBQC Goal status: INITIAL         PLAN:   PT FREQUENCY: 1-2x/week   PT DURATION: 4 weeks   PLANNED INTERVENTIONS: Therapeutic exercises, Therapeutic activity, Neuromuscular re-education, Balance training, Gait training, Patient/Family education, Self Care, Joint mobilization, Stair training, Dry Needling, Manual therapy, and Re-evaluation   PLAN FOR NEXT SESSION: HEP review and update, manual techniques as appropriate, aerobic tasks, ROM and flexibility activities, strengthening and PREs, TPDN, gait and balance training as needed       Eloy End PT 06/26/2022, 9:09 AM

## 2022-06-27 NOTE — Therapy (Signed)
OUTPATIENT PHYSICAL THERAPY TREATMENT NOTE   Patient Name: [name] MRN: [id number] DOB:1960-11-23, 62 y.o., female Today's Date: 06/28/2022  PCP: Jackie Plum, MD    REFERRING PROVIDER: [name], MD   PT End of Session - 06/28/22 0919     Visit Number 4    Number of Visits 8    Date for PT Re-Evaluation 08/14/22    Authorization Type [employer] MCR    PT Start Time 0920    PT Stop Time 1000    PT Time Calculation (min) 40 min    Activity Tolerance Patient tolerated treatment well    Behavior During Therapy WFL for tasks assessed/performed              Past Medical History:  Diagnosis Date   ALLERGIC RHINITIS    Allergy to IVP dye    Arthritis    Coronary artery disease    Diabetes mellitus, type 2 (HCC)    History of tobacco abuse    Quit 2011   Hypertension    Hypertriglyceridemia    Hypothyroidism    NSTEMI (non-ST elevated myocardial infarction) (HCC) 09/30/2011   NO CAD by cath, ? coronary vasospasm   Obesity    Sleep apnea    Past Surgical History:  Procedure Laterality Date   BREAST BIOPSY Left 03/05/2022   [place] LT BREAST BX W LOC DEV 1ST LESION IMG BX SPEC US GUIDE 03/05/2022 [facility]   BREAST BIOPSY  04/25/2022   MM LT RADIOACTIVE SEED LOC MAMMO GUIDE 04/25/2022 [facility]   BREAST LUMPECTOMY WITH RADIOACTIVE SEED LOCALIZATION Left 04/26/2022   Procedure: LEFT BREAST LUMPECTOMY WITH RADIOACTIVE SEED LOCALIZATION;  Surgeon: Griselda Miner, MD;  Location: [facility];  Service: General;  Laterality: Left;   CORONARY STENT INTERVENTION N/A 08/19/2017   Procedure: CORONARY STENT INTERVENTION;  Surgeon: Yates Decamp, MD;  Location: [facility];  Service: Cardiovascular;  Laterality: N/A;   KNEE ARTHROSCOPY W/ MENISCAL REPAIR Left 2009   KNEE ARTHROSCOPY W/ MENISCAL REPAIR Right 2014   LEFT HEART CATH AND CORONARY ANGIOGRAPHY N/A 08/19/2017   Procedure: LEFT HEART CATH AND CORONARY ANGIOGRAPHY;   Surgeon: Yates Decamp, MD;  Location: [facility];  Service: Cardiovascular;  Laterality: N/A;   LEFT HEART CATHETERIZATION WITH CORONARY ANGIOGRAM N/A 09/30/2011   Procedure: LEFT HEART CATHETERIZATION WITH CORONARY ANGIOGRAM;  Surgeon: Kathleene Hazel, MD;  Location: [facility] CATH LAB;  Service: Cardiovascular;  Laterality: N/A;   LUMBAR DISC SURGERY  11/27/2020   L4-5   TOTAL HIP ARTHROPLASTY Right 05/28/2022   Procedure: RIGHT TOTAL HIP ARTHROPLASTY ANTERIOR APPROACH;  Surgeon: [name], MD;  Location: [facility];  Service: Orthopedics;  Laterality: Right;   TUBAL LIGATION     VESICOVAGINAL FISTULA CLOSURE W/ TAH     Patient Active Problem List   Diagnosis Date Noted   Status post total replacement of right hip 05/28/2022   Diabetes mellitus (HCC) 03/16/2021   Spondylolisthesis, lumbar region 11/27/2020    Class: Chronic   Spinal stenosis, lumbar region with neurogenic claudication 11/27/2020    Class: Chronic   Fusion of spine of lumbar region 11/27/2020   Severe obstructive sleep apnea 11/09/2018   Healthcare maintenance 11/09/2018   At risk for obstructive sleep apnea 04/16/2018   Acquired hypothyroidism 03/17/2018   DOE (dyspnea on exertion) 03/16/2018   NSTEMI (non-ST elevated myocardial infarction) (HCC) 08/18/2017   Right sided sciatica 07/10/2017   Right low back pain 07/10/2017  Chest pain 09/29/2011   Morbid obesity (HCC) 09/29/2011   DM type 2 (diabetes mellitus, type 2) (HCC) 09/29/2011   HTN (hypertension) 09/29/2011   Pneumonia, bacterial 08/07/2010   Incidental lung nodule, > 3mm and < 8mm 08/07/2010   Bronchitis 08/07/2010    THERAPY DIAG:  Pain in right hip  Unsteadiness on feet  Status post total replacement of right hip  Rationale for Evaluation and Treatment: Rehabilitation  REFERRING DIAG: Z96.641 (ICD-10-CM) - Status post total replacement of right hip   PERTINENT HISTORY: spinal stenosis, DM T2,  NSTEMI  PRECAUTIONS/RESTRICTIONS:   R A THA 4/23  SUBJECTIVE: Reports more stiffness than pain, stiffness loosens up with activity.  Has been able to ambulate w/o her cane.  PAIN:  Are you having pain? Yes: NPRS scale: 2/10 Pain location: R thigh region Pain description: ache Aggravating factors: activity Relieving factors: rest  OBJECTIVE: (objective measures completed at initial evaluation unless otherwise dated)  PATIENT SURVEYS:  FOTO 51(64 predicted)   MUSCLE LENGTH: Hamstrings: Right 80 deg; Left 80 deg   POSTURE: No Significant postural limitations   PALPATION: deferred   LOWER EXTREMITY ROM:   A/PROM Right eval Left eval  Hip flexion 90/110d WNL  Hip extension 0/10d WNL  Hip abduction 30/30d WNL  Hip adduction      Hip internal rotation      Hip external rotation      Knee flexion WNL WNL  Knee extension WNL WNL  Ankle dorsiflexion      Ankle plantarflexion      Ankle inversion      Ankle eversion       (Blank rows = not tested)   LOWER EXTREMITY MMT:   MMT Right eval Left eval  Hip flexion 3 5  Hip extension 3+ 5  Hip abduction 3+ 5  Hip adduction      Hip internal rotation      Hip external rotation      Knee flexion 4 5  Knee extension 4 5  Ankle dorsiflexion      Ankle plantarflexion 4 5  Ankle inversion      Ankle eversion       (Blank rows = not tested)   LOWER EXTREMITY SPECIAL TESTS:  Deferred post-op   FUNCTIONAL TESTS:  5 times sit to stand: 18s arms crossed   GAIT: Distance walked: 163ftx2 Assistive device utilized: Quad cane small base Level of assistance: Complete Independence Comments: mild R lateral WS   TREATMENT: [facility] Adult PT Treatment:                                                DATE: 06/28/22 Therapeutic Exercise: Nustep L2 8 min Supine hip flexor stretch 30s x2  Bridge with ball 15x Bridge against GTB 15x Supine hip fallouts GTB 15x B 15/15 unilaterally L clams in R S/L 15x GTB PF against wall  15x R SLR with QS 5x Opposite shoulder flexion/ hip extension 10/10 Tandem and retowalking at countertop 5 trips  Step downs 2 in block at counter 10x   [facility] Adult PT Treatment:  DATE: 06/26/22 Therapeutic Exercise: NuStep lvl 5 LE only x 4 min while taking subjective Bridge 2x10  SLR (strap assist) 2x10 R STS no UE 2x10 Seated knee ext 2x10 B LE 10# Seated knee ext R only x 10 5# Step up R leading 2x10 8 in Lateral walk GTB 3x laps in // Standing hip abd/ext 2x10 GTB Standing mini squat wit UE support 2x10  TREATMENT 5/18: Therapeutic Exercise: NuStep L5 34m LE only while taking subjective and planning session with patient LTR - 20x Supine march - RTB - 2x10 ea Alternating clam GTB - 2x10 ea Bridge on ball - short lever arm - 3x10 SLR with strap assist - 3x10 Knee ext machine - 3x10 bil   PATIENT EDUCATION:  Education details: continue HEP Person educated: Patient Education method: Explanation Education comprehension: verbalized understanding and needs further education   HOME EXERCISE PROGRAM: Access Code: [email or web address] URL: [email or web address] Date: 06/22/2022 Prepared by: Alphonzo Severance  Exercises - Modified Thomas Stretch  - 2 x daily - 5 x weekly - 1 sets - 2 reps - 30s hold - Supine Bridge with Mini Swiss Ball Between Knees  - 2 x daily - 5 x weekly - 1 sets - 15 reps - Hooklying Single Leg Bent Knee Fallouts with Resistance  - 2 x daily - 5 x weekly - 1 sets - 15 reps - Standing Heel Raise with Chair Support  - 2 x daily - 5 x weekly - 1 sets - 15 reps - Standing [employer] at [employer]  - 2 x daily - 5 x weekly - 1 sets - 10 reps - Seated March with Resistance  - 1 x daily - 7 x weekly - 3 sets - 10 reps  TREATMENT 5/18:  Therapeutic Exercise: - nu-step L5 60m LE only while taking subjective and planning session with patient - LTR - 20x - supine march - RTB - 2x10 ea - Alternating clam GTB - 2x10  ea - bridge on ball - short lever arm - 3x10 - SLR with strap assist - 3x10 - knee ext machine - 3x10 bil   CLINICAL IMPRESSION: Continues to progress with strength and function.  Now ambulating w/o need of AD but cautioned to have cane close by.  Stiffness with prolonged positioning remains.  SLR still painful after 5 reps.  Incorporated additional strengthening and balance tasks with UE/LE dissociation, added CKC eccentric strengthening and gait training.   OBJECTIVE IMPAIRMENTS: Abnormal gait, decreased activity tolerance, decreased endurance, decreased mobility, difficulty walking, decreased ROM, decreased strength, and impaired perceived functional ability.    ACTIVITY LIMITATIONS: carrying, lifting, sitting, standing, squatting, and stairs   PERSONAL FACTORS: Age, Behavior pattern, Fitness, and 1 comorbidity: DM  are also affecting patient's functional outcome.     GOALS: Goals reviewed with patient? No   SHORT TERM GOALS: Target date: 07/03/2022   Patient to demonstrate independence in HEP  Baseline: 4KEYT2X4 Goal status: INITIAL   LONG TERM GOALS: Target date: 07/17/2022   110d AROM R hip flexion, 10d extension Baseline:  A/PROM Right eval Left eval  Hip flexion 90/110d WNL  Hip extension 0/10d WNL  Hip abduction 30/30d WNL    Goal status: INITIAL   2.  Increase R hip strength to 4/5 Baseline:  MMT Right eval Left eval  Hip flexion 3 5  Hip extension 3+ 5  Hip abduction 3+ 5    Goal status: INITIAL   3.  Increase FOTO score to 64 Baseline: 51 Goal status: INITIAL  4.  274ft ambulation w/o need of AD Baseline: 177ft with LBQC Goal status: INITIAL         PLAN:   PT FREQUENCY: 1-2x/week   PT DURATION: 4 weeks   PLANNED INTERVENTIONS: Therapeutic exercises, Therapeutic activity, Neuromuscular re-education, Balance training, Gait training, Patient/Family education, Self Care, Joint mobilization, Stair training, Dry Needling, Manual therapy, and  Re-evaluation   PLAN FOR NEXT SESSION: HEP review and update, manual techniques as appropriate, aerobic tasks, ROM and flexibility activities, strengthening and PREs, TPDN, gait and balance training as needed       Hildred Laser PT 06/28/2022, 9:58 AM

## 2022-06-28 ENCOUNTER — Ambulatory Visit: Payer: Medicare HMO

## 2022-06-28 ENCOUNTER — Encounter: Payer: Self-pay | Admitting: Orthopaedic Surgery

## 2022-06-28 DIAGNOSIS — M25551 Pain in right hip: Secondary | ICD-10-CM

## 2022-06-28 DIAGNOSIS — R2681 Unsteadiness on feet: Secondary | ICD-10-CM | POA: Diagnosis not present

## 2022-06-28 DIAGNOSIS — Z96641 Presence of right artificial hip joint: Secondary | ICD-10-CM

## 2022-07-02 ENCOUNTER — Ambulatory Visit: Payer: Medicare HMO | Admitting: Physical Therapy

## 2022-07-05 NOTE — Therapy (Deleted)
OUTPATIENT PHYSICAL THERAPY TREATMENT NOTE   Patient Name: [name] MRN: [id number] DOB:[birth year], 62 y.o., female Today's Date: 07/05/2022  PCP: Jackie Plum, MD    REFERRING PROVIDER: [name], MD      Past Medical History:  Diagnosis Date   ALLERGIC RHINITIS    Allergy to IVP dye    Arthritis    Coronary artery disease    Diabetes mellitus, type 2 (HCC)    History of tobacco abuse    Quit 2011   Hypertension    Hypertriglyceridemia    Hypothyroidism    NSTEMI (non-ST elevated myocardial infarction) (HCC) 09/30/2011   NO CAD by cath, ? coronary vasospasm   Obesity    Sleep apnea    Past Surgical History:  Procedure Laterality Date   BREAST BIOPSY Left 03/05/2022   [place] LT BREAST BX W LOC DEV 1ST LESION IMG BX SPEC US GUIDE 03/05/2022 [facility]   BREAST BIOPSY  04/25/2022   MM LT RADIOACTIVE SEED LOC MAMMO GUIDE 04/25/2022 [facility]   BREAST LUMPECTOMY WITH RADIOACTIVE SEED LOCALIZATION Left 04/26/2022   Procedure: LEFT BREAST LUMPECTOMY WITH RADIOACTIVE SEED LOCALIZATION;  Surgeon: Griselda Miner, MD;  Location: [facility];  Service: General;  Laterality: Left;   CORONARY STENT INTERVENTION N/A 08/19/2017   Procedure: CORONARY STENT INTERVENTION;  Surgeon: Yates Decamp, MD;  Location: [facility];  Service: Cardiovascular;  Laterality: N/A;   KNEE ARTHROSCOPY W/ MENISCAL REPAIR Left 2009   KNEE ARTHROSCOPY W/ MENISCAL REPAIR Right 2014   LEFT HEART CATH AND CORONARY ANGIOGRAPHY N/A 08/19/2017   Procedure: LEFT HEART CATH AND CORONARY ANGIOGRAPHY;  Surgeon: Yates Decamp, MD;  Location: [facility];  Service: Cardiovascular;  Laterality: N/A;   LEFT HEART CATHETERIZATION WITH CORONARY ANGIOGRAM N/A 09/30/2011   Procedure: LEFT HEART CATHETERIZATION WITH CORONARY ANGIOGRAM;  Surgeon: Kathleene Hazel, MD;  Location: [facility] CATH [facility];  Service: Cardiovascular;  Laterality: N/A;   LUMBAR DISC SURGERY   11/27/2020   L4-5   TOTAL HIP ARTHROPLASTY Right 05/28/2022   Procedure: RIGHT TOTAL HIP ARTHROPLASTY ANTERIOR APPROACH;  Surgeon: [name], MD;  Location: [facility];  Service: Orthopedics;  Laterality: Right;   TUBAL LIGATION     VESICOVAGINAL FISTULA CLOSURE W/ TAH     Patient Active Problem List   Diagnosis Date Noted   Status post total replacement of right hip 05/28/2022   Diabetes mellitus (HCC) 03/16/2021   Spondylolisthesis, lumbar region 11/27/2020    Class: Chronic   Spinal stenosis, lumbar region with neurogenic claudication 11/27/2020    Class: Chronic   Fusion of spine of lumbar region 11/27/2020   Severe obstructive sleep apnea 11/09/2018   Healthcare maintenance 11/09/2018   At risk for obstructive sleep apnea 04/16/2018   Acquired hypothyroidism 03/17/2018   DOE (dyspnea on exertion) 03/16/2018   NSTEMI (non-ST elevated myocardial infarction) (HCC) 08/18/2017   Right sided sciatica 07/10/2017   Right low back pain 07/10/2017   Chest pain 09/29/2011   Morbid obesity (HCC) 09/29/2011   DM type 2 (diabetes mellitus, type 2) (HCC) 09/29/2011   HTN (hypertension) 09/29/2011   Pneumonia, bacterial 08/07/2010   Incidental lung nodule, > 3mm and < 8mm 08/07/2010   Bronchitis 08/07/2010    THERAPY DIAG:  No diagnosis found.  Rationale for Evaluation and Treatment: Rehabilitation  REFERRING DIAG: Z96.641 (ICD-10-CM) - Status post total replacement of right hip   PERTINENT HISTORY: spinal stenosis, DM T2, NSTEMI  PRECAUTIONS/RESTRICTIONS:  R A THA 4/23  SUBJECTIVE: Reports more stiffness than pain, stiffness loosens up with activity.  Has been able to ambulate w/o her cane.  PAIN:  Are you having pain? Yes: NPRS scale: 2/10 Pain location: R thigh region Pain description: ache Aggravating factors: activity Relieving factors: rest  OBJECTIVE: (objective measures completed at initial evaluation unless otherwise dated)  PATIENT SURVEYS:  FOTO  51(64 predicted)   MUSCLE LENGTH: Hamstrings: Right 80 deg; Left 80 deg   POSTURE: No Significant postural limitations   PALPATION: deferred   LOWER EXTREMITY ROM:   A/PROM Right eval Left eval  Hip flexion 90/110d WNL  Hip extension 0/10d WNL  Hip abduction 30/30d WNL  Hip adduction      Hip internal rotation      Hip external rotation      Knee flexion WNL WNL  Knee extension WNL WNL  Ankle dorsiflexion      Ankle plantarflexion      Ankle inversion      Ankle eversion       (Blank rows = not tested)   LOWER EXTREMITY MMT:   MMT Right eval Left eval  Hip flexion 3 5  Hip extension 3+ 5  Hip abduction 3+ 5  Hip adduction      Hip internal rotation      Hip external rotation      Knee flexion 4 5  Knee extension 4 5  Ankle dorsiflexion      Ankle plantarflexion 4 5  Ankle inversion      Ankle eversion       (Blank rows = not tested)   LOWER EXTREMITY SPECIAL TESTS:  Deferred post-op   FUNCTIONAL TESTS:  5 times sit to stand: 18s arms crossed   GAIT: Distance walked: 117ftx2 Assistive device utilized: Quad cane small base Level of assistance: Complete Independence Comments: mild R lateral WS   TREATMENT: [facility] Adult PT Treatment:                                                DATE: 06/28/22 Therapeutic Exercise: Nustep L2 8 min Supine hip flexor stretch 30s x2  Bridge with ball 15x Bridge against GTB 15x Supine hip fallouts GTB 15x B 15/15 unilaterally L clams in R S/L 15x GTB PF against wall 15x R SLR with QS 5x Opposite shoulder flexion/ hip extension 10/10 Tandem and retowalking at countertop 5 trips  Step downs 2 in block at counter 10x   [facility] Adult PT Treatment:                                                DATE: 06/26/22 Therapeutic Exercise: NuStep lvl 5 LE only x 4 min while taking subjective Bridge 2x10  SLR (strap assist) 2x10 R STS no UE 2x10 Seated knee ext 2x10 B LE 10# Seated knee ext R only x 10 5# Step up R leading 2x10  8 in Lateral walk GTB 3x laps in // Standing hip abd/ext 2x10 GTB Standing mini squat wit UE support 2x10  TREATMENT 5/18: Therapeutic Exercise: NuStep L5 80m LE only while taking subjective and planning session with patient LTR - 20x Supine march - RTB - 2x10 ea Alternating clam GTB -  2x10 ea Bridge on ball - short lever arm - 3x10 SLR with strap assist - 3x10 Knee ext machine - 3x10 bil   PATIENT EDUCATION:  Education details: continue HEP Person educated: Patient Education method: Explanation Education comprehension: verbalized understanding and needs further education   HOME EXERCISE PROGRAM: Access Code: [email or web address] URL: [email or web address] Date: 06/22/2022 Prepared by: Alphonzo Severance  Exercises - Modified Thomas Stretch  - 2 x daily - 5 x weekly - 1 sets - 2 reps - 30s hold - Supine Bridge with Mini Swiss Ball Between Knees  - 2 x daily - 5 x weekly - 1 sets - 15 reps - Hooklying Single Leg Bent Knee Fallouts with Resistance  - 2 x daily - 5 x weekly - 1 sets - 15 reps - Standing Heel Raise with Chair Support  - 2 x daily - 5 x weekly - 1 sets - 15 reps - Standing [employer] at [employer]  - 2 x daily - 5 x weekly - 1 sets - 10 reps - Seated March with Resistance  - 1 x daily - 7 x weekly - 3 sets - 10 reps  TREATMENT 5/18:  Therapeutic Exercise: - nu-step L5 54m LE only while taking subjective and planning session with patient - LTR - 20x - supine march - RTB - 2x10 ea - Alternating clam GTB - 2x10 ea - bridge on ball - short lever arm - 3x10 - SLR with strap assist - 3x10 - knee ext machine - 3x10 bil   CLINICAL IMPRESSION: Continues to progress with strength and function.  Now ambulating w/o need of AD but cautioned to have cane close by.  Stiffness with prolonged positioning remains.  SLR still painful after 5 reps.  Incorporated additional strengthening and balance tasks with UE/LE dissociation, added CKC eccentric strengthening and gait  training.   OBJECTIVE IMPAIRMENTS: Abnormal gait, decreased activity tolerance, decreased endurance, decreased mobility, difficulty walking, decreased ROM, decreased strength, and impaired perceived functional ability.    ACTIVITY LIMITATIONS: carrying, lifting, sitting, standing, squatting, and stairs   PERSONAL FACTORS: Age, Behavior pattern, Fitness, and 1 comorbidity: DM  are also affecting patient's functional outcome.     GOALS: Goals reviewed with patient? No   SHORT TERM GOALS: Target date: 07/03/2022   Patient to demonstrate independence in HEP  Baseline: 4KEYT2X4 Goal status: INITIAL   LONG TERM GOALS: Target date: 07/17/2022   110d AROM R hip flexion, 10d extension Baseline:  A/PROM Right eval Left eval  Hip flexion 90/110d WNL  Hip extension 0/10d WNL  Hip abduction 30/30d WNL    Goal status: INITIAL   2.  Increase R hip strength to 4/5 Baseline:  MMT Right eval Left eval  Hip flexion 3 5  Hip extension 3+ 5  Hip abduction 3+ 5    Goal status: INITIAL   3.  Increase FOTO score to 64 Baseline: 51 Goal status: INITIAL   4.  239ft ambulation w/o need of AD Baseline: 152ft with LBQC Goal status: INITIAL         PLAN:   PT FREQUENCY: 1-2x/week   PT DURATION: 4 weeks   PLANNED INTERVENTIONS: Therapeutic exercises, Therapeutic activity, Neuromuscular re-education, Balance training, Gait training, Patient/Family education, Self Care, Joint mobilization, Stair training, Dry Needling, Manual therapy, and Re-evaluation   PLAN FOR NEXT SESSION: HEP review and update, manual techniques as appropriate, aerobic tasks, ROM and flexibility activities, strengthening and PREs, TPDN, gait and balance training as needed  Farris Has Junious Ragone PT 07/05/2022, 11:41 AM

## 2022-07-08 ENCOUNTER — Ambulatory Visit: Payer: Medicare HMO

## 2022-07-15 ENCOUNTER — Ambulatory Visit (INDEPENDENT_AMBULATORY_CARE_PROVIDER_SITE_OTHER): Payer: Medicare HMO | Admitting: Orthopaedic Surgery

## 2022-07-15 ENCOUNTER — Encounter: Payer: Self-pay | Admitting: Orthopaedic Surgery

## 2022-07-15 DIAGNOSIS — Z96641 Presence of right artificial hip joint: Secondary | ICD-10-CM

## 2022-07-15 NOTE — Progress Notes (Signed)
The patient is now around 6 weeks status post a right total hip arthroplasty.  She says the seroma that she had is gone and she has good range of motion and strength.  She is an active 62 year old female and retired.  She is not needing to walk with assistive device and she feels great she states.  On exam her right operative hip moves smoothly and fluidly with no issues at all.  From my standpoint she can follow-up in 6 months unless there are issues.  At that visit we will have a standing likely pelvis and lateral of her right operative hip.

## 2022-07-17 ENCOUNTER — Ambulatory Visit: Payer: Medicare HMO | Attending: Orthopaedic Surgery

## 2022-07-17 DIAGNOSIS — M25551 Pain in right hip: Secondary | ICD-10-CM | POA: Insufficient documentation

## 2022-07-17 DIAGNOSIS — R2681 Unsteadiness on feet: Secondary | ICD-10-CM | POA: Insufficient documentation

## 2022-07-17 DIAGNOSIS — Z96641 Presence of right artificial hip joint: Secondary | ICD-10-CM | POA: Diagnosis not present

## 2022-07-17 NOTE — Therapy (Signed)
OUTPATIENT PHYSICAL THERAPY TREATMENT NOTE   Patient Name: [name] MRN: [id number] DOB:05/12/1960, 62 y.o., female Today's Date: 07/17/2022  PCP: Jackie Plum, MD    REFERRING PROVIDER: [name], MD   PT End of Session - 07/17/22 [phone]     Visit Number 5    Number of Visits 8    Date for PT Re-Evaluation 08/14/22    Authorization Type [employer] [facility]    PT Start Time 1191   arrived late   PT Stop Time 0911    PT Time Calculation (min) 38 min    Activity Tolerance Patient tolerated treatment well    Behavior During Therapy [facility] for tasks assessed/performed               Past Medical History:  Diagnosis Date   ALLERGIC RHINITIS    Allergy to IVP dye    Arthritis    Coronary artery disease    Diabetes mellitus, type 2 (HCC)    History of tobacco abuse    Quit 2011   Hypertension    Hypertriglyceridemia    Hypothyroidism    NSTEMI (non-ST elevated myocardial infarction) (HCC) 09/30/2011   NO CAD by cath, ? coronary vasospasm   Obesity    Sleep apnea    Past Surgical History:  Procedure Laterality Date   BREAST BIOPSY Left 03/05/2022   [place] LT BREAST BX W LOC DEV 1ST LESION IMG BX SPEC US GUIDE 03/05/2022 [facility]   BREAST BIOPSY  04/25/2022   MM LT RADIOACTIVE SEED LOC MAMMO GUIDE 04/25/2022 [facility]   BREAST LUMPECTOMY WITH RADIOACTIVE SEED LOCALIZATION Left 04/26/2022   Procedure: LEFT BREAST LUMPECTOMY WITH RADIOACTIVE SEED LOCALIZATION;  Surgeon: Griselda Miner, MD;  Location: [facility];  Service: General;  Laterality: Left;   CORONARY STENT INTERVENTION N/A 08/19/2017   Procedure: CORONARY STENT INTERVENTION;  Surgeon: Yates Decamp, MD;  Location: [facility];  Service: Cardiovascular;  Laterality: N/A;   KNEE ARTHROSCOPY W/ MENISCAL REPAIR Left 2009   KNEE ARTHROSCOPY W/ MENISCAL REPAIR Right 2014   LEFT HEART CATH AND CORONARY ANGIOGRAPHY N/A 08/19/2017   Procedure: LEFT HEART CATH AND CORONARY  ANGIOGRAPHY;  Surgeon: Yates Decamp, MD;  Location: [facility];  Service: Cardiovascular;  Laterality: N/A;   LEFT HEART CATHETERIZATION WITH CORONARY ANGIOGRAM N/A 09/30/2011   Procedure: LEFT HEART CATHETERIZATION WITH CORONARY ANGIOGRAM;  Surgeon: Kathleene Hazel, MD;  Location: [facility] CATH LAB;  Service: Cardiovascular;  Laterality: N/A;   LUMBAR DISC SURGERY  11/27/2020   L4-5   TOTAL HIP ARTHROPLASTY Right 05/28/2022   Procedure: RIGHT TOTAL HIP ARTHROPLASTY ANTERIOR APPROACH;  Surgeon: [name], MD;  Location: [facility];  Service: Orthopedics;  Laterality: Right;   TUBAL LIGATION     VESICOVAGINAL FISTULA CLOSURE W/ TAH     Patient Active Problem List   Diagnosis Date Noted   Status post total replacement of right hip 05/28/2022   Diabetes mellitus (HCC) 03/16/2021   Spondylolisthesis, lumbar region 11/27/2020    Class: Chronic   Spinal stenosis, lumbar region with neurogenic claudication 11/27/2020    Class: Chronic   Fusion of spine of lumbar region 11/27/2020   Severe obstructive sleep apnea 11/09/2018   Healthcare maintenance 11/09/2018   At risk for obstructive sleep apnea 04/16/2018   Acquired hypothyroidism 03/17/2018   DOE (dyspnea on exertion) 03/16/2018   NSTEMI (non-ST elevated myocardial infarction) (HCC) 08/18/2017   Right sided sciatica 07/10/2017   Right low  back pain 07/10/2017   Chest pain 09/29/2011   Morbid obesity (HCC) 09/29/2011   DM type 2 (diabetes mellitus, type 2) (HCC) 09/29/2011   HTN (hypertension) 09/29/2011   Pneumonia, bacterial 08/07/2010   Incidental lung nodule, > 3mm and < 8mm 08/07/2010   Bronchitis 08/07/2010    THERAPY DIAG:  Pain in right hip  Unsteadiness on feet  Status post total replacement of right hip  Rationale for Evaluation and Treatment: Rehabilitation  REFERRING DIAG: Z96.641 (ICD-10-CM) - Status post total replacement of right hip   PERTINENT HISTORY: spinal stenosis, DM T2,  NSTEMI  PRECAUTIONS/RESTRICTIONS:   R A THA 4/23  SUBJECTIVE: Pt presents to PT with reports of R hip stiffness, although no pain. Has been compliant with HEP. Had good f/u visit with Dr. Magnus Ivan yesterday.  PAIN:  Are you having pain? Yes: NPRS scale: 2/10 Pain location: R thigh region Pain description: ache Aggravating factors: activity Relieving factors: rest  OBJECTIVE: (objective measures completed at initial evaluation unless otherwise dated)  PATIENT SURVEYS:  FOTO 51(64 predicted)   MUSCLE LENGTH: Hamstrings: Right 80 deg; Left 80 deg   POSTURE: No Significant postural limitations   PALPATION: deferred   LOWER EXTREMITY ROM:   A/PROM Right eval Left eval  Hip flexion 90/110d WNL  Hip extension 0/10d WNL  Hip abduction 30/30d WNL  Hip adduction      Hip internal rotation      Hip external rotation      Knee flexion WNL WNL  Knee extension WNL WNL  Ankle dorsiflexion      Ankle plantarflexion      Ankle inversion      Ankle eversion       (Blank rows = not tested)   LOWER EXTREMITY MMT:   MMT Right eval Left eval  Hip flexion 3 5  Hip extension 3+ 5  Hip abduction 3+ 5  Hip adduction      Hip internal rotation      Hip external rotation      Knee flexion 4 5  Knee extension 4 5  Ankle dorsiflexion      Ankle plantarflexion 4 5  Ankle inversion      Ankle eversion       (Blank rows = not tested)   LOWER EXTREMITY SPECIAL TESTS:  Deferred post-op   FUNCTIONAL TESTS:  5 times sit to stand: 18s arms crossed   GAIT: Distance walked: 1102ftx2 Assistive device utilized: Quad cane small base Level of assistance: Complete Independence Comments: mild R lateral WS   TREATMENT: [facility] Adult PT Treatment:                                                DATE: 07/17/22 Therapeutic Exercise: Rec bike lvl 2 x 3 min while taking subjective Supine hip flexor stretch x 60" R Bridge with blue band 2x10 Supine clamshell 2x15 blue band Supine QS x 10 R  - 5" hold Supine SLR 2x10 R STS 2x10 - no UE support Seated knee ext 2x10 10# Seated knee flexion 2x10 20# Standing hip abd/ext 2x10 2# Standing marches 2# x 20 - 1 UE support Step ups 6in x 10 - R leading 1 UE  [facility] Adult PT Treatment:  DATE: 06/28/22 Therapeutic Exercise: Nustep L2 8 min Supine hip flexor stretch 30s x2  Bridge with ball 15x Bridge against GTB 15x Supine hip fallouts GTB 15x B 15/15 unilaterally L clams in R S/L 15x GTB PF against wall 15x R SLR with QS 5x Opposite shoulder flexion/ hip extension 10/10 Tandem and retowalking at countertop 5 trips  Step downs 2 in block at counter 10x   [facility] Adult PT Treatment:                                                DATE: 06/26/22 Therapeutic Exercise: NuStep lvl 5 LE only x 4 min while taking subjective Bridge 2x10  SLR (strap assist) 2x10 R STS no UE 2x10 Seated knee ext 2x10 B LE 10# Seated knee ext R only x 10 5# Step up R leading 2x10 8 in Lateral walk GTB 3x laps in // Standing hip abd/ext 2x10 GTB Standing mini squat wit UE support 2x10  TREATMENT 5/18: Therapeutic Exercise: NuStep L5 52m LE only while taking subjective and planning session with patient LTR - 20x Supine march - RTB - 2x10 ea Alternating clam GTB - 2x10 ea Bridge on ball - short lever arm - 3x10 SLR with strap assist - 3x10 Knee ext machine - 3x10 bil   PATIENT EDUCATION:  Education details: continue HEP Person educated: Patient Education method: Explanation Education comprehension: verbalized understanding and needs further education   HOME EXERCISE PROGRAM: Access Code: [email or web address] URL: [email or web address] Date: 06/22/2022 Prepared by: Alphonzo Severance  Exercises - Modified Thomas Stretch  - 2 x daily - 5 x weekly - 1 sets - 2 reps - 30s hold - Supine Bridge with Mini Swiss Ball Between Knees  - 2 x daily - 5 x weekly - 1 sets - 15 reps - Hooklying Single Leg Bent  Knee Fallouts with Resistance  - 2 x daily - 5 x weekly - 1 sets - 15 reps - Standing Heel Raise with Chair Support  - 2 x daily - 5 x weekly - 1 sets - 15 reps - Standing [employer] at [employer]  - 2 x daily - 5 x weekly - 1 sets - 10 reps - Seated March with Resistance  - 1 x daily - 7 x weekly - 3 sets - 10 reps  CLINICAL IMPRESSION: Pt was able to complete all prescribed exercises with no adverse effect or increase in pain. Therapy today focused on proximal hip strengthening and improving functional mobility post surgery. Pt is progressing well with therapy, will continue per POC as prescribed.    OBJECTIVE IMPAIRMENTS: Abnormal gait, decreased activity tolerance, decreased endurance, decreased mobility, difficulty walking, decreased ROM, decreased strength, and impaired perceived functional ability.    ACTIVITY LIMITATIONS: carrying, lifting, sitting, standing, squatting, and stairs   PERSONAL FACTORS: Age, Behavior pattern, Fitness, and 1 comorbidity: DM  are also affecting patient's functional outcome.     GOALS: Goals reviewed with patient? No   SHORT TERM GOALS: Target date: 07/03/2022   Patient to demonstrate independence in HEP  Baseline: 4KEYT2X4 Goal status: INITIAL   LONG TERM GOALS: Target date: 07/17/2022   110d AROM R hip flexion, 10d extension Baseline:  A/PROM Right eval Left eval  Hip flexion 90/110d WNL  Hip extension 0/10d WNL  Hip abduction 30/30d WNL    Goal status: INITIAL   2.  Increase R hip strength to 4/5 Baseline:  MMT Right eval Left eval  Hip flexion 3 5  Hip extension 3+ 5  Hip abduction 3+ 5    Goal status: INITIAL   3.  Increase FOTO score to 64 Baseline: 51 Goal status: INITIAL   4.  212ft ambulation w/o need of AD Baseline: 160ft with LBQC Goal status: INITIAL         PLAN:   PT FREQUENCY: 1-2x/week   PT DURATION: 4 weeks   PLANNED INTERVENTIONS: Therapeutic exercises, Therapeutic activity, Neuromuscular  re-education, Balance training, Gait training, Patient/Family education, Self Care, Joint mobilization, Stair training, Dry Needling, Manual therapy, and Re-evaluation   PLAN FOR NEXT SESSION: HEP review and update, manual techniques as appropriate, aerobic tasks, ROM and flexibility activities, strengthening and PREs, TPDN, gait and balance training as needed       Eloy End PT 07/17/2022, 9:11 AM

## 2022-07-19 ENCOUNTER — Encounter: Payer: Self-pay | Admitting: Physical Therapy

## 2022-07-19 ENCOUNTER — Ambulatory Visit: Payer: Medicare HMO | Admitting: Physical Therapy

## 2022-07-19 DIAGNOSIS — Z96641 Presence of right artificial hip joint: Secondary | ICD-10-CM | POA: Diagnosis not present

## 2022-07-19 DIAGNOSIS — R2681 Unsteadiness on feet: Secondary | ICD-10-CM

## 2022-07-19 DIAGNOSIS — M25551 Pain in right hip: Secondary | ICD-10-CM | POA: Diagnosis not present

## 2022-07-19 NOTE — Therapy (Signed)
OUTPATIENT PHYSICAL THERAPY TREATMENT NOTE   Patient Name: [name] MRN: [id number] DOB:[birth year], 62 y.o., female Today's Date: 07/19/2022  PCP: Jackie Plum, MD    REFERRING PROVIDER: [name], MD   PT End of Session - 07/19/22 [phone]     Visit Number 6    Number of Visits 8    Date for PT Re-Evaluation 08/14/22    Authorization Type [employer] MCR    PT Start Time 0831    PT Stop Time 0912    PT Time Calculation (min) 41 min    Activity Tolerance Patient tolerated treatment well    Behavior During Therapy [facility] for tasks assessed/performed               Past Medical History:  Diagnosis Date   ALLERGIC RHINITIS    Allergy to IVP dye    Arthritis    Coronary artery disease    Diabetes mellitus, type 2 (HCC)    History of tobacco abuse    Quit 2011   Hypertension    Hypertriglyceridemia    Hypothyroidism    NSTEMI (non-ST elevated myocardial infarction) (HCC) 09/30/2011   NO CAD by cath, ? coronary vasospasm   Obesity    Sleep apnea    Past Surgical History:  Procedure Laterality Date   BREAST BIOPSY Left 03/05/2022   [place] LT BREAST BX W LOC DEV 1ST LESION IMG BX SPEC US GUIDE 03/05/2022 [facility]   BREAST BIOPSY  04/25/2022   MM LT RADIOACTIVE SEED LOC MAMMO GUIDE 04/25/2022 [facility]   BREAST LUMPECTOMY WITH RADIOACTIVE SEED LOCALIZATION Left 04/26/2022   Procedure: LEFT BREAST LUMPECTOMY WITH RADIOACTIVE SEED LOCALIZATION;  Surgeon: Griselda Miner, MD;  Location: [facility];  Service: General;  Laterality: Left;   CORONARY STENT INTERVENTION N/A 08/19/2017   Procedure: CORONARY STENT INTERVENTION;  Surgeon: Yates Decamp, MD;  [facility]: [facility];  Service: Cardiovascular;  Laterality: N/A;   KNEE ARTHROSCOPY W/ MENISCAL REPAIR Left 2009   KNEE ARTHROSCOPY W/ MENISCAL REPAIR Right 2014   LEFT HEART CATH AND CORONARY ANGIOGRAPHY N/A 08/19/2017   Procedure: LEFT HEART CATH AND CORONARY ANGIOGRAPHY;   Surgeon: Yates Decamp, MD;  [facility]: [facility];  Service: Cardiovascular;  Laterality: N/A;   LEFT HEART CATHETERIZATION WITH CORONARY ANGIOGRAM N/A 09/30/2011   Procedure: LEFT HEART CATHETERIZATION WITH CORONARY ANGIOGRAM;  Surgeon: Kathleene Hazel, MD;  Location: [facility] CATH LAB;  Service: Cardiovascular;  Laterality: N/A;   LUMBAR DISC SURGERY  11/27/2020   L4-5   TOTAL HIP ARTHROPLASTY Right 05/28/2022   Procedure: RIGHT TOTAL HIP ARTHROPLASTY ANTERIOR APPROACH;  Surgeon: [name], MD;  Location: [facility];  Service: Orthopedics;  Laterality: Right;   TUBAL LIGATION     VESICOVAGINAL FISTULA CLOSURE W/ TAH     Patient Active Problem List   Diagnosis Date Noted   Status post total replacement of right hip 05/28/2022   Diabetes mellitus (HCC) 03/16/2021   Spondylolisthesis, lumbar region 11/27/2020    Class: Chronic   Spinal stenosis, lumbar region with neurogenic claudication 11/27/2020    Class: Chronic   Fusion of spine of lumbar region 11/27/2020   Severe obstructive sleep apnea 11/09/2018   Healthcare maintenance 11/09/2018   At risk for obstructive sleep apnea 04/16/2018   Acquired hypothyroidism 03/17/2018   DOE (dyspnea on exertion) 03/16/2018   NSTEMI (non-ST elevated myocardial infarction) (HCC) 08/18/2017   Right sided sciatica 07/10/2017   Right low back pain 07/10/2017  Chest pain 09/29/2011   Morbid obesity (HCC) 09/29/2011   DM type 2 (diabetes mellitus, type 2) (HCC) 09/29/2011   HTN (hypertension) 09/29/2011   Pneumonia, bacterial 08/07/2010   Incidental lung nodule, > 3mm and < 8mm 08/07/2010   Bronchitis 08/07/2010    THERAPY DIAG:  Pain in right hip  Unsteadiness on feet  Status post total replacement of right hip  Rationale for Evaluation and Treatment: Rehabilitation  REFERRING DIAG: Z96.641 (ICD-10-CM) - Status post total replacement of right hip   PERTINENT HISTORY: spinal stenosis, DM T2,  NSTEMI  PRECAUTIONS/RESTRICTIONS:   R A THA 4/23  SUBJECTIVE: Pt reports that she feels things are going well with her hip.  PAIN:  Are you having pain? Yes: NPRS scale: 2/10 Pain location: R thigh region Pain description: ache Aggravating factors: activity Relieving factors: rest  OBJECTIVE: (objective measures completed at initial evaluation unless otherwise dated)  PATIENT SURVEYS:  FOTO 51(64 predicted)   MUSCLE LENGTH: Hamstrings: Right 80 deg; Left 80 deg   POSTURE: No Significant postural limitations   PALPATION: deferred   LOWER EXTREMITY ROM:   A/PROM Right eval Left eval  Hip flexion 90/110d WNL  Hip extension 0/10d WNL  Hip abduction 30/30d WNL  Hip adduction      Hip internal rotation      Hip external rotation      Knee flexion WNL WNL  Knee extension WNL WNL  Ankle dorsiflexion      Ankle plantarflexion      Ankle inversion      Ankle eversion       (Blank rows = not tested)   LOWER EXTREMITY MMT:   MMT Right eval Left eval  Hip flexion 3 5  Hip extension 3+ 5  Hip abduction 3+ 5  Hip adduction      Hip internal rotation      Hip external rotation      Knee flexion 4 5  Knee extension 4 5  Ankle dorsiflexion      Ankle plantarflexion 4 5  Ankle inversion      Ankle eversion       (Blank rows = not tested)   LOWER EXTREMITY SPECIAL TESTS:  Deferred post-op   FUNCTIONAL TESTS:  5 times sit to stand: 18s arms crossed   GAIT: Distance walked: 137ftx2 Assistive device utilized: Quad cane small base Level of assistance: Complete Independence Comments: mild R lateral WS   TREATMENT: [facility] Adult PT Treatment:                                                DATE: 07/19/22 Therapeutic Exercise: Rec bike lvl 2 x 5 min while taking subjective Supine hip flexor stretch x 60" R Bridge with blue band 2x10 Supine clamshell 2x15 blue band Supine SLR 2x10 R STS 2x10 - no UE support - with OH Seated knee ext 2x10 10# Seated knee flexion  2x10 20# Step ups 6in x 10 - R leading 1 UE Hurdle walking in // alternate leads - moving from bil UE support to no UE support Slow march in // no UE  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  [facility] Adult PT Treatment:  DATE: 06/28/22 Therapeutic Exercise: Nustep L2 8 min Supine hip flexor stretch 30s x2  Bridge with ball 15x Bridge against GTB 15x Supine hip fallouts GTB 15x B 15/15 unilaterally L clams in R S/L 15x GTB PF against wall 15x R SLR with QS 5x Opposite shoulder flexion/ hip extension 10/10 Tandem and retowalking at countertop 5 trips  Step downs 2 in block at counter 10x   [facility] Adult PT Treatment:                                                DATE: 06/26/22 Therapeutic Exercise: NuStep lvl 5 LE only x 4 min while taking subjective Bridge 2x10  SLR (strap assist) 2x10 R STS no UE 2x10 Seated knee ext 2x10 B LE 10# Seated knee ext R only x 10 5# Step up R leading 2x10 8 in Lateral walk GTB 3x laps in // Standing hip abd/ext 2x10 GTB Standing mini squat wit UE support 2x10  TREATMENT 5/18: Therapeutic Exercise: NuStep L5 88m LE only while taking subjective and planning session with patient LTR - 20x Supine march - RTB - 2x10 ea Alternating clam GTB - 2x10 ea Bridge on ball - short lever arm - 3x10 SLR with strap assist - 3x10 Knee ext machine - 3x10 bil   PATIENT EDUCATION:  Education details: continue HEP Person educated: Patient Education method: Explanation Education comprehension: verbalized understanding and needs further education   HOME EXERCISE PROGRAM: Access Code: [email or web address] URL: [email or web address] Date: 06/22/2022 Prepared by: Alphonzo Severance  Exercises - Modified Thomas Stretch  - 2 x daily - 5 x weekly - 1 sets - 2 reps - 30s hold - Supine Bridge with Mini Swiss Ball Between Knees  - 2 x daily - 5 x weekly - 1 sets - 15 reps - Hooklying Single Leg Bent Knee Fallouts  with Resistance  - 2 x daily - 5 x weekly - 1 sets - 15 reps - Standing Heel Raise with Chair Support  - 2 x daily - 5 x weekly - 1 sets - 15 reps - Standing [employer] at [employer]  - 2 x daily - 5 x weekly - 1 sets - 10 reps - Seated March with Resistance  - 1 x daily - 7 x weekly - 3 sets - 10 reps  CLINICAL IMPRESSION: Shanan tolerated session well with no adverse reaction.  Added in hurdle walking and slow march to emphasize equal times in stance phase and normalize gait.  Overall progressing as expected.   OBJECTIVE IMPAIRMENTS: Abnormal gait, decreased activity tolerance, decreased endurance, decreased mobility, difficulty walking, decreased ROM, decreased strength, and impaired perceived functional ability.    ACTIVITY LIMITATIONS: carrying, lifting, sitting, standing, squatting, and stairs   PERSONAL FACTORS: Age, Behavior pattern, Fitness, and 1 comorbidity: DM  are also affecting patient's functional outcome.     GOALS: Goals reviewed with patient? No   SHORT TERM GOALS: Target date: 07/03/2022   Patient to demonstrate independence in HEP  Baseline: 4KEYT2X4 Goal status: INITIAL   LONG TERM GOALS: Target date: 07/17/2022   110d AROM R hip flexion, 10d extension Baseline:  A/PROM Right eval Left eval  Hip flexion 90/110d WNL  Hip extension 0/10d WNL  Hip abduction 30/30d WNL    Goal status: INITIAL   2.  Increase R hip strength to 4/5 Baseline:  MMT Right  eval Left eval  Hip flexion 3 5  Hip extension 3+ 5  Hip abduction 3+ 5    Goal status: INITIAL   3.  Increase FOTO score to 64 Baseline: 51 Goal status: INITIAL   4.  239ft ambulation w/o need of AD Baseline: 151ft with LBQC Goal status: INITIAL         PLAN:   PT FREQUENCY: 1-2x/week   PT DURATION: 4 weeks   PLANNED INTERVENTIONS: Therapeutic exercises, Therapeutic activity, Neuromuscular re-education, Balance training, Gait training, Patient/Family education, Self Care, Joint  mobilization, Stair training, Dry Needling, Manual therapy, and Re-evaluation   PLAN FOR NEXT SESSION: HEP review and update, manual techniques as appropriate, aerobic tasks, ROM and flexibility activities, strengthening and PREs, TPDN, gait and balance training as needed       Kimberlee Nearing Katya Rolston PT 07/19/2022, 9:15 AM

## 2022-07-22 ENCOUNTER — Ambulatory Visit: Payer: Medicare HMO

## 2022-07-22 DIAGNOSIS — Z96641 Presence of right artificial hip joint: Secondary | ICD-10-CM | POA: Diagnosis not present

## 2022-07-22 DIAGNOSIS — R2681 Unsteadiness on feet: Secondary | ICD-10-CM

## 2022-07-22 DIAGNOSIS — M25551 Pain in right hip: Secondary | ICD-10-CM | POA: Diagnosis not present

## 2022-07-22 NOTE — Therapy (Signed)
OUTPATIENT PHYSICAL THERAPY TREATMENT NOTE   Patient Name: [name] MRN: [id number] DOB:10-16-1960, 62 y.o., female Today's Date: 07/22/2022  PCP: Jackie Plum, MD    REFERRING PROVIDER: [name], MD   PT End of Session - 07/22/22 0830     Visit Number 7    Number of Visits 8    Date for PT Re-Evaluation 08/14/22    Authorization Type [employer] MCR    PT Start Time 0830    PT Stop Time 0909    PT Time Calculation (min) 39 min    Activity Tolerance Patient tolerated treatment well    Behavior During Therapy [facility] for tasks assessed/performed                Past Medical History:  Diagnosis Date   ALLERGIC RHINITIS    Allergy to IVP dye    Arthritis    Coronary artery disease    Diabetes mellitus, type 2 (HCC)    History of tobacco abuse    Quit 2011   Hypertension    Hypertriglyceridemia    Hypothyroidism    NSTEMI (non-ST elevated myocardial infarction) (HCC) 09/30/2011   NO CAD by cath, ? coronary vasospasm   Obesity    Sleep apnea    Past Surgical History:  Procedure Laterality Date   BREAST BIOPSY Left 03/05/2022   [place] LT BREAST BX W LOC DEV 1ST LESION IMG BX SPEC US GUIDE 03/05/2022 [facility]   BREAST BIOPSY  04/25/2022   MM LT RADIOACTIVE SEED LOC MAMMO GUIDE 04/25/2022 [facility]   BREAST LUMPECTOMY WITH RADIOACTIVE SEED LOCALIZATION Left 04/26/2022   Procedure: LEFT BREAST LUMPECTOMY WITH RADIOACTIVE SEED LOCALIZATION;  Surgeon: Griselda Miner, MD;  Location: [facility];  Service: General;  Laterality: Left;   CORONARY STENT INTERVENTION N/A 08/19/2017   Procedure: CORONARY STENT INTERVENTION;  Surgeon: Yates Decamp, MD;  [facility]: [facility];  Service: Cardiovascular;  Laterality: N/A;   KNEE ARTHROSCOPY W/ MENISCAL REPAIR Left 2009   KNEE ARTHROSCOPY W/ MENISCAL REPAIR Right 2014   LEFT HEART CATH AND CORONARY ANGIOGRAPHY N/A 08/19/2017   Procedure: LEFT HEART CATH AND CORONARY  ANGIOGRAPHY;  Surgeon: Yates Decamp, MD;  [facility]: [facility];  Service: Cardiovascular;  Laterality: N/A;   LEFT HEART CATHETERIZATION WITH CORONARY ANGIOGRAM N/A 09/30/2011   Procedure: LEFT HEART CATHETERIZATION WITH CORONARY ANGIOGRAM;  Surgeon: Kathleene Hazel, MD;  Location: [facility] CATH LAB;  Service: Cardiovascular;  Laterality: N/A;   LUMBAR DISC SURGERY  11/27/2020   L4-5   TOTAL HIP ARTHROPLASTY Right 05/28/2022   Procedure: RIGHT TOTAL HIP ARTHROPLASTY ANTERIOR APPROACH;  Surgeon: [name], MD;  Location: [facility];  Service: Orthopedics;  Laterality: Right;   TUBAL LIGATION     VESICOVAGINAL FISTULA CLOSURE W/ TAH     Patient Active Problem List   Diagnosis Date Noted   Status post total replacement of right hip 05/28/2022   Diabetes mellitus (HCC) 03/16/2021   Spondylolisthesis, lumbar region 11/27/2020    Class: Chronic   Spinal stenosis, lumbar region with neurogenic claudication 11/27/2020    Class: Chronic   Fusion of spine of lumbar region 11/27/2020   Severe obstructive sleep apnea 11/09/2018   Healthcare maintenance 11/09/2018   At risk for obstructive sleep apnea 04/16/2018   Acquired hypothyroidism 03/17/2018   DOE (dyspnea on exertion) 03/16/2018   NSTEMI (non-ST elevated myocardial infarction) (HCC) 08/18/2017   Right sided sciatica 07/10/2017   Right low back pain  07/10/2017   Chest pain 09/29/2011   Morbid obesity (HCC) 09/29/2011   DM type 2 (diabetes mellitus, type 2) (HCC) 09/29/2011   HTN (hypertension) 09/29/2011   Pneumonia, bacterial 08/07/2010   Incidental lung nodule, > 3mm and < 8mm 08/07/2010   Bronchitis 08/07/2010    THERAPY DIAG:  Pain in right hip  Unsteadiness on feet  Rationale for Evaluation and Treatment: Rehabilitation  REFERRING DIAG: Z96.641 (ICD-10-CM) - Status post total replacement of right hip   PERTINENT HISTORY: spinal stenosis, DM T2, NSTEMI  PRECAUTIONS/RESTRICTIONS:   R A THA  4/23  SUBJECTIVE: Pt presents to PT with reports of R quad stiffness. Has been compliant with HEP with no adverse effect.   PAIN:  Are you having pain? Yes: NPRS scale: 2/10 Pain location: R thigh region Pain description: ache Aggravating factors: activity Relieving factors: rest  OBJECTIVE: (objective measures completed at initial evaluation unless otherwise dated)  PATIENT SURVEYS:  FOTO 51(64 predicted)   MUSCLE LENGTH: Hamstrings: Right 80 deg; Left 80 deg   POSTURE: No Significant postural limitations   PALPATION: deferred   LOWER EXTREMITY ROM:   A/PROM Right eval Left eval  Hip flexion 90/110d WNL  Hip extension 0/10d WNL  Hip abduction 30/30d WNL  Hip adduction      Hip internal rotation      Hip external rotation      Knee flexion WNL WNL  Knee extension WNL WNL  Ankle dorsiflexion      Ankle plantarflexion      Ankle inversion      Ankle eversion       (Blank rows = not tested)   LOWER EXTREMITY MMT:   MMT Right eval Left eval  Hip flexion 3 5  Hip extension 3+ 5  Hip abduction 3+ 5  Hip adduction      Hip internal rotation      Hip external rotation      Knee flexion 4 5  Knee extension 4 5  Ankle dorsiflexion      Ankle plantarflexion 4 5  Ankle inversion      Ankle eversion       (Blank rows = not tested)   LOWER EXTREMITY SPECIAL TESTS:  Deferred post-op   FUNCTIONAL TESTS:  5 times sit to stand: 18s arms crossed   GAIT: Distance walked: 174ftx2 Assistive device utilized: Quad cane small base Level of assistance: Complete Independence Comments: mild R lateral WS   TREATMENT: [facility] Adult PT Treatment:                                                DATE: 07/22/22 Therapeutic Exercise: Rec bike lvl 5 x 5 min while taking subjective Seated knee ext 2x10 10# Seated knee flex 2x10 20# Supine hip flexor stretch x 60" R Supine SLR 2x10 R Lateral walk RTB x 3 laps in // Standing hip abd/ext 2x10 RTB Step ups 8in 2x10 - R leading  1 UE 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 3x15 Hurdle walking in // alternate leads - moving from bil UE support to no UE support Slow march in // no UE x 2 laps Tandem walk x 3 laps in //  [facility] Adult PT Treatment:  DATE: 07/19/22 Therapeutic Exercise: Rec bike lvl 2 x 5 min while taking subjective Supine hip flexor stretch x 60" R Bridge with blue band 2x10 Supine clamshell 2x15 blue band Supine SLR 2x10 R STS 2x10 - no UE support - with OH Seated knee ext 2x10 10# Seated knee flexion 2x10 20# Step ups 6in x 10 - R leading 1 UE Hurdle walking in // alternate leads - moving from bil UE support to no UE support Slow march in // no UE 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  [facility] Adult PT Treatment:                                                DATE: 06/28/22 Therapeutic Exercise: Nustep L2 8 min Supine hip flexor stretch 30s x2  Bridge with ball 15x Bridge against GTB 15x Supine hip fallouts GTB 15x B 15/15 unilaterally L clams in R S/L 15x GTB PF against wall 15x R SLR with QS 5x Opposite shoulder flexion/ hip extension 10/10 Tandem and retowalking at countertop 5 trips  Step downs 2 in block at counter 10x   [facility] Adult PT Treatment:                                                DATE: 06/26/22 Therapeutic Exercise: NuStep lvl 5 LE only x 4 min while taking subjective Bridge 2x10  SLR (strap assist) 2x10 R STS no UE 2x10 Seated knee ext 2x10 B LE 10# Seated knee ext R only x 10 5# Step up R leading 2x10 8 in Lateral walk GTB 3x laps in // Standing hip abd/ext 2x10 GTB Standing mini squat wit UE support 2x10  TREATMENT 5/18: Therapeutic Exercise: NuStep L5 44m LE only while taking subjective and planning session with patient LTR - 20x Supine march - RTB - 2x10 ea Alternating clam GTB - 2x10 ea Bridge on ball - short lever arm - 3x10 SLR with strap assist - 3x10 Knee ext machine - 3x10 bil   PATIENT  EDUCATION:  Education details: continue HEP Person educated: Patient Education method: Explanation Education comprehension: verbalized understanding and needs further education   HOME EXERCISE PROGRAM: Access Code: [email or web address] URL: [email or web address] Date: 06/22/2022 Prepared by: Alphonzo Severance  Exercises - Modified Thomas Stretch  - 2 x daily - 5 x weekly - 1 sets - 2 reps - 30s hold - Supine Bridge with Mini Swiss Ball Between Knees  - 2 x daily - 5 x weekly - 1 sets - 15 reps - Hooklying Single Leg Bent Knee Fallouts with Resistance  - 2 x daily - 5 x weekly - 1 sets - 15 reps - Standing Heel Raise with Chair Support  - 2 x daily - 5 x weekly - 1 sets - 15 reps - Standing [employer] at [employer]  - 2 x daily - 5 x weekly - 1 sets - 10 reps - Seated March with Resistance  - 1 x daily - 7 x weekly - 3 sets - 10 reps  CLINICAL IMPRESSION: Pt was able to complete all prescribed exercises with no adverse effect or increase in pain. Therapy today focused on proximal hip strengthening and improving functional mobility post surgery. Increasing balance challenges with  reducing UE assistance. Pt is progressing well with therapy, will continue per POC as prescribed.    OBJECTIVE IMPAIRMENTS: Abnormal gait, decreased activity tolerance, decreased endurance, decreased mobility, difficulty walking, decreased ROM, decreased strength, and impaired perceived functional ability.    ACTIVITY LIMITATIONS: carrying, lifting, sitting, standing, squatting, and stairs   PERSONAL FACTORS: Age, Behavior pattern, Fitness, and 1 comorbidity: DM  are also affecting patient's functional outcome.     GOALS: Goals reviewed with patient? No   SHORT TERM GOALS: Target date: 07/03/2022   Patient to demonstrate independence in HEP  Baseline: 4KEYT2X4 Goal status: INITIAL   LONG TERM GOALS: Target date: 07/17/2022   110d AROM R hip flexion, 10d extension Baseline:  A/PROM Right eval  Left eval  Hip flexion 90/110d WNL  Hip extension 0/10d WNL  Hip abduction 30/30d WNL    Goal status: INITIAL   2.  Increase R hip strength to 4/5 Baseline:  MMT Right eval Left eval  Hip flexion 3 5  Hip extension 3+ 5  Hip abduction 3+ 5    Goal status: INITIAL   3.  Increase FOTO score to 64 Baseline: 51 Goal status: INITIAL   4.  244ft ambulation w/o need of AD Baseline: 118ft with LBQC Goal status: INITIAL         PLAN:   PT FREQUENCY: 1-2x/week   PT DURATION: 4 weeks   PLANNED INTERVENTIONS: Therapeutic exercises, Therapeutic activity, Neuromuscular re-education, Balance training, Gait training, Patient/Family education, Self Care, Joint mobilization, Stair training, Dry Needling, Manual therapy, and Re-evaluation   PLAN FOR NEXT SESSION: HEP review and update, manual techniques as appropriate, aerobic tasks, ROM and flexibility activities, strengthening and PREs, TPDN, gait and balance training as needed       Eloy End PT 07/22/2022, 9:09 AM

## 2022-07-24 ENCOUNTER — Ambulatory Visit: Payer: Medicare HMO

## 2022-07-25 NOTE — Therapy (Signed)
OUTPATIENT PHYSICAL THERAPY TREATMENT NOTE   Patient Name: [name] MRN: [id number] DOB:07-02-1960, 62 y.o., female Today's Date: 07/29/2022  PCP: Jackie Plum, MD    REFERRING PROVIDER: [name], MD   PT End of Session - 07/29/22 0827     Visit Number 8    Number of Visits 13    Date for PT Re-Evaluation 08/14/22    Authorization Type [employer] MCR    Authorization Time Period Approved 12 visits 06/19/22-08/17/22    Authorization - Visit Number 7    Authorization - Number of Visits 12    PT Start Time 0830    PT Stop Time 0908    PT Time Calculation (min) 38 min    Activity Tolerance Patient tolerated treatment well    Behavior During Therapy WFL for tasks assessed/performed                 Past Medical History:  Diagnosis Date   ALLERGIC RHINITIS    Allergy to IVP dye    Arthritis    Coronary artery disease    Diabetes mellitus, type 2 (HCC)    History of tobacco abuse    Quit 2011   Hypertension    Hypertriglyceridemia    Hypothyroidism    NSTEMI (non-ST elevated myocardial infarction) (HCC) 09/30/2011   NO CAD by cath, ? coronary vasospasm   Obesity    Sleep apnea    Past Surgical History:  Procedure Laterality Date   BREAST BIOPSY Left 03/05/2022   [place] LT BREAST BX W LOC DEV 1ST LESION IMG BX SPEC US GUIDE 03/05/2022 [facility]   BREAST BIOPSY  04/25/2022   MM LT RADIOACTIVE SEED LOC MAMMO GUIDE 04/25/2022 [facility]   BREAST LUMPECTOMY WITH RADIOACTIVE SEED LOCALIZATION Left 04/26/2022   Procedure: LEFT BREAST LUMPECTOMY WITH RADIOACTIVE SEED LOCALIZATION;  Surgeon: Griselda Miner, MD;  Location: [facility];  Service: General;  Laterality: Left;   CORONARY STENT INTERVENTION N/A 08/19/2017   Procedure: CORONARY STENT INTERVENTION;  Surgeon: Yates Decamp, MD;  Location: [facility];  Service: Cardiovascular;  Laterality: N/A;   KNEE ARTHROSCOPY W/ MENISCAL REPAIR Left 2009   KNEE  ARTHROSCOPY W/ MENISCAL REPAIR Right 2014   LEFT HEART CATH AND CORONARY ANGIOGRAPHY N/A 08/19/2017   Procedure: LEFT HEART CATH AND CORONARY ANGIOGRAPHY;  Surgeon: Yates Decamp, MD;  Location: [facility];  Service: Cardiovascular;  Laterality: N/A;   LEFT HEART CATHETERIZATION WITH CORONARY ANGIOGRAM N/A 09/30/2011   Procedure: LEFT HEART CATHETERIZATION WITH CORONARY ANGIOGRAM;  Surgeon: Kathleene Hazel, MD;  Location: [facility] CATH LAB;  Service: Cardiovascular;  Laterality: N/A;   LUMBAR DISC SURGERY  11/27/2020   L4-5   TOTAL HIP ARTHROPLASTY Right 05/28/2022   Procedure: RIGHT TOTAL HIP ARTHROPLASTY ANTERIOR APPROACH;  Surgeon: [name], MD;  Location: [facility];  Service: Orthopedics;  Laterality: Right;   TUBAL LIGATION     VESICOVAGINAL FISTULA CLOSURE W/ TAH     Patient Active Problem List   Diagnosis Date Noted   Status post total replacement of right hip 05/28/2022   Diabetes mellitus (HCC) 03/16/2021   Spondylolisthesis, lumbar region 11/27/2020    Class: Chronic   Spinal stenosis, lumbar region with neurogenic claudication 11/27/2020    Class: Chronic   Fusion of spine of lumbar region 11/27/2020   Severe obstructive sleep apnea 11/09/2018   Healthcare maintenance 11/09/2018   At risk for obstructive sleep apnea 04/16/2018   Acquired hypothyroidism 03/17/2018  DOE (dyspnea on exertion) 03/16/2018   NSTEMI (non-ST elevated myocardial infarction) (HCC) 08/18/2017   Right sided sciatica 07/10/2017   Right low back pain 07/10/2017   Chest pain 09/29/2011   Morbid obesity (HCC) 09/29/2011   DM type 2 (diabetes mellitus, type 2) (HCC) 09/29/2011   HTN (hypertension) 09/29/2011   Pneumonia, bacterial 08/07/2010   Incidental lung nodule, > 3mm and < 8mm 08/07/2010   Bronchitis 08/07/2010    THERAPY DIAG:  Pain in right hip - Plan: PT plan of care cert/re-cert  Unsteadiness on feet - Plan: PT plan of care cert/re-cert  Rationale for Evaluation and  Treatment: Rehabilitation  REFERRING DIAG: Z96.641 (ICD-10-CM) - Status post total replacement of right hip   PERTINENT HISTORY: spinal stenosis, DM T2, NSTEMI  PRECAUTIONS/RESTRICTIONS:   R A THA 4/23  SUBJECTIVE: Pt presents to PT with reports of continued hip stiffness. Has been compliant with HEP with no adverse effect.   PAIN:  Are you having pain?  Yes: NPRS scale: 2/10 Pain location: R thigh region Pain description: ache Aggravating factors: activity Relieving factors: rest  OBJECTIVE: (objective measures completed at initial evaluation unless otherwise dated)  PATIENT SURVEYS:  FOTO 51(64 predicted)   MUSCLE LENGTH: Hamstrings: Right 80 deg; Left 80 deg   POSTURE: No Significant postural limitations   PALPATION: deferred   LOWER EXTREMITY ROM:   A/PROM Right eval Left eval  Hip flexion 90/110d WNL  Hip extension 0/10d WNL  Hip abduction 30/30d WNL  Hip adduction      Hip internal rotation      Hip external rotation      Knee flexion WNL WNL  Knee extension WNL WNL  Ankle dorsiflexion      Ankle plantarflexion      Ankle inversion      Ankle eversion       (Blank rows = not tested)   LOWER EXTREMITY MMT:   MMT Right eval Left eval  Hip flexion 3 5  Hip extension 3+ 5  Hip abduction 3+ 5  Hip adduction      Hip internal rotation      Hip external rotation      Knee flexion 4 5  Knee extension 4 5  Ankle dorsiflexion      Ankle plantarflexion 4 5  Ankle inversion      Ankle eversion       (Blank rows = not tested)   LOWER EXTREMITY SPECIAL TESTS:  Deferred post-op   FUNCTIONAL TESTS:  5 times sit to stand: 18s arms crossed   GAIT: Distance walked: 177ftx2 Assistive device utilized: Quad cane small base Level of assistance: Complete Independence Comments: mild R lateral WS   TREATMENT: [facility] Adult PT Treatment:                                                DATE: 07/29/22 Therapeutic Exercise: NuStep lvl 5 x 5 min while taking  subjective Seated knee ext 2x10 15# Seated knee flex 2x10 25# Leg press 2x10 35# Supine hip flexor stretch x 60" R Supine SLR 2x10 R Lateral walk RTB x 4 laps in // Standing hip abd/ext 2x15 RTB Step ups 8in 2x10 - R leading 1 UE Standing squat no UE support x 15 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 Tandem stance 2x30" each Tandem on foam 2x30" each Slow  march in // no UE x 2 laps Tandem walk x 2 laps in //  [facility] Adult PT Treatment:                                                DATE: 07/22/22 Therapeutic Exercise: Rec bike lvl 5 x 5 min while taking subjective Seated knee ext 2x10 10# Seated knee flex 2x10 20# Supine hip flexor stretch x 60" R Supine SLR 2x10 R Lateral walk RTB x 3 laps in // Standing hip abd/ext 2x10 RTB Step ups 8in 2x10 - R leading 1 UE 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 3x15 Hurdle walking in // alternate leads - moving from bil UE support to no UE support Slow march in // no UE x 2 laps Tandem walk x 3 laps in //  [facility] Adult PT Treatment:                                                DATE: 07/19/22 Therapeutic Exercise: Rec bike lvl 2 x 5 min while taking subjective Supine hip flexor stretch x 60" R Bridge with blue band 2x10 Supine clamshell 2x15 blue band Supine SLR 2x10 R STS 2x10 - no UE support - with OH Seated knee ext 2x10 10# Seated knee flexion 2x10 20# Step ups 6in x 10 - R leading 1 UE Hurdle walking in // alternate leads - moving from bil UE support to no UE support Slow march in // no UE 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   PATIENT EDUCATION:  Education details: continue HEP Person educated: Patient Education method: Explanation Education comprehension: verbalized understanding and needs further education   HOME EXERCISE PROGRAM: Access Code: [email or web address] URL: [email or web address] Date: 06/22/2022 Prepared by: Alphonzo Severance  Exercises - Modified Thomas Stretch  - 2 x  daily - 5 x weekly - 1 sets - 2 reps - 30s hold - Supine Bridge with Mini Swiss Ball Between Knees  - 2 x daily - 5 x weekly - 1 sets - 15 reps - Hooklying Single Leg Bent Knee Fallouts with Resistance  - 2 x daily - 5 x weekly - 1 sets - 15 reps - Standing Heel Raise with Chair Support  - 2 x daily - 5 x weekly - 1 sets - 15 reps - Standing [employer] at [employer]  - 2 x daily - 5 x weekly - 1 sets - 10 reps - Seated March with Resistance  - 1 x daily - 7 x weekly - 3 sets - 10 reps  CLINICAL IMPRESSION: Pt was able to complete all prescribed exercises with no adverse effect or increase in pain. Therapy today focused again on proximal hip strengthening and improving functional mobility post surgery. Increasing balance challenges with reducing UE assistance, with pt demonstrating improved balance today. Pt is progressing well with therapy, will continue per POC as prescribed.    OBJECTIVE IMPAIRMENTS: Abnormal gait, decreased activity tolerance, decreased endurance, decreased mobility, difficulty walking, decreased ROM, decreased strength, and impaired perceived functional ability.    ACTIVITY LIMITATIONS: carrying, lifting, sitting, standing, squatting, and stairs   PERSONAL FACTORS: Age, Behavior pattern, Fitness, and 1 comorbidity: DM  are also affecting patient's functional outcome.  GOALS: Goals reviewed with patient? No   SHORT TERM GOALS: Target date: 07/03/2022   Patient to demonstrate independence in HEP  Baseline: [id number] Goal status: INITIAL   LONG TERM GOALS: Target date: 08/14/2022   110d AROM R hip flexion, 10d extension Baseline:  A/PROM Right eval Left eval  Hip flexion 90/110d WNL  Hip extension 0/10d WNL  Hip abduction 30/30d WNL    Goal status: INITIAL   2.  Increase R hip strength to 4/5 Baseline:  MMT Right eval Left eval  Hip flexion 3 5  Hip extension 3+ 5  Hip abduction 3+ 5    Goal status: INITIAL   3.  Increase FOTO score to 64 Baseline:  51 Goal status: INITIAL   4.  28ft ambulation w/o need of AD Baseline: 159ft with LBQC Goal status: INITIAL         PLAN:   PT FREQUENCY: 1-2x/week   PT DURATION: 4 weeks   PLANNED INTERVENTIONS: Therapeutic exercises, Therapeutic activity, Neuromuscular re-education, Balance training, Gait training, Patient/Family education, Self Care, Joint mobilization, Stair training, Dry Needling, Manual therapy, and Re-evaluation   PLAN FOR NEXT SESSION: HEP review and update, manual techniques as appropriate, aerobic tasks, ROM and flexibility activities, strengthening and PREs, TPDN, gait and balance training as needed       Eloy End PT 07/29/2022, 9:10 AM

## 2022-07-29 ENCOUNTER — Ambulatory Visit: Payer: Medicare HMO

## 2022-07-29 DIAGNOSIS — R2681 Unsteadiness on feet: Secondary | ICD-10-CM

## 2022-07-29 DIAGNOSIS — Z96641 Presence of right artificial hip joint: Secondary | ICD-10-CM | POA: Diagnosis not present

## 2022-07-29 DIAGNOSIS — M25551 Pain in right hip: Secondary | ICD-10-CM

## 2022-07-31 ENCOUNTER — Ambulatory Visit: Payer: Medicare HMO

## 2022-07-31 DIAGNOSIS — Z96641 Presence of right artificial hip joint: Secondary | ICD-10-CM | POA: Diagnosis not present

## 2022-07-31 DIAGNOSIS — R2681 Unsteadiness on feet: Secondary | ICD-10-CM | POA: Diagnosis not present

## 2022-07-31 DIAGNOSIS — M25551 Pain in right hip: Secondary | ICD-10-CM | POA: Diagnosis not present

## 2022-07-31 NOTE — Therapy (Signed)
OUTPATIENT PHYSICAL THERAPY TREATMENT NOTE   Patient Name: OTTIE NEGLIA MRN: [id number] DOB:1960-12-02, 62 y.o., female Today's Date: 07/31/2022  PCP: Jackie Plum, MD    REFERRING PROVIDER: [name], MD   PT End of Session - 07/31/22 0830     Visit Number 9    Number of Visits 13    Date for PT Re-Evaluation 08/14/22    Authorization Type [employer] MCR    Authorization Time Period Approved 12 visits 06/19/22-08/17/22    Authorization - Number of Visits 12    PT Start Time 0830    PT Stop Time 0908    PT Time Calculation (min) 38 min    Activity Tolerance Patient tolerated treatment well    Behavior During Therapy WFL for tasks assessed/performed                  Past Medical History:  Diagnosis Date   ALLERGIC RHINITIS    Allergy to IVP dye    Arthritis    Coronary artery disease    Diabetes mellitus, type 2 (HCC)    History of tobacco abuse    Quit 2011   Hypertension    Hypertriglyceridemia    Hypothyroidism    NSTEMI (non-ST elevated myocardial infarction) (HCC) 09/30/2011   NO CAD by cath, ? coronary vasospasm   Obesity    Sleep apnea    Past Surgical History:  Procedure Laterality Date   BREAST BIOPSY Left 03/05/2022   [place] LT BREAST BX W LOC DEV 1ST LESION IMG BX SPEC US GUIDE 03/05/2022 [facility]   BREAST BIOPSY  04/25/2022   MM LT RADIOACTIVE SEED LOC MAMMO GUIDE 04/25/2022 [facility]   BREAST LUMPECTOMY WITH RADIOACTIVE SEED LOCALIZATION Left 04/26/2022   Procedure: LEFT BREAST LUMPECTOMY WITH RADIOACTIVE SEED LOCALIZATION;  Surgeon: Griselda Miner, MD;  Location: [facility];  Service: General;  Laterality: Left;   CORONARY STENT INTERVENTION N/A 08/19/2017   Procedure: CORONARY STENT INTERVENTION;  Surgeon: Yates Decamp, MD;  Location: [facility];  Service: Cardiovascular;  Laterality: N/A;   KNEE ARTHROSCOPY W/ MENISCAL REPAIR Left 2009   KNEE ARTHROSCOPY W/ MENISCAL REPAIR Right 2014    LEFT HEART CATH AND CORONARY ANGIOGRAPHY N/A 08/19/2017   Procedure: LEFT HEART CATH AND CORONARY ANGIOGRAPHY;  Surgeon: Yates Decamp, MD;  Location: [facility];  Service: Cardiovascular;  Laterality: N/A;   LEFT HEART CATHETERIZATION WITH CORONARY ANGIOGRAM N/A 09/30/2011   Procedure: LEFT HEART CATHETERIZATION WITH CORONARY ANGIOGRAM;  Surgeon: Kathleene Hazel, MD;  Location: [facility] CATH LAB;  Service: Cardiovascular;  Laterality: N/A;   LUMBAR DISC SURGERY  11/27/2020   L4-5   TOTAL HIP ARTHROPLASTY Right 05/28/2022   Procedure: RIGHT TOTAL HIP ARTHROPLASTY ANTERIOR APPROACH;  Surgeon: [name], MD;  Location: [facility];  Service: Orthopedics;  Laterality: Right;   TUBAL LIGATION     VESICOVAGINAL FISTULA CLOSURE W/ TAH     Patient Active Problem List   Diagnosis Date Noted   Status post total replacement of right hip 05/28/2022   Diabetes mellitus (HCC) 03/16/2021   Spondylolisthesis, lumbar region 11/27/2020    Class: Chronic   Spinal stenosis, lumbar region with neurogenic claudication 11/27/2020    Class: Chronic   Fusion of spine of lumbar region 11/27/2020   Severe obstructive sleep apnea 11/09/2018   Healthcare maintenance 11/09/2018   At risk for obstructive sleep apnea 04/16/2018   Acquired hypothyroidism 03/17/2018   DOE (dyspnea on exertion) 03/16/2018  NSTEMI (non-ST elevated myocardial infarction) (HCC) 08/18/2017   Right sided sciatica 07/10/2017   Right low back pain 07/10/2017   Chest pain 09/29/2011   Morbid obesity (HCC) 09/29/2011   DM type 2 (diabetes mellitus, type 2) (HCC) 09/29/2011   HTN (hypertension) 09/29/2011   Pneumonia, bacterial 08/07/2010   Incidental lung nodule, > 3mm and < 8mm 08/07/2010   Bronchitis 08/07/2010    THERAPY DIAG:  Pain in right hip  Unsteadiness on feet  Rationale for Evaluation and Treatment: Rehabilitation  REFERRING DIAG: Z96.641 (ICD-10-CM) - Status post total replacement of right hip    PERTINENT HISTORY: spinal stenosis, DM T2, NSTEMI  PRECAUTIONS/RESTRICTIONS:   R A THA 4/23  SUBJECTIVE: Pt presents to PT with reports of continued R hip stiffness, otherwise doing very well. Has been compliant with HEP.   PAIN:  Are you having pain?  Yes: NPRS scale: 2/10 Pain location: R thigh region Pain description: ache Aggravating factors: activity Relieving factors: rest  OBJECTIVE: (objective measures completed at initial evaluation unless otherwise dated)  PATIENT SURVEYS:  FOTO 51(64 predicted)   MUSCLE LENGTH: Hamstrings: Right 80 deg; Left 80 deg   POSTURE: No Significant postural limitations   PALPATION: deferred   LOWER EXTREMITY ROM:   A/PROM Right eval Left eval  Hip flexion 90/110d WNL  Hip extension 0/10d WNL  Hip abduction 30/30d WNL  Hip adduction      Hip internal rotation      Hip external rotation      Knee flexion WNL WNL  Knee extension WNL WNL  Ankle dorsiflexion      Ankle plantarflexion      Ankle inversion      Ankle eversion       (Blank rows = not tested)   LOWER EXTREMITY MMT:   MMT Right eval Left eval  Hip flexion 3 5  Hip extension 3+ 5  Hip abduction 3+ 5  Hip adduction      Hip internal rotation      Hip external rotation      Knee flexion 4 5  Knee extension 4 5  Ankle dorsiflexion      Ankle plantarflexion 4 5  Ankle inversion      Ankle eversion       (Blank rows = not tested)   LOWER EXTREMITY SPECIAL TESTS:  Deferred post-op   FUNCTIONAL TESTS:  5 times sit to stand: 18s arms crossed   GAIT: Distance walked: 145ftx2 Assistive device utilized: Quad cane small base Level of assistance: Complete Independence Comments: mild R lateral WS   TREATMENT: [facility] Adult PT Treatment:                                                DATE: 07/31/22 Therapeutic Exercise: NuStep lvl 5 x 5 min while taking subjective Seated knee ext 2x15 15# Seated knee flex 2x10 30# Leg press 2x10 35# Standing hip abd/ext  2x10 30# Lateral walk x 4 laps in // GTB Standing squat x 10 - BUE support Supine hip flexor stretch x 60" R Supine SLR 2x10 R Bridge blue 3x15 Supine clamshell x 30 blue band STS 2x10 - no UE  Step ups 8in x 15 - R leading 1 UE  [facility] Adult PT Treatment:  DATE: 07/29/22 Therapeutic Exercise: NuStep lvl 5 x 5 min while taking subjective Seated knee ext 2x10 15# Seated knee flex 2x10 25# Leg press 2x10 35# Supine hip flexor stretch x 60" R Supine SLR 2x10 R Lateral walk RTB x 4 laps in // Standing hip abd/ext 2x15 RTB Step ups 8in 2x10 - R leading 1 UE Standing squat no UE support x 15 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 Tandem stance 2x30" each Tandem on foam 2x30" each Slow march in // no UE x 2 laps Tandem walk x 2 laps in //  [facility] Adult PT Treatment:                                                DATE: 07/22/22 Therapeutic Exercise: Rec bike lvl 5 x 5 min while taking subjective Seated knee ext 2x10 10# Seated knee flex 2x10 20# Supine hip flexor stretch x 60" R Supine SLR 2x10 R Lateral walk RTB x 3 laps in // Standing hip abd/ext 2x10 RTB Step ups 8in 2x10 - R leading 1 UE 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 3x15 Hurdle walking in // alternate leads - moving from bil UE support to no UE support Slow march in // no UE x 2 laps Tandem walk x 3 laps in //  [facility] Adult PT Treatment:                                                DATE: 07/19/22 Therapeutic Exercise: Rec bike lvl 2 x 5 min while taking subjective Supine hip flexor stretch x 60" R Bridge with blue band 2x10 Supine clamshell 2x15 blue band Supine SLR 2x10 R STS 2x10 - no UE support - with OH Seated knee ext 2x10 10# Seated knee flexion 2x10 20# Step ups 6in x 10 - R leading 1 UE Hurdle walking in // alternate leads - moving from bil UE support to no UE support Slow march in // no UE Neuromuscular re-ed: [employer] systems engineer  DF/PF   PATIENT EDUCATION:  Education details: continue HEP Person educated: Patient Education method: Explanation Education comprehension: verbalized understanding and needs further education   HOME EXERCISE PROGRAM: Access Code: [email or web address] URL: [email or web address] Date: 06/22/2022 Prepared by: Alphonzo Severance  Exercises - Modified Thomas Stretch  - 2 x daily - 5 x weekly - 1 sets - 2 reps - 30s hold - Supine Bridge with Mini Swiss Ball Between Knees  - 2 x daily - 5 x weekly - 1 sets - 15 reps - Hooklying Single Leg Bent Knee Fallouts with Resistance  - 2 x daily - 5 x weekly - 1 sets - 15 reps - Standing Heel Raise with Chair Support  - 2 x daily - 5 x weekly - 1 sets - 15 reps - Standing [employer] at [employer]  - 2 x daily - 5 x weekly - 1 sets - 10 reps - Seated March with Resistance  - 1 x daily - 7 x weekly - 3 sets - 10 reps  CLINICAL IMPRESSION: Pt was able to complete all prescribed exercises with no adverse effect or increase in pain. Therapy today focused on proximal hip strengthening and improving functional mobility post surgery. Pt is  progressing well with therapy, will continue per POC as prescribed.    OBJECTIVE IMPAIRMENTS: Abnormal gait, decreased activity tolerance, decreased endurance, decreased mobility, difficulty walking, decreased ROM, decreased strength, and impaired perceived functional ability.    ACTIVITY LIMITATIONS: carrying, lifting, sitting, standing, squatting, and stairs   PERSONAL FACTORS: Age, Behavior pattern, Fitness, and 1 comorbidity: DM  are also affecting patient's functional outcome.     GOALS: Goals reviewed with patient? No   SHORT TERM GOALS: Target date: 07/03/2022   Patient to demonstrate independence in HEP  Baseline: 4KEYT2X4 Goal status: INITIAL   LONG TERM GOALS: Target date: 08/14/2022   110d AROM R hip flexion, 10d extension Baseline:  A/PROM Right eval Left eval  Hip flexion 90/110d WNL  Hip  extension 0/10d WNL  Hip abduction 30/30d WNL    Goal status: INITIAL   2.  Increase R hip strength to 4/5 Baseline:  MMT Right eval Left eval  Hip flexion 3 5  Hip extension 3+ 5  Hip abduction 3+ 5    Goal status: INITIAL   3.  Increase FOTO score to 64 Baseline: 51 Goal status: INITIAL   4.  275ft ambulation w/o need of AD Baseline: 147ft with LBQC Goal status: INITIAL         PLAN:   PT FREQUENCY: 1-2x/week   PT DURATION: 4 weeks   PLANNED INTERVENTIONS: Therapeutic exercises, Therapeutic activity, Neuromuscular re-education, Balance training, Gait training, Patient/Family education, Self Care, Joint mobilization, Stair training, Dry Needling, Manual therapy, and Re-evaluation   PLAN FOR NEXT SESSION: HEP review and update, manual techniques as appropriate, aerobic tasks, ROM and flexibility activities, strengthening and PREs, TPDN, gait and balance training as needed       Eloy End PT 07/31/2022, 9:09 AM

## 2022-08-05 NOTE — Therapy (Signed)
OUTPATIENT PHYSICAL THERAPY TREATMENT NOTE/PROGRESS NOTE  Progress Note Reporting Period 06/19/2022 to 08/06/2022  See note below for Objective Data and Assessment of Progress/Goals.      Patient Name: [name] MRN: [id number] DOB:[birth year], 62 y.o., female Today's Date: 08/06/2022  PCP: Jackie Plum, MD    REFERRING PROVIDER: [name], MD   PT End of Session - 08/06/22 0741     Visit Number 10    Number of Visits 13    Date for PT Re-Evaluation 08/14/22    Authorization Type [employer] MCR    Authorization Time Period Approved 12 visits 06/19/22-08/17/22    Authorization - Number of Visits 12    PT Start Time 0745    PT Stop Time 0823    PT Time Calculation (min) 38 min    Activity Tolerance Patient tolerated treatment well    Behavior During Therapy [facility] for tasks assessed/performed                   Past Medical History:  Diagnosis Date   ALLERGIC RHINITIS    Allergy to IVP dye    Arthritis    Coronary artery disease    Diabetes mellitus, type 2 (HCC)    History of tobacco abuse    Quit 2011   Hypertension    Hypertriglyceridemia    Hypothyroidism    NSTEMI (non-ST elevated myocardial infarction) (HCC) 09/30/2011   NO CAD by cath, ? coronary vasospasm   Obesity    Sleep apnea    Past Surgical History:  Procedure Laterality Date   BREAST BIOPSY Left 03/05/2022   [place] LT BREAST BX W LOC DEV 1ST LESION IMG BX SPEC US GUIDE 03/05/2022 [facility]   BREAST BIOPSY  04/25/2022   MM LT RADIOACTIVE SEED LOC MAMMO GUIDE 04/25/2022 [facility]   BREAST LUMPECTOMY WITH RADIOACTIVE SEED LOCALIZATION Left 04/26/2022   Procedure: LEFT BREAST LUMPECTOMY WITH RADIOACTIVE SEED LOCALIZATION;  Surgeon: Griselda Miner, MD;  Location: [facility];  Service: General;  Laterality: Left;   CORONARY STENT INTERVENTION N/A 08/19/2017   Procedure: CORONARY STENT INTERVENTION;  Surgeon: Yates Decamp, MD;  [facility]: [facility];  Service: Cardiovascular;  Laterality: N/A;   KNEE ARTHROSCOPY W/ MENISCAL REPAIR Left 2009   KNEE ARTHROSCOPY W/ MENISCAL REPAIR Right 2014   LEFT HEART CATH AND CORONARY ANGIOGRAPHY N/A 08/19/2017   Procedure: LEFT HEART CATH AND CORONARY ANGIOGRAPHY;  Surgeon: Yates Decamp, MD;  Location: [facility];  Service: Cardiovascular;  Laterality: N/A;   LEFT HEART CATHETERIZATION WITH CORONARY ANGIOGRAM N/A 09/30/2011   Procedure: LEFT HEART CATHETERIZATION WITH CORONARY ANGIOGRAM;  Surgeon: Kathleene Hazel, MD;  Location: [facility] CATH LAB;  Service: Cardiovascular;  Laterality: N/A;   LUMBAR DISC SURGERY  11/27/2020   L4-5   TOTAL HIP ARTHROPLASTY Right 05/28/2022   Procedure: RIGHT TOTAL HIP ARTHROPLASTY ANTERIOR APPROACH;  Surgeon: [name], MD;  Location: [facility];  Service: Orthopedics;  Laterality: Right;   TUBAL LIGATION     VESICOVAGINAL FISTULA CLOSURE W/ TAH     Patient Active Problem List   Diagnosis Date Noted   Status post total replacement of right hip 05/28/2022   Diabetes mellitus (HCC) 03/16/2021   Spondylolisthesis, lumbar region 11/27/2020    Class: Chronic   Spinal stenosis, lumbar region with neurogenic claudication 11/27/2020    Class: Chronic   Fusion of spine of lumbar region 11/27/2020   Severe obstructive sleep apnea 11/09/2018  Healthcare maintenance 11/09/2018   At risk for obstructive sleep apnea 04/16/2018   Acquired hypothyroidism 03/17/2018   DOE (dyspnea on exertion) 03/16/2018   NSTEMI (non-ST elevated myocardial infarction) (HCC) 08/18/2017   Right sided sciatica 07/10/2017   Right low back pain 07/10/2017   Chest pain 09/29/2011   Morbid obesity (HCC) 09/29/2011   DM type 2 (diabetes mellitus, type 2) (HCC) 09/29/2011   HTN (hypertension) 09/29/2011   Pneumonia, bacterial 08/07/2010   Incidental lung nodule, > 3mm and < 8mm 08/07/2010   Bronchitis 08/07/2010    THERAPY DIAG:  Pain in right hip  Unsteadiness on  feet  Rationale for Evaluation and Treatment: Rehabilitation  REFERRING DIAG: Z96.641 (ICD-10-CM) - Status post total replacement of right hip   PERTINENT HISTORY: spinal stenosis, DM T2, NSTEMI  PRECAUTIONS/RESTRICTIONS:   R A THA 4/23  SUBJECTIVE: Pt presents to PT with reports of continued R hip stiffness but overall she is doing very well. Pt has been compliant with HEP with no adverse effect.   PAIN:  Are you having pain?  Yes: NPRS scale: 2/10 Pain location: R thigh region Pain description: ache Aggravating factors: activity Relieving factors: rest  OBJECTIVE: (objective measures completed at initial evaluation unless otherwise dated)  PATIENT SURVEYS:  FOTO 51(64 predicted) 08/06/2022: 67% function   MUSCLE LENGTH: Hamstrings: Right 80 deg; Left 80 deg   POSTURE: No Significant postural limitations   PALPATION: deferred   LOWER EXTREMITY ROM:   A/PROM Right eval Left eval  Hip flexion 90/110d WNL  Hip extension 0/10d WNL  Hip abduction 30/30d WNL  Hip adduction      Hip internal rotation      Hip external rotation      Knee flexion WNL WNL  Knee extension WNL WNL  Ankle dorsiflexion      Ankle plantarflexion      Ankle inversion      Ankle eversion       (Blank rows = not tested)   LOWER EXTREMITY MMT:   MMT Right eval Left eval  Hip flexion 3 5  Hip extension 3+ 5  Hip abduction 3+ 5  Hip adduction      Hip internal rotation      Hip external rotation      Knee flexion 4 5  Knee extension 4 5  Ankle dorsiflexion      Ankle plantarflexion 4 5  Ankle inversion      Ankle eversion       (Blank rows = not tested)   LOWER EXTREMITY SPECIAL TESTS:  Deferred post-op   FUNCTIONAL TESTS:  5 times sit to stand: 18s arms crossed   GAIT: Distance walked: 130ftx2 Assistive device utilized: Quad cane small base Level of assistance: Complete Independence Comments: mild R lateral WS   TREATMENT: [facility] Adult PT Treatment:                                                 DATE: 08/06/22 Therapeutic Exercise: NuStep lvl 5 x 5 min while taking subjective Modified thomas stretch x 90" Supine SLR 3x10 2# Seated knee ext 2x10 15# Seated knee flex 2x10 35# Leg press 3x10 45# Standing hip abd/ext 2x10 30# Lateral walk x 5 laps GTB at counter Standing squat x 10 - BUE support Step ups 8in 2x10 - R leading 1 UE Tandem on  foam 2x30" R back  [facility] Adult PT Treatment:                                                DATE: 07/31/22 Therapeutic Exercise: NuStep lvl 5 x 5 min while taking subjective Seated knee ext 2x15 15# Seated knee flex 2x10 30# Leg press 2x10 35# Standing hip abd/ext 2x10 30# Lateral walk x 4 laps in // GTB Standing squat x 10 - BUE support Supine hip flexor stretch x 60" R Supine SLR 2x10 R Bridge blue 3x15 Supine clamshell x 30 blue band STS 2x10 - no UE  Step ups 8in x 15 - R leading 1 UE  [facility] Adult PT Treatment:                                                DATE: 07/29/22 Therapeutic Exercise: NuStep lvl 5 x 5 min while taking subjective Seated knee ext 2x10 15# Seated knee flex 2x10 25# Leg press 2x10 35# Supine hip flexor stretch x 60" R Supine SLR 2x10 R Lateral walk RTB x 4 laps in // Standing hip abd/ext 2x15 RTB Step ups 8in 2x10 - R leading 1 UE Standing squat no UE support x 15 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 Tandem stance 2x30" each Tandem on foam 2x30" each Slow march in // no UE x 2 laps Tandem walk x 2 laps in //  PATIENT EDUCATION:  Education details: continue HEP Person educated: Patient Education method: Explanation Education comprehension: verbalized understanding and needs further education   HOME EXERCISE PROGRAM: Access Code: [email or web address] URL: [email or web address] Date: 06/22/2022 Prepared by: Alphonzo Severance  Exercises - Modified Thomas Stretch  - 2 x daily - 5 x weekly - 1 sets - 2 reps - 30s hold - Supine Bridge with Mini Swiss Ball  Between Knees  - 2 x daily - 5 x weekly - 1 sets - 15 reps - Hooklying Single Leg Bent Knee Fallouts with Resistance  - 2 x daily - 5 x weekly - 1 sets - 15 reps - Standing Heel Raise with Chair Support  - 2 x daily - 5 x weekly - 1 sets - 15 reps - Standing [employer] at [employer]  - 2 x daily - 5 x weekly - 1 sets - 10 reps - Seated March with Resistance  - 1 x daily - 7 x weekly - 3 sets - 10 reps  CLINICAL IMPRESSION: Pt was able to complete all prescribed exercises with no adverse effect or increase in pain. Over the course PT treatment she has been progressing very well, showing sharp increase in subjective functional ability meeting FOTO score today. Therapy continued to focus on proximal hip strengthening and improving functional mobility post surgery. Pt is progressing well with therapy, will continue per POC as prescribed.    OBJECTIVE IMPAIRMENTS: Abnormal gait, decreased activity tolerance, decreased endurance, decreased mobility, difficulty walking, decreased ROM, decreased strength, and impaired perceived functional ability.    ACTIVITY LIMITATIONS: carrying, lifting, sitting, standing, squatting, and stairs   PERSONAL FACTORS: Age, Behavior pattern, Fitness, and 1 comorbidity: DM  are also affecting patient's functional outcome.     GOALS: Goals reviewed with patient? No   SHORT  TERM GOALS: Target date: 07/03/2022   Patient to demonstrate independence in HEP  Baseline: 4KEYT2X4 Goal status: MET   LONG TERM GOALS: Target date: 08/14/2022   110d AROM R hip flexion, 10d extension Baseline:  A/PROM Right eval Left eval  Hip flexion 90/110d WNL  Hip extension 0/10d WNL  Hip abduction 30/30d WNL    Goal status: INITIAL   2.  Increase R hip strength to 4/5 Baseline:  MMT Right eval Left eval  Hip flexion 3 5  Hip extension 3+ 5  Hip abduction 3+ 5    Goal status: INITIAL   3.  Increase FOTO score to 64 Baseline: 51 08/06/2022: 67%  Goal status: MET   4.   261ft ambulation w/o need of AD Baseline: 174ft with LBQC Goal status: INITIAL         PLAN:   PT FREQUENCY: 1-2x/week   PT DURATION: 4 weeks   PLANNED INTERVENTIONS: Therapeutic exercises, Therapeutic activity, Neuromuscular re-education, Balance training, Gait training, Patient/Family education, Self Care, Joint mobilization, Stair training, Dry Needling, Manual therapy, and Re-evaluation   PLAN FOR NEXT SESSION: HEP review and update, manual techniques as appropriate, aerobic tasks, ROM and flexibility activities, strengthening and PREs, TPDN, gait and balance training as needed       Eloy End PT 08/06/2022, 9:05 AM

## 2022-08-06 ENCOUNTER — Ambulatory Visit: Payer: Medicare HMO | Attending: Orthopaedic Surgery

## 2022-08-06 DIAGNOSIS — M25551 Pain in right hip: Secondary | ICD-10-CM | POA: Diagnosis not present

## 2022-08-06 DIAGNOSIS — Z96641 Presence of right artificial hip joint: Secondary | ICD-10-CM | POA: Insufficient documentation

## 2022-08-06 DIAGNOSIS — R2681 Unsteadiness on feet: Secondary | ICD-10-CM | POA: Insufficient documentation

## 2022-08-11 NOTE — Therapy (Addendum)
OUTPATIENT PHYSICAL THERAPY TREATMENT NOTE/DC SUMMARY      Patient Name: [name] MRN: [id number] DOB:01/24/1961, 62 y.o., female Today's Date: 08/12/2022  PCP: Jackie Plum, MD    REFERRING PROVIDER: [name], MD PHYSICAL THERAPY DISCHARGE SUMMARY  Visits from Start of Care: 11  Current functional level related to goals / functional outcomes: Goals met   Remaining deficits: Strength deficits   Education / Equipment: HEP   Patient agrees to discharge. Patient goals were met. Patient is being discharged due to being pleased with the current functional level.   PT End of Session - 08/12/22 0755     Visit Number 11    Number of Visits 13    Date for PT Re-Evaluation 08/14/22    Authorization Type [employer] MCR    Authorization Time Period Approved 12 visits 06/19/22-08/17/22    Authorization - Number of Visits 12    PT Start Time 0755   late for appointment   PT Stop Time 0825    PT Time Calculation (min) 30 min    Activity Tolerance Patient tolerated treatment well    Behavior During Therapy WFL for tasks assessed/performed                    Past Medical History:  Diagnosis Date   ALLERGIC RHINITIS    Allergy to IVP dye    Arthritis    Coronary artery disease    Diabetes mellitus, type 2 (HCC)    History of tobacco abuse    Quit 2011   Hypertension    Hypertriglyceridemia    Hypothyroidism    NSTEMI (non-ST elevated myocardial infarction) (HCC) 09/30/2011   NO CAD by cath, ? coronary vasospasm   Obesity    Sleep apnea    Past Surgical History:  Procedure Laterality Date   BREAST BIOPSY Left 03/05/2022   [place] LT BREAST BX W LOC DEV 1ST LESION IMG BX SPEC US GUIDE 03/05/2022 [facility]   BREAST BIOPSY  04/25/2022   MM LT RADIOACTIVE SEED LOC MAMMO GUIDE 04/25/2022 [facility]   BREAST LUMPECTOMY WITH RADIOACTIVE SEED LOCALIZATION Left 04/26/2022   Procedure: LEFT BREAST LUMPECTOMY WITH RADIOACTIVE SEED  LOCALIZATION;  Surgeon: Griselda Miner, MD;  [facility];  Service: General;  Laterality: Left;   CORONARY STENT INTERVENTION N/A 08/19/2017   Procedure: CORONARY STENT INTERVENTION;  Surgeon: Yates Decamp, MD;  Location: [facility];  Service: Cardiovascular;  Laterality: N/A;   KNEE ARTHROSCOPY W/ MENISCAL REPAIR Left 2009   KNEE ARTHROSCOPY W/ MENISCAL REPAIR Right 2014   LEFT HEART CATH AND CORONARY ANGIOGRAPHY N/A 08/19/2017   Procedure: LEFT HEART CATH AND CORONARY ANGIOGRAPHY;  Surgeon: Yates Decamp, MD;  [facility]: [facility];  Service: Cardiovascular;  Laterality: N/A;   LEFT HEART CATHETERIZATION WITH CORONARY ANGIOGRAM N/A 09/30/2011   Procedure: LEFT HEART CATHETERIZATION WITH CORONARY ANGIOGRAM;  Surgeon: Kathleene Hazel, MD;  Location: [facility] CATH LAB;  Service: Cardiovascular;  Laterality: N/A;   LUMBAR DISC SURGERY  11/27/2020   L4-5   TOTAL HIP ARTHROPLASTY Right 05/28/2022   Procedure: RIGHT TOTAL HIP ARTHROPLASTY ANTERIOR APPROACH;  Surgeon: [name], MD;  Location: [facility];  Service: Orthopedics;  Laterality: Right;   TUBAL LIGATION     VESICOVAGINAL FISTULA CLOSURE W/ TAH     Patient Active Problem List   Diagnosis Date Noted   Status post total replacement of right hip 05/28/2022   Diabetes mellitus (HCC) 03/16/2021  Spondylolisthesis, lumbar region 11/27/2020    Class: Chronic   Spinal stenosis, lumbar region with neurogenic claudication 11/27/2020    Class: Chronic   Fusion of spine of lumbar region 11/27/2020   Severe obstructive sleep apnea 11/09/2018   Healthcare maintenance 11/09/2018   At risk for obstructive sleep apnea 04/16/2018   Acquired hypothyroidism 03/17/2018   DOE (dyspnea on exertion) 03/16/2018   NSTEMI (non-ST elevated myocardial infarction) (HCC) 08/18/2017   Right sided sciatica 07/10/2017   Right low back pain 07/10/2017   Chest pain 09/29/2011   Morbid obesity (HCC) 09/29/2011   DM type  2 (diabetes mellitus, type 2) (HCC) 09/29/2011   HTN (hypertension) 09/29/2011   Pneumonia, bacterial 08/07/2010   Incidental lung nodule, > 3mm and < 8mm 08/07/2010   Bronchitis 08/07/2010    THERAPY DIAG:  Pain in right hip  Unsteadiness on feet  Status post total replacement of right hip  Rationale for Evaluation and Treatment: Rehabilitation  REFERRING DIAG: Z96.641 (ICD-10-CM) - Status post total replacement of right hip   PERTINENT HISTORY: spinal stenosis, DM T2, NSTEMI  PRECAUTIONS/RESTRICTIONS:   R A THA 4/23  SUBJECTIVE: Patient reports  no pain in R hip just soreness  PAIN:  Are you having pain?  Yes: NPRS scale: 2/10 Pain location: R thigh region Pain description: ache Aggravating factors: activity Relieving factors: rest  OBJECTIVE: (objective measures completed at initial evaluation unless otherwise dated)  PATIENT SURVEYS:  FOTO 51(64 predicted) 08/06/2022: 67% function   MUSCLE LENGTH: Hamstrings: Right 80 deg; Left 80 deg   POSTURE: No Significant postural limitations   PALPATION: deferred   LOWER EXTREMITY ROM:   A/PROM Right eval Left eval R 08/12/22  Hip flexion 90/110d WNL 110d  Hip extension 0/10d WNL 10/15d  Hip abduction 30/30d WNL 35/40d  Hip adduction       Hip internal rotation       Hip external rotation       Knee flexion WNL WNL   Knee extension WNL WNL   Ankle dorsiflexion       Ankle plantarflexion       Ankle inversion       Ankle eversion        (Blank rows = not tested)   LOWER EXTREMITY MMT:   MMT Right eval Left eval R 08/12/22  Hip flexion 3 5 4-  Hip extension 3+ 5 4-  Hip abduction 3+ 5 4-  Hip adduction       Hip internal rotation       Hip external rotation       Knee flexion 4 5 4+  Knee extension 4 5 4+  Ankle dorsiflexion       Ankle plantarflexion 4 5 4   Ankle inversion       Ankle eversion        (Blank rows = not tested)   LOWER EXTREMITY SPECIAL TESTS:  Deferred post-op   FUNCTIONAL  TESTS:  5 times sit to stand: 18s arms crossed   GAIT: Distance walked: 150ftx2 Assistive device utilized: Quad cane small base Level of assistance: Complete Independence Comments: mild R lateral WS   TREATMENT: [facility] Adult PT Treatment:                                                DATE: 08/12/22 Therapeutic Exercise: Nustep L6 8 min  16 steps with step through pattern and B rail assist Tandem walk at counter 5 trips fwd/bwd  Runners step 6 in 10/10 with UE support Assessment of RLE ROM and strength  [facility] Adult PT Treatment:                                                DATE: 08/06/22 Therapeutic Exercise: NuStep lvl 5 x 5 min while taking subjective Modified thomas stretch x 90" Supine SLR 3x10 2# Seated knee ext 2x10 15# Seated knee flex 2x10 35# Leg press 3x10 45# Standing hip abd/ext 2x10 30# Lateral walk x 5 laps GTB at counter Standing squat x 10 - BUE support Step ups 8in 2x10 - R leading 1 UE Tandem on foam 2x30" R back  [facility] Adult PT Treatment:                                                DATE: 07/31/22 Therapeutic Exercise: NuStep lvl 5 x 5 min while taking subjective Seated knee ext 2x15 15# Seated knee flex 2x10 30# Leg press 2x10 35# Standing hip abd/ext 2x10 30# Lateral walk x 4 laps in // GTB Standing squat x 10 - BUE support Supine hip flexor stretch x 60" R Supine SLR 2x10 R Bridge blue 3x15 Supine clamshell x 30 blue band STS 2x10 - no UE  Step ups 8in x 15 - R leading 1 UE  [facility] Adult PT Treatment:                                                DATE: 07/29/22 Therapeutic Exercise: NuStep lvl 5 x 5 min while taking subjective Seated knee ext 2x10 15# Seated knee flex 2x10 25# Leg press 2x10 35# Supine hip flexor stretch x 60" R Supine SLR 2x10 R Lateral walk RTB x 4 laps in // Standing hip abd/ext 2x15 RTB Step ups 8in 2x10 - R leading 1 UE Standing squat no UE support x 15 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 Tandem stance  2x30" each Tandem on foam 2x30" each Slow march in // no UE x 2 laps Tandem walk x 2 laps in //  PATIENT EDUCATION:  Education details: continue HEP Person educated: Patient Education method: Explanation Education comprehension: verbalized understanding and needs further education   HOME EXERCISE PROGRAM: Access Code: [email or web address] URL: [email or web address] Date: 06/22/2022 Prepared by: Alphonzo Severance  Exercises - Modified Thomas Stretch  - 2 x daily - 5 x weekly - 1 sets - 2 reps - 30s hold - Supine Bridge with Mini Swiss Ball Between Knees  - 2 x daily - 5 x weekly - 1 sets - 15 reps - Hooklying Single Leg Bent Knee Fallouts with Resistance  - 2 x daily - 5 x weekly - 1 sets - 15 reps - Standing Heel Raise with Chair Support  - 2 x daily - 5 x weekly - 1 sets - 15 reps - Standing [employer] at [employer]  - 2 x daily - 5 x weekly - 1 sets - 10 reps - Seated March with Resistance  -  1 x daily - 7 x weekly - 3 sets - 10 reps  CLINICAL IMPRESSION: R hip pain essentially resolved only noting soreness at times.  Main concern is balance and proprioception when ambulating. R hip ROM goals met.  Reviewed stair negotiation.  LTGs almost met and patient should be ready for independent management after next visit.   OBJECTIVE IMPAIRMENTS: Abnormal gait, decreased activity tolerance, decreased endurance, decreased mobility, difficulty walking, decreased ROM, decreased strength, and impaired perceived functional ability.    ACTIVITY LIMITATIONS: carrying, lifting, sitting, standing, squatting, and stairs   PERSONAL FACTORS: Age, Behavior pattern, Fitness, and 1 comorbidity: DM  are also affecting patient's functional outcome.     GOALS: Goals reviewed with patient? No   SHORT TERM GOALS: Target date: 07/03/2022   Patient to demonstrate independence in HEP  Baseline: 4KEYT2X4 Goal status: MET   LONG TERM GOALS: Target date: 08/14/2022   110d AROM R hip flexion, 10d  extension Baseline:  A/PROM Right eval Left eval R 08/12/22  Hip flexion 90/110d WNL 110d  Hip extension 0/10d WNL 10/15d  Hip abduction 30/30d WNL 35/40d    Goal status: Met   2.  Increase R hip strength to 4/5 Baseline:  MMT Right eval Left eval  Hip flexion 3 5  Hip extension 3+ 5  Hip abduction 3+ 5    Goal status: INITIAL   3.  Increase FOTO score to 64 Baseline: 51 08/06/2022: 67%  Goal status: MET   4.  280ft ambulation w/o need of AD Baseline: 133ft with LBQC; 08/12/22 261ft + w/o need of AD Goal status: Met         PLAN:   PT FREQUENCY: 1-2x/week   PT DURATION: 4 weeks   PLANNED INTERVENTIONS: Therapeutic exercises, Therapeutic activity, Neuromuscular re-education, Balance training, Gait training, Patient/Family education, Self Care, Joint mobilization, Stair training, Dry Needling, Manual therapy, and Re-evaluation   PLAN FOR NEXT SESSION: Assess for DC     Hildred Laser PT 08/12/2022, 7:58 AM

## 2022-08-12 ENCOUNTER — Ambulatory Visit: Payer: Medicare HMO

## 2022-08-12 DIAGNOSIS — R2681 Unsteadiness on feet: Secondary | ICD-10-CM | POA: Diagnosis not present

## 2022-08-12 DIAGNOSIS — M25551 Pain in right hip: Secondary | ICD-10-CM

## 2022-08-12 DIAGNOSIS — Z96641 Presence of right artificial hip joint: Secondary | ICD-10-CM | POA: Diagnosis not present

## 2022-08-18 ENCOUNTER — Other Ambulatory Visit: Payer: Self-pay | Admitting: Internal Medicine

## 2022-08-22 ENCOUNTER — Ambulatory Visit: Payer: Medicare HMO | Admitting: Physical Therapy

## 2022-08-22 ENCOUNTER — Telehealth: Payer: Self-pay | Admitting: *Deleted

## 2022-08-22 ENCOUNTER — Other Ambulatory Visit: Payer: Self-pay | Admitting: Orthopaedic Surgery

## 2022-08-22 MED ORDER — AMOXICILLIN 500 MG PO TABS
500.0000 mg | ORAL_TABLET | Freq: Two times a day (BID) | ORAL | 0 refills | Status: DC
Start: 2022-08-22 — End: 2022-09-24

## 2022-08-22 NOTE — Telephone Encounter (Signed)
Patient aware.

## 2022-08-22 NOTE — Telephone Encounter (Signed)
Patient called from her dentist office and she is having a cleaning and X-rays. They are asking if she needs antibiotics and I explained after 3 months typically no. She is S/P R-THA on 05/28/22, so just a few days shy. Also it is a periodontal cleaning is what hygienist said meaning that it's a little deeper than normal.

## 2022-08-26 DIAGNOSIS — Z0001 Encounter for general adult medical examination with abnormal findings: Secondary | ICD-10-CM | POA: Diagnosis not present

## 2022-08-26 DIAGNOSIS — Z720 Tobacco use: Secondary | ICD-10-CM | POA: Diagnosis not present

## 2022-08-26 DIAGNOSIS — E039 Hypothyroidism, unspecified: Secondary | ICD-10-CM | POA: Diagnosis not present

## 2022-08-26 DIAGNOSIS — I10 Essential (primary) hypertension: Secondary | ICD-10-CM | POA: Diagnosis not present

## 2022-08-26 DIAGNOSIS — J302 Other seasonal allergic rhinitis: Secondary | ICD-10-CM | POA: Diagnosis not present

## 2022-08-26 DIAGNOSIS — I2510 Atherosclerotic heart disease of native coronary artery without angina pectoris: Secondary | ICD-10-CM | POA: Diagnosis not present

## 2022-08-26 DIAGNOSIS — Z794 Long term (current) use of insulin: Secondary | ICD-10-CM | POA: Diagnosis not present

## 2022-08-26 DIAGNOSIS — E1165 Type 2 diabetes mellitus with hyperglycemia: Secondary | ICD-10-CM | POA: Diagnosis not present

## 2022-08-26 DIAGNOSIS — E782 Mixed hyperlipidemia: Secondary | ICD-10-CM | POA: Diagnosis not present

## 2022-09-16 DIAGNOSIS — I2510 Atherosclerotic heart disease of native coronary artery without angina pectoris: Secondary | ICD-10-CM | POA: Diagnosis not present

## 2022-09-16 DIAGNOSIS — E782 Mixed hyperlipidemia: Secondary | ICD-10-CM | POA: Diagnosis not present

## 2022-09-16 DIAGNOSIS — Z720 Tobacco use: Secondary | ICD-10-CM | POA: Diagnosis not present

## 2022-09-16 DIAGNOSIS — Z794 Long term (current) use of insulin: Secondary | ICD-10-CM | POA: Diagnosis not present

## 2022-09-16 DIAGNOSIS — J302 Other seasonal allergic rhinitis: Secondary | ICD-10-CM | POA: Diagnosis not present

## 2022-09-16 DIAGNOSIS — Z6839 Body mass index (BMI) 39.0-39.9, adult: Secondary | ICD-10-CM | POA: Diagnosis not present

## 2022-09-16 DIAGNOSIS — I10 Essential (primary) hypertension: Secondary | ICD-10-CM | POA: Diagnosis not present

## 2022-09-16 DIAGNOSIS — E1165 Type 2 diabetes mellitus with hyperglycemia: Secondary | ICD-10-CM | POA: Diagnosis not present

## 2022-09-16 DIAGNOSIS — E039 Hypothyroidism, unspecified: Secondary | ICD-10-CM | POA: Diagnosis not present

## 2022-09-24 ENCOUNTER — Encounter: Payer: Self-pay | Admitting: Cardiology

## 2022-09-24 ENCOUNTER — Ambulatory Visit: Payer: Medicare HMO | Admitting: Cardiology

## 2022-09-24 VITALS — BP 130/69 | HR 67 | Resp 16 | Ht 68.0 in | Wt 255.0 lb

## 2022-09-24 DIAGNOSIS — E782 Mixed hyperlipidemia: Secondary | ICD-10-CM | POA: Diagnosis not present

## 2022-09-24 DIAGNOSIS — Z955 Presence of coronary angioplasty implant and graft: Secondary | ICD-10-CM

## 2022-09-24 DIAGNOSIS — E1159 Type 2 diabetes mellitus with other circulatory complications: Secondary | ICD-10-CM | POA: Diagnosis not present

## 2022-09-24 DIAGNOSIS — Z87891 Personal history of nicotine dependence: Secondary | ICD-10-CM | POA: Diagnosis not present

## 2022-09-24 DIAGNOSIS — I10 Essential (primary) hypertension: Secondary | ICD-10-CM

## 2022-09-24 DIAGNOSIS — Z794 Long term (current) use of insulin: Secondary | ICD-10-CM

## 2022-09-24 DIAGNOSIS — I2510 Atherosclerotic heart disease of native coronary artery without angina pectoris: Secondary | ICD-10-CM

## 2022-09-24 NOTE — Progress Notes (Signed)
Date:  09/24/2022   ID:  [name], DOB 09/15/1960, MRN [id number]  PCP:  Jackie Plum, MD  Cardiologist:  Tessa Lerner, DO, [facility] (established care 06/01/2020) Former Cardiology Providers: Dr. Clifton James, Dr. Elease Hashimoto, Dr. Jacinto Halim, Dr. Deno Etienne, Dr. Wende Bushy  Date: 09/24/22 Last Office Visit: March 26, 2022  Chief Complaint  Patient presents with   Coronary Artery Disease   Follow-up    HPI  [name] is a 62 y.o. female whose past medical history and cardiovascular risk factors include: Hypertension, hyperlipidemia, insulin dependent diabetes mellitus type 2, family history of premature CAD (mom had PCI's at the age of 96), established CAD status post PCI to the RCA, former smoker, OSA on CPAP, obesity due to excess calories.  Patient presents today for 26-month follow-up visit given her history of coronary artery disease status post intervention.  Since last office visit she has also undergone left breast lumpectomy and right hip replacement.  Postsurgery she has not had any cardiovascular complications.  She continues to implement lifestyle changes and keeping herself active and has lost additional weight.  She has  lost more than 40 pounds since June 2023 she is congratulated on her efforts.  FUNCTIONAL STATUS: Has started working out on a treadmill 4 times a day 10 minutes each.  ALLERGIES: Allergies  Allergen Reactions   Contrast Media [Iodinated Contrast Media] Swelling    Skin Peeling and break out   Codeine Itching   Other     NO PLASTIC TAPE - Blisters and skin removal    MEDICATION LIST PRIOR TO VISIT: Current Meds  Medication Sig   amLODipine-valsartan (EXFORGE) 5-160 MG tablet Take 1 tablet by mouth at bedtime.   aspirin 81 MG chewable tablet Chew 1 tablet (81 mg total) by mouth 2 (two) times daily.   docusate sodium (COLACE) 100 MG capsule Take 100 mg by mouth daily after breakfast.   Insulin Pen Needle (B-D UF III MINI PEN NEEDLES) 31G X 5 MM  MISC 1 Device by Does not apply route in the morning, at noon, in the evening, and at bedtime.   isosorbide mononitrate (IMDUR) 30 MG 24 hr tablet Take 30 mg by mouth in the morning and at bedtime.   LANTUS SOLOSTAR 100 UNIT/ML Solostar Pen Inject 10 Units into the skin at bedtime.   levothyroxine (SYNTHROID) 150 MCG tablet Take 1 tablet (150 mcg total) by mouth daily before breakfast.   MITIGARE 0.6 MG CAPS Take 0.6 mg by mouth daily after breakfast.   nitroGLYCERIN (NITROSTAT) 0.4 MG SL tablet Place 1 tablet (0.4 mg total) under the tongue every 5 (five) minutes as needed for chest pain (up to 3 doses).   NOVOLOG FLEXPEN 100 UNIT/ML FlexPen Max Daily 30 units   OZEMPIC, 2 MG/DOSE, 8 MG/3ML SOPN Inject 2 mg into the skin once a week.   rosuvastatin (CRESTOR) 40 MG tablet Take 40 mg by mouth daily after breakfast.   traZODone (DESYREL) 50 MG tablet Take 50 mg by mouth at bedtime as needed for sleep.     PAST MEDICAL HISTORY: Past Medical History:  Diagnosis Date   ALLERGIC RHINITIS    Allergy to IVP dye    Arthritis    Coronary artery disease    Diabetes mellitus, type 2 (HCC)    History of tobacco abuse    Quit 2011   Hypertension    Hypertriglyceridemia    Hypothyroidism    NSTEMI (non-ST elevated myocardial infarction) (HCC) 09/30/2011   NO  CAD by cath, ? coronary vasospasm   Obesity    Sleep apnea     PAST SURGICAL HISTORY: Past Surgical History:  Procedure Laterality Date   BREAST BIOPSY Left 03/05/2022   [place] LT BREAST BX W LOC DEV 1ST LESION IMG BX SPEC US GUIDE 03/05/2022 [facility]   BREAST BIOPSY  04/25/2022   MM LT RADIOACTIVE SEED LOC MAMMO GUIDE 04/25/2022 [facility]   BREAST LUMPECTOMY WITH RADIOACTIVE SEED LOCALIZATION Left 04/26/2022   Procedure: LEFT BREAST LUMPECTOMY WITH RADIOACTIVE SEED LOCALIZATION;  Surgeon: Griselda Miner, MD;  Location: [facility];  Service: General;  Laterality: Left;   CORONARY STENT INTERVENTION N/A  08/19/2017   Procedure: CORONARY STENT INTERVENTION;  Surgeon: Yates Decamp, MD;  Location: [facility];  Service: Cardiovascular;  Laterality: N/A;   KNEE ARTHROSCOPY W/ MENISCAL REPAIR Left 2009   KNEE ARTHROSCOPY W/ MENISCAL REPAIR Right 2014   LEFT HEART CATH AND CORONARY ANGIOGRAPHY N/A 08/19/2017   Procedure: LEFT HEART CATH AND CORONARY ANGIOGRAPHY;  Surgeon: Yates Decamp, MD;  Location: [facility];  Service: Cardiovascular;  Laterality: N/A;   LEFT HEART CATHETERIZATION WITH CORONARY ANGIOGRAM N/A 09/30/2011   Procedure: LEFT HEART CATHETERIZATION WITH CORONARY ANGIOGRAM;  Surgeon: Kathleene Hazel, MD;  Location: [facility];  Service: Cardiovascular;  Laterality: N/A;   LUMBAR DISC SURGERY  11/27/2020   L4-5   TOTAL HIP ARTHROPLASTY Right 05/28/2022   Procedure: RIGHT TOTAL HIP ARTHROPLASTY ANTERIOR APPROACH;  Surgeon: [name], MD;  Location: [facility];  Service: Orthopedics;  Laterality: Right;   TUBAL LIGATION     VESICOVAGINAL FISTULA CLOSURE W/ TAH      FAMILY HISTORY: The patient family history includes Cancer in her maternal uncle; Diabetes in her brother, mother, and another family member; Heart disease in her mother; Hypertension in her brother and brother.  SOCIAL HISTORY:  The patient  reports that she quit smoking about 11 years ago. Her smoking use included cigarettes. She started smoking about 47 years ago. She has a 28.8 pack-year smoking history. She has never used smokeless tobacco. She reports that she does not currently use alcohol. She reports that she does not use drugs.  REVIEW OF SYSTEMS: Review of Systems  Constitutional: Positive for weight loss.  Cardiovascular:  Negative for chest pain, dyspnea on exertion, leg swelling, near-syncope, orthopnea, palpitations, paroxysmal nocturnal dyspnea and syncope.  Respiratory:  Negative for shortness of breath.   Musculoskeletal:  Positive for joint pain.    PHYSICAL EXAM: Today's  Vitals   09/24/22 0934  BP: 130/69  Pulse: 67  Resp: 16  SpO2: 99%  Weight: 255 lb (115.7 kg)  Height: 5\' 8"  (1.727 m)   Body mass index is 38.77 kg/m.  Physical Exam  Constitutional: No distress.  Age appropriate, hemodynamically stable, ambulates w/ cane  Neck: No JVD present.  Cardiovascular: Normal rate, regular rhythm, S1 normal, S2 normal, intact distal pulses and normal pulses. Exam reveals no gallop, no S3 and no S4.  No murmur heard. Pulmonary/Chest: Effort normal and breath sounds normal. No stridor. She has no wheezes. She has no rales.  Abdominal: Soft. Bowel sounds are normal. She exhibits no distension. There is no abdominal tenderness.  Musculoskeletal:        General: No edema.     Cervical back: Neck supple.  Neurological: She is alert and oriented to person, place, and time. She has intact cranial nerves (2-12).  Skin: Skin is warm and moist.  CARDIAC DATABASE: EKG: September 24, 2022: Sinus rhythm, 67 bpm, normal axis, without underlying ischemia injury pattern.  Echocardiogram: 06/08/2020: Technically difficult study. Left ventricle cavity is normal in size and wall thickness. Normal global wall motion. Normal LV systolic function with visual EF 50-55%. Doppler evidence of grade I (impaired) diastolic dysfunction, normal LAP. No significant valvular abnormality. Normal right atrial pressure. No significant change compared to previous study in 2019.   Stress Testing: Lexiscan Tetrofosmin stress test 06/07/2020: 1 Day Rest/Stress Protocol. Stress EKG is non-diagnostic, as this is pharmacological stress test using Lexiscan. No convincing evidence of reversible myocardial ischemia or prior infarct. Left ventricular wall thickness is preserved without regional wall motion abnormalities. Calculated LVEF  51%, visually appears preserved. No prior studies available for comparison.  Heart Catheterization: Coronary angiogram 08/19/2017: Normal LV systolic  function, EF 55%, normal LVEDP. Left main nonexistent, separate ostia for LAD and circumflex.  LAD has mild diffuse disease.  Circumflex is codominant with RCA and is more than normal. RCA is codominant with circumflex coronary artery.  Mid segment shows a ulcerated 80% stenosis S/P 3.0 x 20 mm Synergy, 80% to 0%, TIMI-3 to TIMI-3 flow maintained.   Recommend uninterrupted dual antiplatelet therapy with Aspirin 81mg  daily and Ticagrelor 90mg  twice daily for a minimum of 12 months (ACS - Class I recommendation).     LABORATORY DATA:    Latest Ref Rng & Units 05/29/2022    6:52 AM 05/21/2022   10:25 AM 11/28/2020    5:00 AM  CBC  WBC 4.0 - 10.5 K/uL 14.5  9.5  14.4   Hemoglobin 12.0 - 15.0 g/dL 16.1  09.6  04.5   Hematocrit 36.0 - 46.0 % 34.4  46.2  32.3   Platelets 150 - 400 K/uL 215  315  286        Latest Ref Rng & Units 05/29/2022    6:52 AM 05/21/2022   10:25 AM 04/22/2022   12:43 PM  CMP  Glucose 70 - 99 mg/dL 409  811  914   BUN 8 - 23 mg/dL 13  19  15    Creatinine 0.44 - 1.00 mg/dL 7.82  9.56  2.13   Sodium 135 - 145 mmol/L 137  140  141   Potassium 3.5 - 5.1 mmol/L 3.5  3.9  3.8   Chloride 98 - 111 mmol/L 104  107  108   CO2 22 - 32 mmol/L 23  23  25    Calcium 8.9 - 10.3 mg/dL 8.6  9.2  8.8   Total Protein 6.5 - 8.1 g/dL  7.4    Total Bilirubin 0.3 - 1.2 mg/dL  0.8    Alkaline Phos 38 - 126 U/L  61    AST 15 - 41 U/L  18    ALT 0 - 44 U/L  26      Lipid Panel     Component Value Date/Time   CHOL 92 03/16/2021 0934   TRIG 105.0 03/16/2021 0934   HDL 49.80 03/16/2021 0934   CHOLHDL 2 03/16/2021 0934   VLDL 21.0 03/16/2021 0934   LDLCALC 22 03/16/2021 0934    No components found for: "NTPROBNP" No results for input(s): "PROBNP" in the last 8760 hours. No results for input(s): "TSH" in the last 8760 hours.   BMP Recent Labs    04/22/22 1243 05/21/22 1025 05/29/22 0652  NA 141 140 137  K 3.8 3.9 3.5  CL 108 107 104  CO2 25 23 23   GLUCOSE 100*  130* 127*   BUN 15 19 13   CREATININE 0.85 1.11* 1.00  CALCIUM 8.8* 9.2 8.6*  GFRNONAA >60 57* >60    HEMOGLOBIN A1C Lab Results  Component Value Date   HGBA1C 6.3 (H) 05/21/2022   MPG 134.11 05/21/2022   External Labs: Collected: 04/21/2020 Creatinine 1.16 mg/dL. Sodium 139 potassium 4, chloride 105, bicarb 24 eGFR: 60 mL/min per 1.73 m Lipid profile: Total cholesterol 100, triglycerides 224 HDL 44, LDL 28, non-HDL 56 A1c 8.3 TSH: 0.18, free T4 1.4  External Labs: Collected: August 27, 2022 available in Care Everywhere under [facility]. Total cholesterol 103, triglycerides 191, HDL 52, LDL 25, non-HDL 51 BUN 17, creatinine 2.02. Sodium 143, potassium 4.1, chloride 111, bicarb 23. AST 19, ALT 26, alkaline phosphatase 68. Hemoglobin 12.6 g/dL. Hemoglobin A1c 6  TSH 0.40  IMPRESSION:    ICD-10-CM   1. Atherosclerosis of native coronary artery of native heart without angina pectoris  I25.10 EKG 12-Lead    2. History of coronary angioplasty with insertion of stent  Z95.5     3. Benign hypertension  I10     4. Type 2 diabetes mellitus with other circulatory complication, with long-term current use of insulin (HCC)  E11.59    Z79.4     5. Mixed hyperlipidemia  E78.2     6. Former smoker  Z87.891        RECOMMENDATIONS: [name] is a 62 y.o. female whose past medical history and cardiac risk factors include: Hypertension, hyperlipidemia, insulin dependent diabetes mellitus type 2, family history of premature CAD (mom had PCI's at the age of 51), established CAD status post PCI to the RCA, former smoker, OSA on CPAP, obesity due to excess calories.  Atherosclerosis of native coronary artery of native heart without angina pectoris History of coronary angioplasty with insertion of stent Denies anginal chest pain. EKG normal sinus rhythm without ectopy or ischemia. Medications reconciled. Outside labs independently reviewed. LDL is very  well-controlled. Triglycerides are not well-controlled but I suspect it was nonfasting-patient agrees. Reemphasized the importance of secondary prevention with focus on improving her modifiable cardiovascular risk factors such as glycemic control, lipid management, blood pressure control, weight loss.  Benign hypertension Office blood pressures are well-controlled on current medical therapy. No changes warranted.  Type 2 diabetes mellitus with other circulatory complication, with long-term current use of insulin (HCC) Reemphasized importance of glycemic control. Currently on insulin, Ozempic, ARB, statin therapy.  Recommended 1 year follow-up visit given her history of CAD and prior coronary interventions.  She has worked very hard on improving her modifiable cardiovascular risk factors.  However patient feels more comfortable following up every 6 months.  FINAL MEDICATION LIST END OF ENCOUNTER: No orders of the defined types were placed in this encounter.   Medications Discontinued During This Encounter  Medication Reason   amoxicillin (AMOXIL) 500 MG tablet    cyanocobalamin (VITAMIN B12) 1000 MCG tablet    methocarbamol (ROBAXIN) 500 MG tablet      Current Outpatient Medications:    amLODipine-valsartan (EXFORGE) 5-160 MG tablet, Take 1 tablet by mouth at bedtime., Disp: , Rfl:    aspirin 81 MG chewable tablet, Chew 1 tablet (81 mg total) by mouth 2 (two) times daily., Disp: 35 tablet, Rfl: 0   docusate sodium (COLACE) 100 MG capsule, Take 100 mg by mouth daily after breakfast., Disp: , Rfl:    Insulin Pen Needle (B-D UF III MINI PEN NEEDLES) 31G X 5 MM MISC, 1  Device by Does not apply route in the morning, at noon, in the evening, and at bedtime., Disp: 400 each, Rfl: 3   isosorbide mononitrate (IMDUR) 30 MG 24 hr tablet, Take 30 mg by mouth in the morning and at bedtime., Disp: , Rfl:    LANTUS SOLOSTAR 100 UNIT/ML Solostar Pen, Inject 10 Units into the skin at bedtime., Disp: 15  mL, Rfl: 3   levothyroxine (SYNTHROID) 150 MCG tablet, Take 1 tablet (150 mcg total) by mouth daily before breakfast., Disp: 90 tablet, Rfl: 3   MITIGARE 0.6 MG CAPS, Take 0.6 mg by mouth daily after breakfast., Disp: , Rfl:    nitroGLYCERIN (NITROSTAT) 0.4 MG SL tablet, Place 1 tablet (0.4 mg total) under the tongue every 5 (five) minutes as needed for chest pain (up to 3 doses)., Disp: 25 tablet, Rfl: 3   NOVOLOG FLEXPEN 100 UNIT/ML FlexPen, Max Daily 30 units, Disp: 30 mL, Rfl: 3   OZEMPIC, 2 MG/DOSE, 8 MG/3ML SOPN, Inject 2 mg into the skin once a week., Disp: 9 mL, Rfl: 3   rosuvastatin (CRESTOR) 40 MG tablet, Take 40 mg by mouth daily after breakfast., Disp: , Rfl:    traZODone (DESYREL) 50 MG tablet, Take 50 mg by mouth at bedtime as needed for sleep., Disp: , Rfl:    metoprolol succinate (TOPROL-XL) 50 MG 24 hr tablet, Take 50 mg by mouth daily after breakfast., Disp: , Rfl:    oxyCODONE (OXY IR/ROXICODONE) 5 MG immediate release tablet, Take 1-2 tablets (5-10 mg total) by mouth every 4 (four) hours as needed for moderate pain (pain score 4-6)., Disp: 30 tablet, Rfl: 0  Orders Placed This Encounter  Procedures   EKG 12-Lead     There are no Patient Instructions on file for this visit.   --Continue cardiac medications as reconciled in final medication list. --Return in about 6 months (around 03/27/2023) for Follow up, CAD. Or sooner if needed. --Continue follow-up with your primary care physician regarding the management of your other chronic comorbid conditions.  Patient's questions and concerns were addressed to her satisfaction. She voices understanding of the instructions provided during this encounter.   This note was created using a voice recognition software as a result there may be grammatical errors inadvertently enclosed that do not reflect the nature of this encounter. Every attempt is made to correct such errors.  Tessa Lerner, [place], [facility]  Pager:  [phone] Office:  [phone]

## 2022-11-21 ENCOUNTER — Encounter: Payer: Self-pay | Admitting: Internal Medicine

## 2022-11-21 ENCOUNTER — Ambulatory Visit (INDEPENDENT_AMBULATORY_CARE_PROVIDER_SITE_OTHER): Payer: Medicare HMO | Admitting: Internal Medicine

## 2022-11-21 VITALS — BP 134/72 | HR 69 | Ht 68.0 in | Wt 250.0 lb

## 2022-11-21 DIAGNOSIS — E039 Hypothyroidism, unspecified: Secondary | ICD-10-CM

## 2022-11-21 DIAGNOSIS — Z794 Long term (current) use of insulin: Secondary | ICD-10-CM

## 2022-11-21 DIAGNOSIS — E1159 Type 2 diabetes mellitus with other circulatory complications: Secondary | ICD-10-CM

## 2022-11-21 DIAGNOSIS — E1142 Type 2 diabetes mellitus with diabetic polyneuropathy: Secondary | ICD-10-CM | POA: Diagnosis not present

## 2022-11-21 LAB — LIPID PANEL
Cholesterol: 106 mg/dL (ref 0–200)
HDL: 58.5 mg/dL (ref >39.00)
LDL Cholesterol: 31 mg/dL (ref 0–99)
NonHDL: 47.13
Total CHOL/HDL Ratio: 2
Triglycerides: 79 mg/dL (ref 0.0–149.0)
VLDL: 15.8 mg/dL (ref 0.0–40.0)

## 2022-11-21 LAB — TSH: TSH: 0.23 u[IU]/mL — ABNORMAL LOW (ref 0.35–5.50)

## 2022-11-21 LAB — POCT GLYCOSYLATED HEMOGLOBIN (HGB A1C): Hemoglobin A1C: 6.2 % — AB (ref 4.0–5.6)

## 2022-11-21 LAB — BASIC METABOLIC PANEL
BUN: 20 mg/dL (ref 6–23)
CO2: 25 meq/L (ref 19–32)
Calcium: 9.4 mg/dL (ref 8.4–10.5)
Chloride: 109 meq/L (ref 96–112)
Creatinine, Ser: 0.95 mg/dL (ref 0.40–1.20)
GFR: 64.23 mL/min (ref >60.00)
Glucose, Bld: 106 mg/dL — ABNORMAL HIGH (ref 70–99)
Potassium: 4.2 meq/L (ref 3.5–5.1)
Sodium: 143 meq/L (ref 135–145)

## 2022-11-21 LAB — MICROALBUMIN / CREATININE URINE RATIO
Creatinine,U: 147.9 mg/dL
Microalb Creat Ratio: 0.6 mg/g (ref 0.0–30.0)
Microalb, Ur: 1 mg/dL (ref 0.0–1.9)

## 2022-11-21 MED ORDER — LANTUS SOLOSTAR 100 UNIT/ML ~~LOC~~ SOPN: 8.0000 [IU] | PEN_INJECTOR | Freq: Every day | SUBCUTANEOUS | 3 refills | Status: DC

## 2022-11-21 MED ORDER — BD PEN NEEDLE MINI U/F 31G X 5 MM MISC: 1.0000 | Freq: Four times a day (QID) | 3 refills | Status: AC

## 2022-11-21 MED ORDER — NOVOLOG FLEXPEN 100 UNIT/ML ~~LOC~~ SOPN: PEN_INJECTOR | SUBCUTANEOUS | 3 refills | Status: AC

## 2022-11-21 MED ORDER — OZEMPIC (2 MG/DOSE) 8 MG/3ML ~~LOC~~ SOPN: 2.0000 mg | PEN_INJECTOR | SUBCUTANEOUS | 3 refills | Status: DC

## 2022-11-21 NOTE — Patient Instructions (Addendum)
-   Decrease  Lantus 8 daily - Continue  NOvolog 2-6 units with each meal  - Continue Ozempic 2 mg weekly  -Novolog correctional insulin: ADD extra units on insulin to your meal-time Novolog dose if your blood sugars are higher than 165. Use the scale below to help guide you:   Blood sugar before meal Number of units to inject  Less than 165 0 unit  166 -  200 1 units  201 -  235 2 units  236 -  270 3 units  271 -  305 4 units  306 -  340 5 units  341 -  375 6 units  376 -  410 7 units      HOW TO TREAT LOW BLOOD SUGARS (Blood sugar LESS THAN 70 MG/DL) Please follow the RULE OF 15 for the treatment of hypoglycemia treatment (when your (blood sugars are less than 70 mg/dL)   STEP 1: Take 15 grams of carbohydrates when your blood sugar is low, which includes:  3-4 GLUCOSE TABS  OR 3-4 OZ OF JUICE OR REGULAR SODA OR ONE TUBE OF GLUCOSE GEL    STEP 2: RECHECK blood sugar in 15 MINUTES STEP 3: If your blood sugar is still low at the 15 minute recheck --> then, go back to STEP 1 and treat AGAIN with another 15 grams of carbohydrates.

## 2022-11-21 NOTE — Progress Notes (Addendum)
Name: [name]  MRN/ DOB: [id number], Apr 07, 1960   Age/ Sex: 62 y.o., female    PCP: Jackie Plum, MD   Reason for Endocrinology Evaluation: Type 2 Diabetes Mellitus     Date of Initial Endocrinology Visit: 03/16/2021    PATIENT IDENTIFIER: [name] is a 62 y.o. female with a past medical history of T2DM, HTN, dyslipidemia and CAD (status post PCI). The patient presented for initial endocrinology clinic visit on 03/16/2021 for consultative assistance with her diabetes management.    HPI: [name] was    Diagnosed with DM 2013 Prior Medications tried/Intolerance: Metformin- diarrhea  as well as hand and feet swelling. Ozempic - no  intolerance           Hemoglobin A1c has ranged from 7.5% in 2022, peaking at 11.5% in 2013.  No prior hx of pancreatitis     On her initial visit to our clinic her A1c was 7.5%, we adjusted MDI regimen and started Jardiance.  Trulicity was continued    THYROID HISTORY: She has been diagnosed with hypothyroid years ago ~ 2013. No prior neck sx or radiation.   Denies FH of thyroid disease   SUBJECTIVE:   During the last visit (05/22/2022): A1c 6.3 %   Today (11/21/22): [name] is here for follow-up on diabetes management.she checks her blood sugars 1-2 times daily.She does endorse hypoglycemia as low as 65 mg/dL , typicaly occur in the afternoon    She is s/p left breast lumpectomy with radioactive seed localization 04/2022 with benign papilloma She is s/p right hip arthroplasty   05/2022  She had a follow-up with cardiology 09/24/2022 for CAD and dyslipidemia  Denies nausea, vomiting  Denies constipation or diarrhea Denies local neck swelling  Denies palpitations  Denies tremors   Has chronic Right LE , has hx of back sx  She has been exercising   Dexcom is cost prohibitive     HOME ENDOCRINE REGIMEN: Lantus 10 units once daily  NovoLog 2-6 units TID QAC Ozempic 2 mg weekly Levothyroxine 150 mcg  daily    Statin: Yes ACE-I/ARB: No Prior Diabetic Education: yes   METER DOWNLOAD SUMMARY:  This a.m. 107 Mg/DL   40-981 mg/dL    DIABETIC COMPLICATIONS: Microvascular complications:  Neuropathy  Denies: retinopathy, CKD  Last eye exam: Completed 01/10/2022  Macrovascular complications:  CAD(status post PCI)  Denies: PVD, CVA   PAST HISTORY: Past Medical History:  Past Medical History:  Diagnosis Date   ALLERGIC RHINITIS    Allergy to IVP dye    Arthritis    Coronary artery disease    Diabetes mellitus, type 2 (HCC)    History of tobacco abuse    Quit 2011   Hypertension    Hypertriglyceridemia    Hypothyroidism    NSTEMI (non-ST elevated myocardial infarction) (HCC) 09/30/2011   NO CAD by cath, ? coronary vasospasm   Obesity    Sleep apnea    Past Surgical History:  Past Surgical History:  Procedure Laterality Date   BREAST BIOPSY Left 03/05/2022   [place] LT BREAST BX W LOC DEV 1ST LESION IMG BX SPEC US GUIDE 03/05/2022 [facility]   BREAST BIOPSY  04/25/2022   MM LT RADIOACTIVE SEED LOC MAMMO GUIDE 04/25/2022 [facility]   BREAST LUMPECTOMY WITH RADIOACTIVE SEED LOCALIZATION Left 04/26/2022   Procedure: LEFT BREAST LUMPECTOMY WITH RADIOACTIVE SEED LOCALIZATION;  Surgeon: Griselda Miner, MD;  Location: [facility];  Service: General;  Laterality: Left;  CORONARY STENT INTERVENTION N/A 08/19/2017   Procedure: CORONARY STENT INTERVENTION;  Surgeon: Yates Decamp, MD;  Location: [facility];  Service: Cardiovascular;  Laterality: N/A;   KNEE ARTHROSCOPY W/ MENISCAL REPAIR Left 2009   KNEE ARTHROSCOPY W/ MENISCAL REPAIR Right 2014   LEFT HEART CATH AND CORONARY ANGIOGRAPHY N/A 08/19/2017   Procedure: LEFT HEART CATH AND CORONARY ANGIOGRAPHY;  Surgeon: Yates Decamp, MD;  Location: [facility];  Service: Cardiovascular;  Laterality: N/A;   LEFT HEART CATHETERIZATION WITH CORONARY ANGIOGRAM N/A 09/30/2011   Procedure: LEFT HEART  CATHETERIZATION WITH CORONARY ANGIOGRAM;  Surgeon: Kathleene Hazel, MD;  Location: [facility] CATH [facility];  Service: Cardiovascular;  Laterality: N/A;   LUMBAR DISC SURGERY  11/27/2020   L4-5   TOTAL HIP ARTHROPLASTY Right 05/28/2022   Procedure: RIGHT TOTAL HIP ARTHROPLASTY ANTERIOR APPROACH;  Surgeon: [name], MD;  Location: [facility] OR;  Service: Orthopedics;  Laterality: Right;   TUBAL LIGATION     VESICOVAGINAL FISTULA CLOSURE W/ TAH      Social History:  reports that she quit smoking about 11 years ago. Her smoking use included cigarettes. She started smoking about 47 years ago. She has a 28.8 pack-year smoking history. She has never used smokeless tobacco. She reports that she does not currently use alcohol. She reports that she does not use drugs. Family History:  Family History  Problem Relation Age of Onset   Heart disease Mother        has pacemaker   Diabetes Mother    Hypertension Brother    Diabetes Brother    Hypertension Brother    Cancer Maternal Uncle    Diabetes Other        strong family history     HOME MEDICATIONS: Allergies as of 11/21/2022       Reactions   Contrast Media [iodinated Contrast Media] Swelling   Skin Peeling and break out   Codeine Itching   Other    NO PLASTIC TAPE - Blisters and skin removal        Medication List        Accurate as of November 21, 2022  9:51 AM. If you have any questions, ask your nurse or doctor.          STOP taking these medications    oxyCODONE 5 MG immediate release tablet Commonly known as: Oxy IR/ROXICODONE Stopped by: Johnney Ou Kiya Eno       TAKE these medications    amLODipine-valsartan 5-160 MG tablet Commonly known as: EXFORGE Take 1 tablet by mouth at bedtime.   aspirin 81 MG chewable tablet Chew 1 tablet (81 mg total) by mouth 2 (two) times daily.   B-D UF III MINI PEN NEEDLES 31G X 5 MM Misc Generic drug: Insulin Pen Needle 1 Device by Does not apply route in the morning,  at noon, in the evening, and at bedtime.   docusate sodium 100 MG capsule Commonly known as: COLACE Take 100 mg by mouth daily after breakfast.   hydrOXYzine 25 MG capsule Commonly known as: VISTARIL Take 25-50 mg by mouth at bedtime as needed.   isosorbide mononitrate 30 MG 24 hr tablet Commonly known as: IMDUR Take 30 mg by mouth in the morning and at bedtime.   Lantus SoloStar 100 UNIT/ML Solostar Pen Generic drug: insulin glargine Inject 10 Units into the skin at bedtime.   levothyroxine 150 MCG tablet Commonly known as: SYNTHROID Take 1 tablet (150 mcg total) by mouth daily before  breakfast.   metoprolol succinate 50 MG 24 hr tablet Commonly known as: TOPROL-XL Take 50 mg by mouth daily after breakfast.   Mitigare 0.6 MG Caps Generic drug: Colchicine Take 0.6 mg by mouth daily after breakfast.   nitroGLYCERIN 0.4 MG SL tablet Commonly known as: NITROSTAT Place 1 tablet (0.4 mg total) under the tongue every 5 (five) minutes as needed for chest pain (up to 3 doses).   NovoLOG FlexPen 100 UNIT/ML FlexPen Generic drug: insulin aspart Max Daily 30 units   ondansetron 8 MG disintegrating tablet Commonly known as: ZOFRAN-ODT 1 tab(s) orally 2 times a day as needed   Ozempic (2 MG/DOSE) 8 MG/3ML Sopn Generic drug: Semaglutide (2 MG/DOSE) Inject 2 mg into the skin once a week.   rosuvastatin 40 MG tablet Commonly known as: CRESTOR Take 40 mg by mouth daily after breakfast.   traZODone 50 MG tablet Commonly known as: DESYREL Take 50 mg by mouth at bedtime as needed for sleep.         ALLERGIES: Allergies  Allergen Reactions   Contrast Media [Iodinated Contrast Media] Swelling    Skin Peeling and break out   Codeine Itching   Other     NO PLASTIC TAPE - Blisters and skin removal     REVIEW OF SYSTEMS: A comprehensive ROS was conducted with the patient and is negative except as per HPI and    OBJECTIVE:   VITAL SIGNS: BP 134/72 (BP Location: Left  Arm, Patient Position: Sitting, Cuff Size: Large)   Pulse 69   Ht 5\' 8"  (1.727 m)   Wt 250 lb (113.4 kg)   SpO2 99%   BMI 38.01 kg/m    PHYSICAL EXAM:  General: Pt appears well and is in NAD  Neck: General: Supple without adenopathy or carotid bruits. Thyroid: Thyroid size normal.  No goiter or nodules appreciated.   Lungs: Clear with good BS bilat   Heart: RRR   Extremities:  Lower extremities - No pretibial edema.  Neuro: MS is good with appropriate affect, pt is alert and Ox3   DM Foot Exam 11/21/2022   The skin of the feet is intact without sores or ulcerations. The pedal pulses are 2+ on right and 2+ on left. The sensation is intact to a screening 5.07, 10 gram monofilament bilaterally   DATA REVIEWED:  Lab Results  Component Value Date   HGBA1C 6.3 (H) 05/21/2022   HGBA1C 6.7 (A) 11/19/2021   HGBA1C 6.9 (A) 07/23/2021    Latest Reference Range & Units 11/21/22 10:20  Sodium 135 - 145 mEq/L 143  Potassium 3.5 - 5.1 mEq/L 4.2  Chloride 96 - 112 mEq/L 109  CO2 19 - 32 mEq/L 25  Glucose 70 - 99 mg/dL 782 (H)  BUN 6 - 23 mg/dL 20  Creatinine 9.56 - 2.13 mg/dL 0.86  Calcium 8.4 - 57.8 mg/dL 9.4  GFR >46.96 mL/min 64.23  Total CHOL/HDL Ratio  2  Cholesterol 0 - 200 mg/dL 295  HDL Cholesterol >28.41 mg/dL 32.44  LDL (calc) 0 - 99 mg/dL 31  MICROALB/CREAT RATIO 0.0 - 30.0 mg/g 0.6  NonHDL  47.13  Triglycerides 0.0 - 149.0 mg/dL 01.0  VLDL 0.0 - 27.2 mg/dL 53.6  TSH 6.44 - 0.34 uIU/mL 0.23 (L)    Latest Reference Range & Units 11/21/22 10:20  Creatinine,U mg/dL 742.5  Microalb, Ur 0.0 - 1.9 mg/dL 1.0  MICROALB/CREAT RATIO 0.0 - 30.0 mg/g 0.6    ASSESSMENT / PLAN / RECOMMENDATIONS:   1)  Type 2 Diabetes Mellitus, Optimally controlled, With Neuropathic and  macrovascular complications - Most recent A1c of 6.2%. Goal A1c <7.0%.    - Praised the pt on optimizing glucose control  - Intolerant to Jardiance due to recurrent genital infections  - Dexcom cost  prohibitive  - Due to Hypoglycemia, will decrease Lantus as below  -No change to Novolog dose as this time    MEDICATIONS: Decrease  Lantus 8 units daily Continue  NovoLog to 2-6 units 3 times daily with each meal Continue  Ozempic 2 mg mg weekly Continue correction factor: NovoLog (BG -130/35)   EDUCATION / INSTRUCTIONS: BG monitoring instructions: Patient is instructed to check her blood sugars 3 times a day Call [facility] if: BG persistently < 70  I reviewed the Rule of 15 for the treatment of hypoglycemia in detail with the patient. Literature supplied.   2) Diabetic complications:  Eye: Does not have known diabetic retinopathy.  Neuro/ Feet: Does  have known diabetic peripheral neuropathy. Renal: Patient does not have known baseline CKD, but her GFR fluctuates. She is not on an ACEI/ARB at present.  3) Hypothyroidism:  -Patient is clinically thyroid -TSH is low, will decrease levothyroxine as below  Medication Decrease levothyroxine 150 mcg, half a tablet on Sundays, and 1 tablet the rest of the week  F/U in 6 months     Signed electronically by: Lyndle Herrlich, MD  [facility]  [facility] [address] [place] [place], [place] [address] Phone: [phone] FAX: [phone]   CC: Jackie Plum, MD [address] [place] [address] Phone: [phone]  Fax: [phone]    Return to Endocrinology clinic as below: Future Appointments  Date Time Provider Department Center  01/15/2023  8:45 AM [name], MD [facility] None  03/31/2023  8:20 AM Tessa Lerner, DO [id number]

## 2022-11-22 ENCOUNTER — Telehealth: Payer: Self-pay | Admitting: Internal Medicine

## 2022-11-22 NOTE — Telephone Encounter (Signed)
Also sent mychart message as well

## 2022-11-22 NOTE — Telephone Encounter (Signed)
Can you please let the patient know that her thyroid test shows she is on too much levothyroxine.  Please asked the patient to change levothyroxine by taking half a tablet only on Sundays, but continue to take 1 full tablet Monday through Saturday.   Please schedule the patient for repeat thyroid test in 2 months    Kidney function, urine test and cholesterol are all within normal range    Thanks

## 2022-11-22 NOTE — Addendum Note (Signed)
Addended by: Scarlette Shorts on: 11/22/2022 11:38 AM   Modules accepted: Orders

## 2022-11-22 NOTE — Telephone Encounter (Signed)
LMTCB

## 2022-11-25 NOTE — Telephone Encounter (Signed)
LMTCB

## 2022-12-16 DIAGNOSIS — E782 Mixed hyperlipidemia: Secondary | ICD-10-CM | POA: Diagnosis not present

## 2022-12-16 DIAGNOSIS — Z720 Tobacco use: Secondary | ICD-10-CM | POA: Diagnosis not present

## 2022-12-16 DIAGNOSIS — I2510 Atherosclerotic heart disease of native coronary artery without angina pectoris: Secondary | ICD-10-CM | POA: Diagnosis not present

## 2022-12-16 DIAGNOSIS — E1165 Type 2 diabetes mellitus with hyperglycemia: Secondary | ICD-10-CM | POA: Diagnosis not present

## 2022-12-16 DIAGNOSIS — I10 Essential (primary) hypertension: Secondary | ICD-10-CM | POA: Diagnosis not present

## 2022-12-16 DIAGNOSIS — J302 Other seasonal allergic rhinitis: Secondary | ICD-10-CM | POA: Diagnosis not present

## 2022-12-16 DIAGNOSIS — E039 Hypothyroidism, unspecified: Secondary | ICD-10-CM | POA: Diagnosis not present

## 2022-12-16 DIAGNOSIS — Z794 Long term (current) use of insulin: Secondary | ICD-10-CM | POA: Diagnosis not present

## 2022-12-16 DIAGNOSIS — Z6839 Body mass index (BMI) 39.0-39.9, adult: Secondary | ICD-10-CM | POA: Diagnosis not present

## 2022-12-17 DIAGNOSIS — E039 Hypothyroidism, unspecified: Secondary | ICD-10-CM | POA: Diagnosis not present

## 2023-01-01 ENCOUNTER — Other Ambulatory Visit: Payer: Self-pay | Admitting: Internal Medicine

## 2023-01-14 DIAGNOSIS — E119 Type 2 diabetes mellitus without complications: Secondary | ICD-10-CM | POA: Diagnosis not present

## 2023-01-15 ENCOUNTER — Ambulatory Visit (INDEPENDENT_AMBULATORY_CARE_PROVIDER_SITE_OTHER): Payer: Medicare HMO | Admitting: Orthopaedic Surgery

## 2023-01-15 ENCOUNTER — Encounter: Payer: Self-pay | Admitting: Orthopaedic Surgery

## 2023-01-15 ENCOUNTER — Other Ambulatory Visit (INDEPENDENT_AMBULATORY_CARE_PROVIDER_SITE_OTHER): Payer: Self-pay

## 2023-01-15 DIAGNOSIS — Z96641 Presence of right artificial hip joint: Secondary | ICD-10-CM | POA: Diagnosis not present

## 2023-01-15 NOTE — Progress Notes (Signed)
The patient is here today just over 7 months status post a right total hip arthroplasty.  She reports that she has good range of motion and strength of the right hip and some numbness in the right thigh like a nerve type of pain she describes.  She has had extensive lumbar spine surgery in the past.  She says she is doing great and has lost weight.  She is a diabetic but reports good blood glucose control.  She is very pleased overall.  She reports good range of motion and strength of the hip and states that she does not workout on a regular basis.  She denies any left hip pain.  Both hips move smoothly and fluidly.  She walks without a limp.  Her leg lengths appear equal.  Standing AP pelvis and lateral of the right hip shows a well-seated right total hip arthroplasty.  There are no complicating features of the joint replacement.  Her left hip has some mild arthritic changes.  At this point follow-up can be as needed.  If she does develop any issues with that hip or any other musculoskeletal complaints she knows to let us know.

## 2023-01-20 DIAGNOSIS — Z794 Long term (current) use of insulin: Secondary | ICD-10-CM | POA: Diagnosis not present

## 2023-01-20 DIAGNOSIS — E1165 Type 2 diabetes mellitus with hyperglycemia: Secondary | ICD-10-CM | POA: Diagnosis not present

## 2023-01-20 DIAGNOSIS — I10 Essential (primary) hypertension: Secondary | ICD-10-CM | POA: Diagnosis not present

## 2023-01-20 DIAGNOSIS — Z720 Tobacco use: Secondary | ICD-10-CM | POA: Diagnosis not present

## 2023-01-20 DIAGNOSIS — I2510 Atherosclerotic heart disease of native coronary artery without angina pectoris: Secondary | ICD-10-CM | POA: Diagnosis not present

## 2023-01-20 DIAGNOSIS — G4700 Insomnia, unspecified: Secondary | ICD-10-CM | POA: Diagnosis not present

## 2023-01-20 DIAGNOSIS — E039 Hypothyroidism, unspecified: Secondary | ICD-10-CM | POA: Diagnosis not present

## 2023-01-20 DIAGNOSIS — E782 Mixed hyperlipidemia: Secondary | ICD-10-CM | POA: Diagnosis not present

## 2023-01-24 ENCOUNTER — Other Ambulatory Visit: Payer: Self-pay

## 2023-01-24 ENCOUNTER — Other Ambulatory Visit: Payer: Medicare HMO

## 2023-01-24 DIAGNOSIS — E039 Hypothyroidism, unspecified: Secondary | ICD-10-CM

## 2023-02-03 DIAGNOSIS — H52209 Unspecified astigmatism, unspecified eye: Secondary | ICD-10-CM | POA: Diagnosis not present

## 2023-02-03 DIAGNOSIS — H524 Presbyopia: Secondary | ICD-10-CM | POA: Diagnosis not present

## 2023-02-03 DIAGNOSIS — H5213 Myopia, bilateral: Secondary | ICD-10-CM | POA: Diagnosis not present

## 2023-03-18 DIAGNOSIS — E1165 Type 2 diabetes mellitus with hyperglycemia: Secondary | ICD-10-CM | POA: Diagnosis not present

## 2023-03-18 DIAGNOSIS — E782 Mixed hyperlipidemia: Secondary | ICD-10-CM | POA: Diagnosis not present

## 2023-03-18 DIAGNOSIS — Z720 Tobacco use: Secondary | ICD-10-CM | POA: Diagnosis not present

## 2023-03-18 DIAGNOSIS — Z794 Long term (current) use of insulin: Secondary | ICD-10-CM | POA: Diagnosis not present

## 2023-03-18 DIAGNOSIS — E039 Hypothyroidism, unspecified: Secondary | ICD-10-CM | POA: Diagnosis not present

## 2023-03-18 DIAGNOSIS — G4700 Insomnia, unspecified: Secondary | ICD-10-CM | POA: Diagnosis not present

## 2023-03-18 DIAGNOSIS — I2510 Atherosclerotic heart disease of native coronary artery without angina pectoris: Secondary | ICD-10-CM | POA: Diagnosis not present

## 2023-03-18 DIAGNOSIS — I10 Essential (primary) hypertension: Secondary | ICD-10-CM | POA: Diagnosis not present

## 2023-03-19 ENCOUNTER — Telehealth: Payer: Self-pay

## 2023-03-19 ENCOUNTER — Other Ambulatory Visit (HOSPITAL_COMMUNITY): Payer: Self-pay

## 2023-03-19 NOTE — Telephone Encounter (Signed)
Pharmacy Patient Advocate Encounter   Received notification from CoverMyMeds that prior authorization for Ozempic (2 MG/DOSE) 8MG /3ML pen-injectors is required/requested.   Insurance verification completed.   The patient is insured through [employer] .   Per test claim: The current 28 day co-pay is, $0.00.  No PA needed at this time. This test claim was processed through [facility]- copay amounts may vary at other pharmacies due to pharmacy/plan contracts, or as the patient moves through the different stages of their insurance plan.

## 2023-03-28 ENCOUNTER — Ambulatory Visit: Payer: Self-pay | Admitting: Cardiology

## 2023-03-31 ENCOUNTER — Encounter: Payer: Self-pay | Admitting: Cardiology

## 2023-03-31 ENCOUNTER — Ambulatory Visit: Payer: Medicare HMO | Attending: Cardiology | Admitting: Cardiology

## 2023-03-31 VITALS — BP 120/68 | HR 68 | Ht 68.0 in | Wt 250.0 lb

## 2023-03-31 DIAGNOSIS — Z794 Long term (current) use of insulin: Secondary | ICD-10-CM

## 2023-03-31 DIAGNOSIS — I2510 Atherosclerotic heart disease of native coronary artery without angina pectoris: Secondary | ICD-10-CM | POA: Diagnosis not present

## 2023-03-31 DIAGNOSIS — Z87891 Personal history of nicotine dependence: Secondary | ICD-10-CM

## 2023-03-31 DIAGNOSIS — Z955 Presence of coronary angioplasty implant and graft: Secondary | ICD-10-CM | POA: Diagnosis not present

## 2023-03-31 DIAGNOSIS — E1159 Type 2 diabetes mellitus with other circulatory complications: Secondary | ICD-10-CM | POA: Diagnosis not present

## 2023-03-31 DIAGNOSIS — I10 Essential (primary) hypertension: Secondary | ICD-10-CM | POA: Diagnosis not present

## 2023-03-31 DIAGNOSIS — E782 Mixed hyperlipidemia: Secondary | ICD-10-CM | POA: Diagnosis not present

## 2023-03-31 NOTE — Progress Notes (Signed)
 Cardiology Office Note:  .   Date:  03/31/2023  ID:  [name], DOB [birth year], MRN [id number] PCP:  Jackie Plum, MD  Former Cardiology Providers: Dr. Clifton James, Dr. Elease Hashimoto, Dr. Jacinto Halim, Dr. Deno Etienne, Dr. Wende Bushy  [facility] Providers Cardiologist:  Tessa Lerner, DO , [facility] (established care 06/01/2020) Electrophysiologist:  None  Click to update primary MD,subspecialty MD or APP then REFRESH:1}    Chief Complaint  Patient presents with   Atherosclerosis of native coronary artery of native heart w   Follow-up    6 months    History of Present Illness: [name] Kitchen   MONIFAH FREEHLING is a 63 y.o.  female whose past medical history and cardiovascular risk factors includes: Hypertension, hyperlipidemia, insulin dependent diabetes mellitus type 2, family history of premature CAD (mom had PCI's at the age of 78), established CAD status post PCI to the RCA, former smoker, OSA on CPAP, obesity due to excess calories.   Patient presents today for follow-up given her history of coronary artery disease status post intervention.  Since last office visit patient is doing well from a cardiovascular standpoint. She denies anginal chest pain or heart failure symptoms.   Overall functional capacity remains stable-walks at least 1 mile Monday Wednesday Friday on a treadmill at the local gym  Review of Systems: .   Review of Systems  Constitutional: Positive for weight loss.  Cardiovascular:  Negative for chest pain, dyspnea on exertion, leg swelling, near-syncope, orthopnea, palpitations, paroxysmal nocturnal dyspnea and syncope.  Respiratory:  Negative for shortness of breath.   Musculoskeletal:  Positive for joint pain.    Studies Reviewed:   EKG: EKG Interpretation Date/Time:  Monday March 31 2023 08:41:26 EST Ventricular Rate:  65 PR Interval:  170 QRS Duration:  80 QT Interval:  416 QTC Calculation: 432 R Axis:   33  Text Interpretation: Normal sinus rhythm Cannot rule out  Anterior infarct , age undetermined When compared with ECG of 10-Mar-2018 19:08, QT has shortened No significant change since last tracing Confirmed by Tessa Lerner [phone]) on 03/31/2023 8:48:47 AM  Echocardiogram: 06/08/2020: Technically difficult study. Left ventricle cavity is normal in size and wall thickness. Normal global wall motion. Normal LV systolic function with visual EF 50-55%. Doppler evidence of grade I (impaired) diastolic dysfunction, normal LAP. No significant valvular abnormality. Normal right atrial pressure. No significant change compared to previous study in 2019.   Stress Testing: Lexiscan Tetrofosmin stress test 06/07/2020: Low Risk Stres.s test    Heart Catheterization: Coronary angiogram 08/19/2017: Normal LV systolic function, EF 55%, normal LVEDP. Left main nonexistent, separate ostia for LAD and circumflex.  LAD has mild diffuse disease.  Circumflex is codominant with RCA and is more than normal. RCA is codominant with circumflex coronary artery.  Mid segment shows a ulcerated 80% stenosis S/P 3.0 x 20 mm Synergy, 80% to 0%, TIMI-3 to TIMI-3 flow maintained.   Recommend uninterrupted dual antiplatelet therapy with Aspirin 81mg  daily and Ticagrelor 90mg  twice daily for a minimum of 12 months (ACS - Class I recommendation).    RADIOLOGY: NA  Risk Assessment/Calculations:   NA   Labs:       Latest Ref Rng & Units 05/29/2022    6:52 AM 05/21/2022   10:25 AM 11/28/2020    5:00 AM  CBC  WBC 4.0 - 10.5 K/uL 14.5  9.5  14.4   Hemoglobin 12.0 - 15.0 g/dL 36.6  44.0  34.7   Hematocrit 36.0 - 46.0 % 34.4  46.2  32.3   Platelets 150 - 400 K/uL 215  315  286        Latest Ref Rng & Units 11/21/2022   10:20 AM 05/29/2022    6:52 AM 05/21/2022   10:25 AM  BMP  Glucose 70 - 99 mg/dL 409  811  914   BUN 6 - 23 mg/dL 20  13  19    Creatinine 0.40 - 1.20 mg/dL 7.82  9.56  2.13   Sodium 135 - 145 mEq/L 143  137  140   Potassium 3.5 - 5.1 mEq/L 4.2  3.5  3.9    Chloride 96 - 112 mEq/L 109  104  107   CO2 19 - 32 mEq/L 25  23  23    Calcium 8.4 - 10.5 mg/dL 9.4  8.6  9.2       Latest Ref Rng & Units 11/21/2022   10:20 AM 05/29/2022    6:52 AM 05/21/2022   10:25 AM  CMP  Glucose 70 - 99 mg/dL 086  578  469   BUN 6 - 23 mg/dL 20  13  19    Creatinine 0.40 - 1.20 mg/dL 6.29  5.28  4.13   Sodium 135 - 145 mEq/L 143  137  140   Potassium 3.5 - 5.1 mEq/L 4.2  3.5  3.9   Chloride 96 - 112 mEq/L 109  104  107   CO2 19 - 32 mEq/L 25  23  23    Calcium 8.4 - 10.5 mg/dL 9.4  8.6  9.2   Total Protein 6.5 - 8.1 g/dL   7.4   Total Bilirubin 0.3 - 1.2 mg/dL   0.8   Alkaline Phos 38 - 126 U/L   61   AST 15 - 41 U/L   18   ALT 0 - 44 U/L   26     Lab Results  Component Value Date   CHOL 106 11/21/2022   HDL 58.50 11/21/2022   LDLCALC 31 11/21/2022   TRIG 79.0 11/21/2022   CHOLHDL 2 11/21/2022   No results for input(s): "LIPOA" in the last 8760 hours. No components found for: "NTPROBNP" No results for input(s): "PROBNP" in the last 8760 hours. Recent Labs    11/21/22 1020  TSH 0.23*    Physical Exam:    Today's Vitals   03/31/23 0840  BP: 120/68  Pulse: 68  SpO2: 99%  Weight: 250 lb (113.4 kg)  Height: 5\' 8"  (1.727 m)   Body mass index is 38.01 kg/m. Wt Readings from Last 3 Encounters:  03/31/23 250 lb (113.4 kg)  11/21/22 250 lb (113.4 kg)  09/24/22 255 lb (115.7 kg)    Physical Exam  Constitutional: No distress.  Age appropriate, hemodynamically stable, ambulates w/ cane  Neck: No JVD present.  Cardiovascular: Normal rate, regular rhythm, S1 normal, S2 normal, intact distal pulses and normal pulses. Exam reveals no gallop, no S3 and no S4.  No murmur heard. Pulmonary/Chest: Effort normal and breath sounds normal. No stridor. She has no wheezes. She has no rales.  Abdominal: Soft. Bowel sounds are normal. She exhibits no distension. There is no abdominal tenderness.  Musculoskeletal:        General: No edema.     Cervical  back: Neck supple.  Neurological: She is alert and oriented to person, place, and time. She has intact cranial nerves (2-12).  Skin: Skin is warm and moist.     Impression & Recommendation(s):  Impression:   ICD-10-CM  1. Atherosclerosis of native coronary artery of native heart without angina pectoris  I25.10 EKG 12-Lead    2. History of coronary angioplasty with insertion of stent  Z95.5     3. Benign hypertension  I10 ECHOCARDIOGRAM COMPLETE    4. Type 2 diabetes mellitus with other circulatory complication, with long-term current use of insulin (HCC)  E11.59    Z79.4     5. Mixed hyperlipidemia  E78.2     6. Former smoker  Z87.891        Recommendation(s):  Atherosclerosis of native coronary artery of native heart without angina pectoris History of coronary angioplasty with insertion of stent Denies anginal chest pain. EKG is nonischemic. Overall functional capacity remains stable/improving. Antianginal therapies include: Toprol-XL, Imdur, sublingual nitroglycerin tablets on a as needed basis, and amlodipine Continue aspirin 81 mg p.o. daily. Continue Crestor 40 mg p.o. daily Echo will be ordered to evaluate for structural heart disease and left ventricular systolic function -prior to the next office visit.  Benign hypertension Office blood pressures are well-controlled. Continue amlodipine/valsartan 5/160 mg p.o. every afternoon. Continue Imdur 30 mg p.o.daily.  Continue Toprol-XL 50 mg p.o. every morning.  Type 2 diabetes mellitus with other circulatory complication, with long-term current use of insulin (HCC) Most recent hemoglobin A1c 6.2 as of October 2024. Remains on insulin therapy. Continue ARB, statin therapy, and Ozempic.  Mixed hyperlipidemia Currently on Crestor 40 mg p.o. daily.   She denies myalgia or other side effects. Most recent lipids dated 11/21/2022, independently reviewed as noted above.  LDL is 31 mg/dL. Patient plans to cut down on Ozempic  to see if he can continue the same amount of glycemic control and weight loss.  She will discuss this further with her endocrinologist. Cardiology is following peripherally.   Orders Placed:  Orders Placed This Encounter  Procedures   EKG 12-Lead   ECHOCARDIOGRAM COMPLETE    Standing Status:   Future    Expected Date:   09/28/2023    Expiration Date:   03/30/2024    Where should this test be performed:   [facility])    Does the patient weigh less than or greater than 250 lbs?:   Patient weighs greater than 250 lbs             pt is 250 lbs    Perflutren DEFINITY (image enhancing agent) should be administered unless hypersensitivity or allergy exist:   Administer Perflutren    Reason for exam-Echo:   Other-Full Diagnosis List    Full ICD-10/Reason for Exam:   HTN (hypertension) [403474]    Final Medication List:   No orders of the defined types were placed in this encounter.   There are no discontinued medications.   Current Outpatient Medications:    amLODipine-valsartan (EXFORGE) 5-160 MG tablet, Take 1 tablet by mouth at bedtime., Disp: , Rfl:    aspirin 81 MG chewable tablet, Chew 1 tablet (81 mg total) by mouth 2 (two) times daily., Disp: 35 tablet, Rfl: 0   docusate sodium (COLACE) 100 MG capsule, Take 100 mg by mouth daily after breakfast., Disp: , Rfl:    hydrOXYzine (VISTARIL) 25 MG capsule, Take 25-50 mg by mouth at bedtime as needed., Disp: , Rfl:    insulin aspart (NOVOLOG FLEXPEN) 100 UNIT/ML FlexPen, Max Daily 15 units, Disp: 15 mL, Rfl: 3   insulin glargine (LANTUS SOLOSTAR) 100 UNIT/ML Solostar Pen, Inject 8 Units into the skin at bedtime., Disp: 15 mL, Rfl: 3  Insulin Pen Needle (B-D UF III MINI PEN NEEDLES) 31G X 5 MM MISC, 1 Device by Does not apply route in the morning, at noon, in the evening, and at bedtime., Disp: 400 each, Rfl: 3   isosorbide mononitrate (IMDUR) 30 MG 24 hr tablet, Take 30 mg by mouth daily., Disp: , Rfl:     levothyroxine (SYNTHROID) 150 MCG tablet, Take 1 tablet (150 mcg total) by mouth daily before breakfast., Disp: 90 tablet, Rfl: 3   metoprolol succinate (TOPROL-XL) 50 MG 24 hr tablet, Take 50 mg by mouth daily after breakfast., Disp: , Rfl:    MITIGARE 0.6 MG CAPS, Take 0.6 mg by mouth daily after breakfast., Disp: , Rfl:    nitroGLYCERIN (NITROSTAT) 0.4 MG SL tablet, Place 1 tablet (0.4 mg total) under the tongue every 5 (five) minutes as needed for chest pain (up to 3 doses)., Disp: 25 tablet, Rfl: 3   OZEMPIC, 2 MG/DOSE, 8 MG/3ML SOPN, Inject 2 mg into the skin once a week., Disp: 9 mL, Rfl: 3   rosuvastatin (CRESTOR) 40 MG tablet, Take 40 mg by mouth daily after breakfast., Disp: , Rfl:    ondansetron (ZOFRAN-ODT) 8 MG disintegrating tablet, 1 tab(s) orally 2 times a day as needed (Patient not taking: Reported on 03/31/2023), Disp: , Rfl:    traZODone (DESYREL) 50 MG tablet, Take 50 mg by mouth at bedtime as needed for sleep. (Patient not taking: Reported on 03/31/2023), Disp: , Rfl:   Consent:   NA  Disposition:   6 months or sooner if needed.   Patient wanted to avoid annual visits continue our 74-month follow-up-given her cardiovascular history, and risk factors.  Her questions and concerns were addressed to her satisfaction. She voices understanding of the recommendations provided during this encounter.   As part of today's office visit independently reviewed EKG 82/20/2025, labs from [facility] database 11/21/2022, echocardiogram May 2022.  Signed, Tessa Lerner, DO, [facility]  [facility]  [address] [place], [place] [address] 03/31/2023 9:05 AM

## 2023-03-31 NOTE — Patient Instructions (Addendum)
 Medication Instructions:  Your physician recommends that you continue on your current medications as directed. Please refer to the Current Medication list given to you today.  *If you need a refill on your cardiac medications before your next appointment, please call your pharmacy*  Lab Work: None ordered today. If you have labs (blood work) drawn today and your tests are completely normal, you will receive your results only by: MyChart Message (if you have MyChart) OR A paper copy in the mail If you have any lab test that is abnormal or we need to change your treatment, we will call you to review the results.  Testing/Procedures: Your physician has requested that you have an echocardiogram prior to your 6 month follow-up with Dr. Odis Hollingshead in August. Echocardiography is a painless test that uses sound waves to create images of your heart. It provides your doctor with information about the size and shape of your heart and how well your heart's chambers and valves are working. This procedure takes approximately one hour. There are no restrictions for this procedure. Please do NOT wear cologne, perfume, aftershave, or lotions (deodorant is allowed). Please arrive 15 minutes prior to your appointment time.  Please note: We ask at that you not bring children with you during ultrasound (echo/ vascular) testing. Due to room size and safety concerns, children are not allowed in the ultrasound rooms during exams. Our front office staff cannot provide observation of children in our lobby area while testing is being conducted. An adult accompanying a patient to their appointment will only be allowed in the ultrasound room at the discretion of the ultrasound technician under special circumstances. We apologize for any inconvenience.   Follow-Up: At [facility], you and your health needs are our priority.  As part of our continuing mission to provide you with exceptional heart care, we have created designated  Provider Care Teams.  These Care Teams include your primary Cardiologist (physician) and Advanced Practice Providers (APPs -  Physician Assistants and Nurse Practitioners) who all work together to provide you with the care you need, when you need it.  Your next appointment:   6 month(s)  The format for your next appointment:   In Person  Provider:   Tessa Lerner, DO {

## 2023-04-23 DIAGNOSIS — J069 Acute upper respiratory infection, unspecified: Secondary | ICD-10-CM | POA: Diagnosis not present

## 2023-05-13 ENCOUNTER — Telehealth: Payer: Self-pay

## 2023-05-13 NOTE — Progress Notes (Signed)
   05/13/2023  Patient ID: Bearl Mulberry, female   DOB: 05-Sep-1960, 63 y.o.   MRN: [id number]  Contacted patient regarding medication adherence from a quality report for [facility]. The patient failed [facility] in 2024.    Left patient a voicemail to return my call at their convenience  Harlon Flor, PharmD Clinical Pharmacist  [phone]

## 2023-05-27 ENCOUNTER — Ambulatory Visit: Payer: Medicare HMO | Admitting: Internal Medicine

## 2023-05-27 ENCOUNTER — Encounter: Payer: Self-pay | Admitting: Internal Medicine

## 2023-05-27 VITALS — BP 110/74 | HR 67 | Ht 68.0 in | Wt 247.0 lb

## 2023-05-27 DIAGNOSIS — E1142 Type 2 diabetes mellitus with diabetic polyneuropathy: Secondary | ICD-10-CM

## 2023-05-27 DIAGNOSIS — E039 Hypothyroidism, unspecified: Secondary | ICD-10-CM | POA: Diagnosis not present

## 2023-05-27 DIAGNOSIS — E1159 Type 2 diabetes mellitus with other circulatory complications: Secondary | ICD-10-CM | POA: Diagnosis not present

## 2023-05-27 DIAGNOSIS — Z794 Long term (current) use of insulin: Secondary | ICD-10-CM

## 2023-05-27 LAB — T4, FREE: Free T4: 1.1 ng/dL (ref 0.8–1.8)

## 2023-05-27 LAB — POCT GLYCOSYLATED HEMOGLOBIN (HGB A1C): Hemoglobin A1C: 5.9 % — AB (ref 4.0–5.6)

## 2023-05-27 LAB — TSH: TSH: 4.13 m[IU]/L (ref 0.40–4.50)

## 2023-05-27 MED ORDER — LANTUS SOLOSTAR 100 UNIT/ML ~~LOC~~ SOPN: 6.0000 [IU] | PEN_INJECTOR | Freq: Every day | SUBCUTANEOUS | 3 refills | Status: DC

## 2023-05-27 NOTE — Progress Notes (Signed)
 Name: [name]  MRN/ DOB: [id number], [birth year]   Age/ Sex: 63 y.o., female    PCP: Tretha Fu, MD   Reason for Endocrinology Evaluation: Type 2 Diabetes Mellitus     Date of Initial Endocrinology Visit: 03/16/2021    PATIENT IDENTIFIER: [name] is a 63 y.o. female with a past medical history of T2DM, HTN, dyslipidemia and CAD (status post PCI). The patient presented for initial endocrinology clinic visit on 03/16/2021 for consultative assistance with her diabetes management.    HPI: [name] was    Diagnosed with DM 2013 Prior Medications tried/Intolerance: Metformin- diarrhea  as well as hand and feet swelling. Ozempic  - no  intolerance           Hemoglobin A1c has ranged from 7.5% in 2022, peaking at 11.5% in 2013.  No prior hx of pancreatitis     On her initial visit to our clinic her A1c was 7.5%, we adjusted MDI regimen and started Jardiance .  Trulicity  was continued    THYROID  HISTORY: She has been diagnosed with hypothyroid years ago ~ 2013. No prior neck sx or radiation.   Denies FH of thyroid  disease   SUBJECTIVE:   During the last visit (11/21/2022): A1c 6.2 %   Today (05/27/23): [name] is here for follow-up on diabetes management.she checks her blood sugars 1-2 times daily.She has been noted with hypoglycemia, not symptomatic    She had a follow-up with cardiology 03/2023 for CAD and dyslipidemia  Denies nausea, vomiting except last week when she had gastritis  Denies local neck swelling  Denies palpitations      HOME ENDOCRINE REGIMEN: Lantus  8 units once daily  NovoLog  5 units TID QAC Ozempic  2 mg weekly Levothyroxine  150 mcg,half a tab on Sundays and 1 rest of the week- has been taking 1 tab daily     Statin: Yes ACE-I/ARB: No Prior Diabetic Education: yes   CONTINUOUS GLUCOSE MONITORING RECORD INTERPRETATION    Dates of Recording: 4/9-4/22/2025  Sensor description: dexcom  Results statistics:    CGM use % of time 89  Average and SD 117/22  Time in range   98     %  % Time Above 180 1  % Time above 250 0  % Time Below target 1   Glycemic patterns summary: BGs are optimal throughout the day and night  Hyperglycemic episodes N/A  Hypoglycemic episodes occurred overnight  Overnight periods: Trends down    DIABETIC COMPLICATIONS: Microvascular complications:  Neuropathy  Denies: retinopathy, CKD  Last eye exam: Completed 01/10/2022  Macrovascular complications:  CAD(status post PCI)  Denies: PVD, CVA   PAST HISTORY: Past Medical History:  Past Medical History:  Diagnosis Date   ALLERGIC RHINITIS    Allergy to IVP dye    Arthritis    Coronary artery disease    Diabetes mellitus, type 2 (HCC)    History of tobacco abuse    Quit 2011   Hypertension    Hypertriglyceridemia    Hypothyroidism    NSTEMI (non-ST elevated myocardial infarction) (HCC) 09/30/2011   NO CAD by cath, ? coronary vasospasm   Obesity    Sleep apnea    Past Surgical History:  Past Surgical History:  Procedure Laterality Date   BREAST BIOPSY Left 03/05/2022   US  LT BREAST BX W LOC DEV 1ST LESION IMG BX SPEC US  GUIDE 03/05/2022 [facility]   BREAST BIOPSY  04/25/2022   MM LT RADIOACTIVE SEED LOC MAMMO GUIDE 04/25/2022  [facility]   BREAST LUMPECTOMY WITH RADIOACTIVE SEED LOCALIZATION Left 04/26/2022   Procedure: LEFT BREAST LUMPECTOMY WITH RADIOACTIVE SEED LOCALIZATION;  Surgeon: Caralyn Chandler, MD;  Location: [facility];  Service: General;  Laterality: Left;   CORONARY STENT INTERVENTION N/A 08/19/2017   Procedure: CORONARY STENT INTERVENTION;  Surgeon: Knox Perl, MD;  Location: [facility];  Service: Cardiovascular;  Laterality: N/A;   KNEE ARTHROSCOPY W/ MENISCAL REPAIR Left 2009   KNEE ARTHROSCOPY W/ MENISCAL REPAIR Right 2014   LEFT HEART CATH AND CORONARY ANGIOGRAPHY N/A 08/19/2017   Procedure: LEFT HEART CATH AND CORONARY ANGIOGRAPHY;  Surgeon:  Knox Perl, MD;  Location: [facility];  Service: Cardiovascular;  Laterality: N/A;   LEFT HEART CATHETERIZATION WITH CORONARY ANGIOGRAM N/A 09/30/2011   Procedure: LEFT HEART CATHETERIZATION WITH CORONARY ANGIOGRAM;  Surgeon: Odie Benne, MD;  Location: [facility] CATH [facility];  Service: Cardiovascular;  Laterality: N/A;   LUMBAR DISC SURGERY  11/27/2020   L4-5   TOTAL HIP ARTHROPLASTY Right 05/28/2022   Procedure: RIGHT TOTAL HIP ARTHROPLASTY ANTERIOR APPROACH;  Surgeon: Arnie Lao, MD;  Location: [facility];  Service: Orthopedics;  Laterality: Right;   TUBAL LIGATION     VESICOVAGINAL FISTULA CLOSURE W/ TAH      Social History:  reports that she quit smoking about 12 years ago. Her smoking use included cigarettes. She started smoking about 48 years ago. She has a 28.8 pack-year smoking history. She has never used smokeless tobacco. She reports that she does not currently use alcohol. She reports that she does not use drugs. Family History:  Family History  Problem Relation Age of Onset   Heart disease Mother        has pacemaker   Diabetes Mother    Hypertension Brother    Diabetes Brother    Hypertension Brother    Cancer Maternal Uncle    Diabetes Other        strong family history     HOME MEDICATIONS: Allergies as of 05/27/2023       Reactions   Contrast Media [iodinated Contrast Media] Swelling   Skin Peeling and break out   Codeine Itching   Other    NO PLASTIC TAPE - Blisters and skin removal        Medication List        Accurate as of May 27, 2023  8:08 AM. If you have any questions, ask your nurse or doctor.          STOP taking these medications    traZODone  50 MG tablet Commonly known as: DESYREL  Stopped by: Antonie Borjon J Rennie Hack       TAKE these medications    amLODipine -valsartan  5-160 MG tablet Commonly known as: EXFORGE  Take 1 tablet by mouth at bedtime.   aspirin  81 MG chewable tablet Chew 1 tablet (81 mg total) by  mouth 2 (two) times daily.   B-D UF III MINI PEN NEEDLES 31G X 5 MM Misc Generic drug: Insulin  Pen Needle 1 Device by Does not apply route in the morning, at noon, in the evening, and at bedtime.   Dexcom G7 Sensor Misc as directed.   docusate sodium  100 MG capsule Commonly known as: COLACE Take 100 mg by mouth daily after breakfast.   hydrOXYzine 25 MG capsule Commonly known as: VISTARIL Take 25-50 mg by mouth at bedtime as needed.   isosorbide  mononitrate 30 MG 24 hr tablet Commonly known as: IMDUR  Take 30 mg by  mouth daily.   Lantus  SoloStar 100 UNIT/ML Solostar Pen Generic drug: insulin  glargine Inject 8 Units into the skin at bedtime.   levothyroxine  150 MCG tablet Commonly known as: SYNTHROID  Take 1 tablet (150 mcg total) by mouth daily before breakfast.   metoprolol  succinate 50 MG 24 hr tablet Commonly known as: TOPROL -XL Take 50 mg by mouth daily after breakfast.   Mitigare  0.6 MG Caps Generic drug: Colchicine  Take 0.6 mg by mouth daily after breakfast.   colchicine  0.6 MG tablet 1 tab(s) orally once a day   nitroGLYCERIN  0.4 MG SL tablet Commonly known as: NITROSTAT  Place 1 tablet (0.4 mg total) under the tongue every 5 (five) minutes as needed for chest pain (up to 3 doses).   NovoLOG  FlexPen 100 UNIT/ML FlexPen Generic drug: insulin  aspart Max Daily 15 units   Ozempic  (2 MG/DOSE) 8 MG/3ML Sopn Generic drug: Semaglutide  (2 MG/DOSE) Inject 2 mg into the skin once a week.   rosuvastatin  40 MG tablet Commonly known as: CRESTOR  Take 40 mg by mouth daily after breakfast.         ALLERGIES: Allergies  Allergen Reactions   Contrast Media [Iodinated Contrast Media] Swelling    Skin Peeling and break out   Codeine Itching   Other     NO PLASTIC TAPE - Blisters and skin removal     REVIEW OF SYSTEMS: A comprehensive ROS was conducted with the patient and is negative except as per HPI and    OBJECTIVE:   VITAL SIGNS: BP 110/74 (BP Location:  Left Arm, Patient Position: Sitting, Cuff Size: Normal)   Pulse 67   Ht 5\' 8"  (1.727 m)   Wt 247 lb (112 kg)   SpO2 99%   BMI 37.56 kg/m     Filed Weights   05/27/23 0802  Weight: 247 lb (112 kg)     PHYSICAL EXAM:  General: Pt appears well and is in NAD  Neck: General: Supple without adenopathy or carotid bruits. Thyroid : Thyroid  size normal.  No goiter or nodules appreciated.   Lungs: Clear with good BS bilat   Heart: RRR   Extremities:  Lower extremities - No pretibial edema.  Neuro: MS is good with appropriate affect, pt is alert and Ox3   DM Foot Exam 11/21/2022   The skin of the feet is intact without sores or ulcerations. The pedal pulses are 2+ on right and 2+ on left. The sensation is intact to a screening 5.07, 10 gram monofilament bilaterally   DATA REVIEWED:  Lab Results  Component Value Date   HGBA1C 6.2 (A) 11/21/2022   HGBA1C 6.3 (H) 05/21/2022   HGBA1C 6.7 (A) 11/19/2021     Latest Reference Range & Units 05/27/23 08:40  TSH 0.40 - 4.50 mIU/L 4.13  T4,Free(Direct) 0.8 - 1.8 ng/dL 1.1      Latest Reference Range & Units 11/21/22 10:20  Sodium 135 - 145 mEq/L 143  Potassium 3.5 - 5.1 mEq/L 4.2  Chloride 96 - 112 mEq/L 109  CO2 19 - 32 mEq/L 25  Glucose 70 - 99 mg/dL 161 (H)  BUN 6 - 23 mg/dL 20  Creatinine 0.96 - 0.45 mg/dL 4.09  Calcium  8.4 - 10.5 mg/dL 9.4  GFR >81.19 mL/min 64.23  Total CHOL/HDL Ratio  2  Cholesterol 0 - 200 mg/dL 147  HDL Cholesterol >82.95 mg/dL 62.13  LDL (calc) 0 - 99 mg/dL 31  MICROALB/CREAT RATIO 0.0 - 30.0 mg/g 0.6  NonHDL  47.13  Triglycerides 0.0 - 149.0 mg/dL  79.0  VLDL 0.0 - 40.0 mg/dL 16.1  TSH 0.96 - 0.45 uIU/mL 0.23 (L)    Latest Reference Range & Units 11/21/22 10:20  Creatinine,U mg/dL 409.8  Microalb, Ur 0.0 - 1.9 mg/dL 1.0  MICROALB/CREAT RATIO 0.0 - 30.0 mg/g 0.6    ASSESSMENT / PLAN / RECOMMENDATIONS:   1) Type 2 Diabetes Mellitus, Optimally controlled, With Neuropathic and  macrovascular  complications - Most recent A1c of 5.9%. Goal A1c <7.0%.    - Praised the pt on optimizing glucose control  - Intolerant to Jardiance  due to recurrent genital infections  - Dexcom cost prohibitive  - Due to Hypoglycemia, will decrease Lantus  as below  -No change to Novolog  dose as this time    MEDICATIONS: Decrease  Lantus  8 units daily Continue  NovoLog  to 2-6 units 3 times daily with each meal Continue  Ozempic  2 mg mg weekly Continue correction factor: NovoLog  (BG -130/35)   EDUCATION / INSTRUCTIONS: BG monitoring instructions: Patient is instructed to check her blood sugars 3 times a day Call [facility] if: BG persistently < 70  I reviewed the Rule of 15 for the treatment of hypoglycemia in detail with the patient. Literature supplied.   2) Diabetic complications:  Eye: Does not have known diabetic retinopathy.  Neuro/ Feet: Does  have known diabetic peripheral neuropathy. Renal: Patient does not have known baseline CKD, but her GFR fluctuates. She is not on an ACEI/ARB at present.  3) Hypothyroidism:  -Patient is clinically thyroid  - I had decreased her levothyroxine  due to low TSH, but she is back up to taking 1 tablet daily, repeat TFTs remain normal, no change    Medication Continue levothyroxine  150 mcg, 1 tablet daily   F/U in 6 months     Signed electronically by: Natale Bail, MD  [facility] Endocrinology  [facility] [address] [place] [place], [place] [address] Phone: [phone] FAX: [phone]   CC: Tretha Fu, MD [address] [place] [address] Phone: [phone]  Fax: [phone]    Return to Endocrinology clinic as below: Future Appointments  Date Time Provider Department Center  09/15/2023  9:30 AM [id number]

## 2023-05-27 NOTE — Patient Instructions (Addendum)
-   Decrease  Lantus  6 daily - Continue Ozempic  2 mg weekly  -Novolog  correctional insulin : ADD extra units on insulin  to your meal-time Novolog  dose if your blood sugars are higher than 165. Use the scale below to help guide you:   Blood sugar before meal Number of units to inject  Less than 165 0 unit  166 -  200 1 units  201 -  235 2 units  236 -  270 3 units  271 -  305 4 units  306 -  340 5 units  341 -  375 6 units  376 -  410 7 units      HOW TO TREAT LOW BLOOD SUGARS (Blood sugar LESS THAN 70 MG/DL) Please follow the RULE OF 15 for the treatment of hypoglycemia treatment (when your (blood sugars are less than 70 mg/dL)   STEP 1: Take 15 grams of carbohydrates when your blood sugar is low, which includes:  3-4 GLUCOSE TABS  OR 3-4 OZ OF JUICE OR REGULAR SODA OR ONE TUBE OF GLUCOSE GEL    STEP 2: RECHECK blood sugar in 15 MINUTES STEP 3: If your blood sugar is still low at the 15 minute recheck --> then, go back to STEP 1 and treat AGAIN with another 15 grams of carbohydrates.

## 2023-05-28 ENCOUNTER — Encounter: Payer: Self-pay | Admitting: Internal Medicine

## 2023-05-28 DIAGNOSIS — E1165 Type 2 diabetes mellitus with hyperglycemia: Secondary | ICD-10-CM | POA: Diagnosis not present

## 2023-05-28 DIAGNOSIS — E782 Mixed hyperlipidemia: Secondary | ICD-10-CM | POA: Diagnosis not present

## 2023-05-28 DIAGNOSIS — I2510 Atherosclerotic heart disease of native coronary artery without angina pectoris: Secondary | ICD-10-CM | POA: Diagnosis not present

## 2023-05-28 DIAGNOSIS — Z794 Long term (current) use of insulin: Secondary | ICD-10-CM | POA: Diagnosis not present

## 2023-05-28 DIAGNOSIS — G4700 Insomnia, unspecified: Secondary | ICD-10-CM | POA: Diagnosis not present

## 2023-05-28 DIAGNOSIS — I10 Essential (primary) hypertension: Secondary | ICD-10-CM | POA: Diagnosis not present

## 2023-05-28 DIAGNOSIS — E039 Hypothyroidism, unspecified: Secondary | ICD-10-CM | POA: Diagnosis not present

## 2023-05-28 DIAGNOSIS — Z720 Tobacco use: Secondary | ICD-10-CM | POA: Diagnosis not present

## 2023-05-28 MED ORDER — LEVOTHYROXINE SODIUM 150 MCG PO TABS: 150.0000 ug | ORAL_TABLET | Freq: Every day | ORAL | 3 refills | Status: AC

## 2023-07-07 DIAGNOSIS — N898 Other specified noninflammatory disorders of vagina: Secondary | ICD-10-CM | POA: Diagnosis not present

## 2023-07-28 ENCOUNTER — Ambulatory Visit: Admitting: Orthopaedic Surgery

## 2023-07-28 ENCOUNTER — Encounter: Payer: Self-pay | Admitting: Orthopaedic Surgery

## 2023-07-28 DIAGNOSIS — Z96641 Presence of right artificial hip joint: Secondary | ICD-10-CM

## 2023-07-28 NOTE — Progress Notes (Signed)
 The patient is now over a year out from right total hip arthroplasty.  She mainly came in today just to show us  how she is doing.  She is also 100 pounds and her blood glucose is under excellent control.  She denies any right hip pain at all and says her left hip barely hurts.  On exam both hips move smoothly and fluidly.  She walks with a normal gait.  Her leg lengths are also equal.  She is just doing fabulous overall and that the testament to her taking care of her health.  At this point follow-up can be as needed.  If she does have any issues she will let us  know.  No x-rays were needed today.

## 2023-08-27 DIAGNOSIS — I10 Essential (primary) hypertension: Secondary | ICD-10-CM | POA: Diagnosis not present

## 2023-08-27 DIAGNOSIS — E782 Mixed hyperlipidemia: Secondary | ICD-10-CM | POA: Diagnosis not present

## 2023-08-27 DIAGNOSIS — M1A09X0 Idiopathic chronic gout, multiple sites, without tophus (tophi): Secondary | ICD-10-CM | POA: Diagnosis not present

## 2023-08-27 DIAGNOSIS — I2510 Atherosclerotic heart disease of native coronary artery without angina pectoris: Secondary | ICD-10-CM | POA: Diagnosis not present

## 2023-08-27 DIAGNOSIS — Z794 Long term (current) use of insulin: Secondary | ICD-10-CM | POA: Diagnosis not present

## 2023-08-27 DIAGNOSIS — G4700 Insomnia, unspecified: Secondary | ICD-10-CM | POA: Diagnosis not present

## 2023-08-27 DIAGNOSIS — Z720 Tobacco use: Secondary | ICD-10-CM | POA: Diagnosis not present

## 2023-08-27 DIAGNOSIS — E039 Hypothyroidism, unspecified: Secondary | ICD-10-CM | POA: Diagnosis not present

## 2023-08-27 DIAGNOSIS — Z124 Encounter for screening for malignant neoplasm of cervix: Secondary | ICD-10-CM | POA: Diagnosis not present

## 2023-08-27 DIAGNOSIS — E1165 Type 2 diabetes mellitus with hyperglycemia: Secondary | ICD-10-CM | POA: Diagnosis not present

## 2023-09-15 ENCOUNTER — Ambulatory Visit (HOSPITAL_COMMUNITY)
Admission: RE | Admit: 2023-09-15 | Discharge: 2023-09-15 | Disposition: A | Discharge status: Home / Self Care | Payer: Medicare HMO | Source: Ambulatory Visit | Attending: Cardiology | Admitting: Cardiology

## 2023-09-15 DIAGNOSIS — I10 Essential (primary) hypertension: Secondary | ICD-10-CM | POA: Insufficient documentation

## 2023-09-15 LAB — ECHOCARDIOGRAM COMPLETE
AR max vel: 2.77 cm2
AV Area VTI: 2.64 cm2
AV Area mean vel: 2.71 cm2
AV Mean grad: 3.5 mmHg
AV Peak grad: 6.7 mmHg
Ao pk vel: 1.29 m/s
Area-P 1/2: 4.22 cm2
S' Lateral: 2.1 cm

## 2023-09-18 ENCOUNTER — Ambulatory Visit: Payer: Self-pay | Admitting: Cardiology

## 2023-10-14 DIAGNOSIS — H524 Presbyopia: Secondary | ICD-10-CM | POA: Diagnosis not present

## 2023-10-14 DIAGNOSIS — H5213 Myopia, bilateral: Secondary | ICD-10-CM | POA: Diagnosis not present

## 2023-10-14 DIAGNOSIS — H52209 Unspecified astigmatism, unspecified eye: Secondary | ICD-10-CM | POA: Diagnosis not present

## 2023-11-24 ENCOUNTER — Ambulatory Visit: Admitting: Internal Medicine

## 2023-11-24 NOTE — Progress Notes (Deleted)
 Name: [name]  MRN/ DOB: [id number], [birth year]   Age/ Sex: 63 y.o., female    PCP: Catalina Bare, MD   Reason for Endocrinology Evaluation: Type 2 Diabetes Mellitus     Date of Initial Endocrinology Visit: 03/16/2021    PATIENT IDENTIFIER: [name] is a 63 y.o. female with a past medical history of T2DM, HTN, dyslipidemia and CAD (status post PCI). The patient presented for initial endocrinology clinic visit on 03/16/2021 for consultative assistance with her diabetes management.    HPI: [name] was    Diagnosed with DM 2013 Prior Medications tried/Intolerance: Metformin- diarrhea  as well as hand and feet swelling. Ozempic  - no  intolerance           Hemoglobin A1c has ranged from 7.5% in 2022, peaking at 11.5% in 2013.  No prior hx of pancreatitis     On her initial visit to our clinic her A1c was 7.5%, we adjusted MDI regimen and started Jardiance .  Trulicity  was continued    THYROID  HISTORY: She has been diagnosed with hypothyroid years ago ~ 2013. No prior neck sx or radiation.   Denies FH of thyroid  disease   SUBJECTIVE:   During the last visit (05/27/2023): A1c 5.9%   Today (11/24/23): [name] is here for follow-up on diabetes management.she checks her blood sugars 1-2 times daily.She has been noted with hypoglycemia, not symptomatic    She had a follow-up with cardiology 03/2023 for CAD and dyslipidemia  Denies nausea, vomiting except last week when she had gastritis  Denies local neck swelling  Denies palpitations      HOME ENDOCRINE REGIMEN: Lantus  8 units once daily  NovoLog  2-6 units TID QAC Ozempic  2 mg weekly Levothyroxine  150 mcg,daily     Statin: Yes ACE-I/ARB: No Prior Diabetic Education: yes   CONTINUOUS GLUCOSE MONITORING RECORD INTERPRETATION    Dates of Recording: 4/9-4/22/2025  Sensor description: dexcom  Results statistics:   CGM use % of time 89  Average and SD 117/22  Time in range   98      %  % Time Above 180 1  % Time above 250 0  % Time Below target 1   Glycemic patterns summary: BGs are optimal throughout the day and night  Hyperglycemic episodes N/A  Hypoglycemic episodes occurred overnight  Overnight periods: Trends down    DIABETIC COMPLICATIONS: Microvascular complications:  Neuropathy  Denies: retinopathy, CKD  Last eye exam: Completed 01/10/2022  Macrovascular complications:  CAD(status post PCI)  Denies: PVD, CVA   PAST HISTORY: Past Medical History:  Past Medical History:  Diagnosis Date   ALLERGIC RHINITIS    Allergy to IVP dye    Arthritis    Coronary artery disease    Diabetes mellitus, type 2 (HCC)    History of tobacco abuse    Quit 2011   Hypertension    Hypertriglyceridemia    Hypothyroidism    NSTEMI (non-ST elevated myocardial infarction) (HCC) 09/30/2011   NO CAD by cath, ? coronary vasospasm   Obesity    Sleep apnea    Past Surgical History:  Past Surgical History:  Procedure Laterality Date   BREAST BIOPSY Left 03/05/2022   US  LT BREAST BX W LOC DEV 1ST LESION IMG BX SPEC US  GUIDE 03/05/2022 [facility]   BREAST BIOPSY  04/25/2022   MM LT RADIOACTIVE SEED LOC MAMMO GUIDE 04/25/2022 [facility]   BREAST LUMPECTOMY WITH RADIOACTIVE SEED LOCALIZATION Left 04/26/2022   Procedure: LEFT BREAST  LUMPECTOMY WITH RADIOACTIVE SEED LOCALIZATION;  Surgeon: Curvin Deward MOULD, MD;  Location: [facility];  Service: General;  Laterality: Left;   CORONARY STENT INTERVENTION N/A 08/19/2017   Procedure: CORONARY STENT INTERVENTION;  Surgeon: Ladona Heinz, MD;  Location: [facility];  Service: Cardiovascular;  Laterality: N/A;   KNEE ARTHROSCOPY W/ MENISCAL REPAIR Left 2009   KNEE ARTHROSCOPY W/ MENISCAL REPAIR Right 2014   LEFT HEART CATH AND CORONARY ANGIOGRAPHY N/A 08/19/2017   Procedure: LEFT HEART CATH AND CORONARY ANGIOGRAPHY;  Surgeon: Ladona Heinz, MD;  Location: [facility];  Service: Cardiovascular;   Laterality: N/A;   LEFT HEART CATHETERIZATION WITH CORONARY ANGIOGRAM N/A 09/30/2011   Procedure: LEFT HEART CATHETERIZATION WITH CORONARY ANGIOGRAM;  Surgeon: Lonni JONETTA Cash, MD;  Location: [facility];  Service: Cardiovascular;  Laterality: N/A;   LUMBAR DISC SURGERY  11/27/2020   L4-5   TOTAL HIP ARTHROPLASTY Right 05/28/2022   Procedure: RIGHT TOTAL HIP ARTHROPLASTY ANTERIOR APPROACH;  Surgeon: Vernetta Lonni GRADE, MD;  Location: [facility];  Service: Orthopedics;  Laterality: Right;   TUBAL LIGATION     VESICOVAGINAL FISTULA CLOSURE W/ TAH      Social History:  reports that she quit smoking about 12 years ago. Her smoking use included cigarettes. She started smoking about 48 years ago. She has a 28.8 pack-year smoking history. She has never used smokeless tobacco. She reports that she does not currently use alcohol. She reports that she does not use drugs. Family History:  Family History  Problem Relation Age of Onset   Heart disease Mother        has pacemaker   Diabetes Mother    Hypertension Brother    Diabetes Brother    Hypertension Brother    Cancer Maternal Uncle    Diabetes Other        strong family history     HOME MEDICATIONS: Allergies as of 11/24/2023       Reactions   Contrast Media [iodinated Contrast Media] Swelling   Skin Peeling and break out   Codeine Itching   Other    NO PLASTIC TAPE - Blisters and skin removal        Medication List        Accurate as of November 24, 2023  7:29 AM. If you have any questions, ask your nurse or doctor.          amLODipine -valsartan  5-160 MG tablet Commonly known as: EXFORGE  Take 1 tablet by mouth at bedtime.   aspirin  81 MG chewable tablet Chew 1 tablet (81 mg total) by mouth 2 (two) times daily.   B-D UF III MINI PEN NEEDLES 31G X 5 MM Misc Generic drug: Insulin  Pen Needle 1 Device by Does not apply route in the morning, at noon, in the evening, and at bedtime.   Dexcom G7 Sensor Misc as  directed.   docusate sodium  100 MG capsule Commonly known as: COLACE Take 100 mg by mouth daily after breakfast.   hydrOXYzine 25 MG capsule Commonly known as: VISTARIL Take 25-50 mg by mouth at bedtime as needed.   isosorbide  mononitrate 30 MG 24 hr tablet Commonly known as: IMDUR  Take 30 mg by mouth daily.   Lantus  SoloStar 100 UNIT/ML Solostar Pen Generic drug: insulin  glargine Inject 6 Units into the skin at bedtime.   levothyroxine  150 MCG tablet Commonly known as: SYNTHROID  Take 1 tablet (150 mcg total) by mouth daily before breakfast.   metoprolol  succinate 50 MG  24 hr tablet Commonly known as: TOPROL -XL Take 50 mg by mouth daily after breakfast.   Mitigare  0.6 MG Caps Generic drug: Colchicine  Take 0.6 mg by mouth daily after breakfast.   colchicine  0.6 MG tablet 1 tab(s) orally once a day   nitroGLYCERIN  0.4 MG SL tablet Commonly known as: NITROSTAT  Place 1 tablet (0.4 mg total) under the tongue every 5 (five) minutes as needed for chest pain (up to 3 doses).   NovoLOG  FlexPen 100 UNIT/ML FlexPen Generic drug: insulin  aspart Max Daily 15 units   Ozempic  (2 MG/DOSE) 8 MG/3ML Sopn Generic drug: Semaglutide  (2 MG/DOSE) Inject 2 mg into the skin once a week.   rosuvastatin  40 MG tablet Commonly known as: CRESTOR  Take 40 mg by mouth daily after breakfast.         ALLERGIES: Allergies  Allergen Reactions   Contrast Media [Iodinated Contrast Media] Swelling    Skin Peeling and break out   Codeine Itching   Other     NO PLASTIC TAPE - Blisters and skin removal     REVIEW OF SYSTEMS: A comprehensive ROS was conducted with the patient and is negative except as per HPI and    OBJECTIVE:   VITAL SIGNS: There were no vitals taken for this visit.    There were no vitals filed for this visit.    PHYSICAL EXAM:  General: [name] appears well and is in NAD  Neck: General: Supple without adenopathy or carotid bruits. Thyroid : Thyroid  size normal.  No  goiter or nodules appreciated.   Lungs: Clear with good BS bilat   Heart: RRR   Extremities:  Lower extremities - No pretibial edema.  Neuro: MS is good with appropriate affect, [name] is alert and Ox3   DM Foot Exam 11/21/2022   The skin of the feet is intact without sores or ulcerations. The pedal pulses are 2+ on right and 2+ on left. The sensation is intact to a screening 5.07, 10 gram monofilament bilaterally   DATA REVIEWED:  Lab Results  Component Value Date   HGBA1C 5.9 (A) 05/27/2023   HGBA1C 6.2 (A) 11/21/2022   HGBA1C 6.3 (H) 05/21/2022     Latest Reference Range & Units 05/27/23 08:40  TSH 0.40 - 4.50 mIU/L 4.13  T4,Free(Direct) 0.8 - 1.8 ng/dL 1.1      Latest Reference Range & Units 11/21/22 10:20  Sodium 135 - 145 mEq/L 143  Potassium 3.5 - 5.1 mEq/L 4.2  Chloride 96 - 112 mEq/L 109  CO2 19 - 32 mEq/L 25  Glucose 70 - 99 mg/dL 893 (H)  BUN 6 - 23 mg/dL 20  Creatinine 9.59 - 8.79 mg/dL 9.04  Calcium  8.4 - 10.5 mg/dL 9.4  GFR >39.99 mL/min 64.23  Total CHOL/HDL Ratio  2  Cholesterol 0 - 200 mg/dL 893  HDL Cholesterol >60.99 mg/dL 41.49  LDL (calc) 0 - 99 mg/dL 31  MICROALB/CREAT RATIO 0.0 - 30.0 mg/g 0.6  NonHDL  47.13  Triglycerides 0.0 - 149.0 mg/dL 20.9  VLDL 0.0 - 59.9 mg/dL 84.1  TSH 9.64 - 4.49 uIU/mL 0.23 (L)    Latest Reference Range & Units 11/21/22 10:20  Creatinine,U mg/dL 852.0  Microalb, Ur 0.0 - 1.9 mg/dL 1.0  MICROALB/CREAT RATIO 0.0 - 30.0 mg/g 0.6    ASSESSMENT / PLAN / RECOMMENDATIONS:   1) Type 2 Diabetes Mellitus, Optimally controlled, With Neuropathic and  macrovascular complications - Most recent A1c of 5.9%. Goal A1c <7.0%.    - Praised the [name] on  optimizing glucose control  - Intolerant to Jardiance  due to recurrent genital infections  - Dexcom cost prohibitive  - Due to Hypoglycemia, will decrease Lantus  as below  -No change to Novolog  dose as this time    MEDICATIONS: Decrease  Lantus  8 units daily Continue  NovoLog   to 2-6 units 3 times daily with each meal Continue  Ozempic  2 mg mg weekly Continue correction factor: NovoLog  (BG -130/35)   EDUCATION / INSTRUCTIONS: BG monitoring instructions: Patient is instructed to check her blood sugars 3 times a day Call [facility] if: BG persistently < 70  I reviewed the Rule of 15 for the treatment of hypoglycemia in detail with the patient. Literature supplied.   2) Diabetic complications:  Eye: Does not have known diabetic retinopathy.  Neuro/ Feet: Does  have known diabetic peripheral neuropathy. Renal: Patient does not have known baseline CKD, but her GFR fluctuates. She is not on an ACEI/ARB at present.  3) Hypothyroidism:  -Patient is clinically thyroid  - I had decreased her levothyroxine  due to low TSH, but she is back up to taking 1 tablet daily, repeat TFTs remain normal, no change    Medication Continue levothyroxine  150 mcg, 1 tablet daily   F/U in 6 months     Signed electronically by: Stefano Redgie Butts, MD  [facility]  [facility] [address] [place] [place], [place] [address] Phone: [phone] FAX: [phone]   CC: Catalina Bare, MD [address] [place] [address] Phone: [phone]  Fax: [phone]    Return to Endocrinology clinic as below: Future Appointments  Date Time Provider Department Center  11/24/2023 12:10 PM Terryl Niziolek, Donell Redgie, MD [id number] None

## 2023-12-08 ENCOUNTER — Encounter: Payer: Self-pay | Admitting: Radiology

## 2023-12-16 ENCOUNTER — Other Ambulatory Visit: Payer: Self-pay

## 2023-12-16 MED ORDER — OZEMPIC (2 MG/DOSE) 8 MG/3ML ~~LOC~~ SOPN: 2.0000 mg | PEN_INJECTOR | SUBCUTANEOUS | 3 refills | Status: AC

## 2024-01-22 ENCOUNTER — Other Ambulatory Visit: Payer: Self-pay | Admitting: Physician Assistant

## 2024-01-22 DIAGNOSIS — I10 Essential (primary) hypertension: Secondary | ICD-10-CM | POA: Diagnosis not present

## 2024-01-22 DIAGNOSIS — I2510 Atherosclerotic heart disease of native coronary artery without angina pectoris: Secondary | ICD-10-CM | POA: Diagnosis not present

## 2024-01-22 DIAGNOSIS — E782 Mixed hyperlipidemia: Secondary | ICD-10-CM | POA: Diagnosis not present

## 2024-01-22 DIAGNOSIS — Z794 Long term (current) use of insulin: Secondary | ICD-10-CM | POA: Diagnosis not present

## 2024-01-22 DIAGNOSIS — Z1231 Encounter for screening mammogram for malignant neoplasm of breast: Secondary | ICD-10-CM

## 2024-01-22 DIAGNOSIS — M1A09X0 Idiopathic chronic gout, multiple sites, without tophus (tophi): Secondary | ICD-10-CM | POA: Diagnosis not present

## 2024-01-22 DIAGNOSIS — N393 Stress incontinence (female) (male): Secondary | ICD-10-CM | POA: Diagnosis not present

## 2024-01-22 DIAGNOSIS — G4700 Insomnia, unspecified: Secondary | ICD-10-CM | POA: Diagnosis not present

## 2024-01-22 DIAGNOSIS — E1165 Type 2 diabetes mellitus with hyperglycemia: Secondary | ICD-10-CM | POA: Diagnosis not present

## 2024-01-22 DIAGNOSIS — R32 Unspecified urinary incontinence: Secondary | ICD-10-CM | POA: Diagnosis not present

## 2024-01-22 DIAGNOSIS — Z720 Tobacco use: Secondary | ICD-10-CM | POA: Diagnosis not present

## 2024-01-22 DIAGNOSIS — E039 Hypothyroidism, unspecified: Secondary | ICD-10-CM | POA: Diagnosis not present

## 2024-01-23 DIAGNOSIS — Z794 Long term (current) use of insulin: Secondary | ICD-10-CM | POA: Diagnosis not present

## 2024-01-23 DIAGNOSIS — E1165 Type 2 diabetes mellitus with hyperglycemia: Secondary | ICD-10-CM | POA: Diagnosis not present

## 2024-01-23 DIAGNOSIS — I10 Essential (primary) hypertension: Secondary | ICD-10-CM | POA: Diagnosis not present

## 2024-01-23 DIAGNOSIS — Z720 Tobacco use: Secondary | ICD-10-CM | POA: Diagnosis not present

## 2024-01-23 DIAGNOSIS — G4700 Insomnia, unspecified: Secondary | ICD-10-CM | POA: Diagnosis not present

## 2024-01-23 DIAGNOSIS — N393 Stress incontinence (female) (male): Secondary | ICD-10-CM | POA: Diagnosis not present

## 2024-01-23 DIAGNOSIS — M1A09X0 Idiopathic chronic gout, multiple sites, without tophus (tophi): Secondary | ICD-10-CM | POA: Diagnosis not present

## 2024-01-23 DIAGNOSIS — E039 Hypothyroidism, unspecified: Secondary | ICD-10-CM | POA: Diagnosis not present

## 2024-01-23 DIAGNOSIS — E782 Mixed hyperlipidemia: Secondary | ICD-10-CM | POA: Diagnosis not present

## 2024-01-23 DIAGNOSIS — I2510 Atherosclerotic heart disease of native coronary artery without angina pectoris: Secondary | ICD-10-CM | POA: Diagnosis not present

## 2024-01-23 DIAGNOSIS — R32 Unspecified urinary incontinence: Secondary | ICD-10-CM | POA: Diagnosis not present

## 2024-02-11 ENCOUNTER — Ambulatory Visit
Admission: RE | Admit: 2024-02-11 | Discharge: 2024-02-11 | Disposition: A | Discharge status: Home / Self Care | Source: Ambulatory Visit | Attending: Physician Assistant | Admitting: Physician Assistant

## 2024-02-11 DIAGNOSIS — Z1231 Encounter for screening mammogram for malignant neoplasm of breast: Secondary | ICD-10-CM

## 2024-02-25 ENCOUNTER — Other Ambulatory Visit: Payer: Self-pay | Admitting: Internal Medicine

## 2024-03-02 ENCOUNTER — Other Ambulatory Visit: Payer: Self-pay

## 2024-03-02 MED ORDER — LANTUS SOLOSTAR 100 UNIT/ML ~~LOC~~ SOPN: 6.0000 [IU] | PEN_INJECTOR | Freq: Every day | SUBCUTANEOUS | 0 refills | Status: AC
